# Patient Record
Sex: Male | Born: 1940 | Race: White | Hispanic: No | Marital: Married | Smoking: Never smoker
Health system: Southern US, Community
[De-identification: ages and names within clinical notes are randomized; demographics above are authoritative.]

## PROBLEM LIST (undated history)

## (undated) DIAGNOSIS — N419 Inflammatory disease of prostate, unspecified: Secondary | ICD-10-CM

## (undated) DIAGNOSIS — N323 Diverticulum of bladder: Secondary | ICD-10-CM

## (undated) DIAGNOSIS — C439 Malignant melanoma of skin, unspecified: Secondary | ICD-10-CM

## (undated) DIAGNOSIS — IMO0001 Reserved for inherently not codable concepts without codable children: Secondary | ICD-10-CM

## (undated) DIAGNOSIS — E119 Type 2 diabetes mellitus without complications: Secondary | ICD-10-CM

## (undated) DIAGNOSIS — C801 Malignant (primary) neoplasm, unspecified: Secondary | ICD-10-CM

## (undated) DIAGNOSIS — N486 Induration penis plastica: Secondary | ICD-10-CM

## (undated) DIAGNOSIS — G250 Essential tremor: Secondary | ICD-10-CM

## (undated) DIAGNOSIS — Z794 Long term (current) use of insulin: Secondary | ICD-10-CM

## (undated) DIAGNOSIS — E785 Hyperlipidemia, unspecified: Secondary | ICD-10-CM

## (undated) DIAGNOSIS — I2510 Atherosclerotic heart disease of native coronary artery without angina pectoris: Secondary | ICD-10-CM

## (undated) HISTORY — DX: Hyperlipidemia, unspecified: E78.5

## (undated) HISTORY — DX: Atherosclerotic heart disease of native coronary artery without angina pectoris: I25.10

## (undated) HISTORY — DX: Diverticulum of bladder: N32.3

## (undated) HISTORY — DX: Type 2 diabetes mellitus without complications: E11.9

## (undated) HISTORY — DX: Reserved for inherently not codable concepts without codable children: IMO0001

## (undated) HISTORY — DX: Inflammatory disease of prostate, unspecified: N41.9

## (undated) HISTORY — DX: Long term (current) use of insulin: Z79.4

## (undated) HISTORY — DX: Induration penis plastica: N48.6

## (undated) HISTORY — DX: Essential tremor: G25.0

---

## 1971-12-12 HISTORY — PX: CHOLECYSTECTOMY: SHX55

## 2000-04-30 ENCOUNTER — Encounter: Payer: Self-pay | Admitting: Cardiothoracic Surgery

## 2000-05-01 ENCOUNTER — Encounter: Payer: Self-pay | Admitting: Cardiothoracic Surgery

## 2000-05-01 ENCOUNTER — Inpatient Hospital Stay (HOSPITAL_COMMUNITY): Admission: RE | Admit: 2000-05-01 | Discharge: 2000-05-08 | Payer: Self-pay | Admitting: Cardiothoracic Surgery

## 2000-05-02 ENCOUNTER — Encounter: Payer: Self-pay | Admitting: Cardiothoracic Surgery

## 2000-05-03 ENCOUNTER — Encounter: Payer: Self-pay | Admitting: Cardiothoracic Surgery

## 2000-05-04 ENCOUNTER — Encounter: Payer: Self-pay | Admitting: Cardiothoracic Surgery

## 2000-05-07 ENCOUNTER — Encounter: Payer: Self-pay | Admitting: Cardiothoracic Surgery

## 2006-10-12 ENCOUNTER — Ambulatory Visit: Payer: Self-pay | Admitting: Internal Medicine

## 2006-11-12 ENCOUNTER — Ambulatory Visit: Payer: Self-pay | Admitting: Urology

## 2007-05-28 ENCOUNTER — Ambulatory Visit: Payer: Self-pay | Admitting: Urology

## 2009-12-11 LAB — HM DIABETES EYE EXAM

## 2009-12-31 ENCOUNTER — Ambulatory Visit: Payer: Self-pay | Admitting: Urology

## 2010-04-10 HISTORY — PX: CORONARY ARTERY BYPASS GRAFT: SHX141

## 2010-06-15 ENCOUNTER — Ambulatory Visit: Payer: Self-pay | Admitting: Urology

## 2010-06-28 ENCOUNTER — Ambulatory Visit: Payer: Self-pay | Admitting: Urology

## 2010-06-30 LAB — PATHOLOGY REPORT

## 2011-03-15 ENCOUNTER — Ambulatory Visit: Payer: Self-pay | Admitting: Urology

## 2011-03-21 ENCOUNTER — Ambulatory Visit: Payer: Self-pay | Admitting: Urology

## 2011-08-23 ENCOUNTER — Other Ambulatory Visit (INDEPENDENT_AMBULATORY_CARE_PROVIDER_SITE_OTHER): Payer: Medicare Other

## 2011-08-23 ENCOUNTER — Ambulatory Visit (INDEPENDENT_AMBULATORY_CARE_PROVIDER_SITE_OTHER): Payer: BLUE CROSS/BLUE SHIELD | Admitting: Endocrinology

## 2011-08-23 ENCOUNTER — Encounter: Payer: Self-pay | Admitting: Endocrinology

## 2011-08-23 VITALS — BP 112/60 | HR 57 | Temp 97.5°F | Ht 71.5 in | Wt 182.0 lb

## 2011-08-23 DIAGNOSIS — E119 Type 2 diabetes mellitus without complications: Secondary | ICD-10-CM

## 2011-08-23 DIAGNOSIS — N529 Male erectile dysfunction, unspecified: Secondary | ICD-10-CM

## 2011-08-23 DIAGNOSIS — Z79899 Other long term (current) drug therapy: Secondary | ICD-10-CM

## 2011-08-23 DIAGNOSIS — E7800 Pure hypercholesterolemia, unspecified: Secondary | ICD-10-CM

## 2011-08-23 DIAGNOSIS — E118 Type 2 diabetes mellitus with unspecified complications: Secondary | ICD-10-CM | POA: Insufficient documentation

## 2011-08-23 DIAGNOSIS — E785 Hyperlipidemia, unspecified: Secondary | ICD-10-CM | POA: Insufficient documentation

## 2011-08-23 LAB — HEPATIC FUNCTION PANEL
ALT: 24 U/L (ref 0–53)
Albumin: 4.2 g/dL (ref 3.5–5.2)
Bilirubin, Direct: 0.1 mg/dL (ref 0.0–0.3)
Total Protein: 6.7 g/dL (ref 6.0–8.3)

## 2011-08-23 LAB — LIPID PANEL
Cholesterol: 166 mg/dL (ref 0–200)
HDL: 45.8 mg/dL (ref >39.00)
LDL Cholesterol: 95 mg/dL (ref 0–99)
Triglycerides: 128 mg/dL (ref 0.0–149.0)
VLDL: 25.6 mg/dL (ref 0.0–40.0)

## 2011-08-23 LAB — BASIC METABOLIC PANEL
BUN: 24 mg/dL — ABNORMAL HIGH (ref 6–23)
Calcium: 9.1 mg/dL (ref 8.4–10.5)
Creatinine, Ser: 1 mg/dL (ref 0.4–1.5)
GFR: 80.14 mL/min (ref >60.00)

## 2011-08-23 LAB — MICROALBUMIN / CREATININE URINE RATIO
Creatinine,U: 103.1 mg/dL
Microalb Creat Ratio: 1.1 mg/g (ref 0.0–30.0)
Microalb, Ur: 1.1 mg/dL (ref 0.0–1.9)

## 2011-08-23 LAB — HEMOGLOBIN A1C: Hgb A1c MFr Bld: 6.9 % — ABNORMAL HIGH (ref 4.6–6.5)

## 2011-08-23 NOTE — Progress Notes (Signed)
Subjective:    Patient ID: [name] [name], male    DOB: 02-06-1941, 70 y.o.   MRN: [id number]  HPI pt states 28 years h/o dm, complicated by cad.  he has been on insulin since dx.  he takes a varying dosage of nph insulin.  He says cbg's are approx 90 units in am.  He says he last had a1c was 2 years ago.  He has a renewed interest in getting cbg better, in view of a recurrent bladder diverticulum.  pt says his diet and exercise are very good, but he has otherwise paid less attention to his diabetes recently.   He reports few mos of slight fullness sensation at the bladder area, but no assoc hematuria. He stopped lipitor, due to myalgias. Past Medical History  Diagnosis Date  . Insulin dependent diabetes mellitus   . Diabetes mellitus   . Arteriosclerotic coronary artery disease   . Hyperlipidemia   . Chronic lower back pain   . Peyronie's disease   . Prostatitis   . Benign essential tremor     Past Surgical History  Procedure Date  . Cholecystectomy 1973  . 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 Marital Status: Married    Spouse Name: N/A    Number of Children: N/A  . Years of Education: 16   Occupational History  . Merchant    Social History Main Topics  . Smoking status: Never Smoker   . Smokeless tobacco: Not on file  . Alcohol Use: Yes  . Drug Use: No  . Sexually Active: Not on file   Other Topics Concern  . Not on file   Social History Narrative   Regular exercise-yes    No current outpatient prescriptions on file prior to visit.    Allergies  Allergen Reactions  . Codeine     Family History  Problem Relation Age of Onset  . Arthritis Mother   . Heart disease Mother   . Heart attack Mother   . Diabetes Sister   . Heart disease Brother   . Diabetes Brother     BP 112/60  Pulse 57  Temp(Src) 97.5 F (36.4 C) (Oral)  Ht 5' 11.5" (1.816 m)  Wt 182 lb (82.555 kg)  BMI 25.03 kg/m2  SpO2  97%    Review of Systems denies weight loss, blurry vision, headache, chest pain, sob, n/v, cramps, excessive diaphoresis, memory loss, depression, hypoglycemia, rhinorrhea, and easy bruising.  He reports erectile dysfunction.     Objective:   Physical Exam VS: see vs page GEN: no distress HEAD: head: no deformity eyes: no periorbital swelling, no proptosis external nose and ears are normal mouth: no lesion seen NECK: supple, thyroid is not enlarged CHEST WALL: no deformity.  Healed median sternotomy scar. LUNGS: clear to auscultation CV: reg rate and rhythm, no murmur ABD: abdomen is soft, nontender.  no hepatosplenomegaly.  not distended.  no hernia MUSCULOSKELETAL: muscle bulk and strength are grossly normal.  no obvious joint swelling.  gait is normal and steady EXTEMITIES: no deformity.  no ulcer on the feet.  feet are of normal color and temp.  1+ leg edema on the left, and trace on the right.  There are bilat leg varicosities. PULSES: dorsalis pedis intact bilat.  no carotid bruit NEURO:  cn 2-12 grossly intact.   readily moves all 4's.  sensation is intact to touch on the  feet SKIN:  Normal texture and temperature.  No rash or suspicious lesion is visible.   NODES:  None palpable at the neck PSYCH: alert, oriented x3.  Does not appear anxious nor depressed.  Lab Results  Component Value Date   CHOL 166 08/23/2011   Lab Results  Component Value Date   HDL 45.80 08/23/2011   Lab Results  Component Value Date   LDLCALC 95 08/23/2011   Lab Results  Component Value Date   TRIG 128.0 08/23/2011   Lab Results  Component Value Date   CHOLHDL 4 08/23/2011    Lab Results  Component Value Date   TESTOSTERONE 313.66* 08/23/2011      Assessment & Plan:  Dm, overcontrolled Cad: due to this, risk to his health from hypoglycemia is severe Dyslipidemia, therapy is limited by perceived drug intolerance Hypogonadism, mild Erectile dysfunction, prob not due to  hypogonadism Fullness sensation at the bladder area, uncertain etiology

## 2011-08-23 NOTE — Patient Instructions (Addendum)
good diet and exercise habits significanly improve the control of your diabetes.  please let me know if you wish to be referred to a dietician.  high blood sugar is very risky to your health.  you should see an eye doctor every year. controlling your blood pressure and cholesterol drastically reduces the damage diabetes does to your body.  this also applies to quitting smoking.  please discuss these with your doctor.  you should take an aspirin every day, unless you have been advised by a doctor not to. check your blood sugar 2 times a day.  vary the time of day when you check, between before the 3 meals, and at bedtime.  also check if you have symptoms of your blood sugar being too high or too low.  please keep a record of the readings and bring it to your next appointment here.  please call us sooner if you are having low blood sugar episodes.   we will need to take this complex situation in stages.  blood tests are being requested for you today.  please call [phone] to hear your test results.  You will be prompted to enter the 9-digit "MRN" number that appears at the top left of this page, followed by #.  Then you will hear the message. pending the test results, please take your insulin 25 units each morning. Please see dr lamb soon, to follow-up on you health. Please make a follow-up appointment in 2-3 weeks (update: i left message on phone-tree:  Reduce insulin to 20 units qd.  Despite pretty good LDL, you should take statin rx.  Call if you want levitra rx).

## 2011-09-04 ENCOUNTER — Ambulatory Visit: Payer: Self-pay | Admitting: Urology

## 2011-09-06 ENCOUNTER — Encounter: Payer: Self-pay | Admitting: Endocrinology

## 2011-09-06 ENCOUNTER — Ambulatory Visit (INDEPENDENT_AMBULATORY_CARE_PROVIDER_SITE_OTHER): Payer: Medicare Other | Admitting: Endocrinology

## 2011-09-06 DIAGNOSIS — E109 Type 1 diabetes mellitus without complications: Secondary | ICD-10-CM

## 2011-09-06 MED ORDER — INSULIN NPH ISOPHANE & REGULAR (70-30) 100 UNIT/ML ~~LOC~~ SUSP: SUBCUTANEOUS | Status: DC

## 2011-09-06 NOTE — Patient Instructions (Addendum)
check your blood sugar 2 times a day.  vary the time of day when you check, between before the 3 meals, and at bedtime.  also check if you have symptoms of your blood sugar being too high or too low.  please keep a record of the readings and bring it to your next appointment here.  please call us sooner if you are having low blood sugar episodes.   Please make a follow-up appointment in 1 month. chenge nph insulin to 70/30 insulin: 20 units with breakfast, and 5 with the evening meal.  However, if you are going to be active, take just 15 units in the morning.

## 2011-09-06 NOTE — Progress Notes (Signed)
Subjective:    Patient ID: [name] [name], male    DOB: 1941/03/25, 70 y.o.   MRN: [id number]  HPI Pt often exceeds the prescribed insulin dosage (he often takes 25 units qam, and 10 qpm).  he brings a record of his cbg's which i have reviewed today.  It varies from 74-210.  It is in general higher as the day goes on.  He says he will take insulin as often as bid only.    Past Medical History  Diagnosis Date  . Insulin dependent diabetes mellitus   . Arteriosclerotic coronary artery disease   . Chronic lower back pain   . Peyronie's disease   . Prostatitis   . Benign essential tremor   . Hyperlipidemia   . Diabetes mellitus     Past Surgical History  Procedure Date  . Cholecystectomy 1973  . 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 Marital Status: Married    Spouse Name: N/A    Number of Children: N/A  . Years of Education: 16   Occupational History  . Merchant    Social History Main Topics  . Smoking status: Never Smoker   . Smokeless tobacco: Not on file  . Alcohol Use: Yes  . Drug Use: No  . Sexually Active: Not on file   Other Topics Concern  . Not on file   Social History Narrative   Regular exercise-yes    Current Outpatient Prescriptions on File Prior to Visit  Medication Sig Dispense Refill  . Acetylcarn-Alpha Lipoic Acid 400-200 MG CAPS Take 1 capsule by mouth daily.        . Bilberry, Vaccinium myrtillus, (BILBERRY PO) Take 1 capsule by mouth 2 (two) times daily.        . Biotin 1000 MCG tablet Take 1,000 mcg by mouth 2 (two) times daily.        . Carnosine L POWD by Does not apply route. 500mg   Once daily       . cholecalciferol (VITAMIN D) 1000 UNITS tablet Take 1,000 Units by mouth daily.        . Coenzyme Q10 (EQL COQ10) 300 MG CAPS Take 1 capsule by mouth daily.        . Cranberry (SM CRANBERRY) 300 MG tablet Take 300 mg by mouth 2 (two) times daily.        . Cyanocobalamin (B-12) 100 MCG TABS  Take 1 tablet by mouth daily.        . Digestive Enzymes (PAPAYA ENZYME PO) Take 1 capsule by mouth 2 (two) times daily.        . Evening Primrose Oil 1000 MG CAPS Take 1 capsule by mouth daily.        Marland Kitchen FENUGREEK PO Take 1 capsule by mouth daily. 610mg        . Ginger, Zingiber officinalis, (GINGER ROOT) 550 MG CAPS Take 1 capsule by mouth 2 (two) times daily.        . Glucosamine-Chondroit-Vit C-Mn (GLUCOSAMINE 1500 COMPLEX) CAPS Take 1 capsule by mouth daily.        . Glutamine 500 MG CAPS Take 1 capsule by mouth daily.        . Glutathione-L POWD 250mg   Once daily       . Hyaluronic Acid 400-80-40 MG CAPS Take 1 capsule by mouth 2 (two) times daily. 1000mg        . [place] Lanius  300 MG CAPS Take 1 capsule by mouth 2 (two) times daily.        . Magnesium 400 MG CAPS Take 1 capsule by mouth 2 (two) times daily.        . milk thistle 175 MG tablet Take 175 mg by mouth daily.        . NON FORMULARY Tumeric  500mg  1 by mouth daily       . Nutritional Supplements (MELATONIN PO) Take 1 capsule by mouth daily.        . primidone (MYSOLINE) 50 MG tablet 25 mg by mouth daily       . valsartan-hydrochlorothiazide (DIOVAN-HCT) 80-12.5 MG per tablet Take 1 tablet by mouth daily.          Allergies  Allergen Reactions  . Codeine     Family History  Problem Relation Age of Onset  . Arthritis Mother   . Heart disease Mother   . Heart attack Mother   . Diabetes Sister   . Heart disease Brother   . Diabetes Brother     BP 108/52  Pulse 60  Temp(Src) 97.8 F (36.6 C) (Oral)  Ht 5' 11.5" (1.816 m)  Wt 183 lb 6.4 oz (83.19 kg)  BMI 25.22 kg/m2  SpO2 96% Review of Systems denies hypoglycemia    Objective:   Physical Exam VITAL SIGNS:  See vs page GENERAL: no distress SKIN:  Insulin injection sites at the anterior abdomen are normal     Assessment & Plan:  Dm.  The pattern of his cbg's indicates he needs some adjustment in his therapy. therapy limited by pt's request for a simple  insulin regimen

## 2011-09-13 ENCOUNTER — Inpatient Hospital Stay: Payer: Self-pay | Admitting: Urology

## 2011-10-11 ENCOUNTER — Ambulatory Visit: Payer: Medicare Other | Admitting: Endocrinology

## 2012-06-26 ENCOUNTER — Ambulatory Visit: Payer: Self-pay | Admitting: Orthopedic Surgery

## 2013-10-23 ENCOUNTER — Ambulatory Visit: Payer: Self-pay | Admitting: Gastroenterology

## 2013-11-21 ENCOUNTER — Ambulatory Visit: Payer: Self-pay | Admitting: Urology

## 2013-11-24 ENCOUNTER — Ambulatory Visit: Payer: Self-pay | Admitting: Urology

## 2013-12-01 ENCOUNTER — Ambulatory Visit: Payer: Self-pay | Admitting: Gastroenterology

## 2013-12-18 ENCOUNTER — Ambulatory Visit: Payer: Self-pay | Admitting: Urology

## 2013-12-26 ENCOUNTER — Ambulatory Visit: Payer: Self-pay | Admitting: Urology

## 2014-04-29 ENCOUNTER — Ambulatory Visit: Payer: Self-pay | Admitting: Urology

## 2014-10-02 DIAGNOSIS — I779 Disorder of arteries and arterioles, unspecified: Secondary | ICD-10-CM | POA: Insufficient documentation

## 2014-10-02 DIAGNOSIS — I739 Peripheral vascular disease, unspecified: Secondary | ICD-10-CM

## 2015-03-23 ENCOUNTER — Encounter
Admit: 2015-03-23 | Disposition: A | Discharge status: Home / Self Care | Payer: Self-pay | Attending: Surgery | Admitting: Surgery

## 2015-04-20 ENCOUNTER — Encounter: Payer: Medicare Other | Attending: Surgery | Admitting: Surgery

## 2015-04-20 DIAGNOSIS — L97421 Non-pressure chronic ulcer of left heel and midfoot limited to breakdown of skin: Secondary | ICD-10-CM | POA: Diagnosis present

## 2015-04-20 DIAGNOSIS — E11621 Type 2 diabetes mellitus with foot ulcer: Secondary | ICD-10-CM | POA: Diagnosis not present

## 2015-04-20 NOTE — Progress Notes (Signed)
TZION, WEDEL ([id number]) Visit Report for 04/20/2015 Chief Complaint Document Details Patient Name: [name] [name], [name] [name] Date of Service: 04/20/2015 8:45 AM Medical Record Number: [id number] Patient Account Number: [id number] Date of Birth/Sex: 07-23-1941 (74 y.o. Male) Treating RN: Junious Dresser Primary Care Physician: Apolonio Schneiders Other Clinician: Referring Physician: Apolonio Schneiders Treating Physician/Extender: Frann Rider in Treatment: 4 Information Obtained from: Patient Chief Complaint Patient presents to the wound care center for a consult due non healing wound pleasant 74 year old gentleman who comes with a ulcerated area on the left lateral heel and he's had it for about a month. Electronic Signature(s) Signed: 04/20/2015 2:25:04 PM By: Christin Fudge MD, FACS Entered By: Christin Fudge on 04/20/2015 09:30:01 [name] ([id number]) -------------------------------------------------------------------------------- Debridement Details Patient Name: [name] Date of Service: 04/20/2015 8:45 AM Medical Record Number: [id number] Patient Account Number: [id number] Date of Birth/Sex: Oct 28, 1941 (74 y.o. Male) Treating RN: Junious Dresser Primary Care Physician: Apolonio Schneiders Other Clinician: Referring Physician: Apolonio Schneiders Treating Physician/Extender: Frann Rider in Treatment: 4 Debridement Performed for Wound #1 Left,Lateral Calcaneous Assessment: Performed By: Physician Pat Patrick., MD Debridement: Open Wound/Selective Debridement Selective Description: Pre-procedure Yes Verification/Time Out Taken: Start Time: 09:23 Pain Control: Lidocaine 4% Topical Solution Level: Non-Viable Tissue Total Area Debrided (L x 1.3 (cm) x 0.2 (cm) = 0.26 (cm) W): Tissue and other Non-Viable, Eschar, Fibrin/Slough, Skin material debrided: Instrument: Forceps, Scissors Bleeding: None End Time: 09:25 Procedural Pain: 0 Post Procedural Pain: 0 Response to  Treatment: Procedure was tolerated well Post Debridement Measurements of Total Wound Length: (cm) 1.3 Width: (cm) 0.2 Depth: (cm) 0.1 Volume: (cm) 0.02 Electronic Signature(s) Signed: 04/20/2015 2:25:04 PM By: Christin Fudge MD, FACS Signed: 04/20/2015 4:31:45 PM By: Junious Dresser RN Entered By: Christin Fudge on 04/20/2015 09:29:51 [name] ([id number]) -------------------------------------------------------------------------------- HPI Details Patient Name: [name] Date of Service: 04/20/2015 8:45 AM Medical Record Number: [id number] Patient Account Number: [id number] Date of Birth/Sex: 1941/10/11 (74 y.o. Male) Treating RN: Junious Dresser Primary Care Physician: Apolonio Schneiders Other Clinician: Referring Physician: Apolonio Schneiders Treating Physician/Extender: Frann Rider in Treatment: 4 History of Present Illness HPI Description: 74 year old gentleman who comes as a self-referral and has been known to be a diabetic on treatment for the last 30 years and takes insulin for this. Developed a callus on the left lateral heel and does not know exactly what type of footwear cause this. Did see his dermatologist but prescribed some medication for him and also applied some Bactroban cream. Able to soften this area and remove some of the dead skin but he now has a definite ulcer there and knowing he's been a diabetic he came as a self-referral for further treatment. He's been a nonsmoker all his life and hasn't had no problems with any arterial disease and has no workup done for this. 04/06/2015 -- he has been doing his dressing on alternate days but sometimes he runs that the dressing doesn't stay on for long. He also informs me that he is going to be out on vacation this weekend and will be back only in 2 weeks. Electronic Signature(s) Signed: 04/20/2015 2:25:04 PM By: Christin Fudge MD, FACS Entered By: Christin Fudge on 04/20/2015 09:30:07 [name]  ([id number]) -------------------------------------------------------------------------------- Physical Exam Details Patient Name: [name] Date of Service: 04/20/2015 8:45 AM Medical Record Number: [id number] Patient Account Number: [id number] Date of Birth/Sex: 11/11/1941 (74 y.o. Male) Treating RN: Junious Dresser Primary Care Physician: Apolonio Schneiders Other Clinician: Referring Physician: Apolonio Schneiders  Treating Physician/Extender: Christin Fudge Weeks in Treatment: 4 Constitutional . Pulse regular. Respirations normal and unlabored. Afebrile. . Eyes Nonicteric. Reactive to light. Ears, Nose, Mouth, and Throat Lips, teeth, and gums WNL.Marland Kitchen Moist mucosa without lesions . Neck supple and nontender. No palpable supraclavicular or cervical adenopathy. Normal sized without goiter. Respiratory WNL. No retractions.. Cardiovascular Pedal Pulses WNL. No clubbing, cyanosis or edema. Integumentary (Hair, Skin) the ulceration is a epithelializing nicely but he's got a little bit of maceration and some dead skin and callus around it which will be sharply debrided today.Marland Kitchen No crepitus or fluctuance. No peri-wound warmth or erythema. No masses.Marland Kitchen Psychiatric Judgement and insight Intact.. No evidence of depression, anxiety, or agitation.. Electronic Signature(s) Signed: 04/20/2015 2:25:04 PM By: Christin Fudge MD, FACS Entered By: Christin Fudge on 04/20/2015 09:31:37 [name] ([id number]) -------------------------------------------------------------------------------- Physician Orders Details Patient Name: [name] Date of Service: 04/20/2015 8:45 AM Medical Record Number: [id number] Patient Account Number: [id number] Date of Birth/Sex: 1941-07-31 (74 y.o. Male) Treating RN: Junious Dresser Primary Care Physician: Apolonio Schneiders Other Clinician: Referring Physician: Apolonio Schneiders Treating Physician/Extender: Frann Rider in Treatment: 4 Verbal / Phone Orders: Yes Clinician:  Junious Dresser Read Back and Verified: Yes Diagnosis Coding Wound Cleansing Wound #1 Left,Lateral Calcaneous o Clean wound with Normal Saline. Anesthetic Wound #1 Left,Lateral Calcaneous o Topical Lidocaine 4% cream applied to wound bed prior to debridement Primary Wound Dressing Wound #1 Left,Lateral Calcaneous o Aquacel Ag Secondary Dressing Wound #1 Left,Lateral Calcaneous o Gauze and Kerlix/Conform Dressing Change Frequency Wound #1 Left,Lateral Calcaneous o Change dressing every other day. Follow-up Appointments Wound #1 Left,Lateral Calcaneous o Return Appointment in 1 week. Electronic Signature(s) Signed: 04/20/2015 2:25:04 PM By: Christin Fudge MD, FACS Signed: 04/20/2015 4:31:45 PM By: Junious Dresser RN Entered By: Junious Dresser on 04/20/2015 09:23:11 [name] ([id number]) -------------------------------------------------------------------------------- Problem List Details Patient Name: [name] Date of Service: 04/20/2015 8:45 AM Medical Record Number: [id number] Patient Account Number: [id number] Date of Birth/Sex: 09-06-1941 (74 y.o. 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 Male) Treating RN: Junious Dresser Primary Care Physician: Apolonio Schneiders Other Clinician: Referring Physician: Apolonio Schneiders Treating Physician/Extender: Frann Rider in Treatment: 4 Subjective Chief Complaint Information obtained from Patient Patient presents to the wound care center for a consult due non healing wound pleasant 74 year old gentleman who comes with a ulcerated area on the left lateral heel and he's had it for about a month. History of Present Illness (HPI) 74 year old gentleman who comes as a self-referral and has been known to be a diabetic on treatment for the last 30 years and takes insulin for this. Developed a callus on the left lateral heel and does not know exactly what type of footwear cause this. Did see his dermatologist but prescribed some medication for him and also applied some Bactroban cream. Able to soften this area and remove some  of the dead skin but he now has a definite ulcer there and knowing he's been a diabetic he came as a self-referral for further treatment. He's been a nonsmoker all his life and hasn't had no problems with any arterial disease and has no workup done for this. 04/06/2015 -- he has been doing his dressing on alternate days but sometimes he runs that the dressing doesn't stay on for long. He also informs me that he is going to be out on vacation this weekend and will be back only in 2 weeks. Objective Constitutional Pulse regular. Respirations normal and unlabored. Afebrile. Vitals Time Taken: 9:10 AM, Height: 71 in, Weight: 180 lbs, BMI: 25.1, Temperature: 97.3 F, Pulse: 60 bpm, Respiratory Rate: 20 breaths/min, Blood Pressure: 109/57 mmHg. Eyes Nonicteric. Reactive to light. Ears, Nose, Mouth, and Throat Lips, teeth, and gums WNL.Marland Kitchen Moist mucosa without lesions . YERICK, EGGEBRECHT. ([id number]) Neck supple and nontender. No palpable  supraclavicular or cervical adenopathy. Normal sized without goiter. Respiratory WNL. No retractions.. Cardiovascular Pedal Pulses WNL. No clubbing, cyanosis or edema. Psychiatric Judgement and insight Intact.. No evidence of depression, anxiety, or agitation.. Integumentary (Hair, Skin) the ulceration is a epithelializing nicely but he's got a little bit of maceration and some dead skin and callus around it which will be sharply debrided today.Marland Kitchen No crepitus or fluctuance. No peri-wound warmth or erythema. No masses.. Wound #1 status is Open. Original cause of wound was Gradually Appeared. The wound is located on the Left,Lateral Calcaneous. The wound measures 1.3cm length x 0.2cm width x 0.1cm depth; 0.204cm^2 area and 0.02cm^3 volume. The wound is limited to skin breakdown. There is no tunneling or undermining noted. There is a small amount of serous drainage noted. The wound margin is flat and intact. There is small (1-33%) red, pink granulation within the wound bed. There is no necrotic tissue within the wound bed. The periwound skin appearance exhibited: Callus, Maceration. The periwound skin appearance did not exhibit: Crepitus, Excoriation, Fluctuance, Friable, Induration, Localized Edema, Rash, Scarring, Dry/Scaly, Moist, Atrophie Blanche, Cyanosis, Ecchymosis, Hemosiderin Staining, Mottled, Pallor, Rubor, Erythema. Periwound temperature was noted as No Abnormality. 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 ([id number]) I'm seeing him back after 2 weeks due to his vacation and he has done very well. The wound is progressing nicely and there is some healthy granulation tissue.  The callous and debris is minimal and this was sharply debrided today. I will continue with silver alginate dressing on alternate days and offloading as he has been doing. He will come back and see me next week. Procedures Wound #1 Wound #1 is a Diabetic Wound/Ulcer of the Lower Extremity located on the Left,Lateral Calcaneous . There was a Non-Viable Tissue Open Wound/Selective [phone]) debridement with total area of 0.26 sq cm performed by Tashay Bozich, Jackson Latino., MD. with the following instrument(s): Forceps and Scissors to remove Non- Viable tissue/material including Fibrin/Slough, Eschar, and Skin after achieving pain control using Lidocaine 4% Topical Solution. A time out was conducted prior to the start of the procedure. There was no bleeding. The procedure was tolerated well with a pain level of 0 throughout and a pain level of 0 following the procedure. Post Debridement Measurements: 1.3cm length x 0.2cm  width x 0.1cm depth; 0.02cm^3 volume. Plan Wound Cleansing: Wound #1 Left,Lateral Calcaneous: Clean wound with Normal Saline. Anesthetic: Wound #1 Left,Lateral Calcaneous: Topical Lidocaine 4% cream applied to wound bed prior to debridement Primary Wound Dressing: Wound #1 Left,Lateral Calcaneous: Aquacel Ag Secondary Dressing: Wound #1 Left,Lateral Calcaneous: Gauze and Kerlix/Conform Dressing Change Frequency: Wound #1 Left,Lateral Calcaneous: Change dressing every other day. Follow-up Appointments: Wound #1 Left,Lateral Calcaneous: Return Appointment in 1 week. Follow-Up Appointments: A Patient Clinical Summary of Care was provided to [facility]. ([id number]) I'm seeing him back after 2 weeks due to his vacation and he has done very well. The wound is progressing nicely and there is some healthy granulation tissue. The callous and debris is minimal and this was sharply debrided today. I will continue with silver alginate dressing on alternate days and offloading  as he has been doing. He will come back and see me next week. Electronic Signature(s) Signed: 04/20/2015 1:31:30 PM By: Junious Dresser RN Signed: 04/20/2015 2:25:04 PM By: Christin Fudge MD, FACS Entered By: Junious Dresser on 04/20/2015 13:31:30 [name] ([id number]) -------------------------------------------------------------------------------- SuperBill Details Patient Name: [name] Date of Service: 04/20/2015 Medical Record Number: [id number] Patient Account Number: [id number] Date of Birth/Sex: [birth year] (74 y.o. 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

## 2015-04-20 NOTE — Progress Notes (Signed)
[name] [name] ([id number]) Visit Report for 04/20/2015 Arrival Information Details Patient Name: [name] [name] Date of Service: 04/20/2015 8:45 AM Medical Record Number: [id number] Patient Account Number: [id number] Date of Birth/Sex: May 30, 1941 (74 y.o. Male) Treating RN: Junious Dresser Primary Care Physician: Apolonio Schneiders Other Clinician: Referring Physician: Apolonio Schneiders Treating Physician/Extender: Frann Rider in Treatment: 4 Visit Information History Since Last Visit Added or deleted any medications: No Patient Arrived: Ambulatory Any new allergies or adverse reactions: No Arrival Time: 09:12 Had a fall or experienced change in No Accompanied By: self activities of daily living that may affect Transfer Assistance: None risk of falls: Patient Identification Verified: Yes Signs or symptoms of abuse/neglect since last No Secondary Verification Process Yes visito Completed: Hospitalized since last visit: No Patient Has Alerts: Yes Has Dressing in Place as Prescribed: Yes Patient Alerts: Type II Diabetic Pain Present Now: No AIC 6.2 ABI L 1.20 R 1.12 Electronic Signature(s) Signed: 04/20/2015 4:31:45 PM By: Junious Dresser RN Entered By: Junious Dresser on 04/20/2015 09:12:56 [name] [name] ([id number]) -------------------------------------------------------------------------------- Encounter Discharge Information Details Patient Name: [name] [name] Date of Service: 04/20/2015 8:45 AM Medical Record Number: [id number] Patient Account Number: [id number] Date of Birth/Sex: August 29, 1941 (74 y.o. Male) Treating RN: Junious Dresser Primary Care Physician: Apolonio Schneiders Other Clinician: Referring Physician: Apolonio Schneiders Treating Physician/Extender: Frann Rider in Treatment: 4 Encounter Discharge Information Items Schedule Follow-up Appointment: No Medication Reconciliation completed No and provided to Patient/Care [name] [name]: Provided on Clinical Summary of  Care: 04/20/2015 Form Type Recipient Paper Patient RO Electronic Signature(s) Signed: 04/20/2015 9:33:43 AM By: Ruthine Dose Entered By: Ruthine Dose on 04/20/2015 09:33:43 [name] [name] ([id number]) -------------------------------------------------------------------------------- Lower Extremity Assessment Details Patient Name: [name] [name] Date of Service: 04/20/2015 8:45 AM Medical Record Number: [id number] Patient Account Number: [id number] Date of Birth/Sex: 13-Oct-1941 (74 y.o. Male) Treating RN: Junious Dresser Primary Care Physician: Apolonio Schneiders Other Clinician: Referring Physician: Apolonio Schneiders Treating Physician/Extender: Frann Rider in Treatment: 4 Vascular Assessment Claudication: Claudication Assessment [Left:None] Pulses: Posterior Tibial Palpable: [Left:Yes] Dorsalis Pedis Palpable: [Left:Yes] Extremity colors, hair growth, and conditions: Extremity Color: [Left:Normal] Hair Growth on Extremity: [Left:No] Temperature of Extremity: [Left:Warm] Capillary Refill: [Left:< 3 seconds] Dependent Rubor: [Left:No] Blanched when Elevated: [Left:No] Toe Nail Assessment Left: Right: Thick: Yes Discolored: Yes Deformed: Yes Improper Length and Hygiene: No Electronic Signature(s) Signed: 04/20/2015 4:31:45 PM By: Junious Dresser RN Entered By: Junious Dresser on 04/20/2015 09:16:35 [name] [name] ([id number]) -------------------------------------------------------------------------------- Multi Wound Chart Details Patient Name: [name] [name] Date of Service: 04/20/2015 8:45 AM Medical Record Number: [id number] Patient Account Number: [id number] Date of Birth/Sex: 03/07/1941 (74 y.o. Male) Treating RN: Junious Dresser Primary Care Physician: Apolonio Schneiders Other Clinician: Referring Physician: Apolonio Schneiders Treating Physician/Extender: Frann Rider in Treatment: 4 Vital Signs Height(in): 71 Pulse(bpm): 60 Weight(lbs): 180 Blood  Pressure 109/57 (mmHg): Body Mass Index(BMI): 25 Temperature(F): 97.3 Respiratory Rate 20 (breaths/min): Photos: [1:No Photos] [N/A:N/A] Wound Location: [1:Left Calcaneous - Lateral] [N/A:N/A] Wounding Event: [1:Gradually Appeared] [N/A:N/A] Primary Etiology: [1:Diabetic Wound/Ulcer of the Lower Extremity] [N/A:N/A] Comorbid History: [1:Type II Diabetes] [N/A:N/A] Date Acquired: [1:02/12/2015] [N/A:N/A] Weeks of Treatment: [1:4] [N/A:N/A] Wound Status: [1:Open] [N/A:N/A] Measurements L x W x D 1.3x0.2x0.1 [N/A:N/A] (cm) Area (cm) : [1:0.204] [N/A:N/A] Volume (cm) : [1:0.02] [N/A:N/A] % Reduction in Area: [1:76.80%] [N/A:N/A] % Reduction in Volume: 77.30% [N/A:N/A] Classification: [1:Grade 1] [N/A:N/A] Exudate Amount: [1:Small] [N/A:N/A] Exudate Type: [1:Serous] [N/A:N/A] Exudate Color: [1:amber] [N/A:N/A] Wound Margin: [1:Flat and Intact] [N/A:N/A] Granulation Amount: [1:Small (1-33%)] [N/A:N/A]  Granulation Quality: [1:Red, Pink] [N/A:N/A] Necrotic Amount: [1:None Present (0%)] [N/A:N/A] Exposed Structures: [1:Fascia: No Fat: No Tendon: No Muscle: No Joint: No Bone: No] [N/A:N/A] Limited to Skin Breakdown Epithelialization: Large (67-100%) N/A N/A Periwound Skin Texture: Callus: Yes N/A N/A Edema: No Excoriation: No Induration: No Crepitus: No Fluctuance: No Friable: No Rash: No Scarring: No Periwound Skin Maceration: Yes N/A N/A Moisture: Moist: No Dry/Scaly: No Periwound Skin Color: Atrophie Blanche: No N/A N/A Cyanosis: No Ecchymosis: No Erythema: No Hemosiderin Staining: No Mottled: No Pallor: No Rubor: No Temperature: No Abnormality N/A N/A Tenderness on No N/A N/A Palpation: Wound Preparation: Ulcer Cleansing: N/A N/A Rinsed/Irrigated with Saline Topical Anesthetic Applied: Other: lidocaine 4% Treatment Notes Electronic Signature(s) Signed: 04/20/2015 4:31:45 PM By: Junious Dresser RN Entered By: Junious Dresser on 04/20/2015 09:21:34 [name] [name] ([id number]) -------------------------------------------------------------------------------- [place] Details Patient Name: [name] [name] Date of Service: 04/20/2015 8:45 AM Medical Record Number: [id number] Patient Account Number: [id number] Date of Birth/Sex: [birth year] (74 y.o. Male) Treating RN: Junious Dresser Primary Care Physician: Apolonio Schneiders Other Clinician: Referring Physician: Apolonio Schneiders Treating Physician/Extender: Frann Rider in Treatment: 4 Active Inactive Orientation to the Wound Care Program Nursing Diagnoses: Knowledge deficit related to the wound healing center program Goals: Patient/caregiver will verbalize understanding of the [facility] Date Initiated: 03/23/2015 Goal Status: Active Interventions: Provide education on orientation to the wound center Notes: Wound/Skin Impairment Nursing Diagnoses: Knowledge deficit related to ulceration/compromised skin integrity Goals: Patient/caregiver will verbalize understanding of skin care regimen Date Initiated: 03/23/2015 Goal Status: Active Ulcer/skin breakdown will heal within 14 weeks Date Initiated: 03/23/2015 Goal Status: Active Interventions: Assess patient/caregiver ability to obtain necessary supplies Assess patient/caregiver ability to perform ulcer/skin care regimen upon admission and as needed Assess ulceration(s) every visit Provide education on ulcer and skin care Treatment Activities: Skin care regimen initiated : 04/20/2015 Topical wound management initiated : 04/20/2015 WELLES, WALTHALL ([id number]) Notes: Electronic Signature(s) Signed: 04/20/2015 4:31:45 PM By: Junious Dresser RN Entered By: Junious Dresser on 04/20/2015 09:21:12 [name] [name] ([id number]) -------------------------------------------------------------------------------- Pain Assessment Details Patient Name: [name] [name] Date of Service: 04/20/2015 8:45 AM Medical Record  Number: [id number] Patient Account Number: [id number] Date of Birth/Sex: [birth year] (74 y.o. Male) Treating RN: Junious Dresser Primary Care Physician: Apolonio Schneiders Other Clinician: Referring Physician: Apolonio Schneiders Treating Physician/Extender: Frann Rider in Treatment: 4 Active Problems Location of Pain Severity and Description of Pain Patient Has Paino No Site Locations Pain Management and Medication Current Pain Management: Electronic Signature(s) Signed: 04/20/2015 4:31:45 PM By: Junious Dresser RN Entered By: Junious Dresser on 04/20/2015 09:13:02 [name] [name] ([id number]) -------------------------------------------------------------------------------- Patient/Caregiver Education Details Patient Name: [name] [name] Date of Service: 04/20/2015 8:45 AM Medical Record Number: [id number] Patient Account Number: [id number] Date of Birth/Gender: 1941/01/25 (74 y.o. Male) Treating RN: Junious Dresser Primary Care Physician: Apolonio Schneiders Other Clinician: Referring Physician: Apolonio Schneiders Treating Physician/Extender: Frann Rider in Treatment: 4 Education Assessment Education Provided To: Patient Education Topics Provided Offloading: Methods: Explain/Verbal Responses: State content correctly Pressure: Methods: Explain/Verbal Responses: State content correctly Safety: Methods: Explain/Verbal Responses: State content correctly Wound Debridement: Methods: Explain/Verbal Responses: State content correctly Wound/Skin Impairment: Methods: Explain/Verbal Responses: State content correctly Electronic Signature(s) Signed: 04/20/2015 4:31:45 PM By: Junious Dresser RN Entered By: Junious Dresser on 04/20/2015 09:40:10 [name] [name] ([id number]) -------------------------------------------------------------------------------- Wound Assessment Details Patient Name: [name] [name] Date of Service: 04/20/2015 8:45 AM Medical Record Number: [id number] Patient Account Number:  [id number] Date of Birth/Sex: 01/30/1941 (73  y.o. Male) Treating RN: Junious Dresser Primary Care Physician: Apolonio Schneiders Other Clinician: Referring Physician: Apolonio Schneiders Treating Physician/Extender: Frann Rider in Treatment: 4 Wound Status Wound Number: 1 Primary Diabetic Wound/Ulcer of the Lower Etiology: Extremity Wound Location: Left Calcaneous - Lateral Wound Status: Open Wounding Event: Gradually Appeared Comorbid Type II Diabetes Date Acquired: 02/12/2015 History: Weeks Of Treatment: 4 Clustered Wound: No Photos Photo Uploaded By: Junious Dresser on 04/20/2015 13:40:57 Wound Measurements Length: (cm) 1.3 Width: (cm) 0.2 Depth: (cm) 0.1 Area: (cm) 0.204 Volume: (cm) 0.02 % Reduction in Area: 76.8% % Reduction in Volume: 77.3% Epithelialization: Large (67-100%) Tunneling: No Undermining: No Wound Description Classification: Grade 1 Wound Margin: Flat and Intact Exudate Amount: Small Exudate Type: Serous Exudate Color: amber WILBURT, MESSINA. ([id number]) Foul Odor After Cleansing: No Wound Bed Granulation Amount: Small (1-33%) Exposed Structure Granulation Quality: Red, Pink Fascia Exposed: No Necrotic Amount: None Present (0%) Fat Layer Exposed: No Tendon Exposed: No Muscle Exposed: No Joint Exposed: No Bone Exposed: No Limited to Skin Breakdown Periwound Skin Texture Texture Color No Abnormalities Noted: No No Abnormalities Noted: No Callus: Yes Atrophie Blanche: No Crepitus: No Cyanosis: No Excoriation: No Ecchymosis: No Fluctuance: No Erythema: No Friable: No Hemosiderin Staining: No Induration: No Mottled: No Localized Edema: No Pallor: No Rash: No Rubor: No Scarring: No Temperature / Pain Moisture Temperature: No Abnormality No Abnormalities Noted: No Dry / Scaly: No Maceration: Yes Moist: No Wound Preparation Ulcer Cleansing: Rinsed/Irrigated with Saline Topical Anesthetic Applied: Other: lidocaine 4%, Treatment  Notes Wound #1 (Left, Lateral Calcaneous) 1. Cleansed with: Clean wound with Normal Saline 2. Anesthetic Topical Lidocaine 4% cream to wound bed prior to debridement 4. Dressing Applied: Aquacel Ag 5. Secondary Dressing Applied Gauze and Kerlix/Conform Electronic Signature(s) Signed: 04/20/2015 4:31:45 PM By: Junious Dresser RN Faith Rogue, Kinsley W. ([id number]) Entered By: Junious Dresser on 04/20/2015 09:17:04 [name] [name] ([id number]) -------------------------------------------------------------------------------- Vitals Details Patient Name: [name] [name] Date of Service: 04/20/2015 8:45 AM Medical Record Number: [id number] Patient Account Number: [id number] Date of Birth/Sex: June 21, 1941 (74 y.o. Male) Treating RN: Junious Dresser Primary Care Physician: Apolonio Schneiders Other Clinician: Referring Physician: Apolonio Schneiders Treating Physician/Extender: Frann Rider in Treatment: 4 Vital Signs Time Taken: 09:10 Temperature (F): 97.3 Height (in): 71 Pulse (bpm): 60 Weight (lbs): 180 Respiratory Rate (breaths/min): 20 Body Mass Index (BMI): 25.1 Blood Pressure (mmHg): 109/57 Reference Range: 80 - 120 mg / dl Electronic Signature(s) Signed: 04/20/2015 4:31:45 PM By: Junious Dresser RN Entered By: Junious Dresser on 04/20/2015 09:13:25

## 2015-04-27 ENCOUNTER — Encounter: Payer: Medicare Other | Admitting: Surgery

## 2015-04-27 DIAGNOSIS — L97421 Non-pressure chronic ulcer of left heel and midfoot limited to breakdown of skin: Secondary | ICD-10-CM | POA: Diagnosis not present

## 2015-04-28 NOTE — Progress Notes (Signed)
EDMAN, LIPSEY ([id number]) Visit Report for 04/27/2015 Chief Complaint Document Details Patient Name: [name] [name], [name] [name] Date of Service: 04/27/2015 8:45 AM Medical Record Number: [id number] Patient Account Number: [id number] Date of Birth/Sex: Apr 13, 1941 (74 y.o. Male) Treating RN: Primary Care Physician: Apolonio Schneiders Other Clinician: Referring Physician: Apolonio Schneiders Treating Physician/Extender: Frann Rider in Treatment: 5 Information Obtained from: Patient Chief Complaint Patient presents to the wound care center for a consult due non healing wound pleasant 74 year old gentleman who comes with a ulcerated area on the left lateral heel and he's had it for about a month. Electronic Signature(s) Signed: 04/27/2015 12:54:03 PM By: Christin Fudge MD, FACS Entered By: Christin Fudge on 04/27/2015 09:35:39 [name] [name] ([id number]) -------------------------------------------------------------------------------- HPI Details Patient Name: [name] [name] Date of Service: 04/27/2015 8:45 AM Medical Record Number: [id number] Patient Account Number: [id number] Date of Birth/Sex: 04-17-1941 (74 y.o. Male) Treating RN: Primary Care Physician: Apolonio Schneiders Other Clinician: Referring Physician: Apolonio Schneiders Treating Physician/Extender: Frann Rider in Treatment: 5 History of Present Illness HPI Description: 74 year old gentleman who comes as a self-referral and has been known to be a diabetic on treatment for the last 30 years and takes insulin for this. Developed a callus on the left lateral heel and does not know exactly what type of footwear cause this. Did see his dermatologist but prescribed some medication for him and also applied some Bactroban cream. Able to soften this area and remove some of the dead skin but he now has a definite ulcer there and knowing he's been a diabetic he came as a self-referral for further treatment. He's been a nonsmoker all his life and hasn't had  no problems with any arterial disease and has no workup done for this. 04/06/2015 -- he has been doing his dressing on alternate days but sometimes he runs that the dressing doesn't stay on for long. He also informs me that he is going to be out on vacation this weekend and will be back only in 2 weeks. 04/27/2015 -- his blood sugars under good control and he says he's been offloading as much as possible and has no pain whatsoever. Electronic Signature(s) Signed: 04/27/2015 12:54:03 PM By: Christin Fudge MD, FACS Entered By: Christin Fudge on 04/27/2015 09:36:05 [name] [name] ([id number]) -------------------------------------------------------------------------------- Physical Exam Details Patient Name: [name] [name] Date of Service: 04/27/2015 8:45 AM Medical Record Number: [id number] Patient Account Number: [id number] Date of Birth/Sex: May 08, 1941 (74 y.o. Male) Treating RN: Primary Care Physician: Apolonio Schneiders Other Clinician: Referring Physician: Apolonio Schneiders Treating Physician/Extender: Frann Rider in Treatment: 5 Constitutional . Pulse regular. Respirations normal and unlabored. Afebrile. . Eyes Nonicteric. Reactive to light. Ears, Nose, Mouth, and Throat Lips, teeth, and gums WNL.Marland Kitchen Moist mucosa without lesions . Neck supple and nontender. No palpable supraclavicular or cervical adenopathy. Normal sized without goiter. Respiratory WNL. No retractions.. Cardiovascular Pedal Pulses WNL. No clubbing, cyanosis or edema. Integumentary (Hair, Skin) the wound is clean and fairly superficial but there is minimal maceration at the edges.. No crepitus or fluctuance. No peri-wound warmth or erythema. No masses.Marland Kitchen Psychiatric Judgement and insight Intact.. No evidence of depression, anxiety, or agitation.. Electronic Signature(s) Signed: 04/27/2015 12:54:03 PM By: Christin Fudge MD, FACS Entered By: Christin Fudge on 04/27/2015 09:36:41 [name] [name]  ([id number]) -------------------------------------------------------------------------------- Physician Orders Details Patient Name: [name] [name] Date of Service: 04/27/2015 8:45 AM Medical Record Number: [id number] Patient Account Number: [id number] Date of Birth/Sex: 03-09-1941 (74 y.o. Male) Treating RN: Junious Dresser Primary Care Physician: Arline Asp,  Mitzi Hansen Other Clinician: Referring Physician: Apolonio Schneiders Treating Physician/Extender: Frann Rider in Treatment: 5 Verbal / Phone Orders: Yes Clinician: Junious Dresser Read Back and Verified: Yes Diagnosis Coding Wound Cleansing Wound #1 Left,Lateral Calcaneous o Clean wound with Normal Saline. Anesthetic Wound #1 Left,Lateral Calcaneous o Topical Lidocaine 4% cream applied to wound bed prior to debridement Primary Wound Dressing Wound #1 Left,Lateral Calcaneous o Prisma Ag Secondary Dressing Wound #1 Left,Lateral Calcaneous o Boardered Foam Dressing Dressing Change Frequency Wound #1 Left,Lateral Calcaneous o Change dressing every day. Follow-up Appointments Wound #1 Left,Lateral Calcaneous o Return Appointment in 75 week. Electronic Signature(s) Signed: 04/27/2015 12:54:03 PM By: Christin Fudge MD, FACS Signed: 04/27/2015 4:40:32 PM By: Junious Dresser RN Entered By: Junious Dresser on 04/27/2015 09:19:06 [name] [name] ([id number]) -------------------------------------------------------------------------------- Problem List Details Patient Name: [name] [name] Date of Service: 04/27/2015 8:45 AM Medical Record Number: [id number] Patient Account Number: [id number] Date of Birth/Sex: 1941/09/14 (74 y.o. 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 Male) Treating RN: Primary Care Physician: Apolonio Schneiders Other Clinician: Referring Physician: Apolonio Schneiders Treating Physician/Extender: Frann Rider in Treatment: 5 Subjective Chief Complaint Information obtained from Patient Patient presents to the wound care center for a consult due non healing wound pleasant 74 year old gentleman who comes with a ulcerated area on the left lateral heel and he's had it for about a month. History of Present Illness (HPI) 74 year old gentleman who comes as a self-referral and has been known to be a diabetic on treatment for the last 30 years and takes insulin for this. Developed a callus on the left lateral heel and does not know exactly what type of footwear cause this. Did see his dermatologist but prescribed some medication for him and also applied some Bactroban cream. Able to soften this area and remove some of the dead skin but he now has a definite ulcer there and knowing he's been a diabetic he came as a self-referral for further treatment. He's been a nonsmoker all his life and hasn't had no problems with any arterial disease and has no workup done for this. 04/06/2015 -- he has been doing his dressing on alternate days but sometimes he runs that the dressing doesn't stay on for long. He also informs me that he is going to be out on vacation this weekend and will  be back only in 2 weeks. 04/27/2015 -- his blood sugars under good control and he says he's been offloading as much as possible and has no pain whatsoever. Objective Constitutional Pulse regular. Respirations normal and unlabored. Afebrile. Vitals Time Taken: 8:55 AM, Height: 71 in, Weight: 180 lbs, BMI: 25.1, Temperature: 98.2 F, Pulse: 58 bpm, Respiratory Rate: 18 breaths/min, Blood Pressure: 106/54 mmHg. Eyes Nonicteric. Reactive to light. [name] [name], [name] [name] ([id number]) Ears, Nose, Mouth, and Throat Lips, teeth, and gums WNL.Marland Kitchen Moist mucosa without lesions . Neck supple and nontender. No palpable  supraclavicular or cervical adenopathy. Normal sized without goiter. Respiratory WNL. No retractions.. Cardiovascular Pedal Pulses WNL. No clubbing, cyanosis or edema. Psychiatric Judgement and insight Intact.. No evidence of depression, anxiety, or agitation.. Integumentary (Hair, Skin) the wound is clean and fairly superficial but there is minimal maceration at the edges.. No crepitus or fluctuance. No peri-wound warmth or erythema. No masses.. Wound #1 status is Open. Original cause of wound was Gradually Appeared. The wound is located on the Left,Lateral Calcaneous. The wound measures 1.5cm length x 1cm width x 0.1cm depth; 1.178cm^2 area and 0.118cm^3 volume. The wound is limited to skin breakdown. There is no tunneling or undermining noted. There is a small amount of serous drainage noted. The wound margin is flat and intact. There is small (1-33%) red, pink granulation within the wound bed. There is a small (1-33%) amount of necrotic tissue within the wound bed including Adherent Slough. The periwound skin appearance exhibited: Callus, Moist. The periwound skin appearance did not exhibit: Crepitus, Excoriation, Fluctuance, Friable, Induration, Localized Edema, Rash, Scarring, Dry/Scaly, Maceration, Atrophie Blanche, Cyanosis, Ecchymosis, Hemosiderin Staining, Mottled, Pallor, Rubor,  Erythema. Periwound temperature was noted as No Abnormality. 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 ([id number]) His progress has been slow and I am going to change from silver alginate to Prisma AG. I believe the collagen will help him a bit and I have asked him to do daily dressings and make sure that the area has not moist and make sure he offloads as much as possible. He will come and see me next week. Plan Wound Cleansing: Wound #1 Left,Lateral Calcaneous: Clean wound with Normal Saline. Anesthetic: Wound #1 Left,Lateral Calcaneous: Topical Lidocaine 4% cream applied to wound bed prior to debridement Primary Wound Dressing: Wound #1 Left,Lateral Calcaneous: Prisma Ag Secondary Dressing: Wound #1 Left,Lateral Calcaneous: Boardered Foam Dressing Dressing Change Frequency: Wound #1 Left,Lateral Calcaneous: Change dressing every day. Follow-up Appointments: Wound #1 Left,Lateral Calcaneous: Return Appointment in 75 week. His progress has been slow and I am going to change from silver alginate to Prisma AG. I believe the collagen will help him a bit and I have asked him to do daily dressings and make sure that the area has not moist and make sure he offloads as much as possible. He will come and see me next week. Electronic Signature(s) Signed: 04/27/2015 10:29:45 AM By: Junious Dresser RN Signed: 04/27/2015 2:50:30 PM By: Christin Fudge MD, FACS Previous Signature: 04/27/2015 12:54:03 PM Version By: Christin Fudge MD, FACS Entered By: Junious Dresser on 04/27/2015 13:29:45 [name] [name] ([name]  ([id number]) -------------------------------------------------------------------------------- SuperBill Details Patient Name: [name] [name] Date of Service: 04/27/2015 Medical Record Number: [id number] Patient Account Number: [id number] Date of Birth/Sex: 05-Jan-1941 (74 y.o. 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

## 2015-04-29 NOTE — Progress Notes (Signed)
HARLEY, FITZWATER ([id number]) Visit Report for 04/27/2015 Arrival Information Details Patient Name: [name] [name], [name] [name] Date of Service: 04/27/2015 8:45 AM Medical Record Number: [id number] Patient Account Number: [id number] Date of Birth/Sex: 1941/09/24 (74 y.o. Male) Treating RN: Baruch Gouty, RN, BSN, Velva Harman Primary Care Physician: Apolonio Schneiders Other Clinician: Referring Physician: Apolonio Schneiders Treating Physician/Extender: Frann Rider in Treatment: 5 Visit Information History Since Last Visit Any new allergies or adverse reactions: No Patient Arrived: Ambulatory Had a fall or experienced change in No Arrival Time: 08:56 activities of daily living that may affect Accompanied By: self risk of falls: Transfer Assistance: None Signs or symptoms of abuse/neglect since last No Patient Identification Verified: Yes visito Secondary Verification Process Yes Hospitalized since last visit: No Completed: Has Dressing in Place as Prescribed: Yes Patient Has Alerts: Yes Pain Present Now: No Patient Alerts: Type II Diabetic AIC 6.2 ABI L 1.20 R 1.12 Electronic Signature(s) Signed: 04/28/2015 5:13:49 PM By: Regan Lemming BSN, RN Entered By: Regan Lemming on 04/27/2015 08:57:07 [name] [name] ([id number]) -------------------------------------------------------------------------------- Clinic Level of Care Assessment Details Patient Name: [name] [name] Date of Service: 04/27/2015 8:45 AM Medical Record Number: [id number] Patient Account Number: [id number] Date of Birth/Sex: 22-Apr-1941 (74 y.o. Male) Treating RN: Junious Dresser Primary Care Physician: Apolonio Schneiders Other Clinician: Referring Physician: Apolonio Schneiders Treating Physician/Extender: Frann Rider in Treatment: 5 Clinic Level of Care Assessment Items TOOL 4 Quantity Score []  - Use when only an EandM is performed on FOLLOW-UP visit 0 ASSESSMENTS - Nursing Assessment / Reassessment []  - Reassessment of Co-morbidities (includes  updates in patient status) 0 []  - Reassessment of Adherence to Treatment Plan 0 ASSESSMENTS - Wound and Skin Assessment / Reassessment X - Simple Wound Assessment / Reassessment - one wound 1 5 []  - Complex Wound Assessment / Reassessment - multiple wounds 0 []  - Dermatologic / Skin Assessment (not related to wound area) 0 ASSESSMENTS - Focused Assessment []  - Circumferential Edema Measurements - multi extremities 0 []  - Nutritional Assessment / Counseling / Intervention 0 X - Lower Extremity Assessment (monofilament, tuning fork, pulses) 1 5 []  - Peripheral Arterial Disease Assessment (using hand held doppler) 0 ASSESSMENTS - Ostomy and/or Continence Assessment and Care []  - Incontinence Assessment and Management 0 []  - Ostomy Care Assessment and Management (repouching, etc.) 0 PROCESS - Coordination of Care X - Simple Patient / Family Education for ongoing care 1 15 []  - Complex (extensive) Patient / Family Education for ongoing care 0 []  - Staff obtains Programmer, systems, Records, Test Results / Process Orders 0 []  - Staff telephones HHA, Nursing Homes / Clarify orders / etc 0 []  - Routine Transfer to another Facility (non-emergent condition) 0 [name] [name], CRALL. ([id number]) []  - Routine Hospital Admission (non-emergent condition) 0 []  - New Admissions / Biomedical engineer / Ordering NPWT, Apligraf, etc. 0 []  - Emergency Hospital Admission (emergent condition) 0 X - Simple Discharge Coordination 1 10 []  - Complex (extensive) Discharge Coordination 0 PROCESS - Special Needs []  - Pediatric / Minor Patient Management 0 []  - Isolation Patient Management 0 []  - Hearing / Language / Visual special needs 0 []  - Assessment of Community assistance (transportation, D/C planning, etc.) 0 []  - Additional assistance / Altered mentation 0 []  - Support Surface(s) Assessment (bed, cushion, seat, etc.) 0 INTERVENTIONS - Wound Cleansing / Measurement X - Simple Wound Cleansing - one wound 1 5 []  -  Complex Wound Cleansing - multiple wounds 0 X - Wound Imaging (photographs - any number of wounds) 1  5 []  - Wound Tracing (instead of photographs) 0 X - Simple Wound Measurement - one wound 1 5 []  - Complex Wound Measurement - multiple wounds 0 INTERVENTIONS - Wound Dressings X - Small Wound Dressing one or multiple wounds 1 10 []  - Medium Wound Dressing one or multiple wounds 0 []  - Large Wound Dressing one or multiple wounds 0 []  - Application of Medications - topical 0 []  - Application of Medications - injection 0 INTERVENTIONS - Miscellaneous []  - External ear exam 0 [name] [name], [name] [name]. ([id number]) []  - Specimen Collection (cultures, biopsies, blood, body fluids, etc.) 0 []  - Specimen(s) / Culture(s) sent or taken to Lab for analysis 0 []  - Patient Transfer (multiple staff / Harrel Lemon Lift / Similar devices) 0 []  - Simple Staple / Suture removal (25 or less) 0 []  - Complex Staple / Suture removal (26 or more) 0 []  - Hypo / Hyperglycemic Management (close monitor of Blood Glucose) 0 []  - Ankle / Brachial Index (ABI) - do not check if billed separately 0 X - Vital Signs 1 5 Has the patient been seen at the hospital within the last three years: Yes Total Score: 65 Level Of Care: New/Established - Level 2 Electronic Signature(s) Signed: 04/27/2015 4:40:32 PM By: Junious Dresser RN Entered By: Junious Dresser on 04/27/2015 10:07:28 [name] [name] ([id number]) -------------------------------------------------------------------------------- Encounter Discharge Information Details Patient Name: [name] [name] Date of Service: 04/27/2015 8:45 AM Medical Record Number: [id number] Patient Account Number: [id number] Date of Birth/Sex: 12-Jan-1941 (74 y.o. Male) Treating RN: Baruch Gouty, RN, BSN, [place] Primary Care Physician: Apolonio Schneiders Other Clinician: Referring Physician: Apolonio Schneiders Treating Physician/Extender: Frann Rider in Treatment: 5 Encounter Discharge Information Items Discharge Pain  Level: 0 Discharge Condition: Stable Ambulatory Status: Ambulatory Discharge Destination: Home Transportation: Private Auto Accompanied By: self Schedule Follow-up Appointment: No Medication Reconciliation completed No and provided to Patient/Care Jaylah Goodlow: Patient Clinical Summary of Care: Declined Electronic Signature(s) Signed: 04/28/2015 5:13:49 PM By: Regan Lemming BSN, RN Previous Signature: 04/27/2015 9:25:43 AM Version By: Ruthine Dose Entered By: Regan Lemming on 04/27/2015 09:32:05 [name] [name] ([id number]) -------------------------------------------------------------------------------- Lower Extremity Assessment Details Patient Name: [name] [name] Date of Service: 04/27/2015 8:45 AM Medical Record Number: [id number] Patient Account Number: [id number] Date of Birth/Sex: 25-Mar-1941 (74 y.o. Male) Treating RN: Baruch Gouty, RN, BSN, Velva Harman Primary Care Physician: Apolonio Schneiders Other Clinician: Referring Physician: Apolonio Schneiders Treating Physician/Extender: Frann Rider in Treatment: 5 Vascular Assessment Pulses: Posterior Tibial Dorsalis Pedis Palpable: [Left:Yes] Extremity colors, hair growth, and conditions: Extremity Color: [Left:Normal] Hair Growth on Extremity: [Left:Yes] Temperature of Extremity: [Left:Warm] Capillary Refill: [Left:< 3 seconds] Dependent Rubor: [Left:No] Blanched when Elevated: [Left:No] Lipodermatosclerosis: [Left:No] Toe Nail Assessment Left: Right: Thick: No Discolored: No Deformed: No Improper Length and Hygiene: No Electronic Signature(s) Signed: 04/28/2015 5:13:49 PM By: Regan Lemming BSN, RN Entered By: Regan Lemming on 04/27/2015 08:59:38 [name] [name] ([id number]) -------------------------------------------------------------------------------- Multi Wound Chart Details Patient Name: [name] [name] Date of Service: 04/27/2015 8:45 AM Medical Record Number: [id number] Patient Account Number: [id number] Date of Birth/Sex: [birth year] (74  y.o. Male) Treating RN: Junious Dresser Primary Care Physician: Apolonio Schneiders Other Clinician: Referring Physician: Apolonio Schneiders Treating Physician/Extender: Frann Rider in Treatment: 5 Vital Signs Height(in): 71 Pulse(bpm): 58 Weight(lbs): 180 Blood Pressure 106/54 (mmHg): Body Mass Index(BMI): 25 Temperature(F): 98.2 Respiratory Rate 18 (breaths/min): Photos: [1:No Photos] [N/A:N/A] Wound Location: [1:Left Calcaneous - Lateral] [N/A:N/A] Wounding Event: [1:Gradually Appeared] [N/A:N/A] Primary Etiology: [1:Diabetic Wound/Ulcer of the Lower Extremity] [N/A:N/A] Comorbid History: [1:Type II  Diabetes] [N/A:N/A] Date Acquired: [1:02/12/2015] [N/A:N/A] Weeks of Treatment: [1:5] [N/A:N/A] Wound Status: [1:Open] [N/A:N/A] Measurements L x W x D 1.5x1x0.1 [N/A:N/A] (cm) Area (cm) : [1:1.178] [N/A:N/A] Volume (cm) : [1:0.118] [N/A:N/A] % Reduction in Area: [1:-33.90%] [N/A:N/A] % Reduction in Volume: -34.10% [N/A:N/A] Classification: [1:Grade 1] [N/A:N/A] Exudate Amount: [1:Small] [N/A:N/A] Exudate Type: [1:Serous] [N/A:N/A] Exudate Color: [1:amber] [N/A:N/A] Wound Margin: [1:Flat and Intact] [N/A:N/A] Granulation Amount: [1:Small (1-33%)] [N/A:N/A] Granulation Quality: [1:Red, Pink] [N/A:N/A] Necrotic Amount: [1:Small (1-33%)] [N/A:N/A] Exposed Structures: [1:Fascia: No Fat: No Tendon: No Muscle: No Joint: No Bone: No] [N/A:N/A] Limited to Skin Breakdown Epithelialization: Large (67-100%) N/A N/A Periwound Skin Texture: Callus: Yes N/A N/A Edema: No Excoriation: No Induration: No Crepitus: No Fluctuance: No Friable: No Rash: No Scarring: No Periwound Skin Moist: Yes N/A N/A Moisture: Maceration: No Dry/Scaly: No Periwound Skin Color: Atrophie Blanche: No N/A N/A Cyanosis: No Ecchymosis: No Erythema: No Hemosiderin Staining: No Mottled: No Pallor: No Rubor: No Temperature: No Abnormality N/A N/A Tenderness on No N/A N/A Palpation: Wound  Preparation: Ulcer Cleansing: N/A N/A Rinsed/Irrigated with Saline Topical Anesthetic Applied: Other: lidocaine 4% Treatment Notes Electronic Signature(s) Signed: 04/27/2015 4:40:32 PM By: Junious Dresser RN Entered By: Junious Dresser on 04/27/2015 09:15:17 [name] [name] ([id number]) -------------------------------------------------------------------------------- [place] Details Patient Name: [name] [name] Date of Service: 04/27/2015 8:45 AM Medical Record Number: [id number] Patient Account Number: [id number] Date of Birth/Sex: 02-14-1941 (74 y.o. Male) Treating RN: Junious Dresser Primary Care Physician: Apolonio Schneiders Other Clinician: Referring Physician: Apolonio Schneiders Treating Physician/Extender: Frann Rider in Treatment: 5 Active Inactive Orientation to the Wound Care Program Nursing Diagnoses: Knowledge deficit related to the wound healing center program Goals: Patient/caregiver will verbalize understanding of the [place] Program Date Initiated: 03/23/2015 Goal Status: Active Interventions: Provide education on orientation to the wound center Notes: Wound/Skin Impairment Nursing Diagnoses: Knowledge deficit related to ulceration/compromised skin integrity Goals: Patient/caregiver will verbalize understanding of skin care regimen Date Initiated: 03/23/2015 Goal Status: Active Ulcer/skin breakdown will heal within 14 weeks Date Initiated: 03/23/2015 Goal Status: Active Interventions: Assess patient/caregiver ability to obtain necessary supplies Assess patient/caregiver ability to perform ulcer/skin care regimen upon admission and as needed Assess ulceration(s) every visit Provide education on ulcer and skin care Treatment Activities: Skin care regimen initiated : 04/27/2015 Topical wound management initiated : 04/27/2015 [name] [name], [name] [name] ([id number]) Notes: Electronic Signature(s) Signed: 04/27/2015 4:40:32 PM By: Junious Dresser  RN Entered By: Junious Dresser on 04/27/2015 09:15:07 [name] [name] ([id number]) -------------------------------------------------------------------------------- Pain Assessment Details Patient Name: [name] [name] Date of Service: 04/27/2015 8:45 AM Medical Record Number: [id number] Patient Account Number: [id number] Date of Birth/Sex: 1941-05-15 (74 y.o. Male) Treating RN: Baruch Gouty, RN, BSN, Velva Harman Primary Care Physician: Apolonio Schneiders Other Clinician: Referring Physician: Apolonio Schneiders Treating Physician/Extender: Frann Rider in Treatment: 5 Active Problems Location of Pain Severity and Description of Pain Patient Has Paino No Site Locations Pain Management and Medication Current Pain Management: Electronic Signature(s) Signed: 04/28/2015 5:13:49 PM By: Regan Lemming BSN, RN Entered By: Regan Lemming on 04/27/2015 08:57:41 [name] [name] ([id number]) -------------------------------------------------------------------------------- Patient/Caregiver Education Details Patient Name: [name] [name] Date of Service: 04/27/2015 8:45 AM Medical Record Number: [id number] Patient Account Number: [id number] Date of Birth/Gender: [birth year] (74 y.o. Male) Treating RN: Baruch Gouty, RN, BSN, [place] Primary Care Physician: Apolonio Schneiders Other Clinician: Referring Physician: Apolonio Schneiders Treating Physician/Extender: Frann Rider in Treatment: 5 Education Assessment Education Provided To: Patient Education Topics Provided Basic Hygiene: Methods: Explain/Verbal Responses: State content correctly Wound/Skin Impairment:  Methods: Explain/Verbal Responses: State content correctly Electronic Signature(s) Signed: 04/28/2015 5:13:49 PM By: Regan Lemming BSN, RN Entered By: Regan Lemming on 04/27/2015 09:32:27 [name] [name] ([id number]) -------------------------------------------------------------------------------- Wound Assessment Details Patient Name: [name] [name] Date of Service: 04/27/2015  8:45 AM Medical Record Number: [id number] Patient Account Number: [id number] Date of Birth/Sex: [birth year] (74 y.o. Male) Treating RN: Baruch Gouty, RN, BSN, [place] Primary Care Physician: Apolonio Schneiders Other Clinician: Referring Physician: Apolonio Schneiders Treating Physician/Extender: Frann Rider in Treatment: 5 Wound Status Wound Number: 1 Primary Diabetic Wound/Ulcer of the Lower Etiology: Extremity Wound Location: Left Calcaneous - Lateral Wound Status: Open Wounding Event: Gradually Appeared Comorbid Type II Diabetes Date Acquired: 02/12/2015 History: Weeks Of Treatment: 5 Clustered Wound: No Photos Photo Uploaded By: Regan Lemming on 04/27/2015 14:33:23 Wound Measurements Length: (cm) 1.5 Width: (cm) 1 Depth: (cm) 0.1 Area: (cm) 1.178 Volume: (cm) 0.118 % Reduction in Area: -33.9% % Reduction in Volume: -34.1% Epithelialization: Large (67-100%) Tunneling: No Undermining: No Wound Description Classification: Grade 1 Wound Margin: Flat and Intact Exudate Amount: Small Exudate Type: Serous Exudate Color: amber Foul Odor After Cleansing: No Wound Bed Granulation Amount: Small (1-33%) Exposed Structure Granulation Quality: Red, Pink Fascia Exposed: No Necrotic Amount: Small (1-33%) Fat Layer Exposed: No Necrotic Quality: Adherent Slough Tendon Exposed: No [name] [name], [place]. ([id number]) Muscle Exposed: No Joint Exposed: No Bone Exposed: No Limited to Skin Breakdown Periwound Skin Texture Texture Color No Abnormalities Noted: No No Abnormalities Noted: No Callus: Yes Atrophie Blanche: No Crepitus: No Cyanosis: No Excoriation: No Ecchymosis: No Fluctuance: No Erythema: No Friable: No Hemosiderin Staining: No Induration: No Mottled: No Localized Edema: No Pallor: No Rash: No Rubor: No Scarring: No Temperature / Pain Moisture Temperature: No Abnormality No Abnormalities Noted: No Dry / Scaly: No Maceration: No Moist: Yes Wound Preparation Ulcer  Cleansing: Rinsed/Irrigated with Saline Topical Anesthetic Applied: Other: lidocaine 4%, Treatment Notes Wound #1 (Left, Lateral Calcaneous) 1. Cleansed with: Clean wound with Normal Saline 4. Dressing Applied: Prisma Ag 5. Secondary Dressing Applied Bordered Foam Dressing Electronic Signature(s) Signed: 04/28/2015 5:13:49 PM By: Regan Lemming BSN, RN Entered By: Regan Lemming on 04/27/2015 09:01:07 [name] [name] ([id number]) -------------------------------------------------------------------------------- Vitals Details Patient Name: [name] [name] Date of Service: 04/27/2015 8:45 AM Medical Record Number: [id number] Patient Account Number: [id number] Date of Birth/Sex: 1940/12/27 (74 y.o. Male) Treating RN: Afful, RN, BSN, [place] Primary Care Physician: Apolonio Schneiders Other Clinician: Referring Physician: Apolonio Schneiders Treating Physician/Extender: Frann Rider in Treatment: 5 Vital Signs Time Taken: 08:55 Temperature (F): 98.2 Height (in): 71 Pulse (bpm): 58 Weight (lbs): 180 Respiratory Rate (breaths/min): 18 Body Mass Index (BMI): 25.1 Blood Pressure (mmHg): 106/54 Reference Range: 80 - 120 mg / dl Electronic Signature(s) Signed: 04/28/2015 5:13:49 PM By: Regan Lemming BSN, RN Entered By: Regan Lemming on 04/27/2015 08:59:11

## 2015-05-04 ENCOUNTER — Encounter: Payer: Medicare Other | Admitting: Surgery

## 2015-05-04 DIAGNOSIS — L97421 Non-pressure chronic ulcer of left heel and midfoot limited to breakdown of skin: Secondary | ICD-10-CM | POA: Diagnosis not present

## 2015-05-04 NOTE — Progress Notes (Signed)
LARENZ, FRASIER ([id number]) Visit Report for 05/04/2015 Arrival Information Details Patient Name: WAYDEN, SCHWERTNER Date of Service: 05/04/2015 8:15 AM Medical Record Number: [id number] Patient Account Number: [id number] Date of Birth/Sex: [birth year] (74 y.o. Male) Treating RN: Baruch Gouty, RN, BSN, Velva Harman Primary Care Physician: Apolonio Schneiders Other Clinician: Referring Physician: Apolonio Schneiders Treating Physician/Extender: Frann Rider in Treatment: 6 Visit Information History Since Last Visit Any new allergies or adverse reactions: No Patient Arrived: Ambulatory Had a fall or experienced change in No Arrival Time: 08:09 activities of daily living that may affect Accompanied By: self risk of falls: Transfer Assistance: None Signs or symptoms of abuse/neglect since last No Patient Identification Verified: Yes visito Secondary Verification Process Yes Hospitalized since last visit: No Completed: Has Dressing in Place as Prescribed: Yes Patient Has Alerts: Yes Pain Present Now: No Patient Alerts: Type II Diabetic AIC 6.2 ABI L 1.20 R 1.12 Electronic Signature(s) Signed: 05/04/2015 4:37:28 PM By: Regan Lemming BSN, RN Entered By: Regan Lemming on 05/04/2015 08:09:50 [name] ([id number]) -------------------------------------------------------------------------------- Clinic Level of Care Assessment Details Patient Name: [name] Date of Service: 05/04/2015 8:15 AM Medical Record Number: [id number] Patient Account Number: [id number] Date of Birth/Sex: November 27, 1941 (74 y.o. Male) Treating RN: Junious Dresser Primary Care Physician: Apolonio Schneiders Other Clinician: Referring Physician: Apolonio Schneiders Treating Physician/Extender: Frann Rider in Treatment: 6 Clinic Level of Care Assessment Items TOOL 4 Quantity Score []  - Use when only an EandM is performed on FOLLOW-UP visit 0 ASSESSMENTS - Nursing Assessment / Reassessment []  - Reassessment of Co-morbidities (includes  updates in patient status) 0 []  - Reassessment of Adherence to Treatment Plan 0 ASSESSMENTS - Wound and Skin Assessment / Reassessment X - Simple Wound Assessment / Reassessment - one wound 1 5 []  - Complex Wound Assessment / Reassessment - multiple wounds 0 []  - Dermatologic / Skin Assessment (not related to wound area) 0 ASSESSMENTS - Focused Assessment []  - Circumferential Edema Measurements - multi extremities 0 []  - Nutritional Assessment / Counseling / Intervention 0 X - Lower Extremity Assessment (monofilament, tuning fork, pulses) 1 5 []  - Peripheral Arterial Disease Assessment (using hand held doppler) 0 ASSESSMENTS - Ostomy and/or Continence Assessment and Care []  - Incontinence Assessment and Management 0 []  - Ostomy Care Assessment and Management (repouching, etc.) 0 PROCESS - Coordination of Care X - Simple Patient / Family Education for ongoing care 1 15 []  - Complex (extensive) Patient / Family Education for ongoing care 0 []  - Staff obtains Programmer, systems, Records, Test Results / Process Orders 0 []  - Staff telephones HHA, Nursing Homes / Clarify orders / etc 0 []  - Routine Transfer to another Facility (non-emergent condition) 0 TAYGEN, ACKLIN. ([id number]) []  - Routine Hospital Admission (non-emergent condition) 0 []  - New Admissions / Biomedical engineer / Ordering NPWT, Apligraf, etc. 0 []  - Emergency Hospital Admission (emergent condition) 0 X - Simple Discharge Coordination 1 10 []  - Complex (extensive) Discharge Coordination 0 PROCESS - Special Needs []  - Pediatric / Minor Patient Management 0 []  - Isolation Patient Management 0 []  - Hearing / Language / Visual special needs 0 []  - Assessment of Community assistance (transportation, D/C planning, etc.) 0 []  - Additional assistance / Altered mentation 0 []  - Support Surface(s) Assessment (bed, cushion, seat, etc.) 0 INTERVENTIONS - Wound Cleansing / Measurement X - Simple Wound Cleansing - one wound 1 5 []  -  Complex Wound Cleansing - multiple wounds 0 X - Wound Imaging (photographs - any number of wounds) 1  5 []  - Wound Tracing (instead of photographs) 0 X - Simple Wound Measurement - one wound 1 5 []  - Complex Wound Measurement - multiple wounds 0 INTERVENTIONS - Wound Dressings X - Small Wound Dressing one or multiple wounds 1 10 []  - Medium Wound Dressing one or multiple wounds 0 []  - Large Wound Dressing one or multiple wounds 0 []  - Application of Medications - topical 0 []  - Application of Medications - injection 0 INTERVENTIONS - Miscellaneous []  - External ear exam 0 KERMAN, PFOST. ([id number]) []  - Specimen Collection (cultures, biopsies, blood, body fluids, etc.) 0 []  - Specimen(s) / Culture(s) sent or taken to Lab for analysis 0 []  - Patient Transfer (multiple staff / Harrel Lemon Lift / Similar devices) 0 []  - Simple Staple / Suture removal (25 or less) 0 []  - Complex Staple / Suture removal (26 or more) 0 []  - Hypo / Hyperglycemic Management (close monitor of Blood Glucose) 0 []  - Ankle / Brachial Index (ABI) - do not check if billed separately 0 X - Vital Signs 1 5 Has the patient been seen at the hospital within the last three years: Yes Total Score: 65 Level Of Care: New/Established - Level 2 Electronic Signature(s) Signed: 05/04/2015 3:19:03 PM By: Junious Dresser RN Entered By: Junious Dresser on 05/04/2015 84:69:62 [name] ([id number]) -------------------------------------------------------------------------------- Encounter Discharge Information Details Patient Name: [name] Date of Service: 05/04/2015 8:15 AM Medical Record Number: [id number] Patient Account Number: [id number] Date of Birth/Sex: 11/30/1941 (74 y.o. Male) Treating RN: Primary Care Physician: Apolonio Schneiders Other Clinician: Referring Physician: Apolonio Schneiders Treating Physician/Extender: Frann Rider in Treatment: 6 Encounter Discharge Information Items Schedule Follow-up Appointment:  No Medication Reconciliation completed No and provided to Patient/Care Sharryn Belding: Provided on Clinical Summary of Care: 05/04/2015 Form Type Recipient Paper Patient RO Electronic Signature(s) Signed: 05/04/2015 8:29:07 AM By: Ruthine Dose Entered By: Ruthine Dose on 05/04/2015 08:29:07 [name] ([id number]) -------------------------------------------------------------------------------- Lower Extremity Assessment Details Patient Name: [name] Date of Service: 05/04/2015 8:15 AM Medical Record Number: [id number] Patient Account Number: [id number] Date of Birth/Sex: 12/03/1941 (73 y.o. Male) Treating RN: Baruch Gouty, RN, BSN, Velva Harman Primary Care Physician: Apolonio Schneiders Other Clinician: Referring Physician: Apolonio Schneiders Treating Physician/Extender: Frann Rider in Treatment: 6 Vascular Assessment Claudication: Claudication Assessment [Left:None] Pulses: Posterior Tibial Dorsalis Pedis Palpable: [Left:Yes] Extremity colors, hair growth, and conditions: Extremity Color: [Left:Normal] Hair Growth on Extremity: [Left:Yes] Temperature of Extremity: [Left:Warm] Capillary Refill: [Left:< 3 seconds] Dependent Rubor: [Left:No] Blanched when Elevated: [Left:No] Lipodermatosclerosis: [Left:No] Toe Nail Assessment Left: Right: Thick: No Discolored: No Deformed: No Improper Length and Hygiene: No Electronic Signature(s) Signed: 05/04/2015 4:37:28 PM By: Regan Lemming BSN, RN Entered By: Regan Lemming on 05/04/2015 08:16:14 [name] ([id number]) -------------------------------------------------------------------------------- Multi Wound Chart Details Patient Name: [name] Date of Service: 05/04/2015 8:15 AM Medical Record Number: [id number] Patient Account Number: [id number] Date of Birth/Sex: 1941-01-16 (74 y.o. Male) Treating RN: Junious Dresser Primary Care Physician: Apolonio Schneiders Other Clinician: Referring Physician: Apolonio Schneiders Treating  Physician/Extender: Frann Rider in Treatment: 6 Vital Signs Height(in): 71 Pulse(bpm): 64 Weight(lbs): 180 Blood Pressure 116/60 (mmHg): Body Mass Index(BMI): 25 Temperature(F): 97.8 Respiratory Rate 18 (breaths/min): Photos: [1:No Photos] [N/A:N/A] Wound Location: [1:Left Calcaneous - Lateral] [N/A:N/A] Wounding Event: [1:Gradually Appeared] [N/A:N/A] Primary Etiology: [1:Diabetic Wound/Ulcer of the Lower Extremity] [N/A:N/A] Comorbid History: [1:Type II Diabetes] [N/A:N/A] Date Acquired: [1:02/12/2015] [N/A:N/A] Weeks of Treatment: [1:6] [N/A:N/A] Wound Status: [1:Open] [N/A:N/A] Measurements L x W x D 0.5x0.7x0.1 [N/A:N/A] (  cm) Area (cm) : [1:0.275] [N/A:N/A] Volume (cm) : [1:0.027] [N/A:N/A] % Reduction in Area: [1:68.80%] [N/A:N/A] % Reduction in Volume: 69.30% [N/A:N/A] Classification: [1:Grade 1] [N/A:N/A] Exudate Amount: [1:Small] [N/A:N/A] Exudate Type: [1:Serous] [N/A:N/A] Exudate Color: [1:amber] [N/A:N/A] Wound Margin: [1:Flat and Intact] [N/A:N/A] Granulation Amount: [1:Large (67-100%)] [N/A:N/A] Granulation Quality: [1:Red, Pink] [N/A:N/A] Necrotic Amount: [1:Small (1-33%)] [N/A:N/A] Exposed Structures: [1:Fascia: No Fat: No Tendon: No Muscle: No Joint: No Bone: No] [N/A:N/A] Limited to Skin Breakdown Epithelialization: Large (67-100%) N/A N/A Periwound Skin Texture: Callus: Yes N/A N/A Edema: No Excoriation: No Induration: No Crepitus: No Fluctuance: No Friable: No Rash: No Scarring: No Periwound Skin Moist: Yes N/A N/A Moisture: Maceration: No Dry/Scaly: No Periwound Skin Color: Atrophie Blanche: No N/A N/A Cyanosis: No Ecchymosis: No Erythema: No Hemosiderin Staining: No Mottled: No Pallor: No Rubor: No Temperature: No Abnormality N/A N/A Tenderness on No N/A N/A Palpation: Wound Preparation: Ulcer Cleansing: N/A N/A Rinsed/Irrigated with Saline Topical Anesthetic Applied: Other: lidocaine 4% Treatment  Notes Electronic Signature(s) Signed: 05/04/2015 3:19:03 PM By: Junious Dresser RN Entered By: Junious Dresser on 05/04/2015 08:25:13 [name] ([id number]) -------------------------------------------------------------------------------- [place] Details Patient Name: [name] Date of Service: 05/04/2015 8:15 AM Medical Record Number: [id number] Patient Account Number: [id number] Date of Birth/Sex: [birth year] (74 y.o. Male) Treating RN: Junious Dresser Primary Care Physician: Apolonio Schneiders Other Clinician: Referring Physician: Apolonio Schneiders Treating Physician/Extender: Frann Rider in Treatment: 6 Active Inactive Orientation to the Wound Care Program Nursing Diagnoses: Knowledge deficit related to the wound healing center program Goals: Patient/caregiver will verbalize understanding of the [facility] Program Date Initiated: 03/23/2015 Goal Status: Active Interventions: Provide education on orientation to the wound center Notes: Wound/Skin Impairment Nursing Diagnoses: Knowledge deficit related to ulceration/compromised skin integrity Goals: Patient/caregiver will verbalize understanding of skin care regimen Date Initiated: 03/23/2015 Goal Status: Active Ulcer/skin breakdown will heal within 14 weeks Date Initiated: 03/23/2015 Goal Status: Active Interventions: Assess patient/caregiver ability to obtain necessary supplies Assess patient/caregiver ability to perform ulcer/skin care regimen upon admission and as needed Assess ulceration(s) every visit Provide education on ulcer and skin care Treatment Activities: Skin care regimen initiated : 05/04/2015 Topical wound management initiated : 05/04/2015 EMMITT, MATTHEWS ([id number]) Notes: Electronic Signature(s) Signed: 05/04/2015 3:19:03 PM By: Junious Dresser RN Entered By: Junious Dresser on 05/04/2015 08:25:05 [name]  ([id number]) -------------------------------------------------------------------------------- Pain Assessment Details Patient Name: [name] Date of Service: 05/04/2015 8:15 AM Medical Record Number: [id number] Patient Account Number: [id number] Date of Birth/Sex: Aug 03, 1941 (74 y.o. Male) Treating RN: Baruch Gouty, RN, BSN, Velva Harman Primary Care Physician: Apolonio Schneiders Other Clinician: Referring Physician: Apolonio Schneiders Treating Physician/Extender: Frann Rider in Treatment: 6 Active Problems Location of Pain Severity and Description of Pain Patient Has Paino No Site Locations Pain Management and Medication Current Pain Management: Electronic Signature(s) Signed: 05/04/2015 4:37:28 PM By: Regan Lemming BSN, RN Entered By: Regan Lemming on 05/04/2015 08:09:56 [name] ([id number]) -------------------------------------------------------------------------------- Patient/Caregiver Education Details Patient Name: [name] Date of Service: 05/04/2015 8:15 AM Medical Record Number: [id number] Patient Account Number: [id number] Date of Birth/Gender: Jul 10, 1941 (74 y.o. Male) Treating RN: Junious Dresser Primary Care Physician: Apolonio Schneiders Other Clinician: Referring Physician: Apolonio Schneiders Treating Physician/Extender: Frann Rider in Treatment: 6 Education Assessment Education Provided To: Patient Education Topics Provided Pressure: Methods: Explain/Verbal Responses: State content correctly Safety: Methods: Explain/Verbal Responses: State content correctly Wound/Skin Impairment: Methods: Explain/Verbal Responses: State content correctly Electronic Signature(s) Signed: 05/04/2015 3:19:03 PM By: Junious Dresser RN Entered By: Connye Burkitt  Kelsey on 05/04/2015 08:36:12 MATTHIEU, LOFTUS ([id number]) -------------------------------------------------------------------------------- Wound Assessment Details Patient Name: YASHUA, BRACCO Date of Service: 05/04/2015 8:15 AM Medical  Record Number: [id number] Patient Account Number: [id number] Date of Birth/Sex: November 26, 1941 (74 y.o. Male) Treating RN: Baruch Gouty, RN, BSN, Velva Harman Primary Care Physician: Apolonio Schneiders Other Clinician: Referring Physician: Apolonio Schneiders Treating Physician/Extender: Frann Rider in Treatment: 6 Wound Status Wound Number: 1 Primary Diabetic Wound/Ulcer of the Lower Etiology: Extremity Wound Location: Left Calcaneous - Lateral Wound Status: Open Wounding Event: Gradually Appeared Comorbid Type II Diabetes Date Acquired: 02/12/2015 History: Weeks Of Treatment: 6 Clustered Wound: No Photos Photo Uploaded By: Regan Lemming on 05/04/2015 14:50:56 Wound Measurements Length: (cm) 0.5 Width: (cm) 0.7 Depth: (cm) 0.1 Area: (cm) 0.275 Volume: (cm) 0.027 % Reduction in Area: 68.8% % Reduction in Volume: 69.3% Epithelialization: Large (67-100%) Tunneling: No Undermining: No Wound Description Classification: Grade 1 Wound Margin: Flat and Intact Exudate Amount: Small Exudate Type: Serous Exudate Color: amber Foul Odor After Cleansing: No Wound Bed Granulation Amount: Large (67-100%) Exposed Structure Granulation Quality: Red, Pink Fascia Exposed: No Necrotic Amount: Small (1-33%) Fat Layer Exposed: No Necrotic Quality: Adherent Slough Tendon Exposed: No KESHON, MARKOVITZ. ([id number]) Muscle Exposed: No Joint Exposed: No Bone Exposed: No Limited to Skin Breakdown Periwound Skin Texture Texture Color No Abnormalities Noted: No No Abnormalities Noted: No Callus: Yes Atrophie Blanche: No Crepitus: No Cyanosis: No Excoriation: No Ecchymosis: No Fluctuance: No Erythema: No Friable: No Hemosiderin Staining: No Induration: No Mottled: No Localized Edema: No Pallor: No Rash: No Rubor: No Scarring: No Temperature / Pain Moisture Temperature: No Abnormality No Abnormalities Noted: No Dry / Scaly: No Maceration: No Moist: Yes Wound Preparation Ulcer Cleansing:  Rinsed/Irrigated with Saline Topical Anesthetic Applied: Other: lidocaine 4%, Electronic Signature(s) Signed: 05/04/2015 4:37:28 PM By: Regan Lemming BSN, RN Entered By: Regan Lemming on 05/04/2015 08:15:44 [name] ([id number]) -------------------------------------------------------------------------------- Vitals Details Patient Name: [name] Date of Service: 05/04/2015 8:15 AM Medical Record Number: [id number] Patient Account Number: [id number] Date of Birth/Sex: [birth year] (74 y.o. Male) Treating RN: Afful, RN, BSN, [place] Primary Care Physician: Apolonio Schneiders Other Clinician: Referring Physician: Apolonio Schneiders Treating Physician/Extender: Frann Rider in Treatment: 6 Vital Signs Time Taken: 08:10 Temperature (F): 97.8 Height (in): 71 Pulse (bpm): 64 Weight (lbs): 180 Respiratory Rate (breaths/min): 18 Body Mass Index (BMI): 25.1 Blood Pressure (mmHg): 116/60 Reference Range: 80 - 120 mg / dl Electronic Signature(s) Signed: 05/04/2015 4:37:28 PM By: Regan Lemming BSN, RN Entered By: Regan Lemming on 05/04/2015 08:14:07

## 2015-05-04 NOTE — Progress Notes (Signed)
CHETT, TANIGUCHI ([id number]) Visit Report for 05/04/2015 Chief Complaint Document Details Patient Name: [name] [name], [name] [name] Date of Service: 05/04/2015 8:15 AM Medical Record Number: [id number] Patient Account Number: [id number] Date of Birth/Sex: [birth year] (74 y.o. Male) Treating RN: Primary Care Physician: Apolonio Schneiders Other Clinician: Referring Physician: Apolonio Schneiders Treating Physician/Extender: Frann Rider in Treatment: 6 Information Obtained from: Patient Chief Complaint Patient presents to the wound care center for a consult due non healing wound pleasant 74 year old gentleman who comes with a ulcerated area on the left lateral heel and he's had it for about a month. Electronic Signature(s) Signed: 05/04/2015 1:29:55 PM By: Christin Fudge MD, FACS Entered By: Christin Fudge on 05/04/2015 93:81:82 [name] [name] ([id number]) -------------------------------------------------------------------------------- HPI Details Patient Name: [name] [name] Date of Service: 05/04/2015 8:15 AM Medical Record Number: [id number] Patient Account Number: [id number] Date of Birth/Sex: 1941-01-22 (74 y.o. Male) Treating RN: Primary Care Physician: Apolonio Schneiders Other Clinician: Referring Physician: Apolonio Schneiders Treating Physician/Extender: Frann Rider in Treatment: 6 History of Present Illness HPI Description: 74 year old gentleman who comes as a self-referral and has been known to be a diabetic on treatment for the last 30 years and takes insulin for this. Developed a callus on the left lateral heel and does not know exactly what type of footwear cause this. Did see his dermatologist but prescribed some medication for him and also applied some Bactroban cream. Able to soften this area and remove some of the dead skin but he now has a definite ulcer there and knowing he's been a diabetic he came as a self-referral for further treatment. He's been a nonsmoker all his life and hasn't had  no problems with any arterial disease and has no workup done for this. 04/06/2015 -- he has been doing his dressing on alternate days but sometimes he runs that the dressing doesn't stay on for long. He also informs me that he is going to be out on vacation this weekend and will be back only in 2 weeks. 04/27/2015 -- his blood sugars under good control and he says he's been offloading as much as possible and has no pain whatsoever. 05/04/2015 -- he has been doing very well his blood sugars are well under control and his offloading and changing of dressing has been very compliant. Electronic Signature(s) Signed: 05/04/2015 1:29:55 PM By: Christin Fudge MD, FACS Entered By: Christin Fudge on 05/04/2015 08:27:09 [name] [name] ([id number]) -------------------------------------------------------------------------------- Physical Exam Details Patient Name: [name] [name] Date of Service: 05/04/2015 8:15 AM Medical Record Number: [id number] Patient Account Number: [id number] Date of Birth/Sex: 05-12-1941 (74 y.o. Male) Treating RN: Primary Care Physician: Apolonio Schneiders Other Clinician: Referring Physician: Apolonio Schneiders Treating Physician/Extender: Frann Rider in Treatment: 6 Constitutional . Pulse regular. Respirations normal and unlabored. Afebrile. . Eyes Nonicteric. Reactive to light. Ears, Nose, Mouth, and Throat Lips, teeth, and gums WNL.Marland Kitchen Moist mucosa without lesions . Neck supple and nontender. No palpable supraclavicular or cervical adenopathy. Normal sized without goiter. Respiratory WNL. No retractions.. Cardiovascular Pedal Pulses WNL. No clubbing, cyanosis or edema. Chest Breasts symmetical and no nipple discharge.. Breast tissue WNL, no masses, lumps, or tenderness.. Musculoskeletal Adexa without tenderness or enlargement.. Digits and nails w/o clubbing, cyanosis, infection, petechiae, ischemia, or inflammatory conditions.. Integumentary (Hair, Skin) wound looks  excellent with minimal open area and most of it is up to capitalize well.. No crepitus or fluctuance. No peri-wound warmth or erythema. No masses.Marland Kitchen Psychiatric Judgement and insight Intact.. No evidence of depression, anxiety, or agitation.Marland Kitchen  Electronic Signature(s) Signed: 05/04/2015 1:29:55 PM By: Christin Fudge MD, FACS Entered By: Christin Fudge on 05/04/2015 08:27:42 [name] [name] ([id number]) -------------------------------------------------------------------------------- Physician Orders Details Patient Name: [name] [name] Date of Service: 05/04/2015 8:15 AM Medical Record Number: [id number] Patient Account Number: [id number] Date of Birth/Sex: 1940/12/16 (74 y.o. Male) Treating RN: Junious Dresser Primary Care Physician: Apolonio Schneiders Other Clinician: Referring Physician: Apolonio Schneiders Treating Physician/Extender: Frann Rider in Treatment: 6 Verbal / Phone Orders: Yes Clinician: Junious Dresser Read Back and Verified: Yes Diagnosis Coding Wound Cleansing Wound #1 Left,Lateral Calcaneous o Clean wound with Normal Saline. Anesthetic Wound #1 Left,Lateral Calcaneous o Topical Lidocaine 4% cream applied to wound bed prior to debridement Primary Wound Dressing Wound #1 Left,Lateral Calcaneous o Prisma Ag Secondary Dressing Wound #1 Left,Lateral Calcaneous o Boardered Foam Dressing Dressing Change Frequency Wound #1 Left,Lateral Calcaneous o Change dressing every day. Follow-up Appointments Wound #1 Left,Lateral Calcaneous o Return Appointment in 2 weeks. Electronic Signature(s) Signed: 05/04/2015 1:29:55 PM By: Christin Fudge MD, FACS Signed: 05/04/2015 3:19:03 PM By: Junious Dresser RN Entered By: Junious Dresser on 05/04/2015 08:25:42 [name] [name] ([id number]) -------------------------------------------------------------------------------- Problem List Details Patient Name: [name] [name] Date of Service: 05/04/2015 8:15 AM Medical Record Number:  [id number] Patient Account Number: [id number] Date of Birth/Sex: [birth year] (74 y.o. 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 Male) Treating RN: Primary Care Physician: Apolonio Schneiders Other Clinician: Referring Physician: Apolonio Schneiders Treating Physician/Extender: Frann Rider in Treatment: 6 Subjective Chief Complaint Information obtained from Patient Patient presents to the wound care center for a consult due non healing wound pleasant 73 year old gentleman who comes with a ulcerated area on the left lateral heel and he's had it for about a month. History of Present Illness (HPI) 74 year old gentleman who comes as a self-referral and has been known to be a diabetic on treatment for the last 30 years and takes insulin for this. Developed a callus on the left lateral heel and does not know exactly what type of footwear cause this. Did see his dermatologist but prescribed some medication for him and also applied some Bactroban cream. Able to soften this area and remove some of the dead skin but he now has a definite ulcer there and knowing he's been a  diabetic he came as a self-referral for further treatment. He's been a nonsmoker all his life and hasn't had no problems with any arterial disease and has no workup done for this. 04/06/2015 -- he has been doing his dressing on alternate days but sometimes he runs that the dressing doesn't stay on for long. He also informs me that he is going to be out on vacation this weekend and will be back only in 2 weeks. 04/27/2015 -- his blood sugars under good control and he says he's been offloading as much as possible and has no pain whatsoever. 05/04/2015 -- he has been doing  very well his blood sugars are well under control and his offloading and changing of dressing has been very compliant. Objective Constitutional Pulse regular. Respirations normal and unlabored. Afebrile. Vitals Time Taken: 8:10 AM, Height: 71 in, Weight: 180 lbs, BMI: 25.1, Temperature: 97.8 F, Pulse: 64 bpm, Respiratory Rate: 18 breaths/min, Blood Pressure: 116/60 mmHg. KAILO, KOSIK ([id number]) Eyes Nonicteric. Reactive to light. Ears, Nose, Mouth, and Throat Lips, teeth, and gums WNL.Marland Kitchen Moist mucosa without lesions . Neck supple and nontender. No palpable supraclavicular or cervical adenopathy. Normal sized without goiter. Respiratory WNL. No retractions.. Cardiovascular Pedal Pulses WNL. No clubbing, cyanosis or edema. Chest Breasts symmetical and no nipple discharge.. Breast tissue WNL, no masses, lumps, or tenderness.. Musculoskeletal Adexa without tenderness or enlargement.. Digits and nails w/o clubbing, cyanosis, infection, petechiae, ischemia, or inflammatory conditions.Marland Kitchen Psychiatric Judgement and insight Intact.. No evidence of depression, anxiety, or agitation.. Integumentary (Hair, Skin) wound looks excellent with minimal open area and most of it is up to capitalize well.. No crepitus or fluctuance. No peri-wound warmth or erythema. No masses.. Wound #1 status is Open. Original cause of wound was  Gradually Appeared. The wound is located on the Left,Lateral Calcaneous. The wound measures 0.5cm length x 0.7cm width x 0.1cm depth; 0.275cm^2 area and 0.027cm^3 volume. The wound is limited to skin breakdown. There is no tunneling or undermining noted. There is a small amount of serous drainage noted. The wound margin is flat and intact. There is large (67-100%) red, pink granulation within the wound bed. There is a small (1-33%) amount of necrotic tissue within the wound bed including Adherent Slough. The periwound skin appearance exhibited: Callus, Moist. The periwound skin appearance did not exhibit: Crepitus, Excoriation, Fluctuance, Friable, Induration, Localized Edema, Rash, Scarring, Dry/Scaly, Maceration, Atrophie Blanche, Cyanosis, Ecchymosis, Hemosiderin Staining, Mottled, Pallor, Rubor, Erythema. Periwound temperature was noted as No Abnormality. 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 He is going to be away this having week on travel and will come back and see [place] in 2 weeks Plan Wound Cleansing: Wound #1 Left,Lateral Calcaneous: Clean wound with Normal Saline. Anesthetic: Wound #1 Left,Lateral Calcaneous: Topical Lidocaine 4% cream applied to wound bed prior to debridement Primary Wound Dressing: Wound #1 Left,Lateral Calcaneous: Prisma Ag Secondary Dressing: Wound #1 Left,Lateral Calcaneous: Boardered Foam Dressing Dressing Change Frequency: Wound #1 Left,Lateral Calcaneous: Change dressing every day. Follow-up Appointments: Wound #1 Left,Lateral  Calcaneous: Return Appointment in 2 weeks. Follow-Up Appointments: A Patient Clinical Summary of Care was provided to [facility], CALLEN VANCUREN. ([id number]) He has done very well over the last week with Prisma AG and offloading and we will continue the same. He is going to be away this having week on travel and will come back and see [place] in 2 weeks. Electronic Signature(s) Signed: 05/04/2015 3:03:49 PM By: Junious Dresser RN Signed: 05/04/2015 3:17:08 PM By: Christin Fudge MD, FACS Previous Signature: 05/04/2015 1:29:55 PM Version By: Christin Fudge MD, FACS Entered By: Junious Dresser on 05/04/2015 15:03:48 [name] [name] ([id number]) -------------------------------------------------------------------------------- SuperBill Details Patient Name: [name] [name] Date of Service: 05/04/2015 Medical Record Number: [id number] Patient Account  Number: [id number] Date of Birth/Sex: 1941-06-13 (74 y.o. 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

## 2015-05-18 ENCOUNTER — Encounter: Payer: Medicare Other | Attending: Surgery | Admitting: Surgery

## 2015-05-18 DIAGNOSIS — Z794 Long term (current) use of insulin: Secondary | ICD-10-CM | POA: Insufficient documentation

## 2015-05-18 DIAGNOSIS — L97421 Non-pressure chronic ulcer of left heel and midfoot limited to breakdown of skin: Secondary | ICD-10-CM | POA: Diagnosis not present

## 2015-05-18 DIAGNOSIS — E11621 Type 2 diabetes mellitus with foot ulcer: Secondary | ICD-10-CM | POA: Insufficient documentation

## 2015-05-18 NOTE — Progress Notes (Addendum)
DERREL, MOORE ([id number]) Visit Report for 05/18/2015 Chief Complaint Document Details Patient Name: [name] [name], [name] [name] Date of Service: 05/18/2015 8:00 AM Medical Record Number: [id number] Patient Account Number: [id number] Date of Birth/Sex: 1941/05/09 (74 y.o. Male) Treating RN: Primary Care Physician: Apolonio Schneiders Other Clinician: Referring Physician: Apolonio Schneiders Treating Physician/Extender: Frann Rider in Treatment: 8 Information Obtained from: Patient Chief Complaint Patient presents to the wound care center for a consult due non healing wound pleasant 74 year old gentleman who comes with a ulcerated area on the left lateral heel and he's had it for about a month. Electronic Signature(s) Signed: 05/18/2015 12:36:02 PM By: Christin Fudge MD, FACS Entered By: Christin Fudge on 05/18/2015 08:51:24 [name] [name] ([id number]) -------------------------------------------------------------------------------- Debridement Details Patient Name: [name] [name] Date of Service: 05/18/2015 8:00 AM Medical Record Number: [id number] Patient Account Number: [id number] Date of Birth/Sex: [birth year] (74 y.o. Male) Treating RN: Primary Care Physician: Apolonio Schneiders Other Clinician: Referring Physician: Apolonio Schneiders Treating Physician/Extender: Frann Rider in Treatment: 8 Debridement Performed for Wound #1 Left,Lateral Calcaneous Assessment: Performed By: Physician Pat Patrick., MD Debridement: Open Wound/Selective Debridement Selective Description: Pre-procedure Yes Verification/Time Out Taken: Start Time: 08:33 Pain Control: Lidocaine 4% Topical Solution Level: Non-Viable Tissue Total Area Debrided (L x 1.5 (cm) x 1.3 (cm) = 1.95 (cm) W): Tissue and other Non-Viable, Callus, Eschar, Fibrin/Slough, Skin material debrided: Instrument: Curette Bleeding: Minimum Hemostasis Achieved: Pressure End Time: 08:39 Procedural Pain: 2 Post Procedural Pain: 0 Response to  Treatment: Procedure was tolerated well Post Debridement Measurements of Total Wound Length: (cm) 1.5 Width: (cm) 1.3 Depth: (cm) 0.1 Volume: (cm) 0.153 Electronic Signature(s) Signed: 05/18/2015 12:36:02 PM By: Christin Fudge MD, FACS Entered By: Christin Fudge on 05/18/2015 08:51:17 [name] [name] ([id number]) -------------------------------------------------------------------------------- HPI Details Patient Name: [name] [name] Date of Service: 05/18/2015 8:00 AM Medical Record Number: [id number] Patient Account Number: [id number] Date of Birth/Sex: September 21, 1941 (74 y.o. Male) Treating RN: Primary Care Physician: Apolonio Schneiders Other Clinician: Referring Physician: Apolonio Schneiders Treating Physician/Extender: Frann Rider in Treatment: 8 History of Present Illness HPI Description: 74 year old gentleman who comes as a self-referral and has been known to be a diabetic on treatment for the last 30 years and takes insulin for this. Developed a callus on the left lateral heel and does not know exactly what type of footwear cause this. Did see his dermatologist but prescribed some medication for him and also applied some Bactroban cream. Able to soften this area and remove some of the dead skin but he now has a definite ulcer there and knowing he's been a diabetic he came as a self-referral for further treatment. He's been a nonsmoker all his life and hasn't had no problems with any arterial disease and has no workup done for this. 04/06/2015 -- he has been doing his dressing on alternate days but sometimes he runs that the dressing doesn't stay on for long. He also informs me that he is going to be out on vacation this weekend and will be back only in 2 weeks. 04/27/2015 -- his blood sugars under good control and he says he's been offloading as much as possible and has no pain whatsoever. 05/04/2015 -- he has been doing very well his blood sugars are well under control and his  offloading and changing of dressing has been very compliant. 05/18/2015 - Due to some family issues he's not been seen back for 2 weeks. he ran out of his Prisma dressing and went and bought a duodenum type  of dressing form the pharmacy and has used it for the last 48 hours. He started getting a minimal swelling in his leg and had noticed a change in his wound. Electronic Signature(s) Signed: 05/18/2015 12:36:02 PM By: Christin Fudge MD, FACS Entered By: Christin Fudge on 05/18/2015 08:52:38 [name] [name] ([id number]) -------------------------------------------------------------------------------- Physical Exam Details Patient Name: [name] [name] Date of Service: 05/18/2015 8:00 AM Medical Record Number: [id number] Patient Account Number: [id number] Date of Birth/Sex: 1941-01-16 (74 y.o. Male) Treating RN: Primary Care Physician: Apolonio Schneiders Other Clinician: Referring Physician: Apolonio Schneiders Treating Physician/Extender: Frann Rider in Treatment: 8 Constitutional . Pulse regular. Respirations normal and unlabored. Afebrile. . Eyes Nonicteric. Reactive to light. Ears, Nose, Mouth, and Throat Lips, teeth, and gums WNL.Marland Kitchen Moist mucosa without lesions . Neck supple and nontender. No palpable supraclavicular or cervical adenopathy. Normal sized without goiter. Respiratory WNL. No retractions.. Cardiovascular Pedal Pulses WNL. No clubbing, cyanosis or edema. Integumentary (Hair, Skin) The wound on the left heel which was almost closed 2 weeks ago now has opened up with a larger size, maceration and possibly pressure injury due to his footwear. He will need sharp debridement today.Marland Kitchen No crepitus or fluctuance. No peri-wound warmth or erythema. No masses.Marland Kitchen Psychiatric Judgement and insight Intact.. No evidence of depression, anxiety, or agitation.. Electronic Signature(s) Signed: 05/18/2015 12:36:02 PM By: Christin Fudge MD, FACS Entered By: Christin Fudge on 05/18/2015 08:54:21 [name] [name] ([id number]) -------------------------------------------------------------------------------- Physician Orders Details Patient Name: [name] [name] Date of Service: 05/18/2015 8:00 AM Medical Record Number: [id number] Patient Account Number: [id number] Date of Birth/Sex: 1940-12-23 (74 y.o. 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 Anesthetic Wound #1 Left,Lateral Calcaneous o Topical Lidocaine 4% cream applied to wound bed prior to debridement Primary Wound Dressing Wound #1 Left,Lateral Calcaneous o Prisma Ag Secondary Dressing Wound #1 Left,Lateral Calcaneous o Boardered Foam Dressing Dressing Change Frequency Wound #1 Left,Lateral Calcaneous o Change dressing every day. Follow-up Appointments Wound #1 Left,Lateral Calcaneous o Return Appointment in 1 week. Off-Loading Wound #1 Left,Lateral Calcaneous o Open toe surgical shoe with peg assist. BAILEE, THALL ([id number]) Electronic Signature(s) Signed: 05/18/2015 12:36:02 PM By: Christin Fudge MD, FACS Signed: 05/18/2015 4:43:53 PM By: Junious Dresser RN Entered By: Junious Dresser on 05/18/2015 08:41:48 [name] [name] ([id number]) -------------------------------------------------------------------------------- Problem List Details Patient Name: [name] [name] Date of Service: 05/18/2015 8:00 AM Medical Record Number: [id number] Patient Account Number: [id number] Date of Birth/Sex: 1941-11-06 (74 y.o. 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 Male) Treating RN: Primary Care Physician: Apolonio Schneiders Other Clinician: Referring Physician: Apolonio Schneiders Treating Physician/Extender: Frann Rider in Treatment: 8 Subjective Chief Complaint Information obtained from Patient Patient presents to the wound care center for a consult due non healing wound pleasant 74 year old gentleman who comes with a ulcerated area on the left lateral heel and he's had it for about a month. History of Present Illness (HPI) 74 year old gentleman who comes as a self-referral and has been known to be a diabetic on treatment for the last 30 years and takes insulin for this. Developed a callus on the left lateral heel and does not know exactly what type of footwear cause this. Did see his dermatologist but prescribed some medication for him and also applied some Bactroban cream. Able to soften this area and remove some of the dead skin but he now has a definite ulcer there and knowing he's been a diabetic he came as a self-referral for further treatment. He's been a nonsmoker all his life and  hasn't had no problems with any arterial disease and has no workup done for this. 04/06/2015 -- he has been doing his dressing on alternate days but sometimes he runs that the dressing doesn't stay on for long. He also informs me that he is going to be out on vacation this weekend and will be back only in 2 weeks. 04/27/2015 -- his blood sugars under good control and he says he's been offloading as much as possible and has no pain whatsoever. 05/04/2015 -- he has been doing very well his blood sugars are well under control and his offloading and changing of dressing has been very compliant. 05/18/2015 - Due to some family issues he's not been seen back for 2 weeks. he ran out of his Prisma dressing and went and bought a duodenum type of dressing form the pharmacy and has used it for the last 48 hours. He started getting a minimal swelling in his leg and had noticed a change in his wound. Objective Constitutional Pulse regular. Respirations normal and unlabored. Afebrile. [name] [name], [name] [name] ([id number]) Vitals Time Taken: 8:12 AM, Height: 71 in, Weight: 180 lbs, BMI: 25.1, Temperature: 97.8 F, Pulse: 59 bpm, Respiratory Rate: 16 breaths/min, Blood Pressure: 105/48 mmHg. Eyes Nonicteric. Reactive to light. Ears, Nose, Mouth, and Throat Lips, teeth, and gums WNL.Marland Kitchen Moist mucosa without lesions . Neck supple and nontender. No palpable supraclavicular or cervical adenopathy. Normal sized without goiter. Respiratory WNL. No retractions.. Cardiovascular Pedal Pulses WNL. No clubbing, cyanosis or edema. Psychiatric Judgement and insight Intact.. No evidence of depression, anxiety, or agitation.. Integumentary (Hair, Skin) The wound on the left heel which was almost closed 2 weeks ago now has opened up with a larger size, maceration and possibly pressure injury due to his footwear. He will need sharp debridement today.Marland Kitchen No crepitus or fluctuance. No peri-wound warmth or erythema. No  masses.. Wound #1 status is Open. Original cause of wound was Gradually Appeared. The wound is located on the Left,Lateral Calcaneous. The wound measures 1.5cm length x 1.3cm width x 0.1cm depth; 1.532cm^2 area and 0.153cm^3 volume. The wound is limited to skin breakdown. There is no tunneling or undermining noted. There is a small amount of serous drainage noted. The wound margin is flat and intact. There is large (67-100%) red, pink granulation within the wound bed. There is a small (1-33%) amount of necrotic tissue within the wound bed including Adherent Slough. The periwound skin appearance exhibited: Callus, Moist. The periwound skin  appearance did not exhibit: Crepitus, Excoriation, Fluctuance, Friable, Induration, Localized Edema, Rash, Scarring, Dry/Scaly, Maceration, Atrophie Blanche, Cyanosis, Ecchymosis, Hemosiderin Staining, Mottled, Pallor, Rubor, Erythema. Periwound temperature was noted as No Abnormality. The wound on the left heel which was almost closed 2 weeks ago now has opened up with a larger size, maceration and possibly pressure injury due to his footwear. He will need sharp debridement today. 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 I have spent a long time discussing with him the need for a proper offloading of his heel and have given him various methods to do this. We will also use a PEG assist boot. I will use Prisma over the wound and a foam  border and see him back next week. He says he is going to be compliant. Procedures Wound #1 Wound #1 is a Diabetic Wound/Ulcer of the Lower Extremity located on the Left,Lateral Calcaneous . There was a Non-Viable Tissue Open Wound/Selective [id number]) debridement with total area of 1.95 sq cm performed by Pat Patrick., MD. with the following instrument(s): Curette to remove Non-Viable tissue/material including Fibrin/Slough, Eschar, Skin, and Callus after achieving pain control using Lidocaine 4% Topical Solution. A time out was conducted prior to the start of the procedure. A Minimum amount of bleeding was controlled with Pressure. The procedure was tolerated well with a pain level of 2 throughout and a pain level of 0 following the procedure. Post Debridement Measurements: 1.5cm length x 1.3cm width x 0.1cm depth; 0.153cm^3 volume. Plan Wound Cleansing: Wound #1 Left,Lateral Calcaneous: Clean wound with Normal Saline. Anesthetic: Wound #1 Left,Lateral Calcaneous: [name] [name], [name] [name] ([id number]) Topical Lidocaine 4% cream applied to wound bed prior to debridement Primary Wound Dressing: Wound #1 Left,Lateral Calcaneous: Prisma Ag Secondary Dressing: Wound #1 Left,Lateral Calcaneous: Boardered Foam Dressing Dressing Change Frequency: Wound #1 Left,Lateral Calcaneous: Change dressing every day. Follow-up Appointments: Wound #1 Left,Lateral Calcaneous: Return Appointment in 1 week. Off-Loading: Wound #1 Left,Lateral Calcaneous: Open toe surgical shoe with peg assist. Follow-Up Appointments: A follow-up appointment should be scheduled. The patient has taken a step backward as far as her wound healing goes and now the size is larger and there is a lot of maceration possibly due to not using the right dressing material and excessive Walking. I have spent a long time discussing with him the need for a proper offloading of his heel and have given him various methods to do this.  We will also use a PEG assist boot. I will use Prisma over the wound and a foam border and see him back next week. He says he is going to be compliant. Electronic Signature(s) Signed: 05/25/2015 9:49:01 AM By: Junious Dresser RN Signed: 05/26/2015 4:45:54 PM By: Christin Fudge MD, FACS Previous Signature: 05/18/2015 12:36:02 PM Version By: Christin Fudge MD, FACS Entered By: Junious Dresser on 05/25/2015 09:49:01 [name] [name] ([id number]) -------------------------------------------------------------------------------- SuperBill Details Patient Name: [name] [name] Date of Service: 05/18/2015 Medical Record Number: [id number] Patient Account Number: [id number] Date of Birth/Sex:  1941-05-07 (74 y.o. 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 Facility Procedures CPT4: Description Modifier Quantity Code [id number] - DEBRIDE WOUND 1ST 20 SQ CM OR < 1 ICD-10 Description Diagnosis E11.621 Type 2 diabetes mellitus with foot ulcer 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 to breakdown of  skin Physician Procedures CPT4: Description Modifier Quantity Code [id number] 40370 - WC PHYS LEVEL 3 - EST PT 25 1 ICD-10 Description Diagnosis E11.621 Type 2 diabetes mellitus with foot 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

## 2015-05-18 NOTE — Progress Notes (Signed)
[name] [name] ([id number]) Visit Report for 05/18/2015 Arrival Information Details Patient Name: [name] [name] Date of Service: 05/18/2015 8:00 AM Medical Record Number: [id number] Patient Account Number: [id number] Date of Birth/Sex: 1941/08/18 (73 y.o. Male) Treating RN: Montey Hora Primary Care Physician: Apolonio Schneiders Other Clinician: Referring Physician: Apolonio Schneiders Treating Physician/Extender: Frann Rider in Treatment: 8 Visit Information History Since Last Visit Added or deleted any medications: No Patient Arrived: Ambulatory Any new allergies or adverse reactions: No Arrival Time: 08:03 Had a fall or experienced change in No Accompanied By: self activities of daily living that may affect Transfer Assistance: None risk of falls: Patient Identification Verified: Yes Signs or symptoms of abuse/neglect since last No Secondary Verification Process Yes visito Completed: Hospitalized since last visit: No Patient Has Alerts: Yes Pain Present Now: No Patient Alerts: Type II Diabetic AIC 6.2 ABI L 1.20 R 1.12 Electronic Signature(s) Signed: 05/18/2015 2:45:57 PM By: Montey Hora Entered By: Montey Hora on 05/18/2015 08:11:58 [name] [name] ([id number]) -------------------------------------------------------------------------------- Encounter Discharge Information Details Patient Name: [name] [name] Date of Service: 05/18/2015 8:00 AM Medical Record Number: [id number] Patient Account Number: [id number] Date of Birth/Sex: [birth year] (74 y.o. Male) Treating RN: Montey Hora Primary Care Physician: Apolonio Schneiders Other Clinician: Referring Physician: Apolonio Schneiders Treating Physician/Extender: Frann Rider in Treatment: 8 Encounter Discharge Information Items Discharge Pain Level: 0 Discharge Condition: Stable Ambulatory Status: Ambulatory Discharge Destination: Home Private Transportation: Auto Accompanied By: self Schedule Follow-up Appointment:  Yes Medication Reconciliation completed and No provided to Patient/Care [name] [name]: Clinical Summary of Care: Electronic Signature(s) Signed: 05/18/2015 2:45:57 PM By: Montey Hora Entered By: Montey Hora on 05/18/2015 09:06:06 [name] [name] ([id number]) -------------------------------------------------------------------------------- Lower Extremity Assessment Details Patient Name: [name] [name] Date of Service: 05/18/2015 8:00 AM Medical Record Number: [id number] Patient Account Number: [id number] Date of Birth/Sex: 10-24-1941 (74 y.o. Male) Treating RN: Montey Hora Primary Care Physician: Apolonio Schneiders Other Clinician: Referring Physician: Apolonio Schneiders Treating Physician/Extender: Frann Rider in Treatment: 8 Edema Assessment Assessed: [name] [name]: No] [Right: No] Edema: [Left: Ye] [Right: s] Vascular Assessment Pulses: Posterior Tibial Palpable: [Left:Yes] Dorsalis Pedis Palpable: [Left:Yes] Extremity colors, hair growth, and conditions: Extremity Color: [Left:Normal] Hair Growth on Extremity: [Left:Yes] Temperature of Extremity: [Left:Cool] Capillary Refill: [Left:< 3 seconds] Toe Nail Assessment Left: Right: Thick: No Discolored: No Deformed: No Improper Length and Hygiene: No Electronic Signature(s) Signed: 05/18/2015 2:45:57 PM By: Montey Hora Entered By: Montey Hora on 05/18/2015 08:13:33 [name] [name] ([id number]) -------------------------------------------------------------------------------- Multi Wound Chart Details Patient Name: [name] [name] Date of Service: 05/18/2015 8:00 AM Medical Record Number: [id number] Patient Account Number: [id number] Date of Birth/Sex: September 27, 1941 (74 y.o. Male) Treating RN: Junious Dresser Primary Care Physician: Apolonio Schneiders Other Clinician: Referring Physician: Apolonio Schneiders Treating Physician/Extender: Frann Rider in Treatment: 8 Vital Signs Height(in): 71 Pulse(bpm): 59 Weight(lbs): 180 Blood  Pressure 105/48 (mmHg): Body Mass Index(BMI): 25 Temperature(F): 97.8 Respiratory Rate 16 (breaths/min): Photos: [1:No Photos] [N/A:N/A] Wound Location: [1:Left Calcaneous - Lateral] [N/A:N/A] Wounding Event: [1:Gradually Appeared] [N/A:N/A] Primary Etiology: [1:Diabetic Wound/Ulcer of the Lower Extremity] [N/A:N/A] Comorbid History: [1:Type II Diabetes] [N/A:N/A] Date Acquired: [1:02/12/2015] [N/A:N/A] Weeks of Treatment: [1:8] [N/A:N/A] Wound Status: [1:Open] [N/A:N/A] Measurements L x W x D 1.5x1.3x0.1 [N/A:N/A] (cm) Area (cm) : [1:1.532] [N/A:N/A] Volume (cm) : [1:0.153] [N/A:N/A] % Reduction in Area: [1:-74.10%] [N/A:N/A] % Reduction in Volume: -73.90% [N/A:N/A] Classification: [1:Grade 1] [N/A:N/A] Exudate Amount: [1:Small] [N/A:N/A] Exudate Type: [1:Serous] [N/A:N/A] Exudate Color: [1:amber] [N/A:N/A] Wound Margin: [1:Flat and Intact] [N/A:N/A] Granulation Amount: [1:Large (  67-100%)] [N/A:N/A] Granulation Quality: [1:Red, Pink] [N/A:N/A] Necrotic Amount: [1:Small (1-33%)] [N/A:N/A] Exposed Structures: [1:Fascia: No Fat: No Tendon: No Muscle: No Joint: No Bone: No] [N/A:N/A] Limited to Skin Breakdown Epithelialization: Large (67-100%) N/A N/A Periwound Skin Texture: Callus: Yes N/A N/A Edema: No Excoriation: No Induration: No Crepitus: No Fluctuance: No Friable: No Rash: No Scarring: No Periwound Skin Moist: Yes N/A N/A Moisture: Maceration: No Dry/Scaly: No Periwound Skin Color: Atrophie Blanche: No N/A N/A Cyanosis: No Ecchymosis: No Erythema: No Hemosiderin Staining: No Mottled: No Pallor: No Rubor: No Temperature: No Abnormality N/A N/A Tenderness on No N/A N/A Palpation: Wound Preparation: Ulcer Cleansing: N/A N/A Rinsed/Irrigated with Saline Topical Anesthetic Applied: Other: lidocaine 4% Treatment Notes Electronic Signature(s) Signed: 05/18/2015 4:43:53 PM By: Junious Dresser RN Entered By: Junious Dresser on 05/18/2015 08:27:25 [name] [name] ([id number]) -------------------------------------------------------------------------------- [place] Details Patient Name: [name] [name] Date of Service: 05/18/2015 8:00 AM Medical Record Number: [id number] Patient Account Number: [id number] Date of Birth/Sex: [birth year] (74 y.o. Male) Treating RN: Junious Dresser Primary Care Physician: Apolonio Schneiders Other Clinician: Referring Physician: Apolonio Schneiders Treating Physician/Extender: Frann Rider in Treatment: 8 Active Inactive Orientation to the Wound Care Program Nursing Diagnoses: Knowledge deficit related to the wound healing center program Goals: Patient/caregiver will verbalize understanding of the [place] Program Date Initiated: 03/23/2015 Goal Status: Active Interventions: Provide education on orientation to the wound center Notes: Wound/Skin Impairment Nursing Diagnoses: Knowledge deficit related to ulceration/compromised skin integrity Goals: Patient/caregiver will verbalize understanding of skin care regimen Date Initiated: 03/23/2015 Goal Status: Active Ulcer/skin breakdown will heal within 14 weeks Date Initiated: 03/23/2015 Goal Status: Active Interventions: Assess patient/caregiver ability to obtain necessary supplies Assess patient/caregiver ability to perform ulcer/skin care regimen upon admission and as needed Assess ulceration(s) every visit Provide education on ulcer and skin care Treatment Activities: Skin care regimen initiated : 05/18/2015 Topical wound management initiated : 05/18/2015 TAYON, PAREKH ([id number]) Notes: Electronic Signature(s) Signed: 05/18/2015 4:43:53 PM By: Junious Dresser RN Entered By: Junious Dresser on 05/18/2015 08:27:14 [name] [name] ([id number]) -------------------------------------------------------------------------------- Patient/Caregiver Education Details Patient Name: [name] [name] Date of Service: 05/18/2015 8:00 AM Medical  Record Number: [id number] Patient Account Number: [id number] Date of Birth/Gender: 1941-03-01 (74 y.o. Male) Treating RN: Junious Dresser Primary Care Physician: Apolonio Schneiders Other Clinician: Referring Physician: Apolonio Schneiders Treating Physician/Extender: Frann Rider in Treatment: 8 Education Assessment Education Provided To: Patient Education Topics Provided Offloading: Methods: Explain/Verbal Responses: State content correctly Pressure: Methods: Explain/Verbal Responses: State content correctly Wound Debridement: Methods: Explain/Verbal Responses: State content correctly Wound/Skin Impairment: Methods: Explain/Verbal Responses: State content correctly Electronic Signature(s) Signed: 05/18/2015 4:43:53 PM By: Junious Dresser RN Entered By: Junious Dresser on 05/18/2015 08:44:18 [name] [name] ([id number]) -------------------------------------------------------------------------------- Wound Assessment Details Patient Name: [name] [name] Date of Service: 05/18/2015 8:00 AM Medical Record Number: [id number] Patient Account Number: [id number] Date of Birth/Sex: 01/30/1941 (74 y.o. Male) Treating RN: Montey Hora Primary Care Physician: Apolonio Schneiders Other Clinician: Referring Physician: Apolonio Schneiders Treating Physician/Extender: Frann Rider in Treatment: 8 Wound Status Wound Number: 1 Primary Diabetic Wound/Ulcer of the Lower Etiology: Extremity Wound Location: Left Calcaneous - Lateral Wound Status: Open Wounding Event: Gradually Appeared Comorbid Type II Diabetes Date Acquired: 02/12/2015 History: Weeks Of Treatment: 8 Clustered Wound: No Photos Photo Uploaded By: Montey Hora on 05/18/2015 12:44:43 Wound Measurements Length: (cm) 1.5 Width: (cm) 1.3 Depth: (cm) 0.1 Area: (cm) 1.532 Volume: (cm) 0.153 % Reduction in Area: -74.1% % Reduction in Volume: -73.9% Epithelialization: Large (  67-100%) Tunneling: No Undermining: No Wound  Description Classification: Grade 1 Wound Margin: Flat and Intact Exudate Amount: Small Exudate Type: Serous Exudate Color: amber Foul Odor After Cleansing: No Wound Bed Granulation Amount: Large (67-100%) Exposed Structure Granulation Quality: Red, Pink Fascia Exposed: No Necrotic Amount: Small (1-33%) Fat Layer Exposed: No Necrotic Quality: Adherent Slough Tendon Exposed: No ROSTON, GRUNEWALD. ([id number]) Muscle Exposed: No Joint Exposed: No Bone Exposed: No Limited to Skin Breakdown Periwound Skin Texture Texture Color No Abnormalities Noted: No No Abnormalities Noted: No Callus: Yes Atrophie Blanche: No Crepitus: No Cyanosis: No Excoriation: No Ecchymosis: No Fluctuance: No Erythema: No Friable: No Hemosiderin Staining: No Induration: No Mottled: No Localized Edema: No Pallor: No Rash: No Rubor: No Scarring: No Temperature / Pain Moisture Temperature: No Abnormality No Abnormalities Noted: No Dry / Scaly: No Maceration: No Moist: Yes Wound Preparation Ulcer Cleansing: Rinsed/Irrigated with Saline Topical Anesthetic Applied: Other: lidocaine 4%, Treatment Notes Wound #1 (Left, Lateral Calcaneous) 1. Cleansed with: Clean wound with Normal Saline 2. Anesthetic Topical Lidocaine 4% cream to wound bed prior to debridement 3. Peri-wound Care: Skin Prep 4. Dressing Applied: Prisma Ag 5. Secondary Dressing Applied Bordered Foam Dressing Notes darco shoe with peg assist Electronic Signature(s) Signed: 05/18/2015 2:45:57 PM By: Montey Hora Entered By: Montey Hora on 05/18/2015 08:16:58 [name] [name] ([id number]) [name] [name] ([id number]) -------------------------------------------------------------------------------- Vitals Details Patient Name: [name] [name] Date of Service: 05/18/2015 8:00 AM Medical Record Number: [id number] Patient Account Number: [id number] Date of Birth/Sex: 02-09-1941 (74 y.o. Male) Treating RN: Montey Hora Primary  Care Physician: Apolonio Schneiders Other Clinician: Referring Physician: Apolonio Schneiders Treating Physician/Extender: Frann Rider in Treatment: 8 Vital Signs Time Taken: 08:12 Temperature (F): 97.8 Height (in): 71 Pulse (bpm): 59 Weight (lbs): 180 Respiratory Rate (breaths/min): 16 Body Mass Index (BMI): 25.1 Blood Pressure (mmHg): 105/48 Reference Range: 80 - 120 mg / dl Electronic Signature(s) Signed: 05/18/2015 2:45:57 PM By: Montey Hora Entered By: Montey Hora on 05/18/2015 08:12:20

## 2015-05-20 NOTE — Progress Notes (Signed)
TYUS, KALLAM ([id number]) Visit Report for 03/23/2015 Allergy List Details Patient Name: GAHEL, SAFLEY Date of Service: 03/23/2015 8:45 AM Medical Record Number: [id number] Patient Account Number: [id number] Date of Birth/Sex: 11/13/1941 (73 y.o. Male) Treating RN: Cornell Barman Primary Care Physician: Apolonio Schneiders Other Clinician: Referring Physician: Apolonio Schneiders Treating Physician/Extender: Frann Rider in Treatment: 0 Allergies Active Allergies codeine Reaction: welps Severity: Severe Allergy Notes Electronic Signature(s) Signed: 03/23/2015 4:27:49 PM By: Gretta Cool, RN, BSN, Kim RN, BSN Entered By: Gretta Cool, RN, BSN, Kim on 03/23/2015 09:06:43 [name] ([id number]) -------------------------------------------------------------------------------- Arrival Information Details Patient Name: [name] Date of Service: 03/23/2015 8:45 AM Medical Record Number: [id number] Patient Account Number: [id number] Date of Birth/Sex: 1940-12-24 (74 y.o. Male) Treating RN: Cornell Barman Primary Care Physician: Apolonio Schneiders Other Clinician: Referring Physician: Apolonio Schneiders Treating Physician/Extender: Frann Rider in Treatment: 0 Visit Information Patient Arrived: Ambulatory Arrival Time: 09:04 Accompanied By: self Transfer Assistance: Manual Patient Identification Verified: Yes Secondary Verification Process Yes Completed: Patient Has Alerts: Yes Patient Alerts: Type II Diabetic AIC 6.2 03/23/15 ABI: (L) 1.20 (R) 1.12 Electronic Signature(s) Signed: 05/19/2015 3:14:50 PM By: Gretta Cool, RN, BSN, Kim RN, BSN Previous Signature: 03/23/2015 4:27:49 PM Version By: Gretta Cool RN, BSN, Kim RN, BSN Entered By: Gretta Cool, RN, BSN, Kim on 05/19/2015 15:14:50 [name] ([id number]) -------------------------------------------------------------------------------- Clinic Level of Care Assessment Details Patient Name: [name] Date of Service: 03/23/2015 8:45 AM Medical Record  Number: [id number] Patient Account Number: [id number] Date of Birth/Sex: 06-26-1941 (74 y.o. Male) Treating RN: Junious Dresser Primary Care Physician: Apolonio Schneiders Other Clinician: Referring Physician: Apolonio Schneiders Treating Physician/Extender: Frann Rider in Treatment: 0 Clinic Level of Care Assessment Items TOOL 1 Quantity Score []  - Use when EandM and Procedure is performed on INITIAL visit 0 ASSESSMENTS - Nursing Assessment / Reassessment []  - General Physical Exam (combine w/ comprehensive assessment (listed just 0 below) when performed on new pt. evals) X - Comprehensive Assessment (HX, ROS, Risk Assessments, Wounds Hx, etc.) 1 25 ASSESSMENTS - Wound and Skin Assessment / Reassessment []  - Dermatologic / Skin Assessment (not related to wound area) 0 ASSESSMENTS - Ostomy and/or Continence Assessment and Care []  - Incontinence Assessment and Management 0 []  - Ostomy Care Assessment and Management (repouching, etc.) 0 PROCESS - Coordination of Care X - Simple Patient / Family Education for ongoing care 1 15 []  - Complex (extensive) Patient / Family Education for ongoing care 0 X - Staff obtains Programmer, systems, Records, Test Results / Process Orders 1 10 []  - Staff telephones HHA, Nursing Homes / Clarify orders / etc 0 []  - Routine Transfer to another Facility (non-emergent condition) 0 []  - Routine Hospital Admission (non-emergent condition) 0 X - New Admissions / Biomedical engineer / Ordering NPWT, Apligraf, etc. 1 15 []  - Emergency Hospital Admission (emergent condition) 0 PROCESS - Special Needs []  - Pediatric / Minor Patient Management 0 []  - Isolation Patient Management 0 RHYKER, SILVERSMITH. ([id number]) []  - Hearing / Language / Visual special needs 0 []  - Assessment of Community assistance (transportation, D/C planning, etc.) 0 []  - Additional assistance / Altered mentation 0 []  - Support Surface(s) Assessment (bed, cushion, seat, etc.) 0 INTERVENTIONS - Miscellaneous []   - External ear exam 0 []  - Patient Transfer (multiple staff / Civil Service fast streamer / Similar devices) 0 []  - Simple Staple / Suture removal (25 or less) 0 []  - Complex Staple / Suture removal (26 or more) 0 []  - Hypo/Hyperglycemic Management (do not check if  billed separately) 0 X - Ankle / Brachial Index (ABI) - do not check if billed separately 1 15 Has the patient been seen at the hospital within the last three years: Yes Total Score: 80 Level Of Care: New/Established - Level 3 Electronic Signature(s) Signed: 03/23/2015 4:37:38 PM By: Junious Dresser RN Entered By: Junious Dresser on 03/23/2015 09:55:02 [name] ([id number]) -------------------------------------------------------------------------------- Encounter Discharge Information Details Patient Name: [name] Date of Service: 03/23/2015 8:45 AM Medical Record Number: [id number] Patient Account Number: [id number] Date of Birth/Sex: 10-12-1941 (74 y.o. Male) Treating RN: Junious Dresser Primary Care Physician: Apolonio Schneiders Other Clinician: Referring Physician: Apolonio Schneiders Treating Physician/Extender: Frann Rider in Treatment: 0 Encounter Discharge Information Items Discharge Pain Level: 0 Discharge Condition: Stable Ambulatory Status: Ambulatory Discharge Destination: Home Transportation: Private Auto Accompanied By: self Schedule Follow-up Appointment: Yes Medication Reconciliation completed and provided to Patient/Care Yes Keilen Kahl: Provided on Clinical Summary of Care: 03/23/2015 Form Type Recipient Paper Patient RO Electronic Signature(s) Signed: 03/23/2015 4:27:49 PM By: Gretta Cool, RN, BSN, Kim RN, BSN Previous Signature: 03/23/2015 9:03:54 AM Version By: Ruthine Dose Entered By: Gretta Cool RN, BSN, Kim on 03/23/2015 10:06:48 [name] ([id number]) -------------------------------------------------------------------------------- Lower Extremity Assessment Details Patient Name: [name] Date of Service:  03/23/2015 8:45 AM Medical Record Number: [id number] Patient Account Number: [id number] Date of Birth/Sex: January 12, 1941 (74 y.o. Male) Treating RN: Cornell Barman Primary Care Physician: Apolonio Schneiders Other Clinician: Referring Physician: Apolonio Schneiders Treating Physician/Extender: Frann Rider in Treatment: 0 Edema Assessment Assessed: Shirlyn Goltz: No] [Right: No] E[Left: dema] [Right: :] Calf Left: Right: Point of Measurement: 35 cm From Medial Instep 36.6 cm 36.5 cm Ankle Left: Right: Point of Measurement: 11 cm From Medial Instep 22.8 cm 21.3 cm Vascular Assessment Claudication: Claudication Assessment [Left:None] [Right:None] Pulses: Posterior Tibial Palpable: [Left:Yes] [Right:Yes] Doppler: [Left:Multiphasic] [Right:Multiphasic] Dorsalis Pedis Palpable: [Left:Yes] [Right:Yes] Doppler: [Left:Multiphasic] [Right:Multiphasic] Extremity colors, hair growth, and conditions: Extremity Color: [Left:Normal] [Right:Normal] Hair Growth on Extremity: [Left:Yes] [Right:Yes] Temperature of Extremity: [Left:Warm] [Right:Warm] Capillary Refill: [Left:< 3 seconds] [Right:< 3 seconds] Blood Pressure: Brachial: [Left:128] [Right:132] Dorsalis Pedis: 124 [Left:Dorsalis Pedis: 578] Ankle: Posterior Tibial: 158 [Left:Posterior Tibial: 148 1.20] [Right:1.12] Toe Nail Assessment Left: Right: Thick: No No Discolored: No No RYLEI, CODISPOTI ([id number]) Deformed: No No Improper Length and Hygiene: No No Electronic Signature(s) Signed: 03/23/2015 4:27:49 PM By: Gretta Cool, RN, BSN, Kim RN, BSN Entered By: Gretta Cool, RN, BSN, Kim on 03/23/2015 09:23:57 [name] ([id number]) -------------------------------------------------------------------------------- Multi Wound Chart Details Patient Name: [name] Date of Service: 03/23/2015 8:45 AM Medical Record Number: [id number] Patient Account Number: [id number] Date of Birth/Sex: 1941-11-20 (74 y.o. Male) Treating RN: Junious Dresser Primary Care  Physician: Apolonio Schneiders Other Clinician: Referring Physician: Apolonio Schneiders Treating Physician/Extender: Frann Rider in Treatment: 0 Vital Signs Height(in): 71 Pulse(bpm): 64 Weight(lbs): 180 Blood Pressure 124/58 (mmHg): Body Mass Index(BMI): 25 Temperature(F): 98.0 Respiratory Rate 16 (breaths/min): Photos: [N/A:N/A] Wound Location: Left Calcaneous - Lateral N/A N/A Wounding Event: Gradually Appeared N/A N/A Primary Etiology: Diabetic Wound/Ulcer of N/A N/A the Lower Extremity Comorbid History: Type II Diabetes N/A N/A Date Acquired: 02/12/2015 N/A N/A Weeks of Treatment: 0 N/A N/A Wound Status: Open N/A N/A Measurements L x W x D 1.4x0.8x0.1 N/A N/A (cm) Area (cm) : 0.88 N/A N/A Volume (cm) : 0.088 N/A N/A % Reduction in Area: 0.00% N/A N/A % Reduction in Volume: 0.00% N/A N/A Classification: Grade 1 N/A N/A Exudate Amount: Small N/A N/A Exudate Type: Serous N/A N/A Exudate Color:  amber N/A N/A Wound Margin: Flat and Intact N/A N/A Granulation Amount: Large (67-100%) N/A N/A Granulation Quality: Red, Pink N/A N/A Necrotic Amount: None Present (0%) N/A N/A BRENEN, BEIGEL. ([id number]) Exposed Structures: Fascia: No N/A N/A Fat: No Tendon: No Muscle: No Joint: No Bone: No Limited to Skin Breakdown Epithelialization: None N/A N/A Periwound Skin Texture: Callus: Yes N/A N/A Edema: No Excoriation: No Induration: No Crepitus: No Fluctuance: No Friable: No Rash: No Scarring: No Periwound Skin Dry/Scaly: Yes N/A N/A Moisture: Maceration: No Moist: No Periwound Skin Color: Atrophie Blanche: No N/A N/A Cyanosis: No Ecchymosis: No Erythema: No Hemosiderin Staining: No Mottled: No Pallor: No Rubor: No Temperature: No Abnormality N/A N/A Tenderness on No N/A N/A Palpation: Wound Preparation: Ulcer Cleansing: N/A N/A Rinsed/Irrigated with Saline Topical Anesthetic Applied: Other: lidocaine 4% Treatment Notes Electronic  Signature(s) Signed: 03/23/2015 4:37:38 PM By: Junious Dresser RN Entered By: Junious Dresser on 03/23/2015 09:38:38 [name] ([id number]) -------------------------------------------------------------------------------- [place] Details Patient Name: [name] Date of Service: 03/23/2015 8:45 AM Medical Record Number: [id number] Patient Account Number: [id number] Date of Birth/Sex: 1940/12/24 (74 y.o. Male) Treating RN: Junious Dresser Primary Care Physician: Apolonio Schneiders Other Clinician: Referring Physician: Apolonio Schneiders Treating Physician/Extender: Frann Rider in Treatment: 0 Active Inactive Orientation to the Wound Care Program Nursing Diagnoses: Knowledge deficit related to the wound healing center program Goals: Patient/caregiver will verbalize understanding of the [facility] Program Date Initiated: 03/23/2015 Goal Status: Active Interventions: Provide education on orientation to the wound center Notes: Wound/Skin Impairment Nursing Diagnoses: Knowledge deficit related to ulceration/compromised skin integrity Goals: Patient/caregiver will verbalize understanding of skin care regimen Date Initiated: 03/23/2015 Goal Status: Active Ulcer/skin breakdown will heal within 14 weeks Date Initiated: 03/23/2015 Goal Status: Active Interventions: Assess patient/caregiver ability to obtain necessary supplies Assess patient/caregiver ability to perform ulcer/skin care regimen upon admission and as needed Assess ulceration(s) every visit Provide education on ulcer and skin care Treatment Activities: Skin care regimen initiated : 03/23/2015 Topical wound management initiated : 03/23/2015 CONCEPCION, KIRKPATRICK ([id number]) Notes: Electronic Signature(s) Signed: 03/23/2015 4:37:38 PM By: Junious Dresser RN Entered By: Junious Dresser on 03/23/2015 09:38:21 [name]  ([id number]) -------------------------------------------------------------------------------- Pain Assessment Details Patient Name: [name] Date of Service: 03/23/2015 8:45 AM Medical Record Number: [id number] Patient Account Number: [id number] Date of Birth/Sex: 15-Jul-1941 (74 y.o. Male) Treating RN: Cornell Barman Primary Care Physician: Apolonio Schneiders Other Clinician: Referring Physician: Apolonio Schneiders Treating Physician/Extender: Frann Rider in Treatment: 0 Active Problems Location of Pain Severity and Description of Pain Patient Has Paino No Site Locations Pain Management and Medication Current Pain Management: Electronic Signature(s) Signed: 03/23/2015 4:27:49 PM By: Gretta Cool, RN, BSN, Kim RN, BSN Entered By: Gretta Cool, RN, BSN, Kim on 03/23/2015 09:05:59 [name] ([id number]) -------------------------------------------------------------------------------- Patient/Caregiver Education Details Patient Name: [name] Date of Service: 03/23/2015 8:45 AM Medical Record Number: [id number] Patient Account Number: [id number] Date of Birth/Gender: 1941-03-14 (74 y.o. Male) Treating RN: Cornell Barman Primary Care Physician: Apolonio Schneiders Other Clinician: Referring Physician: Apolonio Schneiders Treating Physician/Extender: Frann Rider in Treatment: 0 Education Assessment Education Provided To: Patient Education Topics Provided Welcome To [facility]: Handouts: Welcome To [facility] Methods: Demonstration, Explain/Verbal Responses: State content correctly Wound/Skin Impairment: Handouts: Caring for Your Ulcer, Skin Care Do's and Dont's Methods: Demonstration, Explain/Verbal Responses: State content correctly Electronic Signature(s) Signed: 03/23/2015 4:27:49 PM By: Gretta Cool, RN, BSN, Kim RN, BSN Entered By: Gretta Cool, RN, BSN, Kim on 03/23/2015  10:07:39 CORDARRELL, SANE  ([id number]) -------------------------------------------------------------------------------- Wound Assessment Details Patient Name: KERVENS, ROPER Date of Service: 03/23/2015 8:45 AM Medical Record Number: [id number] Patient Account Number: [id number] Date of Birth/Sex: 1941/02/01 (73 y.o. Male) Treating RN: Cornell Barman Primary Care Physician: Apolonio Schneiders Other Clinician: Referring Physician: Apolonio Schneiders Treating Physician/Extender: Frann Rider in Treatment: 0 Wound Status Wound Number: 1 Primary Diabetic Wound/Ulcer of the Lower Etiology: Extremity Wound Location: Left Calcaneous - Lateral Wound Status: Open Wounding Event: Gradually Appeared Comorbid Type II Diabetes Date Acquired: 02/12/2015 History: Weeks Of Treatment: 0 Clustered Wound: No Photos Photo Uploaded By: Montey Hora on 03/23/2015 09:29:34 Wound Measurements Length: (cm) 1.4 Width: (cm) 0.8 Depth: (cm) 0.1 Area: (cm) 0.88 Volume: (cm) 0.088 % Reduction in Area: 0% % Reduction in Volume: 0% Epithelialization: None Tunneling: No Undermining: No Wound Description Classification: Grade 1 Wound Margin: Flat and Intact Exudate Amount: Small Exudate Type: Serous Exudate Color: amber Foul Odor After Cleansing: No Wound Bed Granulation Amount: Large (67-100%) Exposed Structure Granulation Quality: Red, Pink Fascia Exposed: No Necrotic Amount: None Present (0%) Fat Layer Exposed: No Tendon Exposed: No JANSEN, SCIUTO. ([id number]) Muscle Exposed: No Joint Exposed: No Bone Exposed: No Limited to Skin Breakdown Periwound Skin Texture Texture Color No Abnormalities Noted: No No Abnormalities Noted: No Callus: Yes Atrophie Blanche: No Crepitus: No Cyanosis: No Excoriation: No Ecchymosis: No Fluctuance: No Erythema: No Friable: No Hemosiderin Staining: No Induration: No Mottled: No Localized Edema: No Pallor: No Rash: No Rubor: No Scarring: No Temperature / Pain Moisture  Temperature: No Abnormality No Abnormalities Noted: No Dry / Scaly: Yes Maceration: No Moist: No Wound Preparation Ulcer Cleansing: Rinsed/Irrigated with Saline Topical Anesthetic Applied: Other: lidocaine 4%, Electronic Signature(s) Signed: 03/23/2015 4:27:49 PM By: Gretta Cool, RN, BSN, Kim RN, BSN Entered By: Gretta Cool, RN, BSN, Kim on 03/23/2015 09:26:52 [name] ([id number]) -------------------------------------------------------------------------------- Vitals Details Patient Name: [name] Date of Service: 03/23/2015 8:45 AM Medical Record Number: [id number] Patient Account Number: [id number] Date of Birth/Sex: 10/22/1941 (74 y.o. Male) Treating RN: Cornell Barman Primary Care Physician: Apolonio Schneiders Other Clinician: Referring Physician: Apolonio Schneiders Treating Physician/Extender: Frann Rider in Treatment: 0 Vital Signs Time Taken: 09:08 Temperature (F): 98.0 Height (in): 71 Pulse (bpm): 64 Weight (lbs): 180 Respiratory Rate (breaths/min): 16 Body Mass Index (BMI): 25.1 Blood Pressure (mmHg): 124/58 Reference Range: 80 - 120 mg / dl Electronic Signature(s) Signed: 03/23/2015 4:27:49 PM By: Gretta Cool, RN, BSN, Kim RN, BSN Entered By: Gretta Cool, RN, BSN, Kim on 03/23/2015 09:08:55

## 2015-05-24 ENCOUNTER — Encounter: Payer: Medicare Other | Admitting: Surgery

## 2015-05-24 DIAGNOSIS — L97421 Non-pressure chronic ulcer of left heel and midfoot limited to breakdown of skin: Secondary | ICD-10-CM | POA: Diagnosis not present

## 2015-05-24 NOTE — Progress Notes (Signed)
[name], MCGRORY ([id number]) Visit Report for 05/24/2015 Arrival Information Details Patient Name: MARICO, BUCKLE Date of Service: 05/24/2015 1:00 PM Medical Record Number: [id number] Patient Account Number: [id number] Date of Birth/Sex: 1941/02/25 (73 y.o. Male) Treating RN: Junious Dresser Primary Care Physician: Apolonio Schneiders Other Clinician: Referring Physician: Apolonio Schneiders Treating Physician/Extender: Frann Rider in Treatment: 8 Visit Information History Since Last Visit Added or deleted any medications: No Patient Arrived: Ambulatory Any new allergies or adverse reactions: No Arrival Time: 13:06 Had a fall or experienced change in No Accompanied By: self activities of daily living that may affect Transfer Assistance: None risk of falls: Patient Identification Verified: Yes Signs or symptoms of abuse/neglect since last No Secondary Verification Process Yes visito Completed: Hospitalized since last visit: No Patient Has Alerts: Yes Has Dressing in Place as Prescribed: Yes Patient Alerts: Type II Diabetic Pain Present Now: No AIC 6.2 ABI L 1.20 R 1.12 Electronic Signature(s) Signed: 05/24/2015 5:11:45 PM By: Junious Dresser RN Entered By: Junious Dresser on 05/24/2015 13:08:20 [name] ([id number]) -------------------------------------------------------------------------------- Clinic Level of Care Assessment Details Patient Name: [name] Date of Service: 05/24/2015 1:00 PM Medical Record Number: [id number] Patient Account Number: [id number] Date of Birth/Sex: 06-11-1941 (74 y.o. Male) Treating RN: Montey Hora Primary Care Physician: Apolonio Schneiders Other Clinician: Referring Physician: Apolonio Schneiders Treating Physician/Extender: Frann Rider in Treatment: 8 Clinic Level of Care Assessment Items TOOL 4 Quantity Score []  - Use when only an EandM is performed on FOLLOW-UP visit 0 ASSESSMENTS - Nursing Assessment / Reassessment X - Reassessment of  Co-morbidities (includes updates in patient status) 1 10 X - Reassessment of Adherence to Treatment Plan 1 5 ASSESSMENTS - Wound and Skin Assessment / Reassessment X - Simple Wound Assessment / Reassessment - one wound 1 5 []  - Complex Wound Assessment / Reassessment - multiple wounds 0 []  - Dermatologic / Skin Assessment (not related to wound area) 0 ASSESSMENTS - Focused Assessment X - Circumferential Edema Measurements - multi extremities 1 5 []  - Nutritional Assessment / Counseling / Intervention 0 X - Lower Extremity Assessment (monofilament, tuning fork, pulses) 1 5 []  - Peripheral Arterial Disease Assessment (using hand held doppler) 0 ASSESSMENTS - Ostomy and/or Continence Assessment and Care []  - Incontinence Assessment and Management 0 []  - Ostomy Care Assessment and Management (repouching, etc.) 0 PROCESS - Coordination of Care X - Simple Patient / Family Education for ongoing care 1 15 []  - Complex (extensive) Patient / Family Education for ongoing care 0 []  - Staff obtains Programmer, systems, Records, Test Results / Process Orders 0 []  - Staff telephones HHA, Nursing Homes / Clarify orders / etc 0 []  - Routine Transfer to another Facility (non-emergent condition) 0 ARATH, KAIGLER. ([id number]) []  - Routine Hospital Admission (non-emergent condition) 0 []  - New Admissions / Biomedical engineer / Ordering NPWT, Apligraf, etc. 0 []  - Emergency Hospital Admission (emergent condition) 0 X - Simple Discharge Coordination 1 10 []  - Complex (extensive) Discharge Coordination 0 PROCESS - Special Needs []  - Pediatric / Minor Patient Management 0 []  - Isolation Patient Management 0 []  - Hearing / Language / Visual special needs 0 []  - Assessment of Community assistance (transportation, D/C planning, etc.) 0 []  - Additional assistance / Altered mentation 0 []  - Support Surface(s) Assessment (bed, cushion, seat, etc.) 0 INTERVENTIONS - Wound Cleansing / Measurement X - Simple Wound  Cleansing - one wound 1 5 []  - Complex Wound Cleansing - multiple wounds 0 X - Wound Imaging (photographs -  any number of wounds) 1 5 []  - Wound Tracing (instead of photographs) 0 X - Simple Wound Measurement - one wound 1 5 []  - Complex Wound Measurement - multiple wounds 0 INTERVENTIONS - Wound Dressings X - Small Wound Dressing one or multiple wounds 1 10 []  - Medium Wound Dressing one or multiple wounds 0 []  - Large Wound Dressing one or multiple wounds 0 []  - Application of Medications - topical 0 []  - Application of Medications - injection 0 INTERVENTIONS - Miscellaneous []  - External ear exam 0 TIM, CORRIHER. ([id number]) []  - Specimen Collection (cultures, biopsies, blood, body fluids, etc.) 0 []  - Specimen(s) / Culture(s) sent or taken to Lab for analysis 0 []  - Patient Transfer (multiple staff / Harrel Lemon Lift / Similar devices) 0 []  - Simple Staple / Suture removal (25 or less) 0 []  - Complex Staple / Suture removal (26 or more) 0 []  - Hypo / Hyperglycemic Management (close monitor of Blood Glucose) 0 []  - Ankle / Brachial Index (ABI) - do not check if billed separately 0 X - Vital Signs 1 5 Has the patient been seen at the hospital within the last three years: Yes Total Score: 85 Level Of Care: New/Established - Level 3 Electronic Signature(s) Signed: 05/24/2015 5:12:42 PM By: Montey Hora Entered By: Montey Hora on 05/24/2015 13:21:06 [name] ([id number]) -------------------------------------------------------------------------------- Encounter Discharge Information Details Patient Name: [name] Date of Service: 05/24/2015 1:00 PM Medical Record Number: [id number] Patient Account Number: [id number] Date of Birth/Sex: 15-Sep-1941 (74 y.o. Male) Treating RN: Primary Care Physician: Apolonio Schneiders Other Clinician: Referring Physician: Apolonio Schneiders Treating Physician/Extender: Frann Rider in Treatment: 8 Encounter Discharge Information  Items Discharge Condition: Stable Ambulatory Status: Ambulatory Discharge Destination: Home Transportation: Private Auto Accompanied By: self Schedule Follow-up Appointment: Yes Medication Reconciliation completed and provided to Patient/Care No Trevyn Lumpkin: Provided on Clinical Summary of Care: 05/24/2015 Form Type Recipient Paper Patient RO Electronic Signature(s) Signed: 05/24/2015 5:11:45 PM By: Junious Dresser RN Previous Signature: 05/24/2015 1:24:03 PM Version By: Ruthine Dose Entered By: Junious Dresser on 05/24/2015 16:58:32 [name] ([id number]) -------------------------------------------------------------------------------- Lower Extremity Assessment Details Patient Name: [name] Date of Service: 05/24/2015 1:00 PM Medical Record Number: [id number] Patient Account Number: [id number] Date of Birth/Sex: 07-Jul-1941 (74 y.o. Male) Treating RN: Junious Dresser Primary Care Physician: Apolonio Schneiders Other Clinician: Referring Physician: Apolonio Schneiders Treating Physician/Extender: Frann Rider in Treatment: 8 Vascular Assessment Claudication: Claudication Assessment [Left:None] Pulses: Posterior Tibial Palpable: [Left:Yes] Extremity colors, hair growth, and conditions: Extremity Color: [Left:Normal] Hair Growth on Extremity: [Left:Yes] Temperature of Extremity: [Left:Warm] Capillary Refill: [Left:< 3 seconds] Dependent Rubor: [Left:No] Blanched when Elevated: [Left:No] Toe Nail Assessment Left: Right: Thick: No Discolored: No Deformed: No Improper Length and Hygiene: No Electronic Signature(s) Signed: 05/24/2015 5:11:45 PM By: Junious Dresser RN Entered By: Junious Dresser on 05/24/2015 13:12:26 [name] ([id number]) -------------------------------------------------------------------------------- Multi Wound Chart Details Patient Name: [name] Date of Service: 05/24/2015 1:00 PM Medical Record Number: [id number] Patient Account Number:  [id number] Date of Birth/Sex: 07/07/1941 (74 y.o. Male) Treating RN: Montey Hora Primary Care Physician: Apolonio Schneiders Other Clinician: Referring Physician: Apolonio Schneiders Treating Physician/Extender: Frann Rider in Treatment: 8 Vital Signs Height(in): 71 Pulse(bpm): 57 Weight(lbs): 180 Blood Pressure 117/51 (mmHg): Body Mass Index(BMI): 25 Temperature(F): 97.8 Respiratory Rate 16 (breaths/min): Photos: [1:No Photos] [N/A:N/A] Wound Location: [1:Left Calcaneous - Lateral] [N/A:N/A] Wounding Event: [1:Gradually Appeared] [N/A:N/A] Primary Etiology: [1:Diabetic Wound/Ulcer of the Lower Extremity] [N/A:N/A] Comorbid History: [1:Type II Diabetes] [  N/A:N/A] Date Acquired: [1:02/12/2015] [N/A:N/A] Weeks of Treatment: [1:8] [N/A:N/A] Wound Status: [1:Open] [N/A:N/A] Measurements L x W x D 1x0.5x0.1 [N/A:N/A] (cm) Area (cm) : [1:0.393] [N/A:N/A] Volume (cm) : [1:0.039] [N/A:N/A] % Reduction in Area: [1:55.30%] [N/A:N/A] % Reduction in Volume: 55.70% [N/A:N/A] Classification: [1:Grade 1] [N/A:N/A] Exudate Amount: [1:Small] [N/A:N/A] Exudate Type: [1:Serous] [N/A:N/A] Exudate Color: [1:amber] [N/A:N/A] Wound Margin: [1:Flat and Intact] [N/A:N/A] Granulation Amount: [1:Small (1-33%)] [N/A:N/A] Granulation Quality: [1:Red, Pink] [N/A:N/A] Necrotic Amount: [1:None Present (0%)] [N/A:N/A] Exposed Structures: [1:Fascia: No Fat: No Tendon: No Muscle: No Joint: No Bone: No] [N/A:N/A] Limited to Skin Breakdown Epithelialization: Large (67-100%) N/A N/A Periwound Skin Texture: Callus: Yes N/A N/A Edema: No Excoriation: No Induration: No Crepitus: No Fluctuance: No Friable: No Rash: No Scarring: No Periwound Skin Moist: Yes N/A N/A Moisture: Maceration: No Dry/Scaly: No Periwound Skin Color: Atrophie Blanche: No N/A N/A Cyanosis: No Ecchymosis: No Erythema: No Hemosiderin Staining: No Mottled: No Pallor: No Rubor: No Temperature: No Abnormality N/A  N/A Tenderness on No N/A N/A Palpation: Wound Preparation: Ulcer Cleansing: N/A N/A Rinsed/Irrigated with Saline Topical Anesthetic Applied: Other: lidocaine 4% Treatment Notes Electronic Signature(s) Signed: 05/24/2015 5:12:42 PM By: Montey Hora Entered By: Montey Hora on 05/24/2015 13:16:57 [name] ([id number]) -------------------------------------------------------------------------------- [place] Details Patient Name: [name] Date of Service: 05/24/2015 1:00 PM Medical Record Number: [id number] Patient Account Number: [id number] Date of Birth/Sex: 08/15/1941 (74 y.o. Male) Treating RN: Montey Hora Primary Care Physician: Apolonio Schneiders Other Clinician: Referring Physician: Apolonio Schneiders Treating Physician/Extender: Frann Rider in Treatment: 8 Active Inactive Orientation to the Wound Care Program Nursing Diagnoses: Knowledge deficit related to the wound healing center program Goals: Patient/caregiver will verbalize understanding of the [facility] Program Date Initiated: 03/23/2015 Goal Status: Active Interventions: Provide education on orientation to the wound center Notes: Wound/Skin Impairment Nursing Diagnoses: Knowledge deficit related to ulceration/compromised skin integrity Goals: Patient/caregiver will verbalize understanding of skin care regimen Date Initiated: 03/23/2015 Goal Status: Active Ulcer/skin breakdown will heal within 14 weeks Date Initiated: 03/23/2015 Goal Status: Active Interventions: Assess patient/caregiver ability to obtain necessary supplies Assess patient/caregiver ability to perform ulcer/skin care regimen upon admission and as needed Assess ulceration(s) every visit Provide education on ulcer and skin care Treatment Activities: Skin care regimen initiated : 05/24/2015 Topical wound management initiated : 05/24/2015 KEIYON, PLACK ([id number]) Notes: Electronic  Signature(s) Signed: 05/24/2015 5:12:42 PM By: Montey Hora Entered By: Montey Hora on 05/24/2015 13:16:48 [name] ([id number]) -------------------------------------------------------------------------------- Pain Assessment Details Patient Name: [name] Date of Service: 05/24/2015 1:00 PM Medical Record Number: [id number] Patient Account Number: [id number] Date of Birth/Sex: Apr 10, 1941 (73 y.o. Male) Treating RN: Junious Dresser Primary Care Physician: Apolonio Schneiders Other Clinician: Referring Physician: Apolonio Schneiders Treating Physician/Extender: Frann Rider in Treatment: 8 Active Problems Location of Pain Severity and Description of Pain Patient Has Paino No Site Locations Pain Management and Medication Current Pain Management: Electronic Signature(s) Signed: 05/24/2015 5:11:45 PM By: Junious Dresser RN Entered By: Junious Dresser on 05/24/2015 13:08:43 [name] ([id number]) -------------------------------------------------------------------------------- Patient/Caregiver Education Details Patient Name: [name] Date of Service: 05/24/2015 1:00 PM Medical Record Number: [id number] Patient Account Number: [id number] Date of Birth/Gender: 05-04-1941 (74 y.o. Male) Treating RN: Montey Hora Primary Care Physician: Apolonio Schneiders Other Clinician: Referring Physician: Apolonio Schneiders Treating Physician/Extender: Frann Rider in Treatment: 8 Education Assessment Education Provided To: Patient Education Topics Provided Wound/Skin Impairment: Handouts: Other: wound care to continue while on vacation Methods: Demonstration, Explain/Verbal Responses: State content  correctly Electronic Signature(s) Signed: 05/24/2015 5:12:42 PM By: Montey Hora Entered By: Montey Hora on 05/24/2015 13:21:40 [name] ([id number]) -------------------------------------------------------------------------------- Wound Assessment Details Patient Name:  [name] Date of Service: 05/24/2015 1:00 PM Medical Record Number: [id number] Patient Account Number: [id number] Date of Birth/Sex: Dec 11, 1941 (74 y.o. Male) Treating RN: Junious Dresser Primary Care Physician: Apolonio Schneiders Other Clinician: Referring Physician: Apolonio Schneiders Treating Physician/Extender: Frann Rider in Treatment: 8 Wound Status Wound Number: 1 Primary Diabetic Wound/Ulcer of the Lower Etiology: Extremity Wound Location: Left Calcaneous - Lateral Wound Status: Open Wounding Event: Gradually Appeared Comorbid Type II Diabetes Date Acquired: 02/12/2015 History: Weeks Of Treatment: 8 Clustered Wound: No Photos Photo Uploaded By: Junious Dresser on 05/24/2015 16:40:10 Wound Measurements Length: (cm) 1 Width: (cm) 0.5 Depth: (cm) 0.1 Area: (cm) 0.393 Volume: (cm) 0.039 % Reduction in Area: 55.3% % Reduction in Volume: 55.7% Epithelialization: Large (67-100%) Tunneling: No Undermining: No Wound Description Classification: Grade 1 Wound Margin: Flat and Intact Exudate Amount: Small Exudate Type: Serous Exudate Color: amber AVYAN, LIVESAY. ([id number]) Foul Odor After Cleansing: No Wound Bed Granulation Amount: Small (1-33%) Exposed Structure Granulation Quality: Red, Pink Fascia Exposed: No Necrotic Amount: None Present (0%) Fat Layer Exposed: No Tendon Exposed: No Muscle Exposed: No Joint Exposed: No Bone Exposed: No Limited to Skin Breakdown Periwound Skin Texture Texture Color No Abnormalities Noted: No No Abnormalities Noted: No Callus: Yes Atrophie Blanche: No Crepitus: No Cyanosis: No Excoriation: No Ecchymosis: No Fluctuance: No Erythema: No Friable: No Hemosiderin Staining: No Induration: No Mottled: No Localized Edema: No Pallor: No Rash: No Rubor: No Scarring: No Temperature / Pain Moisture Temperature: No Abnormality No Abnormalities Noted: No Dry / Scaly: No Maceration: No Moist: Yes Wound Preparation Ulcer  Cleansing: Rinsed/Irrigated with Saline Topical Anesthetic Applied: Other: lidocaine 4%, Treatment Notes Wound #1 (Left, Lateral Calcaneous) 1. Cleansed with: Clean wound with Normal Saline 2. Anesthetic Topical Lidocaine 4% cream to wound bed prior to debridement 4. Dressing Applied: Prisma Ag 5. Secondary Dressing Applied Bordered Foam Dressing Notes darco shoe with peg assist DERRIK, MCEACHERN ([id number]) Electronic Signature(s) Signed: 05/24/2015 5:11:45 PM By: Junious Dresser RN Entered By: Junious Dresser on 05/24/2015 13:13:00 [name] ([id number]) -------------------------------------------------------------------------------- Vitals Details Patient Name: [name] Date of Service: 05/24/2015 1:00 PM Medical Record Number: [id number] Patient Account Number: [id number] Date of Birth/Sex: 03-04-1941 (74 y.o. Male) Treating RN: Junious Dresser Primary Care Physician: Apolonio Schneiders Other Clinician: Referring Physician: Apolonio Schneiders Treating Physician/Extender: Frann Rider in Treatment: 8 Vital Signs Time Taken: 13:05 Temperature (F): 97.8 Height (in): 71 Pulse (bpm): 57 Weight (lbs): 180 Respiratory Rate (breaths/min): 16 Body Mass Index (BMI): 25.1 Blood Pressure (mmHg): 117/51 Reference Range: 80 - 120 mg / dl Electronic Signature(s) Signed: 05/24/2015 5:11:45 PM By: Junious Dresser RN Entered By: Junious Dresser on 05/24/2015 13:09:08

## 2015-05-24 NOTE — Progress Notes (Signed)
DILYN, SMILES ([id number]) Visit Report for 05/24/2015 Chief Complaint Document Details Patient Name: [name], [name] Date of Service: 05/24/2015 1:00 PM Medical Record Number: [id number] Patient Account Number: [id number] Date of Birth/Sex: 1941-04-21 (74 y.o. Male) Treating RN: Primary Care Physician: Apolonio Schneiders Other Clinician: Referring Physician: Apolonio Schneiders Treating Physician/Extender: Frann Rider in Treatment: 8 Information Obtained from: Patient Chief Complaint Patient presents to the wound care center for a consult due non healing wound pleasant 74 year old gentleman who comes with a ulcerated area on the left lateral heel and he's had it for about a month. Electronic Signature(s) Signed: 05/24/2015 4:38:19 PM By: Christin Fudge MD, FACS Entered By: Christin Fudge on 05/24/2015 13:27:46 [name] [name] ([id number]) -------------------------------------------------------------------------------- HPI Details Patient Name: [name] [name] Date of Service: 05/24/2015 1:00 PM Medical Record Number: [id number] Patient Account Number: [id number] Date of Birth/Sex: March 03, 1941 (74 y.o. Male) Treating RN: Primary Care Physician: Apolonio Schneiders Other Clinician: Referring Physician: Apolonio Schneiders Treating Physician/Extender: Frann Rider in Treatment: 8 History of Present Illness HPI Description: 74 year old gentleman who comes as a self-referral and has been known to be a diabetic on treatment for the last 30 years and takes insulin for this. Developed a callus on the left lateral heel and does not know exactly what type of footwear cause this. Did see his dermatologist but prescribed some medication for him and also applied some Bactroban cream. Able to soften this area and remove some of the dead skin but he now has a definite ulcer there and knowing he's been a diabetic he came as a self-referral for further treatment. He's been a nonsmoker all his life and hasn't had  no problems with any arterial disease and has no workup done for this. 04/06/2015 -- he has been doing his dressing on alternate days but sometimes he runs that the dressing doesn't stay on for long. He also informs me that he is going to be out on vacation this weekend and will be back only in 2 weeks. 04/27/2015 -- his blood sugars under good control and he says he's been offloading as much as possible and has no pain whatsoever. 05/04/2015 -- he has been doing very well his blood sugars are well under control and his offloading and changing of dressing has been very compliant. 05/18/2015 - Due to some family issues he's not been seen back for 2 weeks. he ran out of his Prisma dressing and went and bought a duodenum type of dressing form the pharmacy and has used it for the last 48 hours. He started getting a minimal swelling in his leg and had noticed a change in his wound. Electronic Signature(s) Signed: 05/24/2015 4:38:19 PM By: Christin Fudge MD, FACS Entered By: Christin Fudge on 05/24/2015 13:27:56 [name] [name] ([id number]) -------------------------------------------------------------------------------- Physical Exam Details Patient Name: [name] [name] Date of Service: 05/24/2015 1:00 PM Medical Record Number: [id number] Patient Account Number: [id number] Date of Birth/Sex: 04-10-1941 (74 y.o. Male) Treating RN: Primary Care Physician: Apolonio Schneiders Other Clinician: Referring Physician: Apolonio Schneiders Treating Physician/Extender: Frann Rider in Treatment: 8 Constitutional . Pulse regular. Respirations normal and unlabored. Afebrile. . Eyes Nonicteric. Reactive to light. Ears, Nose, Mouth, and Throat Lips, teeth, and gums WNL.Marland Kitchen Moist mucosa without lesions . Neck supple and nontender. No palpable supraclavicular or cervical adenopathy. Normal sized without goiter. Respiratory WNL. No retractions.. Cardiovascular Pedal Pulses WNL. No clubbing, cyanosis or  edema. Musculoskeletal Adexa without tenderness or enlargement.. Digits and nails w/o clubbing, cyanosis, infection, petechiae, ischemia,  or inflammatory conditions.. Integumentary (Hair, Skin) the left lateral foot is looking very good and the wound has healed remarkably well with just 2 small open areas.. No crepitus or fluctuance. No peri-wound warmth or erythema. No masses.Marland Kitchen Psychiatric Judgement and insight Intact.. No evidence of depression, anxiety, or agitation.. Electronic Signature(s) Signed: 05/24/2015 4:38:19 PM By: Christin Fudge MD, FACS Entered By: Christin Fudge on 05/24/2015 13:32:32 [name] [name] ([id number]) -------------------------------------------------------------------------------- Physician Orders Details Patient Name: [name] [name] Date of Service: 05/24/2015 1:00 PM Medical Record Number: [id number] Patient Account Number: [id number] Date of Birth/Sex: 1941-08-24 (74 y.o. Male) Treating RN: Montey Hora Primary Care Physician: Apolonio Schneiders Other Clinician: Referring Physician: Apolonio Schneiders Treating Physician/Extender: Frann Rider in Treatment: 8 Verbal / Phone Orders: Yes Clinician: Montey Hora Read Back and Verified: Yes Diagnosis Coding Wound Cleansing Wound #1 Left,Lateral Calcaneous o Clean wound with Normal Saline. Anesthetic Wound #1 Left,Lateral Calcaneous o Topical Lidocaine 4% cream applied to wound bed prior to debridement Primary Wound Dressing Wound #1 Left,Lateral Calcaneous o Prisma Ag Secondary Dressing Wound #1 Left,Lateral Calcaneous o Boardered Foam Dressing Dressing Change Frequency Wound #1 Left,Lateral Calcaneous o Change dressing every day. Follow-up Appointments Wound #1 Left,Lateral Calcaneous o Return Appointment in 1 week. Off-Loading Wound #1 Left,Lateral Calcaneous o Open toe surgical shoe with peg assist. Electronic Signature(s) Signed: 05/24/2015 4:38:19 PM By: Christin Fudge MD,  FACS Signed: 05/24/2015 5:12:42 PM By: Montey Hora Entered By: Montey Hora on 05/24/2015 13:17:55 [name] [name] ([id number]) Faith Rogue, Mariana Kaufman ([id number]) -------------------------------------------------------------------------------- Problem List Details Patient Name: [name] [name] Date of Service: 05/24/2015 1:00 PM Medical Record Number: [id number] Patient Account Number: [id number] Date of Birth/Sex: [birth year] (74 y.o. 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 Male) Treating RN: Primary Care Physician: Apolonio Schneiders Other Clinician: Referring Physician: Apolonio Schneiders Treating Physician/Extender: Frann Rider in Treatment: 8 Subjective Chief Complaint Information obtained from Patient Patient presents to the wound care center for a consult due non healing wound pleasant 74 year old gentleman who comes with a ulcerated area on the left lateral heel and he's had it for about a month. History of Present Illness (HPI) 73 year old gentleman who comes as a self-referral and has been known to be a diabetic on treatment for the last 30 years  and takes insulin for this. Developed a callus on the left lateral heel and does not know exactly what type of footwear cause this. Did see his dermatologist but prescribed some medication for him and also applied some Bactroban cream. Able to soften this area and remove some of the dead skin but he now has a definite ulcer there and knowing he's been a diabetic he came as a self-referral for further treatment. He's been a nonsmoker all his life and hasn't had no problems with any arterial disease and has no workup done for this. 04/06/2015 -- he has been doing his dressing on  alternate days but sometimes he runs that the dressing doesn't stay on for long. He also informs me that he is going to be out on vacation this weekend and will be back only in 2 weeks. 04/27/2015 -- his blood sugars under good control and he says he's been offloading as much as possible and has no pain whatsoever. 05/04/2015 -- he has been doing very well his blood sugars are well under control and his offloading and changing of dressing has been very compliant. 05/18/2015 - Due to some family issues he's not been seen back for 2 weeks. he ran out of his Prisma dressing and went and bought a duodenum type of dressing form the pharmacy and has used it for the last 48 hours. He started getting a minimal swelling in his leg and had noticed a change in his wound. Objective Constitutional Pulse regular. Respirations normal and unlabored. Afebrile. [name], [name] ([id number]) Vitals Time Taken: 1:05 PM, Height: 71 in, Weight: 180 lbs, BMI: 25.1, Temperature: 97.8 F, Pulse: 57 bpm, Respiratory Rate: 16 breaths/min, Blood Pressure: 117/51 mmHg. Eyes Nonicteric. Reactive to light. Ears, Nose, Mouth, and Throat Lips, teeth, and gums WNL.Marland Kitchen Moist mucosa without lesions . Neck supple and nontender. No palpable supraclavicular or cervical adenopathy. Normal sized without goiter. Respiratory WNL. No  retractions.. Cardiovascular Pedal Pulses WNL. No clubbing, cyanosis or edema. Musculoskeletal Adexa without tenderness or enlargement.. Digits and nails w/o clubbing, cyanosis, infection, petechiae, ischemia, or inflammatory conditions.Marland Kitchen Psychiatric Judgement and insight Intact.. No evidence of depression, anxiety, or agitation.. Integumentary (Hair, Skin) the left lateral foot is looking very good and the wound has healed remarkably well with just 2 small open areas.. No crepitus or fluctuance. No peri-wound warmth or erythema. No masses.. Wound #1 status is Open. Original cause of wound was Gradually Appeared. The wound is located on the Left,Lateral Calcaneous. The wound measures 1cm length x 0.5cm width x 0.1cm depth; 0.393cm^2 area and 0.039cm^3 volume. The wound is limited to skin breakdown. There is no tunneling or undermining noted. There is a small amount of serous drainage noted. The wound margin is flat and intact. There is small (1-33%) red, pink granulation within the wound bed. There is no necrotic tissue within the wound bed. The periwound skin appearance exhibited: Callus, Moist. The periwound skin appearance did not exhibit: Crepitus, Excoriation, Fluctuance, Friable, Induration, Localized Edema, Rash, Scarring, Dry/Scaly, Maceration, Atrophie Blanche, Cyanosis, Ecchymosis, Hemosiderin Staining, Mottled, Pallor, Rubor, Erythema. Periwound temperature was noted as No Abnormality. 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 He is going to be away next Monday but will call as if there are any changes and come on Friday if need be. I have also asked him to start his paperwork with  his PCP regarding his [employer] orthotic shoes for diabetes. Plan Wound Cleansing: Wound #1 Left,Lateral Calcaneous: Clean wound with Normal Saline. Anesthetic: Wound #1 Left,Lateral Calcaneous: Topical Lidocaine 4% cream applied to wound bed prior to debridement Primary Wound Dressing: Wound #1 Left,Lateral Calcaneous: Prisma Ag Secondary Dressing: Wound #1 Left,Lateral Calcaneous: Boardered Foam Dressing Dressing Change Frequency: Wound #1 Left,Lateral Calcaneous: Change dressing every day. Follow-up Appointments: Wound #1 Left,Lateral Calcaneous: Return Appointment in 1 week. Off-Loading: Wound #1 Left,Lateral  Calcaneous: Open toe surgical shoe with peg assist. He has made an excellent recovery over the last week and has been compliant with using his Peg assist shoe and the dressing as prescribed. [name], [name] ([id number]) He is going to be away next Monday but will call as if there are any changes and come on Friday if need be. Electronic Signature(s) Signed: 05/24/2015 4:38:19 PM By: Christin Fudge MD, FACS Entered By: Christin Fudge on 05/24/2015 13:34:20 [name] [name] ([id number]) -------------------------------------------------------------------------------- SuperBill Details Patient Name: [name] [name] Date of Service: 05/24/2015 Medical Record Number: [id number] Patient Account Number: [id number] Date of Birth/Sex: [birth year] (74 y.o. 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

## 2015-06-08 ENCOUNTER — Encounter: Payer: Medicare Other | Admitting: Surgery

## 2015-06-08 DIAGNOSIS — L97421 Non-pressure chronic ulcer of left heel and midfoot limited to breakdown of skin: Secondary | ICD-10-CM | POA: Diagnosis not present

## 2015-06-09 NOTE — Progress Notes (Signed)
CORBITT, CLOKE ([id number]) Visit Report for 06/08/2015 Arrival Information Details Patient Name: [name] [name], [name] [name] Date of Service: 06/08/2015 8:15 AM Medical Record Number: [id number] Patient Account Number: [id number] Date of Birth/Sex: July 31, 1941 (74 y.o. Male) Treating RN: Montey Hora Primary Care Physician: Apolonio Schneiders Other Clinician: Referring Physician: Apolonio Schneiders Treating Physician/Extender: Frann Rider in Treatment: 11 Visit Information History Since Last Visit Added or deleted any medications: No Patient Arrived: Ambulatory Any new allergies or adverse reactions: No Arrival Time: 08:20 Had a fall or experienced change in No Accompanied By: self activities of daily living that may affect Transfer Assistance: None risk of falls: Patient Identification Verified: Yes Signs or symptoms of abuse/neglect since last No Secondary Verification Process Yes visito Completed: Hospitalized since last visit: No Patient Has Alerts: Yes Pain Present Now: No Patient Alerts: Type II Diabetic AIC 6.2 ABI L 1.20 R 1.12 Electronic Signature(s) Signed: 06/08/2015 4:37:27 PM By: Montey Hora Entered By: Montey Hora on 06/08/2015 08:21:52 [name] [name] ([id number]) -------------------------------------------------------------------------------- Encounter Discharge Information Details Patient Name: [name] [name] Date of Service: 06/08/2015 8:15 AM Medical Record Number: [id number] Patient Account Number: [id number] Date of Birth/Sex: 1940-12-18 (74 y.o. Male) Treating RN: Montey Hora Primary Care Physician: Apolonio Schneiders Other Clinician: Referring Physician: Apolonio Schneiders Treating Physician/Extender: Frann Rider in Treatment: 11 Encounter Discharge Information Items Discharge Pain Level: 0 Discharge Condition: Stable Ambulatory Status: Ambulatory Discharge Destination: Home Transportation: Private Auto Accompanied By: self Schedule Follow-up  Appointment: Yes Medication Reconciliation completed and provided to Patient/Care No [name] [name]: Provided on Clinical Summary of Care: 06/08/2015 Form Type Recipient Paper Patient RO Electronic Signature(s) Signed: 06/08/2015 9:12:17 AM By: Ruthine Dose Entered By: Ruthine Dose on 06/08/2015 09:12:17 [name] [name] ([id number]) -------------------------------------------------------------------------------- Lower Extremity Assessment Details Patient Name: [name] [name] Date of Service: 06/08/2015 8:15 AM Medical Record Number: [id number] Patient Account Number: [id number] Date of Birth/Sex: Nov 23, 1941 (74 y.o. Male) Treating RN: Montey Hora Primary Care Physician: Apolonio Schneiders Other Clinician: Referring Physician: Apolonio Schneiders Treating Physician/Extender: Frann Rider in Treatment: 11 Vascular Assessment Pulses: Posterior Tibial Palpable: [Left:Yes] Dorsalis Pedis Palpable: [Left:Yes] Extremity colors, hair growth, and conditions: Extremity Color: [Left:Hyperpigmented] Hair Growth on Extremity: [Left:Yes] Temperature of Extremity: [Left:Warm] Capillary Refill: [Left:< 3 seconds] Electronic Signature(s) Signed: 06/08/2015 4:37:27 PM By: Montey Hora Entered By: Montey Hora on 06/08/2015 08:24:38 [name] [name] ([id number]) -------------------------------------------------------------------------------- Multi Wound Chart Details Patient Name: [name] [name] Date of Service: 06/08/2015 8:15 AM Medical Record Number: [id number] Patient Account Number: [id number] Date of Birth/Sex: 05/04/1941 (74 y.o. Male) Treating RN: Montey Hora Primary Care Physician: Apolonio Schneiders Other Clinician: Referring Physician: Apolonio Schneiders Treating Physician/Extender: Frann Rider in Treatment: 11 Vital Signs Height(in): 71 Pulse(bpm): 57 Weight(lbs): 180 Blood Pressure 119/71 (mmHg): Body Mass Index(BMI): 25 Temperature(F): 97.6 Respiratory  Rate 16 (breaths/min): Photos: [1:No Photos] [N/A:N/A] Wound Location: [1:Left Calcaneous - Lateral] [N/A:N/A] Wounding Event: [1:Gradually Appeared] [N/A:N/A] Primary Etiology: [1:Diabetic Wound/Ulcer of the Lower Extremity] [N/A:N/A] Comorbid History: [1:Type II Diabetes] [N/A:N/A] Date Acquired: [1:02/12/2015] [N/A:N/A] Weeks of Treatment: [1:11] [N/A:N/A] Wound Status: [1:Open] [N/A:N/A] Measurements L x W x D 0.9x0.9x0.1 [N/A:N/A] (cm) Area (cm) : [1:0.636] [N/A:N/A] Volume (cm) : [1:0.064] [N/A:N/A] % Reduction in Area: [1:27.70%] [N/A:N/A] % Reduction in Volume: 27.30% [N/A:N/A] Classification: [1:Grade 1] [N/A:N/A] Exudate Amount: [1:Small] [N/A:N/A] Exudate Type: [1:Sanguinous] [N/A:N/A] Exudate Color: [1:red] [N/A:N/A] Wound Margin: [1:Flat and Intact] [N/A:N/A] Granulation Amount: [1:Large (67-100%)] [N/A:N/A] Granulation Quality: [1:Red, Pink] [N/A:N/A] Necrotic Amount: [1:None Present (0%)] [N/A:N/A] Exposed Structures: [1:Fascia: No Fat: No  Tendon: No Muscle: No Joint: No Bone: No] [N/A:N/A] Limited to Skin Breakdown Epithelialization: Large (67-100%) N/A N/A Periwound Skin Texture: Callus: Yes N/A N/A Edema: No Excoriation: No Induration: No Crepitus: No Fluctuance: No Friable: No Rash: No Scarring: No Periwound Skin Moist: Yes N/A N/A Moisture: Maceration: No Dry/Scaly: No Periwound Skin Color: Atrophie Blanche: No N/A N/A Cyanosis: No Ecchymosis: No Erythema: No Hemosiderin Staining: No Mottled: No Pallor: No Rubor: No Temperature: No Abnormality N/A N/A Tenderness on No N/A N/A Palpation: Wound Preparation: Ulcer Cleansing: N/A N/A Rinsed/Irrigated with Saline Topical Anesthetic Applied: Other: lidocaine 4% Treatment Notes Electronic Signature(s) Signed: 06/08/2015 4:37:27 PM By: Montey Hora Entered By: Montey Hora on 06/08/2015 08:33:50 [name] [name]  ([id number]) -------------------------------------------------------------------------------- [place] Details Patient Name: [name] [name] Date of Service: 06/08/2015 8:15 AM Medical Record Number: [id number] Patient Account Number: [id number] Date of Birth/Sex: [birth year] (74 y.o. Male) Treating RN: Montey Hora Primary Care Physician: Apolonio Schneiders Other Clinician: Referring Physician: Apolonio Schneiders Treating Physician/Extender: Frann Rider in Treatment: 11 Active Inactive Orientation to the Wound Care Program Nursing Diagnoses: Knowledge deficit related to the wound healing center program Goals: Patient/caregiver will verbalize understanding of the [facility] Date Initiated: 03/23/2015 Goal Status: Active Interventions: Provide education on orientation to the wound center Notes: Wound/Skin Impairment Nursing Diagnoses: Knowledge deficit related to ulceration/compromised skin integrity Goals: Patient/caregiver will verbalize understanding of skin care regimen Date Initiated: 03/23/2015 Goal Status: Active Ulcer/skin breakdown will heal within 14 weeks Date Initiated: 03/23/2015 Goal Status: Active Interventions: Assess patient/caregiver ability to obtain necessary supplies Assess patient/caregiver ability to perform ulcer/skin care regimen upon admission and as needed Assess ulceration(s) every visit Provide education on ulcer and skin care Treatment Activities: Skin care regimen initiated : 06/08/2015 Topical wound management initiated : 06/08/2015 DICKY, BOER ([id number]) Notes: Electronic Signature(s) Signed: 06/08/2015 4:37:27 PM By: Montey Hora Entered By: Montey Hora on 06/08/2015 08:33:39 [name] [name] ([id number]) -------------------------------------------------------------------------------- Patient/Caregiver Education Details Patient Name: [name] [name] Date of Service: 06/08/2015 8:15 AM Medical Record  Number: [id number] Patient Account Number: [id number] Date of Birth/Gender: 04-15-1941 (74 y.o. Male) Treating RN: Montey Hora Primary Care Physician: Apolonio Schneiders Other Clinician: Referring Physician: Apolonio Schneiders Treating Physician/Extender: Frann Rider in Treatment: 11 Education Assessment Education Provided To: Patient Education Topics Provided Wound/Skin Impairment: Handouts: Other: purpose of skin substitutes Methods: Demonstration, Explain/Verbal Responses: State content correctly Electronic Signature(s) Signed: 06/08/2015 4:37:27 PM By: Montey Hora Entered By: Montey Hora on 06/08/2015 08:52:16 [name] [name] ([id number]) -------------------------------------------------------------------------------- Wound Assessment Details Patient Name: [name] [name] Date of Service: 06/08/2015 8:15 AM Medical Record Number: [id number] Patient Account Number: [id number] Date of Birth/Sex: 24-Nov-1941 (74 y.o. Male) Treating RN: Montey Hora Primary Care Physician: Apolonio Schneiders Other Clinician: Referring Physician: Apolonio Schneiders Treating Physician/Extender: Frann Rider in Treatment: 11 Wound Status Wound Number: 1 Primary Diabetic Wound/Ulcer of the Lower Etiology: Extremity Wound Location: Left Calcaneous - Lateral Wound Status: Open Wounding Event: Gradually Appeared Comorbid Type II Diabetes Date Acquired: 02/12/2015 History: Weeks Of Treatment: 11 Clustered Wound: No Photos Photo Uploaded By: Montey Hora on 06/08/2015 11:33:29 Wound Measurements Length: (cm) 0.9 Width: (cm) 0.9 Depth: (cm) 0.1 Area: (cm) 0.636 Volume: (cm) 0.064 % Reduction in Area: 27.7% % Reduction in Volume: 27.3% Epithelialization: Large (67-100%) Tunneling: No Undermining: No Wound Description Classification: Grade 1 Wound Margin: Flat and Intact Exudate Amount: Small Exudate Type: Sanguinous Exudate Color: red Foul Odor After Cleansing: No Wound  Bed Granulation Amount: Large (67-100%)  Exposed Structure Granulation Quality: Red, Pink Fascia Exposed: No Necrotic Amount: None Present (0%) Fat Layer Exposed: No Tendon Exposed: No KHRYSTIAN, SCHAUF. ([id number]) Muscle Exposed: No Joint Exposed: No Bone Exposed: No Limited to Skin Breakdown Periwound Skin Texture Texture Color No Abnormalities Noted: No No Abnormalities Noted: No Callus: Yes Atrophie Blanche: No Crepitus: No Cyanosis: No Excoriation: No Ecchymosis: No Fluctuance: No Erythema: No Friable: No Hemosiderin Staining: No Induration: No Mottled: No Localized Edema: No Pallor: No Rash: No Rubor: No Scarring: No Temperature / Pain Moisture Temperature: No Abnormality No Abnormalities Noted: No Dry / Scaly: No Maceration: No Moist: Yes Wound Preparation Ulcer Cleansing: Rinsed/Irrigated with Saline Topical Anesthetic Applied: Other: lidocaine 4%, Treatment Notes Wound #1 (Left, Lateral Calcaneous) 1. Cleansed with: Clean wound with Normal Saline 2. Anesthetic Topical Lidocaine 4% cream to wound bed prior to debridement 3. Peri-wound Care: Skin Prep 4. Dressing Applied: Aquacel Ag 5. Secondary Dressing Applied Bordered Foam Dressing Notes darco shoe with peg assist Electronic Signature(s) Signed: 06/08/2015 4:37:27 PM By: Montey Hora Entered By: Montey Hora on 06/08/2015 08:33:33 [name] [name] ([id number]) Faith Rogue, Mariana Kaufman ([id number]) -------------------------------------------------------------------------------- Vitals Details Patient Name: [name] [name] Date of Service: 06/08/2015 8:15 AM Medical Record Number: [id number] Patient Account Number: [id number] Date of Birth/Sex: 11-02-1941 (74 y.o. Male) Treating RN: Montey Hora Primary Care Physician: Apolonio Schneiders Other Clinician: Referring Physician: Apolonio Schneiders Treating Physician/Extender: Frann Rider in Treatment: 11 Vital Signs Time Taken: 08:22 Temperature (F):  97.6 Height (in): 71 Pulse (bpm): 57 Weight (lbs): 180 Respiratory Rate (breaths/min): 16 Body Mass Index (BMI): 25.1 Blood Pressure (mmHg): 119/71 Reference Range: 80 - 120 mg / dl Electronic Signature(s) Signed: 06/08/2015 4:37:27 PM By: Montey Hora Entered By: Montey Hora on 06/08/2015 08:23:53

## 2015-06-09 NOTE — Progress Notes (Signed)
[name] [name], [name] [name] ([id number]) Visit Report for 06/08/2015 Chief Complaint Document Details Patient Name: [name] [name], [name] [name] Date of Service: 06/08/2015 8:15 AM Medical Record Number: [id number] Patient Account Number: [id number] Date of Birth/Sex: [birth year] (74 y.o. Male) Treating RN: Primary Care Physician: Apolonio Schneiders Other Clinician: Referring Physician: Apolonio Schneiders Treating Physician/Extender: Frann Rider in Treatment: 11 Information Obtained from: Patient Chief Complaint Patient presents to the wound care center for a consult due non healing wound pleasant 74 year old gentleman who comes with a ulcerated area on the left lateral heel and he's had it for about a month. Electronic Signature(s) Signed: 06/08/2015 12:30:49 PM By: Christin Fudge MD, FACS Entered By: Christin Fudge on 06/08/2015 09:14:47 [name] [name] ([id number]) -------------------------------------------------------------------------------- HPI Details Patient Name: [name] [name] Date of Service: 06/08/2015 8:15 AM Medical Record Number: [id number] Patient Account Number: [id number] Date of Birth/Sex: [birth year] (74 y.o. Male) Treating RN: Primary Care Physician: Apolonio Schneiders Other Clinician: Referring Physician: Apolonio Schneiders Treating Physician/Extender: Frann Rider in Treatment: 11 History of Present Illness HPI Description: 74 year old gentleman who comes as a self-referral and has been known to be a diabetic on treatment for the last 30 years and takes insulin for this. Developed a callus on the left lateral heel and does not know exactly what type of footwear cause this. Did see his dermatologist but prescribed some medication for him and also applied some Bactroban cream. Able to soften this area and remove some of the dead skin but he now has a definite ulcer there and knowing he's been a diabetic he came as a self-referral for further treatment. He's been a nonsmoker all his life and hasn't  had no problems with any arterial disease and has no workup done for this. 04/06/2015 -- he has been doing his dressing on alternate days but sometimes he runs that the dressing doesn't stay on for long. He also informs me that he is going to be out on vacation this weekend and will be back only in 2 weeks. 04/27/2015 -- his blood sugars under good control and he says he's been offloading as much as possible and has no pain whatsoever. 05/04/2015 -- he has been doing very well his blood sugars are well under control and his offloading and changing of dressing has been very compliant. 05/18/2015 - Due to some family issues he's not been seen back for 2 weeks. he ran out of his Prisma dressing and went and bought a duodenum type of dressing form the pharmacy and has used it for the last 48 hours. He started getting a minimal swelling in his leg and had noticed a change in his wound. 06/08/2015 -- he is back after 2 weeks because of some family commitments and says has been very compliant but is frustrated that his wound has not healed yet. 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 Electronic Signature(s) Signed: 06/08/2015 12:30:49 PM By: Christin Fudge MD, FACS Entered By: Christin Fudge on 06/08/2015 09:15:52 [name] [name] ([id number]) -------------------------------------------------------------------------------- [name] [name] TISS Details Patient Name: [name] [name] Date of Service: 06/08/2015 8:15 AM Medical Record Number: [id number] Patient Account Number: [id number] Date of Birth/Sex: 1941-02-02 (74 y.o. Male) Treating RN: Primary Care Physician: Apolonio Schneiders Other Clinician: Referring Physician: Apolonio Schneiders Treating Physician/Extender: Frann Rider in Treatment: 11 Procedure Performed  for: Wound #1  Left,Lateral Calcaneous Performed By: Physician Pat Patrick., MD Notes The wound is clean and has some hyper granulation tissue and use a silver nitrate stick to cauterize this. Electronic Signature(s) Signed: 06/08/2015 12:30:49 PM By: Christin Fudge MD, FACS Entered By: Christin Fudge on 06/08/2015 09:14:41 [name] [name] ([id number]) -------------------------------------------------------------------------------- Physical Exam Details Patient Name: [name] [name] Date of Service: 06/08/2015 8:15 AM Medical Record Number: [id number] Patient Account Number: [id number] Date of Birth/Sex: September 02, 1941 (74 y.o. Male) Treating RN: Primary Care Physician: Apolonio Schneiders Other Clinician: Referring Physician: Apolonio Schneiders Treating Physician/Extender: Frann Rider in Treatment: 11 Constitutional . Pulse regular. Respirations normal and unlabored. Afebrile. . Eyes Nonicteric. Reactive to light. Ears, Nose, Mouth, and Throat Lips, teeth, and gums WNL.Marland Kitchen Moist mucosa without lesions . Neck supple and nontender. No palpable supraclavicular or cervical adenopathy. Normal sized without goiter. Respiratory WNL. No retractions.. Cardiovascular Pedal Pulses WNL. No clubbing, cyanosis or edema. Musculoskeletal Adexa without tenderness or enlargement.. Digits and nails w/o clubbing, cyanosis, infection, petechiae, ischemia, or inflammatory conditions.. Integumentary (Hair, Skin) the wound is clean and has some hyper granulation tissue and other than that this could amount of healthy skin surrounding the ulceration.. No crepitus or fluctuance. No peri-wound warmth or erythema. No masses.Marland Kitchen Psychiatric Judgement and insight Intact.. No evidence of depression, anxiety, or agitation.. Electronic Signature(s) Signed: 06/08/2015 12:30:49 PM By: Christin Fudge MD, FACS Entered By: Christin Fudge on 06/08/2015 09:16:32 [name] [name]  ([id number]) -------------------------------------------------------------------------------- Physician Orders Details Patient Name: [name] [name] Date of Service: 06/08/2015 8:15 AM Medical Record Number: [id number] Patient Account Number: [id number] Date of Birth/Sex: 07-26-1941 (74 y.o. Male) Treating RN: Montey Hora Primary Care Physician: Apolonio Schneiders Other Clinician: Referring Physician: Apolonio Schneiders Treating Physician/Extender: Frann Rider in Treatment: 11 Verbal / Phone Orders: Yes Clinician: Montey Hora Read Back and Verified: Yes Diagnosis Coding Wound Cleansing Wound #1 Left,Lateral Calcaneous o Clean wound with Normal Saline. Anesthetic Wound #1 Left,Lateral Calcaneous o Topical Lidocaine 4% cream applied to wound bed prior to debridement Primary Wound Dressing Wound #1 Left,Lateral Calcaneous o Aquacel Ag Secondary Dressing Wound #1 Left,Lateral Calcaneous o Boardered Foam Dressing Dressing Change Frequency Wound #1 Left,Lateral Calcaneous o Change dressing every day. Follow-up Appointments Wound #1 Left,Lateral Calcaneous o Return Appointment in 1 week. Off-Loading Wound #1 Left,Lateral Calcaneous o Open toe surgical shoe with peg assist. Notes check authorization for epifix Electronic Signature(s) Signed: 06/08/2015 12:30:49 PM By: Christin Fudge MD, FACS Signed: 06/08/2015 4:37:27 PM By: Waynetta Pean ([id number]) Entered By: Montey Hora on 06/08/2015 08:54:53 [name] [name] ([id number]) -------------------------------------------------------------------------------- Problem List Details Patient Name: [name] [name] Date of Service: 06/08/2015 8:15 AM Medical Record Number: [id number] Patient Account Number: [id number] Date of Birth/Sex: May 25, 1941 (74 y.o. 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 Male) Treating RN: Primary Care Physician: Apolonio Schneiders Other Clinician: Referring Physician: Arline Asp  Mitzi Hansen Treating Physician/Extender: Frann Rider in Treatment: 11 Subjective Chief Complaint Information obtained from Patient Patient presents to the wound care center for a consult due non healing wound pleasant 74 year old gentleman who comes with a ulcerated area on the left lateral heel and he's had it for about a month. History of Present Illness (HPI) 74 year old gentleman who comes as a self-referral and has been known to be a diabetic on treatment for the last 30 years and takes insulin for this. Developed a callus on the left lateral heel and does not know exactly what type of footwear cause this. Did see his dermatologist but prescribed some medication for him and also applied some Bactroban cream. Able to soften this area and remove some of the dead skin but he now has a definite ulcer there and knowing he's been a diabetic he came as a self-referral for further treatment. He's been a nonsmoker all his life and hasn't had no problems with any arterial disease and has no workup done for this. 04/06/2015 -- he has been doing his dressing  on alternate days but sometimes he runs that the dressing doesn't stay on for long. He also informs me that he is going to be out on vacation this weekend and will be back only in 2 weeks. 04/27/2015 -- his blood sugars under good control and he says he's been offloading as much as possible and has no pain whatsoever. 05/04/2015 -- he has been doing very well his blood sugars are well under control and his offloading and changing of dressing has been very compliant. 05/18/2015 - Due to some family issues he's not been seen back for 2 weeks. he ran out of his Prisma dressing and went and bought a duodenum type of dressing form the pharmacy and has used it for the last 48 hours. He started getting a minimal swelling in his leg and had noticed a change in his wound. 06/08/2015 -- he is back after 2 weeks because of some family commitments and says has been very compliant but is frustrated that his wound has not healed yet. 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 [name] [name], [name] [name]. ([id number]) Objective Constitutional Pulse regular. Respirations normal and unlabored. Afebrile. Vitals Time Taken: 8:22 AM, Height: 71 in, Weight: 180 lbs, BMI: 25.1, Temperature: 97.6 F, Pulse: 57 bpm, Respiratory Rate: 16 breaths/min, Blood Pressure: 119/71 mmHg. Eyes Nonicteric. Reactive to light. Ears, Nose, Mouth, and Throat Lips, teeth, and gums WNL.Marland Kitchen Moist mucosa without lesions . Neck supple and nontender. No palpable supraclavicular or cervical adenopathy. Normal sized without goiter. Respiratory WNL. No retractions.. Cardiovascular Pedal Pulses WNL. No clubbing, cyanosis or edema. Musculoskeletal Adexa without tenderness or enlargement.. Digits and nails w/o clubbing, cyanosis, infection, petechiae, ischemia, or inflammatory  conditions.Marland Kitchen Psychiatric Judgement and insight Intact.. No evidence of depression, anxiety, or agitation.. Integumentary (Hair, Skin) the wound is clean and has some hyper granulation tissue and other than that this could amount of healthy skin surrounding the ulceration.. No crepitus or fluctuance. No peri-wound warmth or erythema. No masses.. Wound #1 status is Open. Original cause of wound was Gradually Appeared. The wound is located on the Left,Lateral Calcaneous. The wound measures 0.9cm length x 0.9cm width x 0.1cm depth; 0.636cm^2 area and 0.064cm^3 volume. The wound is limited to skin breakdown. There  is no tunneling or undermining noted. There is a small amount of sanguinous drainage noted. The wound margin is flat and intact. There is large (67-100%) red, pink granulation within the wound bed. There is no necrotic tissue within the wound bed. The periwound skin appearance exhibited: Callus, Moist. The periwound skin appearance did not exhibit: Crepitus, Excoriation, Fluctuance, Friable, Induration, Localized Edema, Rash, Scarring, Dry/Scaly, Maceration, Atrophie Blanche, Cyanosis, Ecchymosis, Hemosiderin Staining, Mottled, Pallor, Rubor, Erythema. Periwound temperature was noted as No Abnormality. 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 After trying various wound care modalities I had offered him a total contact cast but he is unable to tolerate it because of psychological issues. We will change over to silver alginate and at this stage I believe he will benefit from a skin substitute like Epifix and we will requisition this from his insurance company. He will also do all he can to offload this  area as much as possible and says he is currently compliant and see me back next week. Procedures Wound #1 Wound #1 is a Diabetic Wound/Ulcer of the Lower Extremity located on the Left,Lateral Calcaneous . An CHEM CAUT GRANULATION TISS procedure was performed by Kaleeyah Cuffie, Jackson Latino., MD. Notes: The wound is clean and has some hyper granulation tissue and use a silver nitrate stick to cauterize this. Plan Wound Cleansing: Wound #1 Left,Lateral Calcaneous: Clean wound with Normal Saline. Anesthetic: Wound #1 Left,Lateral Calcaneous: Topical Lidocaine 4% cream applied to wound bed prior to debridement Primary Wound Dressing: Wound #1 Left,Lateral Calcaneous: Aquacel Ag Secondary Dressing: [name] [name] ([id number]) Wound #1 Left,Lateral Calcaneous: Boardered Foam Dressing Dressing Change Frequency: Wound #1 Left,Lateral Calcaneous: Change dressing every day. Follow-up Appointments: Wound #1 Left,Lateral Calcaneous: Return Appointment in 1 week. Off-Loading: Wound #1 Left,Lateral Calcaneous: Open toe surgical shoe with peg assist. General Notes: check authorization for epifix It has been 10 weeks since we have been treating him for a superficial skin ulceration on the left plantar aspect of his heel. After trying various wound care modalities I had offered him a total contact cast but he is unable to tolerate it because of psychological issues. We will change over to silver alginate and at this stage I believe he will benefit from a skin substitute like Epifix and we will requisition this from his insurance company. He will also do all he can to offload this area as much as possible and says he is currently compliant and see me back next week Electronic Signature(s) Signed: 06/08/2015 12:30:49 PM By: Christin Fudge MD, FACS Entered By: Christin Fudge on 06/08/2015 09:19:08 [name] [name]  ([id number]) -------------------------------------------------------------------------------- SuperBill Details Patient Name: [name] [name] Date of Service: 06/08/2015 Medical Record Number: [id number] Patient Account Number: [id number] Date of Birth/Sex: [birth year] (74 y.o. 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 Facility Procedures CPT4: Description Modifier Quantity Code [id number] - CHEM CAUT GRANULATION TISS 1 ICD-10 Description Diagnosis E11.621 Type 2 diabetes mellitus with foot ulcer L97.421 Non-pressure  chronic ulcer of left heel and midfoot limited to breakdown of skin Physician Procedures CPT4: Description Modifier Quantity Code [id number] - WC PHYS CHEM CAUT GRAN TISSUE 1 ICD-10 Description Diagnosis E11.621 Type 2 diabetes mellitus with foot ulcer 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

## 2015-06-15 ENCOUNTER — Encounter: Payer: Medicare Other | Attending: Surgery | Admitting: Surgery

## 2015-06-15 DIAGNOSIS — L97421 Non-pressure chronic ulcer of left heel and midfoot limited to breakdown of skin: Secondary | ICD-10-CM | POA: Diagnosis not present

## 2015-06-15 DIAGNOSIS — F40240 Claustrophobia: Secondary | ICD-10-CM | POA: Insufficient documentation

## 2015-06-15 DIAGNOSIS — F419 Anxiety disorder, unspecified: Secondary | ICD-10-CM | POA: Insufficient documentation

## 2015-06-15 DIAGNOSIS — Z794 Long term (current) use of insulin: Secondary | ICD-10-CM | POA: Insufficient documentation

## 2015-06-15 DIAGNOSIS — E11621 Type 2 diabetes mellitus with foot ulcer: Secondary | ICD-10-CM | POA: Insufficient documentation

## 2015-06-16 NOTE — Progress Notes (Signed)
[name], [name] ([id number]) Visit Report for 06/15/2015 Arrival Information Details Patient Name: [name] [name], [name] [name] Date of Service: 06/15/2015 8:45 AM Medical Record Number: [id number] Patient Account Number: [id number] Date of Birth/Sex: 04-21-1941 (74 y.o. Male) Treating RN: Cornell Barman Primary Care Physician: Apolonio Schneiders Other Clinician: Referring Physician: Apolonio Schneiders Treating Physician/Extender: Frann Rider in Treatment: 12 Visit Information History Since Last Visit Added or deleted any medications: No Patient Arrived: [name] [name] Any new allergies or adverse reactions: No Arrival Time: 09:01 Had a fall or experienced change in No Accompanied By: self activities of daily living that may affect Transfer Assistance: None risk of falls: Patient Identification Verified: Yes Signs or symptoms of abuse/neglect since last No Secondary Verification Process Yes visito Completed: Hospitalized since last visit: No Patient Has Alerts: Yes Has Dressing in Place as Prescribed: Yes Patient Alerts: Type II Diabetic Pain Present Now: No AIC 6.2 ABI L 1.20 R 1.12 Electronic Signature(s) Signed: 06/15/2015 5:42:28 PM By: Gretta Cool, RN, BSN, Kim RN, BSN Entered By: Gretta Cool, RN, BSN, Kim on 06/15/2015 62:69:48 [name] ([id number]) -------------------------------------------------------------------------------- Clinic Level of Care Assessment Details Patient Name: [name] Date of Service: 06/15/2015 8:45 AM Medical Record Number: [id number] Patient Account Number: [id number] Date of Birth/Sex: February 09, 1941 (74 y.o. Male) Treating RN: Cornell Barman Primary Care Physician: Apolonio Schneiders Other Clinician: Referring Physician: Apolonio Schneiders Treating Physician/Extender: Frann Rider in Treatment: 12 Clinic Level of Care Assessment Items TOOL 4 Quantity Score []  - Use when only an EandM is performed on FOLLOW-UP visit 0 ASSESSMENTS - Nursing Assessment / Reassessment X - Reassessment  of Co-morbidities (includes updates in patient status) 1 10 []  - Reassessment of Adherence to Treatment Plan 0 ASSESSMENTS - Wound and Skin Assessment / Reassessment X - Simple Wound Assessment / Reassessment - one wound 1 5 []  - Complex Wound Assessment / Reassessment - multiple wounds 0 []  - Dermatologic / Skin Assessment (not related to wound area) 0 ASSESSMENTS - Focused Assessment []  - Circumferential Edema Measurements - multi extremities 0 []  - Nutritional Assessment / Counseling / Intervention 0 []  - Lower Extremity Assessment (monofilament, tuning fork, pulses) 0 []  - Peripheral Arterial Disease Assessment (using hand held doppler) 0 ASSESSMENTS - Ostomy and/or Continence Assessment and Care []  - Incontinence Assessment and Management 0 []  - Ostomy Care Assessment and Management (repouching, etc.) 0 PROCESS - Coordination of Care X - Simple Patient / Family Education for ongoing care 1 15 []  - Complex (extensive) Patient / Family Education for ongoing care 0 X - Staff obtains Programmer, systems, Records, Test Results / Process Orders 1 10 []  - Staff telephones HHA, Nursing Homes / Clarify orders / etc 0 []  - Routine Transfer to another Facility (non-emergent condition) 0 ELIGIO, [name]. ([id number]) []  - Routine Hospital Admission (non-emergent condition) 0 []  - New Admissions / Biomedical engineer / Ordering NPWT, Apligraf, etc. 0 []  - Emergency Hospital Admission (emergent condition) 0 X - Simple Discharge Coordination 1 10 []  - Complex (extensive) Discharge Coordination 0 PROCESS - Special Needs []  - Pediatric / Minor Patient Management 0 []  - Isolation Patient Management 0 []  - Hearing / Language / Visual special needs 0 []  - Assessment of Community assistance (transportation, D/C planning, etc.) 0 []  - Additional assistance / Altered mentation 0 []  - Support Surface(s) Assessment (bed, cushion, seat, etc.) 0 INTERVENTIONS - Wound Cleansing / Measurement X - Simple Wound  Cleansing - one wound 1 5 []  - Complex Wound Cleansing - multiple wounds 0 X -  Wound Imaging (photographs - any number of wounds) 1 5 []  - Wound Tracing (instead of photographs) 0 X - Simple Wound Measurement - one wound 1 5 []  - Complex Wound Measurement - multiple wounds 0 INTERVENTIONS - Wound Dressings X - Small Wound Dressing one or multiple wounds 1 10 []  - Medium Wound Dressing one or multiple wounds 0 []  - Large Wound Dressing one or multiple wounds 0 []  - Application of Medications - topical 0 []  - Application of Medications - injection 0 INTERVENTIONS - Miscellaneous []  - External ear exam 0 [name], [name]. ([id number]) []  - Specimen Collection (cultures, biopsies, blood, body fluids, etc.) 0 []  - Specimen(s) / Culture(s) sent or taken to Lab for analysis 0 []  - Patient Transfer (multiple staff / Harrel Lemon Lift / Similar devices) 0 []  - Simple Staple / Suture removal (25 or less) 0 []  - Complex Staple / Suture removal (26 or more) 0 []  - Hypo / Hyperglycemic Management (close monitor of Blood Glucose) 0 []  - Ankle / Brachial Index (ABI) - do not check if billed separately 0 X - Vital Signs 1 5 Has the patient been seen at the hospital within the last three years: Yes Total Score: 80 Level Of Care: New/Established - Level 3 Electronic Signature(s) Signed: 06/15/2015 5:42:28 PM By: Gretta Cool, RN, BSN, Kim RN, BSN Entered By: Gretta Cool, RN, BSN, Kim on 06/15/2015 09:14:22 [name] ([id number]) -------------------------------------------------------------------------------- Encounter Discharge Information Details Patient Name: [name] Date of Service: 06/15/2015 8:45 AM Medical Record Number: [id number] Patient Account Number: [id number] Date of Birth/Sex: 1941/02/24 (74 y.o. Male) Treating RN: Cornell Barman Primary Care Physician: Apolonio Schneiders Other Clinician: Referring Physician: Apolonio Schneiders Treating Physician/Extender: Frann Rider in Treatment: 12 Encounter  Discharge Information Items Discharge Pain Level: 0 Discharge Condition: Stable Ambulatory Status: Cane Discharge Destination: Home Private Transportation: Auto Accompanied By: self Schedule Follow-up Appointment: Yes Medication Reconciliation completed and Yes provided to Patient/Care Finas Delone: Clinical Summary of Care: Electronic Signature(s) Signed: 06/15/2015 5:42:28 PM By: Gretta Cool, RN, BSN, Kim RN, BSN Entered By: Gretta Cool, RN, BSN, Kim on 06/15/2015 09:22:55 [name] ([id number]) -------------------------------------------------------------------------------- Lower Extremity Assessment Details Patient Name: [name] Date of Service: 06/15/2015 8:45 AM Medical Record Number: [id number] Patient Account Number: [id number] Date of Birth/Sex: 1941/04/02 (74 y.o. Male) Treating RN: Cornell Barman Primary Care Physician: Apolonio Schneiders Other Clinician: Referring Physician: Apolonio Schneiders Treating Physician/Extender: Frann Rider in Treatment: 12 Vascular Assessment Pulses: Posterior Tibial Dorsalis Pedis Palpable: [Left:Yes] Extremity colors, hair growth, and conditions: Extremity Color: [Left:Normal] Hair Growth on Extremity: [Left:Yes] Temperature of Extremity: [Left:Warm] Capillary Refill: [Left:< 3 seconds] Toe Nail Assessment Left: Right: Thick: No Discolored: No Deformed: No Improper Length and Hygiene: No Electronic Signature(s) Signed: 06/15/2015 5:42:28 PM By: Gretta Cool, RN, BSN, Kim RN, BSN Entered By: Gretta Cool, RN, BSN, Kim on 06/15/2015 09:06:19 [name] ([id number]) -------------------------------------------------------------------------------- Multi Wound Chart Details Patient Name: [name] Date of Service: 06/15/2015 8:45 AM Medical Record Number: [id number] Patient Account Number: [id number] Date of Birth/Sex: [birth year] (74 y.o. Male) Treating RN: Cornell Barman Primary Care Physician: Apolonio Schneiders Other Clinician: Referring Physician: Apolonio Schneiders Treating Physician/Extender: Frann Rider in Treatment: 12 Vital Signs Height(in): 71 Pulse(bpm): 68 Weight(lbs): 180 Blood Pressure 98/58 (mmHg): Body Mass Index(BMI): 25 Temperature(F): 98.1 Respiratory Rate 16 (breaths/min): Photos: [1:No Photos] [N/A:N/A] Wound Location: [1:Left Calcaneous - Lateral] [N/A:N/A] Wounding Event: [1:Gradually Appeared] [N/A:N/A] Primary Etiology: [1:Diabetic Wound/Ulcer of the Lower Extremity] [N/A:N/A] Comorbid History: [1:Type II Diabetes] [N/A:N/A]  Date Acquired: [1:02/12/2015] [N/A:N/A] Weeks of Treatment: [1:12] [N/A:N/A] Wound Status: [1:Open] [N/A:N/A] Measurements L x W x D 0.8x1.5x0.1 [N/A:N/A] (cm) Area (cm) : [1:0.942] [N/A:N/A] Volume (cm) : [1:0.094] [N/A:N/A] % Reduction in Area: [1:-7.00%] [N/A:N/A] % Reduction in Volume: -6.80% [N/A:N/A] Classification: [1:Grade 1] [N/A:N/A] Exudate Amount: [1:Small] [N/A:N/A] Exudate Type: [1:Sanguinous] [N/A:N/A] Exudate Color: [1:red] [N/A:N/A] Wound Margin: [1:Flat and Intact] [N/A:N/A] Granulation Amount: [1:Large (67-100%)] [N/A:N/A] Granulation Quality: [1:Red, Pink, Friable] [N/A:N/A] Necrotic Amount: [1:None Present (0%)] [N/A:N/A] Exposed Structures: [1:Fascia: No Fat: No Tendon: No Muscle: No Joint: No Bone: No] [N/A:N/A] Limited to Skin Breakdown Epithelialization: Large (67-100%) N/A N/A Periwound Skin Texture: Callus: Yes N/A N/A Edema: No Excoriation: No Induration: No Crepitus: No Fluctuance: No Friable: No Rash: No Scarring: No Periwound Skin Moist: Yes N/A N/A Moisture: Maceration: No Dry/Scaly: No Periwound Skin Color: Atrophie Blanche: No N/A N/A Cyanosis: No Ecchymosis: No Erythema: No Hemosiderin Staining: No Mottled: No Pallor: No Rubor: No Temperature: No Abnormality N/A N/A Tenderness on No N/A N/A Palpation: Wound Preparation: Ulcer Cleansing: N/A N/A Rinsed/Irrigated with Saline Topical Anesthetic Applied:  None Treatment Notes Electronic Signature(s) Signed: 06/15/2015 5:42:28 PM By: Gretta Cool, RN, BSN, Kim RN, BSN Entered By: Gretta Cool, RN, BSN, Kim on 06/15/2015 09:13:07 [name] ([id number]) -------------------------------------------------------------------------------- [place] Details Patient Name: [name] Date of Service: 06/15/2015 8:45 AM Medical Record Number: [id number] Patient Account Number: [id number] Date of Birth/Sex: 1941/08/23 (74 y.o. Male) Treating RN: Cornell Barman Primary Care Physician: Apolonio Schneiders Other Clinician: Referring Physician: Apolonio Schneiders Treating Physician/Extender: Frann Rider in Treatment: 12 Active Inactive Orientation to the Wound Care Program Nursing Diagnoses: Knowledge deficit related to the wound healing center program Goals: Patient/caregiver will verbalize understanding of the [facility] Program Date Initiated: 03/23/2015 Goal Status: Active Interventions: Provide education on orientation to the wound center Notes: Wound/Skin Impairment Nursing Diagnoses: Knowledge deficit related to ulceration/compromised skin integrity Goals: Patient/caregiver will verbalize understanding of skin care regimen Date Initiated: 03/23/2015 Goal Status: Active Ulcer/skin breakdown will heal within 14 weeks Date Initiated: 03/23/2015 Goal Status: Active Interventions: Assess patient/caregiver ability to obtain necessary supplies Assess patient/caregiver ability to perform ulcer/skin care regimen upon admission and as needed Assess ulceration(s) every visit Provide education on ulcer and skin care Treatment Activities: Skin care regimen initiated : 06/15/2015 Topical wound management initiated : 06/15/2015 [name] ([id number]) Notes: Electronic Signature(s) Signed: 06/15/2015 5:42:28 PM By: Gretta Cool, RN, BSN, Kim RN, BSN Entered By: Gretta Cool, RN, BSN, Kim on 06/15/2015 09:13:00 [name]  ([id number]) -------------------------------------------------------------------------------- Pain Assessment Details Patient Name: [name] Date of Service: 06/15/2015 8:45 AM Medical Record Number: [id number] Patient Account Number: [id number] Date of Birth/Sex: 21-Jan-1941 (74 y.o. Male) Treating RN: Cornell Barman Primary Care Physician: Apolonio Schneiders Other Clinician: Referring Physician: Apolonio Schneiders Treating Physician/Extender: Frann Rider in Treatment: 12 Active Problems Location of Pain Severity and Description of Pain Patient Has Paino No Site Locations Pain Management and Medication Current Pain Management: Electronic Signature(s) Signed: 06/15/2015 5:42:28 PM By: Gretta Cool, RN, BSN, Kim RN, BSN Entered By: Gretta Cool, RN, BSN, Kim on 06/15/2015 09:02:27 [name] ([id number]) -------------------------------------------------------------------------------- Patient/Caregiver Education Details Patient Name: [name] Date of Service: 06/15/2015 8:45 AM Medical Record Number: [id number] Patient Account Number: [id number] Date of Birth/Gender: Jun 09, 1941 (74 y.o. Male) Treating RN: Cornell Barman Primary Care Physician: Apolonio Schneiders Other Clinician: Referring Physician: Apolonio Schneiders Treating Physician/Extender: Frann Rider in Treatment: 12 Education Assessment Education Provided To: Patient Education Topics Provided Wound/Skin Impairment:  Handouts: Caring for Your Ulcer, Skin Care Do's and Dont's Methods: Demonstration Responses: State content correctly Electronic Signature(s) Signed: 06/15/2015 5:42:28 PM By: Gretta Cool, RN, BSN, Kim RN, BSN Entered By: Gretta Cool, RN, BSN, Kim on 06/15/2015 09:23:07 [name] ([id number]) -------------------------------------------------------------------------------- Wound Assessment Details Patient Name: [name] Date of Service: 06/15/2015 8:45 AM Medical Record Number: [id number] Patient Account Number: [id number] Date  of Birth/Sex: [birth year] (74 y.o. Male) Treating RN: Cornell Barman Primary Care Physician: Apolonio Schneiders Other Clinician: Referring Physician: Apolonio Schneiders Treating Physician/Extender: Frann Rider in Treatment: 12 Wound Status Wound Number: 1 Primary Diabetic Wound/Ulcer of the Lower Etiology: Extremity Wound Location: Left Calcaneous - Lateral Wound Status: Open Wounding Event: Gradually Appeared Comorbid Type II Diabetes Date Acquired: 02/12/2015 History: Weeks Of Treatment: 12 Clustered Wound: No Photos Photo Uploaded By: Gretta Cool, RN, BSN, Kim on 06/15/2015 10:30:38 Wound Measurements Length: (cm) 0.8 Width: (cm) 1.5 Depth: (cm) 0.1 Area: (cm) 0.942 Volume: (cm) 0.094 % Reduction in Area: -7% % Reduction in Volume: -6.8% Epithelialization: Large (67-100%) Tunneling: No Undermining: No Wound Description Classification: Grade 1 Wound Margin: Flat and Intact Exudate Amount: Small Exudate Type: Sanguinous Exudate Color: red Foul Odor After Cleansing: No Wound Bed Granulation Amount: Large (67-100%) Exposed Structure Granulation Quality: Red, Pink, Friable Fascia Exposed: No Necrotic Amount: None Present (0%) Fat Layer Exposed: No Tendon Exposed: No KADIN, CANIPE. ([id number]) Muscle Exposed: No Joint Exposed: No Bone Exposed: No Limited to Skin Breakdown Periwound Skin Texture Texture Color No Abnormalities Noted: No No Abnormalities Noted: No Callus: Yes Atrophie Blanche: No Crepitus: No Cyanosis: No Excoriation: No Ecchymosis: No Fluctuance: No Erythema: No Friable: No Hemosiderin Staining: No Induration: No Mottled: No Localized Edema: No Pallor: No Rash: No Rubor: No Scarring: No Temperature / Pain Moisture Temperature: No Abnormality No Abnormalities Noted: No Dry / Scaly: No Maceration: No Moist: Yes Wound Preparation Ulcer Cleansing: Rinsed/Irrigated with Saline Topical Anesthetic Applied: None Treatment Notes Wound #1 (Left,  Lateral Calcaneous) 2. Anesthetic Topical Lidocaine 4% cream to wound bed prior to debridement 4. Dressing Applied: Aquacel Ag 5. Secondary Dressing Applied Bordered Foam Dressing Notes stretch net; darco shoe with peg assist Electronic Signature(s) Signed: 06/15/2015 5:42:28 PM By: Gretta Cool, RN, BSN, Kim RN, BSN Entered By: Gretta Cool, RN, BSN, Kim on 06/15/2015 09:12:47 [name] ([id number]) -------------------------------------------------------------------------------- Vitals Details Patient Name: [name] Date of Service: 06/15/2015 8:45 AM Medical Record Number: [id number] Patient Account Number: [id number] Date of Birth/Sex: [birth year] (74 y.o. Male) Treating RN: Cornell Barman Primary Care Physician: Apolonio Schneiders Other Clinician: Referring Physician: Apolonio Schneiders Treating Physician/Extender: Frann Rider in Treatment: 12 Vital Signs Time Taken: 09:02 Temperature (F): 98.1 Height (in): 71 Pulse (bpm): 68 Weight (lbs): 180 Respiratory Rate (breaths/min): 16 Body Mass Index (BMI): 25.1 Blood Pressure (mmHg): 98/58 Reference Range: 80 - 120 mg / dl Electronic Signature(s) Signed: 06/15/2015 5:42:28 PM By: Gretta Cool, RN, BSN, Kim RN, BSN Entered By: Gretta Cool, RN, BSN, Kim on 06/15/2015 09:05:29

## 2015-06-16 NOTE — Progress Notes (Signed)
NYLE, LIMB ([id number]) Visit Report for 06/15/2015 Chief Complaint Document Details Patient Name: [name] [name], [name] [name] Date of Service: 06/15/2015 8:45 AM Medical Record Number: [id number] Patient Account Number: [id number] Date of Birth/Sex: [birth year] (74 y.o. Male) Treating RN: Primary Care Physician: Apolonio Schneiders Other Clinician: Referring Physician: Apolonio Schneiders Treating Physician/Extender: Frann Rider in Treatment: 12 Information Obtained from: Patient Chief Complaint Patient presents to the wound care center for a consult due non healing wound pleasant 74 year old gentleman who comes with a ulcerated area on the left lateral heel and he's had it for about a month. Electronic Signature(s) Signed: 06/15/2015 12:24:54 PM By: Christin Fudge MD, FACS Entered By: Christin Fudge on 06/15/2015 09:19:10 [name] [name] ([id number]) -------------------------------------------------------------------------------- HPI Details Patient Name: [name] [name] Date of Service: 06/15/2015 8:45 AM Medical Record Number: [id number] Patient Account Number: [id number] Date of Birth/Sex: May 11, 1941 (74 y.o. Male) Treating RN: Primary Care Physician: Apolonio Schneiders Other Clinician: Referring Physician: Apolonio Schneiders Treating Physician/Extender: Frann Rider in Treatment: 12 History of Present Illness HPI Description: 74 year old gentleman who comes as a self-referral and has been known to be a diabetic on treatment for the last 30 years and takes insulin for this. Developed a callus on the left lateral heel and does not know exactly what type of footwear cause this. Did see his dermatologist but prescribed some medication for him and also applied some Bactroban cream. Able to soften this area and remove some of the dead skin but he now has a definite ulcer there and knowing he's been a diabetic he came as a self-referral for further treatment. He's been a nonsmoker all his life and hasn't had no  problems with any arterial disease and has no workup done for this. 04/06/2015 -- he has been doing his dressing on alternate days but sometimes he runs that the dressing doesn't stay on for long. He also informs me that he is going to be out on vacation this weekend and will be back only in 2 weeks. 04/27/2015 -- his blood sugars under good control and he says he's been offloading as much as possible and has no pain whatsoever. 05/04/2015 -- he has been doing very well his blood sugars are well under control and his offloading and changing of dressing has been very compliant. 05/18/2015 - Due to some family issues he's not been seen back for 2 weeks. he ran out of his Prisma dressing and went and bought a duodenum type of dressing form the pharmacy and has used it for the last 48 hours. He started getting a minimal swelling in his leg and had noticed a change in his wound. 06/08/2015 -- he is back after 2 weeks because of some family commitments and says has been very compliant but is frustrated that his wound has not healed yet. 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 Electronic Signature(s) Signed: 06/15/2015 12:24:54 PM By: Christin Fudge MD, FACS Entered By: Christin Fudge on 06/15/2015 09:19:14 [name] [name] ([id number]) -------------------------------------------------------------------------------- Physical Exam Details Patient Name: [name] [name] Date of Service: 06/15/2015 8:45 AM Medical Record Number: [id number] Patient Account Number: [id number] Date of Birth/Sex: 20-Jun-1941 (74 y.o. Male) Treating RN: Primary Care Physician: Apolonio Schneiders Other Clinician: Referring Physician: Apolonio Schneiders Treating Physician/Extender: Frann Rider in Treatment: 12 Constitutional . Pulse regular.  Respirations normal  and unlabored. Afebrile. . Eyes Nonicteric. Reactive to light. Ears, Nose, Mouth, and Throat Lips, teeth, and gums WNL.Marland Kitchen Moist mucosa without lesions . Neck supple and nontender. No palpable supraclavicular or cervical adenopathy. Normal sized without goiter. Respiratory WNL. No retractions.. Cardiovascular Pedal Pulses WNL. No clubbing, cyanosis or edema. Musculoskeletal Adexa without tenderness or enlargement.. Digits and nails w/o clubbing, cyanosis, infection, petechiae, ischemia, or inflammatory conditions.. Integumentary (Hair, Skin) the wound is clean and has good healthy granulation tissue and islands of epithelization and overall much improvement.. No crepitus or fluctuance. No peri-wound warmth or erythema. No masses.Marland Kitchen Psychiatric Judgement and insight Intact.. No evidence of depression, anxiety, or agitation.. Electronic Signature(s) Signed: 06/15/2015 12:24:54 PM By: Christin Fudge MD, FACS Entered By: Christin Fudge on 06/15/2015 09:19:55 [name] [name] ([id number]) -------------------------------------------------------------------------------- Physician Orders Details Patient Name: [name] [name] Date of Service: 06/15/2015 8:45 AM Medical Record Number: [id number] Patient Account Number: [id number] Date of Birth/Sex: [birth year] (74 y.o. Male) Treating RN: Cornell Barman Primary Care Physician: Apolonio Schneiders Other Clinician: Referring Physician: Apolonio Schneiders Treating Physician/Extender: Frann Rider in Treatment: 12 Verbal / Phone Orders: Yes Clinician: Cornell Barman Read Back and Verified: Yes Diagnosis Coding Wound Cleansing Wound #1 Left,Lateral Calcaneous o Clean wound with Normal Saline. Primary Wound Dressing Wound #1 Left,Lateral Calcaneous o Aquacel Ag Secondary Dressing Wound #1 Left,Lateral Calcaneous o Boardered Foam Dressing Dressing Change Frequency Wound #1 Left,Lateral Calcaneous o Change dressing every day. Follow-up  Appointments Wound #1 Left,Lateral Calcaneous o Return Appointment in 1 week. Off-Loading Wound #1 Left,Lateral Calcaneous o Open toe surgical shoe with peg assist. Electronic Signature(s) Signed: 06/15/2015 12:24:54 PM By: Christin Fudge MD, FACS Signed: 06/15/2015 5:42:28 PM By: Gretta Cool RN, BSN, Kim RN, BSN Entered By: Gretta Cool, RN, BSN, Kim on 06/15/2015 09:13:33 [name] [name] ([id number]) -------------------------------------------------------------------------------- Problem List Details Patient Name: [name] [name], [name] [name] Date of Service: 06/15/2015 8:45 AM Medical Record Number: [id number] Patient Account Number: [id number] Date of Birth/Sex: 12-Aug-1941 (74 y.o. 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 Male) Treating RN: Primary Care Physician: Apolonio Schneiders Other Clinician: Referring Physician: Apolonio Schneiders Treating Physician/Extender: Frann Rider in Treatment: 12 Subjective Chief Complaint Information obtained from Patient Patient presents to the wound care center for a consult due non healing wound pleasant 74 year old gentleman who comes  with a ulcerated area on the left lateral heel and he's had it for about a month. History of Present Illness (HPI) 74 year old gentleman who comes as a self-referral and has been known to be a diabetic on treatment for the last 30 years and takes insulin for this. Developed a callus on the left lateral heel and does not know exactly what type of footwear cause this. Did see his dermatologist but prescribed some medication for him and also applied some Bactroban cream. Able to soften this area and remove some of the dead skin but he now  has a definite ulcer there and knowing he's been a diabetic he came as a self-referral for further treatment. He's been a nonsmoker all his life and hasn't had no problems with any arterial disease and has no workup done for this. 04/06/2015 -- he has been doing his dressing on alternate days but sometimes he runs that the dressing doesn't stay on for long. He also informs me that he is going to be out on vacation this weekend and will be back only in 2 weeks. 04/27/2015 -- his blood sugars under good control and he says he's been offloading as much as possible and has no pain whatsoever. 05/04/2015 -- he has been doing very well his blood sugars are well under control and his offloading and changing of dressing has been very compliant. 05/18/2015 - Due to some family issues he's not been seen back for 2 weeks. he ran out of his Prisma dressing and went and bought a duodenum type of dressing form the pharmacy and has used it for the last 48 hours. He started getting a minimal swelling in his leg and had noticed a change in his wound. 06/08/2015 -- he is back after 2 weeks because of some family commitments and says has been very compliant but is frustrated that his wound has not healed yet. 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 [name] [name], [name] [name]. ([id number]) Objective Constitutional Pulse regular. Respirations normal and unlabored. Afebrile. Vitals Time Taken: 9:02 AM, Height: 71 in, Weight: 180 lbs, BMI: 25.1, Temperature: 98.1 F, Pulse: 68 bpm, Respiratory Rate: 16 breaths/min, Blood Pressure: 98/58 mmHg. Eyes Nonicteric. Reactive to light. Ears, Nose, Mouth, and Throat Lips, teeth, and gums WNL.Marland Kitchen Moist mucosa without lesions . Neck supple and nontender. No palpable supraclavicular or cervical adenopathy. Normal sized without goiter. Respiratory WNL. No retractions.. Cardiovascular Pedal Pulses WNL. No clubbing, cyanosis or edema. Musculoskeletal Adexa without tenderness or enlargement.. Digits and nails w/o clubbing, cyanosis, infection, petechiae, ischemia, or inflammatory conditions.Marland Kitchen Psychiatric Judgement and insight Intact.. No evidence of depression, anxiety, or agitation.. Integumentary (Hair, Skin) the wound is clean and has good healthy granulation tissue and islands of epithelization and overall much improvement.. No crepitus or fluctuance. No peri-wound warmth or erythema. No masses.. Wound #1 status is Open. Original cause of wound was Gradually Appeared. The wound is located on the Left,Lateral Calcaneous. The wound measures 0.8cm length x 1.5cm width x 0.1cm depth; 0.942cm^2 area and 0.094cm^3 volume. The wound is limited to skin breakdown. There is no tunneling or undermining noted. There is a small amount of sanguinous drainage noted. The wound margin is flat and intact. There is large (67-100%) red, pink, friable granulation within the wound bed. There is no necrotic tissue within the wound bed. The periwound skin appearance exhibited: Callus, Moist. The periwound skin appearance did not exhibit: Crepitus, Excoriation, Fluctuance, Friable, Induration, Localized Edema, Rash, Scarring, Dry/Scaly, Maceration, Atrophie Blanche, Cyanosis, Ecchymosis,  Hemosiderin Staining, Mottled, Pallor, Rubor, Erythema. Periwound temperature was noted as No Abnormality. 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 His Epifix is not  available yet and hopefully by next week we should have it in place and start applying the skin substitute. Plan Wound Cleansing: Wound #1 Left,Lateral Calcaneous: Clean wound with Normal Saline. Primary Wound Dressing: Wound #1 Left,Lateral Calcaneous: Aquacel Ag Secondary Dressing: Wound #1 Left,Lateral Calcaneous: Boardered Foam Dressing Dressing Change Frequency: Wound #1 Left,Lateral Calcaneous: Change dressing every day. Follow-up Appointments: Wound #1 Left,Lateral Calcaneous: Return Appointment in 1 week. Off-Loading: Wound #1 Left,Lateral Calcaneous: Open toe surgical shoe with peg assist. We will continue with silver alginate and a bordered foam dressing and continue with the offloading as discussed. His Epifix is not available yet and hopefully by next week we should have it in place and start applying the skin substitute. [name] [name], [name] [name] ([id number]) Electronic Signature(s) Signed: 06/15/2015 12:24:54 PM By: Christin Fudge MD, FACS Entered By: Christin Fudge on 06/15/2015 09:20:59 [name] [name] ([id number]) -------------------------------------------------------------------------------- SuperBill Details Patient Name: [name] [name] Date of Service: 06/15/2015 Medical Record Number: [id number] Patient Account Number: [id number] Date of Birth/Sex: 10-02-1941 (74 y.o. 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

## 2015-06-22 ENCOUNTER — Encounter: Payer: Medicare Other | Admitting: Surgery

## 2015-06-22 DIAGNOSIS — L97421 Non-pressure chronic ulcer of left heel and midfoot limited to breakdown of skin: Secondary | ICD-10-CM | POA: Diagnosis not present

## 2015-06-22 NOTE — Progress Notes (Signed)
[name] [name] ([id number]) Visit Report for 06/22/2015 Chief Complaint Document Details Patient Name: [name] [name], [name] [name] Date of Service: 06/22/2015 8:45 AM Medical Record Number: [id number] Patient Account Number: [id number] Date of Birth/Sex: [birth year] (74 y.o. Male) Treating [name] [name]: [name] [name], [name] [name], [name] [name], [name] [name] Primary Care Physician: [name] [name] Other Clinician: Referring Physician: Apolonio [name] Treating Physician/Extender: [name] [name] in Treatment: 13 Information Obtained from: Patient Chief Complaint Patient presents to the wound care center for a consult due non healing wound pleasant 74 year old gentleman who comes with a ulcerated area on the left lateral heel and he's had it for about a month. Electronic Signature(s) Signed: 06/22/2015 9:34:13 AM By: [name] Fudge [name] [name], [name] [name] Entered By: [name] [name] on 06/22/2015 09:34:13 [name] [name] ([id number]) -------------------------------------------------------------------------------- Cellular or Tissue Based Product Details Patient Name: [name] [name] Date of Service: 06/22/2015 8:45 AM Medical Record Number: [id number] Patient Account Number: [id number] Date of Birth/Sex: [birth year] (74 y.o. Male) Treating [name] [name]: [name] [name], [name] [name], [name] [name], [name] [name] Primary Care Physician: [name] [name] Other Clinician: Referring Physician: Apolonio [name] Treating Physician/Extender: [name] [name] in Treatment: 13 Cellular or Tissue Based Wound #1 Left,Lateral Calcaneous Product Type Applied to: Performed By: Physician [name] [name]., [name] [name] Cellular or Tissue Based Epifix Product Type: Time-Out Taken: Yes Location: genitalia / hands / feet / multiple digits Wound Size (sq cm): 0.21 Product Size (sq cm): 2.5 Waste Size (sq cm): 0 Amount of Product Applied (sq cm): 2.5 Lot #: [id number] Order #: [id number] Expiration Date: 03/11/2020 Fenestrated: No Reconstituted: No Secured: Yes Secured With: Steri-Strips Dressing Applied: Yes Primary Dressing:  mepitel Procedural Pain: 0 Post Procedural Pain: 0 Response to Treatment: Procedure was tolerated well Electronic Signature(s) Signed: 06/22/2015 9:34:02 AM By: [name] Fudge [name] [name], [name] [name] Entered By: [name] [name] on 06/22/2015 09:34:02 [name] [name] ([id number]) -------------------------------------------------------------------------------- HPI Details Patient Name: [name] [name] Date of Service: 06/22/2015 8:45 AM Medical Record Number: [id number] Patient Account Number: [id number] Date of Birth/Sex: [birth year] (74 y.o. Male) Treating [name] [name]: [name] [name], [name] [name], [name] [name], [place] Primary Care Physician: [name] [name] Other Clinician: Referring Physician: Apolonio [name] Treating Physician/Extender: [name] [name] in Treatment: 13 History of Present Illness HPI Description: 74 year old gentleman who comes as a self-referral and has been known to be a diabetic on treatment for the last 30 years and takes insulin for this. Developed a callus on the left lateral heel and does not know exactly what type of footwear cause this. Did see his dermatologist but prescribed some medication for him and also applied some Bactroban cream. Able to soften this area and remove some of the dead skin but he now has a definite ulcer there and knowing he's been a diabetic he came as a self-referral for further treatment. He's been a nonsmoker all his life and hasn't had no problems with any arterial disease and has no workup done for this. 04/06/2015 -- he has been doing his dressing on alternate days but sometimes he runs that the dressing doesn't stay on for long. He also informs me that he is going to be out on vacation this weekend and will be back only in 2 weeks. 04/27/2015 -- his blood sugars under good control and he says he's been offloading as much as possible and has no pain whatsoever. 05/04/2015 -- he has been doing very well his blood sugars are well under control and his offloading and changing of dressing has  been very compliant. 05/18/2015 - Due to some family issues he's not been seen back for 2 weeks. he ran out of  his Prisma dressing and went and bought a duodenum type of dressing form the pharmacy and has used it for the last 48 hours. He started getting a minimal swelling in his leg and had noticed a change in his wound. 06/08/2015 -- he is back after 2 weeks because of some family commitments and says has been very compliant but is frustrated that his wound has not healed yet. 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 Electronic Signature(s) Signed: 06/22/2015 9:34:23 AM By: [name] Fudge [name] [name], [name] [name] Entered By: [name] [name] on 06/22/2015 09:34:23 [name] [name] ([id number]) -------------------------------------------------------------------------------- Physical Exam Details Patient Name: [name] [name] Date of Service: 06/22/2015 8:45 AM Medical Record Number: [id number] Patient Account Number: [id number] Date of Birth/Sex: September 28, 1941 (74 y.o. Male) Treating [name] [name]: [name] [name], [name] [name], [name] [name], [place] Primary Care Physician: [name] [name] Other Clinician: Referring Physician: Apolonio [name] Treating Physician/Extender: [name] [name] in Treatment: 13 Constitutional . Pulse regular. Respirations normal and unlabored. Afebrile. . Eyes Nonicteric. Reactive to light. Ears, Nose, Mouth, and Throat Lips, teeth, and gums WNL.Marland Kitchen Moist mucosa without lesions . Neck supple and nontender. No palpable supraclavicular or cervical adenopathy. Normal sized without goiter. Respiratory WNL. No retractions.. Cardiovascular Pedal Pulses WNL. No clubbing, cyanosis or edema. Chest Breasts symmetical and no nipple discharge.. Breast tissue WNL, no masses, lumps, or tenderness.. Musculoskeletal Adexa without tenderness or enlargement.. Digits and nails  w/o clubbing, cyanosis, infection, petechiae, ischemia, or inflammatory conditions.. Integumentary (Hair, Skin) No suspicious lesions. No crepitus or fluctuance. No peri-wound warmth or erythema. No masses.Marland Kitchen Psychiatric Judgement and insight Intact.. No evidence of depression, anxiety, or agitation.. Notes The wound has minimal callus and debris which will be removed with the curette and then we will go ahead and apply the Epifix. The wound looks very clean and well epithelialized. Electronic Signature(s) Signed: 06/22/2015 9:35:55 AM By: [name] Fudge [name] [name], [name] [name] Entered By: [name] [name] on 06/22/2015 09:35:54 [name] [name] ([id number]) -------------------------------------------------------------------------------- Physician Orders Details Patient Name: [name] [name] Date of Service: 06/22/2015 8:45 AM Medical Record Number: [id number] Patient Account Number: [id number] Date of Birth/Sex: 1941/08/06 (74 y.o. Male) Treating [name] [name]: [name] [name], [name] [name], [name] [name], [name] [name] Primary Care Physician: [name] [name] Other Clinician: Referring Physician: Apolonio [name] Treating Physician/Extender: [name] [name] in Treatment: 59 Verbal / Phone Orders: Yes Clinician: Afful, [name] [name], [name] [name], [name] [name] Read Back and Verified: Yes Diagnosis Coding Primary Wound Dressing Wound #1 Left,Lateral Calcaneous o Other: - [name] [name] applied epifix 73mm Dressing Change Frequency Wound #1 Left,Lateral Calcaneous o Change dressing every week Follow-up Appointments Wound #1 Left,Lateral Calcaneous o Return Appointment in 1 week. Advanced Therapies Wound #1 Left,Lateral Calcaneous o EpiFix application in clinic; including contact layer, fixation with steri strips, dry gauze and cover dressing. Notes order 93mm epifix for next week Electronic Signature(s) Signed: 06/22/2015 9:28:31 AM By: Regan Lemming BSN, [name] [name] Signed: 06/22/2015 12:24:37 PM By: [name] Fudge [name] [name], [name] [name] Previous Signature: 06/22/2015 9:28:02 AM Version By: Regan Lemming BSN,  [name] [name] Entered By: Regan Lemming on 06/22/2015 09:28:31 [name] [name] ([id number]) -------------------------------------------------------------------------------- Problem List Details Patient Name: [name] [name] Date of Service: 06/22/2015 8:45 AM Medical Record Number: [id number] Patient Account Number: [id number] Date of Birth/Sex: 1941-03-31 (74 y.o. 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 Male) Treating [name] [name]: [name] [name], [name] [name], [name] [name], [place] Primary Care Physician: [name] [name] Other Clinician: Referring Physician: Apolonio [name] Treating Physician/Extender: [name] [name] in Treatment: 13 Subjective Chief Complaint Information obtained from Patient Patient presents to the wound care center for a consult due non healing wound pleasant 74 year old gentleman who comes with a ulcerated area on the left lateral heel and he's had it for about a month. History of Present Illness (HPI) 74 year old gentleman who comes as a self-referral and has been known to be a diabetic on treatment for the last 30 years and takes insulin for this. Developed a  callus on the left lateral heel and does not know exactly what type of footwear cause this. Did see his dermatologist but prescribed some medication for him and also applied some Bactroban cream. Able to soften this area and remove some of the dead skin but he now has a definite ulcer there and knowing he's been a diabetic he came as a self-referral for further treatment. He's been a nonsmoker all his life and hasn't had no problems with any arterial disease and has no workup done for this. 04/06/2015 -- he has been doing his dressing on alternate days but sometimes he runs that the dressing doesn't stay on for long. He also informs me that he is going to be out on vacation this weekend and will be back only in 2 weeks. 04/27/2015 -- his blood sugars under good control and he says he's been offloading as much as possible and has no pain whatsoever. 05/04/2015 -- he has been doing very well his blood sugars are well under control and his offloading and changing of dressing has been very compliant. 05/18/2015 - Due to some family issues he's not been seen back for 2 weeks. he ran out of his Prisma dressing and went and bought a duodenum type of dressing form the pharmacy and has used it for the last 48 hours. He started getting a minimal swelling in his leg and had noticed a change in his wound. 06/08/2015 -- he is back after 2 weeks because of some family commitments and says has been very compliant but is frustrated that his wound has not healed yet. 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 [name] [name], PILE. ([id number]) Objective Constitutional Pulse regular. Respirations normal and unlabored. Afebrile. Vitals Time Taken: 8:56 AM, Height: 71 in, Weight: 180 lbs, BMI: 25.1, Temperature: 98.5 F, Pulse: 56 bpm, Respiratory  Rate: 16 breaths/min, Blood Pressure: 110/57 mmHg. Eyes Nonicteric. Reactive to light. Ears, Nose, Mouth, and Throat Lips, teeth, and gums WNL.Marland Kitchen Moist mucosa without lesions . Neck supple and nontender. No palpable supraclavicular or cervical adenopathy. Normal sized without goiter. Respiratory WNL. No retractions.. Cardiovascular Pedal Pulses WNL. No clubbing, cyanosis or edema. Chest Breasts symmetical and  no nipple discharge.. Breast tissue WNL, no masses, lumps, or tenderness.. Musculoskeletal Adexa without tenderness or enlargement.. Digits and nails w/o clubbing, cyanosis, infection, petechiae, ischemia, or inflammatory conditions.Marland Kitchen Psychiatric Judgement and insight Intact.. No evidence of depression, anxiety, or agitation.. General Notes: The wound has minimal callus and debris which will be removed with the curette and then we will go ahead and apply the Epifix. The wound looks very clean and well epithelialized. Integumentary (Hair, Skin) No suspicious lesions. No crepitus or fluctuance. No peri-wound warmth or erythema. No masses.. Wound #1 status is Open. Original cause of wound was Gradually Appeared. The wound is located on the Left,Lateral Calcaneous. The wound measures 0.7cm length x 0.3cm width x 0.1cm depth; 0.165cm^2 area and 0.016cm^3 volume. The wound is limited to skin breakdown. There is a small amount of sanguinous drainage noted. The wound margin is flat and intact. There is large (67-100%) red, pink, friable granulation within the wound bed. There is no necrotic tissue within the wound bed. The periwound skin appearance exhibited: Callus, Moist. The periwound skin appearance did not exhibit: Crepitus, Excoriation, Fluctuance, Friable, Induration, Localized Edema, Rash, Scarring, Dry/Scaly, Maceration, Atrophie Blanche, Cyanosis, Ecchymosis, Hemosiderin Staining, Mottled, Pallor, Rubor, Erythema. Periwound temperature was noted as [name] [name], [place]. ([id number]) No  Abnormality. 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 Dressing changes have been discussed with him in great detail and he knows not to at this. He will see me back next week. Procedures Wound #1 Wound #1 is a Diabetic Wound/Ulcer of the Lower Extremity located on the Left,Lateral Calcaneous. A skin graft procedure using a bioengineered skin substitute/cellular or tissue based product was performed by [name] [name], [name] Latino., [name] [name]. Epifix was applied and secured with Steri-Strips. 2.5 sq cm of product was utilized and 0 sq cm was wasted. Post Application, mepitel was applied. A Time Out was conducted prior to the start of the procedure. The procedure was tolerated well with a pain level of 0 throughout and a pain level of 0 following the procedure. Plan Primary Wound Dressing: Wound #1 Left,Lateral Calcaneous: Other: - [name] [name] applied epifix 36mm Dressing Change Frequency: Wound #1 Left,Lateral Calcaneous: Change dressing every week Follow-up Appointments: Wound #1 Left,Lateral Calcaneous: Return Appointment in 1 week. Advanced Therapies: [name] [name], [name] [name] ([id number]) Wound #1 Left,Lateral Calcaneous: EpiFix application in clinic; including contact layer, fixation with steri strips, dry gauze and cover dressing. General Notes: order 75mm epifix for next week The usual precautions and the Epifix #1 was applied and this is going to be in situ for a week. Dressing changes have been discussed with him in great detail and he knows not to at this. He will see me back next week. Electronic Signature(s) Signed: 06/22/2015 9:37:04 AM By: [name] Fudge [name] [name], [name] [name] Entered By: [name] [name] on 06/22/2015 09:37:04 [name] [name]  ([id number]) -------------------------------------------------------------------------------- SuperBill Details Patient Name: [name] [name] Date of Service: 06/22/2015 Medical Record Number: [id number] Patient Account Number: [id number] Date of Birth/Sex: 03-13-1941 (74 y.o. 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  to breakdown of skin Facility Procedures CPT4: Description Modifier Quantity Code [id number] (Facility Use Only) [phone] o Epifix o Per 1 SQ CM 2.5 ICD-10 Description Diagnosis E11.621 Type 2 diabetes mellitus with foot ulcer 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 to  breakdown of skin CPT4: [id number] - SKIN SUB GRAFT FACE/NK/HF/G 1 ICD-10 Description Diagnosis E11.621 Type 2 diabetes mellitus with 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

## 2015-06-22 NOTE — Progress Notes (Addendum)
[name] [name], [name] [name] ([id number]) Visit Report for 06/22/2015 Arrival Information Details Patient Name: [name] [name], [name] [name] Date of Service: 06/22/2015 8:45 AM Medical Record Number: [id number] Patient Account Number: [id number] Date of Birth/Sex: 1941-09-16 (73 y.o. Male) Treating RN: Baruch Gouty, RN, BSN, Velva Harman Primary Care Physician: Apolonio Schneiders Other Clinician: Referring Physician: Apolonio Schneiders Treating Physician/Extender: Frann Rider in Treatment: 13 Visit Information History Since Last Visit Any new allergies or adverse reactions: No Patient Arrived: [name] Had a fall or experienced change in No Arrival Time: 08:53 activities of daily living that may affect Accompanied By: self risk of falls: Transfer Assistance: None Signs or symptoms of abuse/neglect since last No Patient Identification Verified: Yes visito Secondary Verification Process Completed: Yes Hospitalized since last visit: No Patient Has Alerts: Yes Has Dressing in Place as Prescribed: Yes Has Compression in Place as Prescribed: Yes Pain Present Now: No Electronic Signature(s) Signed: 06/22/2015 8:54:33 AM By: Regan Lemming BSN, RN Entered By: Regan Lemming on 06/22/2015 08:54:33 [name] [name] ([id number]) -------------------------------------------------------------------------------- Encounter Discharge Information Details Patient Name: [name] [name] Date of Service: 06/22/2015 8:45 AM Medical Record Number: [id number] Patient Account Number: [id number] Date of Birth/Sex: Apr 23, 1941 (74 y.o. Male) Treating RN: Baruch Gouty, RN, BSN, [place] Primary Care Physician: Apolonio Schneiders Other Clinician: Referring Physician: Apolonio Schneiders Treating Physician/Extender: Frann Rider in Treatment: 39 Encounter Discharge Information Items Discharge Pain Level: 0 Discharge Condition: Stable Ambulatory Status: Cane Discharge Destination: Home Private Transportation: Auto Accompanied By: self Schedule Follow-up Appointment:  No Medication Reconciliation completed and No provided to Patient/Care [name] [name]: Clinical Summary of Care: Electronic Signature(s) Signed: 06/22/2015 9:29:59 AM By: Regan Lemming BSN, RN Entered By: Regan Lemming on 06/22/2015 09:29:58 [name] [name] ([id number]) -------------------------------------------------------------------------------- Lower Extremity Assessment Details Patient Name: [name] [name] Date of Service: 06/22/2015 8:45 AM Medical Record Number: [id number] Patient Account Number: [id number] Date of Birth/Sex: September 24, 1941 (74 y.o. Male) Treating RN: Baruch Gouty, RN, BSN, [place] Primary Care Physician: Apolonio Schneiders Other Clinician: Referring Physician: Apolonio Schneiders Treating Physician/Extender: Frann Rider in Treatment: 13 Edema Assessment Assessed: [Left: No] [Right: No] Edema: [Left: N] [Right: o] Vascular Assessment Pulses: Posterior Tibial Dorsalis Pedis Palpable: [Left:Yes] Extremity colors, hair growth, and conditions: Extremity Color: [Left:Normal] Hair Growth on Extremity: [Left:Yes] Temperature of Extremity: [Left:Warm] Capillary Refill: [Left:< 3 seconds] Dependent Rubor: [Left:No] Blanched when Elevated: [Left:No] Lipodermatosclerosis: [Left:No] Toe Nail Assessment Left: Right: Thick: No Discolored: No Deformed: No Improper Length and Hygiene: No Electronic Signature(s) Signed: 06/22/2015 8:57:21 AM By: Regan Lemming BSN, RN Entered By: Regan Lemming on 06/22/2015 08:57:20 [name] [name] ([id number]) -------------------------------------------------------------------------------- Multi Wound Chart Details Patient Name: [name] [name] Date of Service: 06/22/2015 8:45 AM Medical Record Number: [id number] Patient Account Number: [id number] Date of Birth/Sex: 1941-09-12 (74 y.o. Male) Treating RN: Baruch Gouty, RN, BSN, [place] Primary Care Physician: Apolonio Schneiders Other Clinician: Referring Physician: Apolonio Schneiders Treating Physician/Extender: Frann Rider  in Treatment: 13 Vital Signs Height(in): 71 Pulse(bpm): 56 Weight(lbs): 180 Blood Pressure 110/57 (mmHg): Body Mass Index(BMI): 25 Temperature(F): 98.5 Respiratory Rate 16 (breaths/min): Photos: [1:No Photos] [N/A:N/A] Wound Location: [1:Left Calcaneous - Lateral] [N/A:N/A] Wounding Event: [1:Gradually Appeared] [N/A:N/A] Primary Etiology: [1:Diabetic Wound/Ulcer of the Lower Extremity] [N/A:N/A] Comorbid History: [1:Type II Diabetes] [N/A:N/A] Date Acquired: [1:02/12/2015] [N/A:N/A] Weeks of Treatment: [1:13] [N/A:N/A] Wound Status: [1:Open] [N/A:N/A] Measurements L x W x D 0.7x0.3x0.1 [N/A:N/A] (cm) Area (cm) : [1:0.165] [N/A:N/A] Volume (cm) : [1:0.016] [N/A:N/A] % Reduction in Area: [1:81.30%] [N/A:N/A] % Reduction in Volume: 81.80% [N/A:N/A] Classification: [1:Grade 1] [N/A:N/A] Exudate Amount: [1:Small] [N/A:N/A]  Exudate Type: [1:Sanguinous] [N/A:N/A] Exudate Color: [1:red] [N/A:N/A] Wound Margin: [1:Flat and Intact] [N/A:N/A] Granulation Amount: [1:Large (67-100%)] [N/A:N/A] Granulation Quality: [1:Red, Pink, Friable] [N/A:N/A] Necrotic Amount: [1:None Present (0%)] [N/A:N/A] Exposed Structures: [1:Fascia: No Fat: No Tendon: No Muscle: No Joint: No Bone: No] [N/A:N/A] Limited to Skin Breakdown Epithelialization: Large (67-100%) N/A N/A Periwound Skin Texture: Callus: Yes N/A N/A Edema: No Excoriation: No Induration: No Crepitus: No Fluctuance: No Friable: No Rash: No Scarring: No Periwound Skin Moist: Yes N/A N/A Moisture: Maceration: No Dry/Scaly: No Periwound Skin Color: Atrophie Blanche: No N/A N/A Cyanosis: No Ecchymosis: No Erythema: No Hemosiderin Staining: No Mottled: No Pallor: No Rubor: No Temperature: No Abnormality N/A N/A Tenderness on No N/A N/A Palpation: Wound Preparation: Ulcer Cleansing: N/A N/A Rinsed/Irrigated with Saline Topical Anesthetic Applied: None Treatment Notes Electronic Signature(s) Signed: 06/22/2015  8:59:51 AM By: Regan Lemming BSN, RN Entered By: Regan Lemming on 06/22/2015 08:59:51 [name] [name] ([id number]) -------------------------------------------------------------------------------- [place] Details Patient Name: [name] [name] Date of Service: 06/22/2015 8:45 AM Medical Record Number: [id number] Patient Account Number: [id number] Date of Birth/Sex: 1941-07-09 (74 y.o. Male) Treating RN: Baruch Gouty, RN, BSN, [place] Primary Care Physician: Apolonio Schneiders Other Clinician: Referring Physician: Apolonio Schneiders Treating Physician/Extender: Frann Rider in Treatment: 13 Active Inactive Orientation to the Wound Care Program Nursing Diagnoses: Knowledge deficit related to the wound healing center program Goals: Patient/caregiver will verbalize understanding of the [facility] Program Date Initiated: 03/23/2015 Goal Status: Active Interventions: Provide education on orientation to the wound center Notes: Wound/Skin Impairment Nursing Diagnoses: Knowledge deficit related to ulceration/compromised skin integrity Goals: Patient/caregiver will verbalize understanding of skin care regimen Date Initiated: 03/23/2015 Goal Status: Active Ulcer/skin breakdown will heal within 14 weeks Date Initiated: 03/23/2015 Goal Status: Active Interventions: Assess patient/caregiver ability to obtain necessary supplies Assess patient/caregiver ability to perform ulcer/skin care regimen upon admission and as needed Assess ulceration(s) every visit Provide education on ulcer and skin care Treatment Activities: Skin care regimen initiated : 06/22/2015 Topical wound management initiated : 06/22/2015 [name] [name], [name] [name] ([id number]) Notes: Electronic Signature(s) Signed: 06/22/2015 8:59:42 AM By: Regan Lemming BSN, RN Entered By: Regan Lemming on 06/22/2015 08:59:42 [name] [name] ([id number]) -------------------------------------------------------------------------------- Pain  Assessment Details Patient Name: [name] [name] Date of Service: 06/22/2015 8:45 AM Medical Record Number: [id number] Patient Account Number: [id number] Date of Birth/Sex: 07/06/1941 (74 y.o. Male) Treating RN: Baruch Gouty, RN, BSN, [place] Primary Care Physician: Apolonio Schneiders Other Clinician: Referring Physician: Apolonio Schneiders Treating Physician/Extender: Frann Rider in Treatment: 13 Active Problems Location of Pain Severity and Description of Pain Patient Has Paino No Site Locations Pain Management and Medication Current Pain Management: Electronic Signature(s) Signed: 06/22/2015 8:56:24 AM By: Regan Lemming BSN, RN Entered By: Regan Lemming on 06/22/2015 31:54:00 [name] [name] ([id number]) -------------------------------------------------------------------------------- Patient/Caregiver Education Details Patient Name: [name] [name] Date of Service: 06/22/2015 8:45 AM Medical Record Number: [id number] Patient Account Number: [id number] Date of Birth/Gender: 11-09-1941 (74 y.o. Male) Treating RN: Baruch Gouty, RN, BSN, [place] Primary Care Physician: Apolonio Schneiders Other Clinician: Referring Physician: Apolonio Schneiders Treating Physician/Extender: Frann Rider in Treatment: 13 Education Assessment Education Provided To: Patient Education Topics Provided Basic Hygiene: Methods: Explain/Verbal Responses: State content correctly Welcome To [facility]: Methods: Explain/Verbal Responses: State content correctly Wound/Skin Impairment: Handouts: Other: keep primary dressing on until next wcc visit Methods: Explain/Verbal Responses: State content correctly Electronic Signature(s) Signed: 06/22/2015 9:30:38 AM By: Regan Lemming BSN, RN Entered By: Regan Lemming on 06/22/2015 09:30:38 [name] [name] ([id number]) --------------------------------------------------------------------------------  Wound Assessment Details Patient Name: [name] [name], [name] [name] Date of Service: 06/22/2015 8:45  AM Medical Record Number: [id number] Patient Account Number: [id number] Date of Birth/Sex: 08-30-1941 (74 y.o. Male) Treating RN: Baruch Gouty, RN, BSN, [place] Primary Care Physician: Apolonio Schneiders Other Clinician: Referring Physician: Apolonio Schneiders Treating Physician/Extender: Frann Rider in Treatment: 13 Wound Status Wound Number: 1 Primary Diabetic Wound/Ulcer of the Lower Etiology: Extremity Wound Location: Left Calcaneous - Lateral Wound Status: Open Wounding Event: Gradually Appeared Comorbid Type II Diabetes Date Acquired: 02/12/2015 History: Weeks Of Treatment: 13 Clustered Wound: No Wound Measurements Length: (cm) 0.7 Width: (cm) 0.3 Depth: (cm) 0.1 Area: (cm) 0.165 Volume: (cm) 0.016 % Reduction in Area: 81.3% % Reduction in Volume: 81.8% Epithelialization: Large (67-100%) Wound Description Classification: Grade 1 Wound Margin: Flat and Intact Exudate Amount: Small Exudate Type: Sanguinous Exudate Color: red Foul Odor After Cleansing: No Wound Bed Granulation Amount: Large (67-100%) Exposed Structure Granulation Quality: Red, Pink, Friable Fascia Exposed: No Necrotic Amount: None Present (0%) Fat Layer Exposed: No Tendon Exposed: No Muscle Exposed: No Joint Exposed: No Bone Exposed: No Limited to Skin Breakdown Periwound Skin Texture Texture Color No Abnormalities Noted: No No Abnormalities Noted: No Callus: Yes Atrophie Blanche: No Crepitus: No Cyanosis: No Excoriation: No Ecchymosis: No Fluctuance: No Erythema: No [name] [name], MINIX. ([id number]) Friable: No Hemosiderin Staining: No Induration: No Mottled: No Localized Edema: No Pallor: No Rash: No Rubor: No Scarring: No Temperature / Pain Moisture Temperature: No Abnormality No Abnormalities Noted: No Dry / Scaly: No Maceration: No Moist: Yes Wound Preparation Ulcer Cleansing: Rinsed/Irrigated with Saline Topical Anesthetic Applied: None Treatment Notes Wound #1 (Left, Lateral  Calcaneous) 4. Dressing Applied: Other dressing (specify in notes) 5. Secondary Dressing Applied Bordered Foam Dressing Notes Epifix 10mm applied by MD. Carlene Coria shoe with peg assist Electronic Signature(s) Signed: 06/22/2015 8:59:12 AM By: Regan Lemming BSN, RN Entered By: Regan Lemming on 06/22/2015 08:59:12 [name] [name] ([id number]) -------------------------------------------------------------------------------- Vitals Details Patient Name: [name] [name] Date of Service: 06/22/2015 8:45 AM Medical Record Number: [id number] Patient Account Number: [id number] Date of Birth/Sex: 1941/07/02 (74 y.o. Male) Treating RN: [place], RN, BSN, [place] Primary Care Physician: Apolonio Schneiders Other Clinician: Referring Physician: Apolonio Schneiders Treating Physician/Extender: Frann Rider in Treatment: 13 Vital Signs Time Taken: 08:56 Temperature (F): 98.5 Height (in): 71 Pulse (bpm): 56 Weight (lbs): 180 Respiratory Rate (breaths/min): 16 Body Mass Index (BMI): 25.1 Blood Pressure (mmHg): 110/57 Reference Range: 80 - 120 mg / dl Electronic Signature(s) Signed: 06/22/2015 8:56:47 AM By: Regan Lemming BSN, RN Entered By: Regan Lemming on 06/22/2015 08:56:47

## 2015-06-29 ENCOUNTER — Encounter: Payer: Medicare Other | Admitting: Surgery

## 2015-06-29 DIAGNOSIS — L97421 Non-pressure chronic ulcer of left heel and midfoot limited to breakdown of skin: Secondary | ICD-10-CM | POA: Diagnosis not present

## 2015-06-29 NOTE — Progress Notes (Signed)
[name] [name], [name] [name] ([id number]) Visit Report for 06/29/2015 Chief Complaint Document Details Patient Name: [name] [name], [name] [name] Date of Service: 06/29/2015 8:45 AM Medical Record Number: [id number] Patient Account Number: [id number] Date of Birth/Sex: 1941-08-25 (74 y.o. Male) Treating RN: Baruch Gouty, RN, BSN, Velva Harman Primary Care Physician: Apolonio Schneiders Other Clinician: Referring Physician: Apolonio Schneiders Treating Physician/Extender: Frann Rider in Treatment: 14 Information Obtained from: Patient Chief Complaint Patient presents to the wound care center for a consult due non healing wound pleasant 74 year old gentleman who comes with a ulcerated area on the left lateral heel and he's had it for about a month. Electronic Signature(s) Signed: 06/29/2015 9:39:36 AM By: Christin Fudge MD, FACS Entered By: Christin Fudge on 06/29/2015 09:39:36 [name] [name] ([id number]) -------------------------------------------------------------------------------- Debridement Details Patient Name: [name] [name] Date of Service: 06/29/2015 8:45 AM Medical Record Number: [id number] Patient Account Number: [id number] Date of Birth/Sex: [birth year] (74 y.o. Male) Treating RN: Baruch Gouty, RN, BSN, Velva Harman Primary Care Physician: Apolonio Schneiders Other Clinician: Referring Physician: Apolonio Schneiders Treating Physician/Extender: Frann Rider in Treatment: 14 Debridement Performed for Wound #1 Left,Lateral Calcaneous Assessment: Performed By: Physician Pat Patrick., MD Debridement: Open Wound/Selective Debridement Selective Description: Pre-procedure Yes Verification/Time Out Taken: Start Time: 09:22 Pain Control: Other : lidocaine 4% Level: Non-Viable Tissue Total Area Debrided (L x 0.8 (cm) x 2 (cm) = 1.6 (cm) W): Tissue and other Non-Viable, Eschar, Exudate material debrided: Instrument: Forceps Bleeding: Minimum Hemostasis Achieved: Pressure End Time: 09:25 Procedural Pain: 0 Post Procedural Pain:  0 Response to Treatment: Procedure was tolerated well Post Debridement Measurements of Total Wound Length: (cm) 0.8 Width: (cm) 2 Depth: (cm) 0.1 Volume: (cm) 0.126 Electronic Signature(s) Signed: 06/29/2015 9:39:30 AM By: Christin Fudge MD, FACS Signed: 06/29/2015 3:00:11 PM By: Regan Lemming BSN, RN Entered By: Christin Fudge on 06/29/2015 09:39:29 [name] [name] ([id number]) -------------------------------------------------------------------------------- HPI Details Patient Name: [name] [name] Date of Service: 06/29/2015 8:45 AM Medical Record Number: [id number] Patient Account Number: [id number] Date of Birth/Sex: 08-Oct-1941 (74 y.o. Male) Treating RN: Baruch Gouty, RN, BSN, [place] Primary Care Physician: Apolonio Schneiders Other Clinician: Referring Physician: Apolonio Schneiders Treating Physician/Extender: Frann Rider in Treatment: 14 History of Present Illness HPI Description: 74 year old gentleman who comes as a self-referral and has been known to be a diabetic on treatment for the last 30 years and takes insulin for this. Developed a callus on the left lateral heel and does not know exactly what type of footwear cause this. Did see his dermatologist but prescribed some medication for him and also applied some Bactroban cream. Able to soften this area and remove some of the dead skin but he now has a definite ulcer there and knowing he's been a diabetic he came as a self-referral for further treatment. He's been a nonsmoker all his life and hasn't had no problems with any arterial disease and has no workup done for this. 04/06/2015 -- he has been doing his dressing on alternate days but sometimes he runs that the dressing doesn't stay on for long. He also informs me that he is going to be out on vacation this weekend and will be back only in 2 weeks. 04/27/2015 -- his blood sugars under good control and he says he's been offloading as much as possible and has no pain  whatsoever. 05/04/2015 -- he has been doing very well his blood sugars are well under control and his offloading and changing of dressing has been very compliant. 05/18/2015 - Due to some family issues he's not been  seen back for 2 weeks. he ran out of his Prisma dressing and went and bought a duodenum type of dressing form the pharmacy and has used it for the last 48 hours. He started getting a minimal swelling in his leg and had noticed a change in his wound. 06/08/2015 -- he is back after 2 weeks because of some family commitments and says has been very compliant but is frustrated that his wound has not healed yet. 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 06/29/2015 -- he is here for a wound check after his first application of Epifix Last week. he is doing fine and has no complaints. Electronic Signature(s) Signed: 06/29/2015 9:40:22 AM By: Christin Fudge MD, FACS Entered By: Christin Fudge on 06/29/2015 09:40:21 [name] [name] ([id number]) -------------------------------------------------------------------------------- Physical Exam Details Patient Name: [name] [name] Date of Service: 06/29/2015 8:45 AM Medical Record Number: [id number] Patient Account Number: [id number] Date of Birth/Sex: Jun 07, 1941 (74 y.o. Male) Treating RN: Baruch Gouty, RN, BSN, Velva Harman Primary Care Physician: Apolonio Schneiders Other Clinician: Referring Physician: Apolonio Schneiders Treating Physician/Extender: Frann Rider in Treatment: 14 Constitutional . Pulse regular. Respirations normal and unlabored. Afebrile. . Eyes Nonicteric. Reactive to light. Ears, Nose, Mouth, and Throat Lips, teeth, and gums WNL.Marland Kitchen Moist mucosa without lesions . Neck supple and nontender. No palpable supraclavicular or cervical adenopathy. Normal sized without  goiter. Respiratory WNL. No retractions.. Cardiovascular Pedal Pulses WNL. No clubbing, cyanosis or edema. Chest Breasts symmetical and no nipple discharge.. Breast tissue WNL, no masses, lumps, or tenderness.. Gastrointestinal (GI) Abdomen without masses or tenderness.. No liver or spleen enlargement or tenderness.. Genitourinary (GU) No hydrocele, spermatocele, tenderness of the cord, or testicular mass.Marland Kitchen Penis without lesions.Lowella Fairy without lesions. No cystocele, or rectocele. Pelvic support intact, no discharge. Marland Kitchen Urethra without masses, tenderness or scarring.Marland Kitchen Lymphatic No adneopathy. No adenopathy. No adenopathy. Musculoskeletal Adexa without tenderness or enlargement.. Digits and nails w/o clubbing, cyanosis, infection, petechiae, ischemia, or inflammatory conditions.. Integumentary (Hair, Skin) No suspicious lesions. No crepitus or fluctuance. No peri-wound warmth or erythema. No masses.Marland Kitchen Psychiatric Judgement and insight Intact.. No evidence of depression, anxiety, or agitation.. Notes The wound looks good there is good amount of epithelialization and some debris at the sides which will be sharply removed. Electronic Signature(s) [name] [name], [name] [name] ([id number]) Signed: 06/29/2015 9:40:59 AM By: Christin Fudge MD, FACS Entered By: Christin Fudge on 06/29/2015 09:40:59 [name] [name] ([id number]) -------------------------------------------------------------------------------- Physician Orders Details Patient Name: [name] [name] Date of Service: 06/29/2015 8:45 AM Medical Record Number: [id number] Patient Account Number: [id number] Date of Birth/Sex: 1941-02-20 (74 y.o. Male) Treating RN: Cornell Barman Primary Care Physician: Apolonio Schneiders Other Clinician: Referring Physician: Apolonio Schneiders Treating Physician/Extender: Frann Rider in Treatment: 14 Verbal / Phone Orders: Yes Clinician: Cornell Barman Read Back and Verified: Yes Diagnosis Coding Wound Cleansing Wound  #1 Left,Lateral Calcaneous o Clean wound with Normal Saline. Anesthetic Wound #1 Left,Lateral Calcaneous o Topical Lidocaine 4% cream applied to wound bed prior to debridement Primary Wound Dressing o Other: - mepitel one secured with streristrips Secondary Dressing Wound #1 Left,Lateral Calcaneous o Boardered Foam Dressing o Dry Gauze Dressing Change Frequency Wound #1 Left,Lateral Calcaneous o Change dressing every week Follow-up Appointments Wound #1 Left,Lateral Calcaneous o Return Appointment in 1 week. Off-Loading Wound #1 Left,Lateral Calcaneous o Open toe surgical shoe  with peg assist. Notes Epifix 64mm in office for next visit. Electronic Signature(s) Signed: 06/29/2015 12:11:50 PM By: Christin Fudge MD, FACS Signed: 06/29/2015 2:14:46 PM By: Gretta Cool RN, BSN, Kim RN, BSN Entered By: Gretta Cool, RN, BSN, Kim on 06/29/2015 06:26:94 [name] [name] ([id number]) [name] [name], [name] [name] ([id number]) -------------------------------------------------------------------------------- Problem List Details Patient Name: [name] [name] Date of Service: 06/29/2015 8:45 AM Medical Record Number: [id number] Patient Account Number: [id number] Date of Birth/Sex: 28-Feb-1941 (74 y.o. 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 Male) Treating RN: Baruch Gouty, RN, BSN, [place] Primary Care Physician: Apolonio Schneiders Other Clinician: Referring Physician: Apolonio Schneiders Treating Physician/Extender: Frann Rider in Treatment: 14 Subjective Chief Complaint Information obtained from Patient Patient presents to the wound care center for a consult due non healing wound pleasant 74 year old gentleman who comes with a ulcerated area on the left lateral heel and he's had it for about a month. History of Present Illness (HPI) 74 year old gentleman who comes as a self-referral and has been known to be a diabetic on treatment for the last 30 years and takes insulin for this. Developed a callus on the left lateral heel and does not know exactly what type of footwear cause this. Did see his dermatologist but prescribed some medication for him and also applied some Bactroban cream. Able to soften this area and remove some of the dead skin but he now has a definite ulcer there and knowing he's been a diabetic he came as a self-referral for further treatment. He's been a nonsmoker all his life and hasn't had no problems with any arterial disease and has no workup done for this. 04/06/2015 -- he has been doing his dressing on alternate days but sometimes he runs that the dressing doesn't stay on for long. He also informs me that he is going to be out on vacation this weekend and will be back only in 2 weeks. 04/27/2015 -- his blood sugars under good control and he says he's been offloading as much as possible and has no pain whatsoever. 05/04/2015 -- he has been doing very well his blood sugars are well under control and his offloading and changing of  dressing has been very compliant. 05/18/2015 - Due to some family issues he's not been seen back for 2 weeks. he ran out of his Prisma dressing and went and bought a duodenum type of dressing form the pharmacy and has used it for the last 48 hours. He started getting a minimal swelling in his leg and had noticed a change in his wound. 06/08/2015 -- he is back after 2 weeks because of some family commitments and says has been very compliant but is frustrated that his wound has not healed yet. On discussing the use of a total contact cast for his legs he says he has a severe problem psychologically with this and is unable to  tolerate anything and has anxiety and claustrophobia if his leg is in a cast. 06/29/2015 -- he is here for a wound check after his first application of Epifix Last week. he is doing fine and has no complaints. [name] [name], [name] [name] ([id number]) Objective Constitutional Pulse regular. Respirations normal and unlabored. Afebrile. Vitals Time Taken: 9:15 AM, Height: 71 in, Weight: 180 lbs, BMI: 25.1, Temperature: 97.5 F, Pulse: 49 bpm, Respiratory Rate: 16 breaths/min, Blood Pressure: 112/65 mmHg. Eyes Nonicteric. Reactive to light. Ears, Nose, Mouth, and Throat Lips, teeth, and gums WNL.Marland Kitchen Moist mucosa without lesions . Neck supple and nontender. No palpable supraclavicular or cervical adenopathy. Normal sized without goiter. Respiratory WNL. No retractions.. Cardiovascular Pedal Pulses WNL. No clubbing, cyanosis or edema. Chest Breasts symmetical and no nipple discharge.. Breast tissue WNL, no masses, lumps, or tenderness.. Gastrointestinal (GI) Abdomen without masses or tenderness.. No liver or spleen enlargement or tenderness.. Genitourinary (GU) No hydrocele, spermatocele, tenderness of the cord, or testicular mass.Marland Kitchen Penis without lesions.Lowella Fairy without lesions. No cystocele, or rectocele. Pelvic support intact, no discharge. Marland Kitchen Urethra without masses, tenderness  or scarring.Marland Kitchen Lymphatic No adneopathy. No adenopathy. No adenopathy. Musculoskeletal Adexa without tenderness or enlargement.. Digits and nails w/o clubbing, cyanosis, infection, petechiae, ischemia, or inflammatory conditions.Marland Kitchen Psychiatric Judgement and insight Intact.. No evidence of depression, anxiety, or agitation.. General Notes: The wound looks good there is good amount of epithelialization and some debris at the TAIJON, [name] [name]. ([id number]) sides which will be sharply removed. Integumentary (Hair, Skin) No suspicious lesions. No crepitus or fluctuance. No peri-wound warmth or erythema. No masses.. Wound #1 status is Open. Original cause of wound was Gradually Appeared. The wound is located on the Left,Lateral Calcaneous. The wound measures 0.8cm length x 2cm width x 0.1cm depth; 1.257cm^2 area and 0.126cm^3 volume. The wound is limited to skin breakdown. There is no tunneling or undermining noted. There is a small amount of sanguinous drainage noted. The wound margin is flat and intact. There is large (67-100%) red, pink, friable granulation within the wound bed. There is no necrotic tissue within the wound bed. The periwound skin appearance exhibited: Maceration, Moist. The periwound skin appearance did not exhibit: Callus, Crepitus, Excoriation, Fluctuance, Friable, Induration, Localized Edema, Rash, Scarring, Dry/Scaly, Atrophie Blanche, Cyanosis, Ecchymosis, Hemosiderin Staining, Mottled, Pallor, Rubor, Erythema. Periwound temperature was noted as No Abnormality. 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 Will continue with offloading and see him back next week for his  second application of a Epifix. Procedures Wound #1 Wound #1 is a Diabetic Wound/Ulcer of the Lower Extremity located on the Left,Lateral Calcaneous . There was a Non-Viable Tissue Open Wound/Selective [phone]) debridement with total area of 1.6 sq cm performed by Briana Farner, Jackson Latino., MD. with the following instrument(s): Forceps to remove Non-Viable tissue/material including Exudate and Eschar after achieving pain control using Other (lidocaine 4%). A time out was conducted prior to the start of the procedure. A Minimum amount of bleeding was controlled with Pressure. The procedure was tolerated well with a pain level of 0 throughout and a pain level of 0 following the procedure. Post Debridement Measurements: 0.8cm length x 2cm width x 0.1cm depth; 0.126cm^3 volume. [name] [name], [name] [name] ([id number]) Plan Wound Cleansing: Wound #  1 Left,Lateral Calcaneous: Clean wound with Normal Saline. Anesthetic: Wound #1 Left,Lateral Calcaneous: Topical Lidocaine 4% cream applied to wound bed prior to debridement Primary Wound Dressing: Other: - mepitel one secured with streristrips Secondary Dressing: Wound #1 Left,Lateral Calcaneous: Boardered Foam Dressing Dry Gauze Dressing Change Frequency: Wound #1 Left,Lateral Calcaneous: Change dressing every week Follow-up Appointments: Wound #1 Left,Lateral Calcaneous: Return Appointment in 1 week. Off-Loading: Wound #1 Left,Lateral Calcaneous: Open toe surgical shoe with peg assist. General Notes: Epifix 82mm in office for next visit. The wound looks very good with significant epithelization and minimal debris which after cleaning out will need a application of Mepitel and a dressing to keep this in place. Will continue with offloading and see him back next week for his second application of a Epifix. Electronic Signature(s) Signed: 06/29/2015 9:42:18 AM By: Christin Fudge MD, FACS Entered By: Christin Fudge on 06/29/2015 09:42:18 [name] [name]  ([id number]) -------------------------------------------------------------------------------- SuperBill Details Patient Name: [name] [name] Date of Service: 06/29/2015 Medical Record Number: [id number] Patient Account Number: [id number] Date of Birth/Sex: [birth year] (74 y.o. 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

## 2015-06-29 NOTE — Progress Notes (Addendum)
[name] [name] ([id number]) Visit Report for 06/29/2015 Arrival Information Details Patient Name: [name] [name], [name] [name] Date of Service: 06/29/2015 8:45 AM Medical Record Number: [id number] Patient Account Number: [id number] Date of Birth/Sex: [birth year] (74 y.o. Male) Treating RN: Baruch Gouty, RN, BSN, Velva Harman Primary Care Physician: Apolonio Schneiders Other Clinician: Referring Physician: Apolonio Schneiders Treating Physician/Extender: Frann Rider in Treatment: 14 Visit Information History Since Last Visit Any new allergies or adverse reactions: No Patient Arrived: Ambulatory Had a fall or experienced change in No Arrival Time: 09:12 activities of daily living that may affect Accompanied By: self risk of falls: Transfer Assistance: None Signs or symptoms of abuse/neglect since last No Patient Identification Verified: Yes visito Secondary Verification Process Yes Has Dressing in Place as Prescribed: Yes Completed: Pain Present Now: No Patient Has Alerts: Yes Electronic Signature(s) Signed: 06/29/2015 9:12:57 AM By: Regan Lemming BSN, RN Entered By: Regan Lemming on 06/29/2015 09:12:57 [name] [name] ([id number]) -------------------------------------------------------------------------------- Encounter Discharge Information Details Patient Name: [name] [name] Date of Service: 06/29/2015 8:45 AM Medical Record Number: [id number] Patient Account Number: [id number] Date of Birth/Sex: October 26, 1941 (74 y.o. Male) Treating RN: Baruch Gouty, RN, BSN, [place] Primary Care Physician: Apolonio Schneiders Other Clinician: Referring Physician: Apolonio Schneiders Treating Physician/Extender: Frann Rider in Treatment: 14 Encounter Discharge Information Items Discharge Pain Level: 0 Discharge Condition: Stable Ambulatory Status: Ambulatory Discharge Destination: Home Private Transportation: Auto Accompanied By: self Schedule Follow-up Appointment: No Medication Reconciliation completed and No provided to Patient/Care  [name] [name]: Clinical Summary of Care: Electronic Signature(s) Signed: 06/29/2015 3:00:11 PM By: Regan Lemming BSN, RN Entered By: Regan Lemming on 06/29/2015 09:33:49 [name] [name] ([id number]) -------------------------------------------------------------------------------- Lower Extremity Assessment Details Patient Name: [name] [name] Date of Service: 06/29/2015 8:45 AM Medical Record Number: [id number] Patient Account Number: [id number] Date of Birth/Sex: Mar 30, 1941 (74 y.o. Male) Treating RN: Baruch Gouty, RN, BSN, Velva Harman Primary Care Physician: Apolonio Schneiders Other Clinician: Referring Physician: Apolonio Schneiders Treating Physician/Extender: Frann Rider in Treatment: 14 Vascular Assessment Pulses: Posterior Tibial Dorsalis Pedis Palpable: [Left:Yes] Extremity colors, hair growth, and conditions: Extremity Color: [Left:Normal] Hair Growth on Extremity: [Left:Yes] Temperature of Extremity: [Left:Warm] Capillary Refill: [Left:< 3 seconds] Toe Nail Assessment Left: Right: Thick: No Discolored: No Deformed: No Improper Length and Hygiene: No Electronic Signature(s) Signed: 06/29/2015 9:19:08 AM By: Regan Lemming BSN, RN Entered By: Regan Lemming on 06/29/2015 09:19:07 [name] [name] ([id number]) -------------------------------------------------------------------------------- Multi Wound Chart Details Patient Name: [name] [name] Date of Service: 06/29/2015 8:45 AM Medical Record Number: [id number] Patient Account Number: [id number] Date of Birth/Sex: 08-19-1941 (74 y.o. Male) Treating RN: Cornell Barman Primary Care Physician: Apolonio Schneiders Other Clinician: Referring Physician: Apolonio Schneiders Treating Physician/Extender: Frann Rider in Treatment: 14 Vital Signs Height(in): 71 Pulse(bpm): 49 Weight(lbs): 180 Blood Pressure 112/65 (mmHg): Body Mass Index(BMI): 25 Temperature(F): 97.5 Respiratory Rate 16 (breaths/min): Photos: [1:No Photos] [N/A:N/A] Wound Location: [1:Left  Calcaneous - Lateral] [N/A:N/A] Wounding Event: [1:Gradually Appeared] [N/A:N/A] Primary Etiology: [1:Diabetic Wound/Ulcer of the Lower Extremity] [N/A:N/A] Comorbid History: [1:Type II Diabetes] [N/A:N/A] Date Acquired: [1:02/12/2015] [N/A:N/A] Weeks of Treatment: [1:14] [N/A:N/A] Wound Status: [1:Open] [N/A:N/A] Measurements L x W x D 0.8x2x0.1 [N/A:N/A] (cm) Area (cm) : [1:1.257] [N/A:N/A] Volume (cm) : [1:0.126] [N/A:N/A] % Reduction in Area: [1:-42.80%] [N/A:N/A] % Reduction in Volume: -43.20% [N/A:N/A] Classification: [1:Grade 1] [N/A:N/A] Exudate Amount: [1:Small] [N/A:N/A] Exudate Type: [1:Sanguinous] [N/A:N/A] Exudate Color: [1:red] [N/A:N/A] Wound Margin: [1:Flat and Intact] [N/A:N/A] Granulation Amount: [1:Large (67-100%)] [N/A:N/A] Granulation Quality: [1:Red, Pink, Friable] [N/A:N/A] Necrotic Amount: [1:None Present (0%)] [N/A:N/A] Exposed Structures: [1:Fascia: No  Fat: No Tendon: No Muscle: No Joint: No Bone: No] [N/A:N/A] Limited to Skin Breakdown Epithelialization: Large (67-100%) N/A N/A Periwound Skin Texture: Edema: No N/A N/A Excoriation: No Induration: No Callus: No Crepitus: No Fluctuance: No Friable: No Rash: No Scarring: No Periwound Skin Maceration: Yes N/A N/A Moisture: Moist: Yes Dry/Scaly: No Periwound Skin Color: Atrophie Blanche: No N/A N/A Cyanosis: No Ecchymosis: No Erythema: No Hemosiderin Staining: No Mottled: No Pallor: No Rubor: No Temperature: No Abnormality N/A N/A Tenderness on No N/A N/A Palpation: Wound Preparation: Ulcer Cleansing: N/A N/A Rinsed/Irrigated with Saline Topical Anesthetic Applied: None Treatment Notes Electronic Signature(s) Signed: 06/29/2015 2:14:46 PM By: Gretta Cool, RN, BSN, Kim RN, BSN Entered By: Gretta Cool, RN, BSN, Kim on 06/29/2015 09:23:20 [name] [name] ([id number]) -------------------------------------------------------------------------------- [place] Details Patient Name:  [name] [name] Date of Service: 06/29/2015 8:45 AM Medical Record Number: [id number] Patient Account Number: [id number] Date of Birth/Sex: June 06, 1941 (74 y.o. Male) Treating RN: Cornell Barman Primary Care Physician: Apolonio Schneiders Other Clinician: Referring Physician: Apolonio Schneiders Treating Physician/Extender: Frann Rider in Treatment: 14 Active Inactive Orientation to the Wound Care Program Nursing Diagnoses: Knowledge deficit related to the wound healing center program Goals: Patient/caregiver will verbalize understanding of the [facility] Date Initiated: 03/23/2015 Goal Status: Active Interventions: Provide education on orientation to the wound center Notes: Wound/Skin Impairment Nursing Diagnoses: Knowledge deficit related to ulceration/compromised skin integrity Goals: Patient/caregiver will verbalize understanding of skin care regimen Date Initiated: 03/23/2015 Goal Status: Active Ulcer/skin breakdown will heal within 14 weeks Date Initiated: 03/23/2015 Goal Status: Active Interventions: Assess patient/caregiver ability to obtain necessary supplies Assess patient/caregiver ability to perform ulcer/skin care regimen upon admission and as needed Assess ulceration(s) every visit Provide education on ulcer and skin care Treatment Activities: Skin care regimen initiated : 06/29/2015 Topical wound management initiated : 06/29/2015 [name] [name] ([id number]) Notes: Electronic Signature(s) Signed: 06/29/2015 2:14:46 PM By: Gretta Cool, RN, BSN, Kim RN, BSN Entered By: Gretta Cool, RN, BSN, Kim on 06/29/2015 09:23:13 [name] [name] ([id number]) -------------------------------------------------------------------------------- Pain Assessment Details Patient Name: [name] [name] Date of Service: 06/29/2015 8:45 AM Medical Record Number: [id number] Patient Account Number: [id number] Date of Birth/Sex: 1941/05/07 (74 y.o. Male) Treating RN: Baruch Gouty, RN, BSN, [place] Primary Care  Physician: Apolonio Schneiders Other Clinician: Referring Physician: Apolonio Schneiders Treating Physician/Extender: Frann Rider in Treatment: 14 Active Problems Location of Pain Severity and Description of Pain Patient Has Paino No Site Locations Pain Management and Medication Current Pain Management: Electronic Signature(s) Signed: 06/29/2015 9:15:36 AM By: Regan Lemming BSN, RN Entered By: Regan Lemming on 06/29/2015 09:15:36 [name] [name] ([id number]) -------------------------------------------------------------------------------- Patient/Caregiver Education Details Patient Name: [name] [name] Date of Service: 06/29/2015 8:45 AM Medical Record Number: [id number] Patient Account Number: [id number] Date of Birth/Gender: 05-13-1941 (74 y.o. Male) Treating RN: Baruch Gouty, RN, BSN, [place] Primary Care Physician: Apolonio Schneiders Other Clinician: Referring Physician: Apolonio Schneiders Treating Physician/Extender: Frann Rider in Treatment: 14 Education Assessment Education Provided To: Patient Education Topics Provided Welcome To [facility]: Methods: Explain/Verbal Wound/Skin Impairment: Methods: Explain/Verbal Responses: State content correctly Electronic Signature(s) Signed: 06/29/2015 3:00:11 PM By: Regan Lemming BSN, RN Entered By: Regan Lemming on 06/29/2015 09:34:03 [name] [name] ([id number]) -------------------------------------------------------------------------------- Wound Assessment Details Patient Name: [name] [name] Date of Service: 06/29/2015 8:45 AM Medical Record Number: [id number] Patient Account Number: [id number] Date of Birth/Sex: March 09, 1941 (74 y.o. Male) Treating RN: Baruch Gouty, RN, BSN, [place] Primary Care Physician: Apolonio Schneiders Other Clinician: Referring Physician: Apolonio Schneiders Treating Physician/Extender: Christin Fudge  Weeks in Treatment: 14 Wound Status Wound Number: 1 Primary Diabetic Wound/Ulcer of the Lower Etiology: Extremity Wound Location: Left  Calcaneous - Lateral Wound Status: Open Wounding Event: Gradually Appeared Comorbid Type II Diabetes Date Acquired: 02/12/2015 History: Weeks Of Treatment: 14 Clustered Wound: No Photos Photo Uploaded By: Regan Lemming on 06/29/2015 12:20:48 Wound Measurements Length: (cm) 0.8 Width: (cm) 2 Depth: (cm) 0.1 Area: (cm) 1.257 Volume: (cm) 0.126 % Reduction in Area: -42.8% % Reduction in Volume: -43.2% Epithelialization: Large (67-100%) Tunneling: No Undermining: No Wound Description Classification: Grade 1 Wound Margin: Flat and Intact Exudate Amount: Small Exudate Type: Sanguinous Exudate Color: red Foul Odor After Cleansing: No Wound Bed Granulation Amount: Large (67-100%) Exposed Structure Granulation Quality: Red, Pink, Friable Fascia Exposed: No Necrotic Amount: None Present (0%) Fat Layer Exposed: No Tendon Exposed: No AMADOR, BRADDY. ([id number]) Muscle Exposed: No Joint Exposed: No Bone Exposed: No Limited to Skin Breakdown Periwound Skin Texture Texture Color No Abnormalities Noted: No No Abnormalities Noted: No Callus: No Atrophie Blanche: No Crepitus: No Cyanosis: No Excoriation: No Ecchymosis: No Fluctuance: No Erythema: No Friable: No Hemosiderin Staining: No Induration: No Mottled: No Localized Edema: No Pallor: No Rash: No Rubor: No Scarring: No Temperature / Pain Moisture Temperature: No Abnormality No Abnormalities Noted: No Dry / Scaly: No Maceration: Yes Moist: Yes Wound Preparation Ulcer Cleansing: Rinsed/Irrigated with Saline Topical Anesthetic Applied: None Treatment Notes Wound #1 (Left, Lateral Calcaneous) 1. Cleansed with: Clean wound with Normal Saline 3. Peri-wound Care: Skin Prep 4. Dressing Applied: Mepitel 5. Secondary Dressing Applied Bordered Foam Dressing Dry Gauze Notes darco shoe with peg assist Electronic Signature(s) Signed: 06/29/2015 9:19:48 AM By: Regan Lemming BSN, RN Entered By: Regan Lemming on  06/29/2015 09:19:48 [name] [name] ([id number]) -------------------------------------------------------------------------------- Vitals Details Patient Name: [name] [name] Date of Service: 06/29/2015 8:45 AM Medical Record Number: [id number] Patient Account Number: [id number] Date of Birth/Sex: Jul 21, 1941 (74 y.o. Male) Treating RN: Baruch Gouty, RN, BSN, [place] Primary Care Physician: Apolonio Schneiders Other Clinician: Referring Physician: Apolonio Schneiders Treating Physician/Extender: Frann Rider in Treatment: 14 Vital Signs Time Taken: 09:15 Temperature (F): 97.5 Height (in): 71 Pulse (bpm): 49 Weight (lbs): 180 Respiratory Rate (breaths/min): 16 Body Mass Index (BMI): 25.1 Blood Pressure (mmHg): 112/65 Reference Range: 80 - 120 mg / dl Electronic Signature(s) Signed: 06/29/2015 9:15:59 AM By: Regan Lemming BSN, RN Entered By: Regan Lemming on 06/29/2015 09:15:59

## 2015-06-30 ENCOUNTER — Encounter: Payer: Self-pay | Admitting: Family Medicine

## 2015-06-30 ENCOUNTER — Encounter (INDEPENDENT_AMBULATORY_CARE_PROVIDER_SITE_OTHER): Payer: Self-pay

## 2015-06-30 ENCOUNTER — Ambulatory Visit (INDEPENDENT_AMBULATORY_CARE_PROVIDER_SITE_OTHER): Payer: Medicare Other | Admitting: Family Medicine

## 2015-06-30 VITALS — BP 110/60 | HR 60 | Temp 98.1°F | Ht 71.0 in | Wt 188.0 lb

## 2015-06-30 DIAGNOSIS — G250 Essential tremor: Secondary | ICD-10-CM | POA: Insufficient documentation

## 2015-06-30 DIAGNOSIS — E118 Type 2 diabetes mellitus with unspecified complications: Secondary | ICD-10-CM

## 2015-06-30 DIAGNOSIS — Z0000 Encounter for general adult medical examination without abnormal findings: Secondary | ICD-10-CM | POA: Insufficient documentation

## 2015-06-30 DIAGNOSIS — Z79899 Other long term (current) drug therapy: Secondary | ICD-10-CM

## 2015-06-30 DIAGNOSIS — I2510 Atherosclerotic heart disease of native coronary artery without angina pectoris: Secondary | ICD-10-CM | POA: Diagnosis not present

## 2015-06-30 DIAGNOSIS — E785 Hyperlipidemia, unspecified: Secondary | ICD-10-CM | POA: Diagnosis not present

## 2015-06-30 DIAGNOSIS — I10 Essential (primary) hypertension: Secondary | ICD-10-CM

## 2015-06-30 LAB — CBC
HEMATOCRIT: 44.5 % (ref 39.0–52.0)
HEMOGLOBIN: 14.9 g/dL (ref 13.0–17.0)
MCHC: 33.5 g/dL (ref 30.0–36.0)
MCV: 91.2 fl (ref 78.0–100.0)
PLATELETS: 201 10*3/uL (ref 150.0–400.0)
RBC: 4.88 Mil/uL (ref 4.22–5.81)
RDW: 13.7 % (ref 11.5–15.5)
WBC: 6.3 10*3/uL (ref 4.0–10.5)

## 2015-06-30 LAB — COMPLETE METABOLIC PANEL WITH GFR
ALBUMIN: 3.8 g/dL (ref 3.5–5.2)
ALK PHOS: 40 U/L (ref 39–117)
ALT: 16 U/L (ref 0–53)
AST: 21 U/L (ref 0–37)
BUN: 21 mg/dL (ref 6–23)
CALCIUM: 8.9 mg/dL (ref 8.4–10.5)
CO2: 28 mEq/L (ref 19–32)
CREATININE: 0.95 mg/dL (ref 0.50–1.35)
Chloride: 106 mEq/L (ref 96–112)
GFR, EST NON AFRICAN AMERICAN: 79 mL/min
GFR, Est African American: >89 mL/min
Glucose, Bld: 65 mg/dL — ABNORMAL LOW (ref 70–99)
POTASSIUM: 5.1 meq/L (ref 3.5–5.3)
Sodium: 143 mEq/L (ref 135–145)
Total Bilirubin: 0.5 mg/dL (ref 0.2–1.2)
Total Protein: 7 g/dL (ref 6.0–8.3)

## 2015-06-30 LAB — MICROALBUMIN / CREATININE URINE RATIO
Creatinine,U: 76.6 mg/dL
MICROALB UR: 0.7 mg/dL (ref 0.0–1.9)
Microalb Creat Ratio: 0.9 mg/g (ref 0.0–30.0)

## 2015-06-30 LAB — LIPID PANEL
CHOLESTEROL: 110 mg/dL (ref 0–200)
HDL: 49.6 mg/dL (ref >39.00)
LDL CALC: 46 mg/dL (ref 0–99)
NonHDL: 60.4
Total CHOL/HDL Ratio: 2
Triglycerides: 71 mg/dL (ref 0.0–149.0)
VLDL: 14.2 mg/dL (ref 0.0–40.0)

## 2015-06-30 LAB — HEMOGLOBIN A1C: Hgb A1c MFr Bld: 5.8 % (ref 4.6–6.5)

## 2015-06-30 MED ORDER — LOSARTAN POTASSIUM 25 MG PO TABS: 12.5000 mg | ORAL_TABLET | Freq: Every day | ORAL | Status: DC

## 2015-06-30 NOTE — Progress Notes (Signed)
Subjective:    Patient ID: [name] [name], male    DOB: 05-15-1941, 74 y.o.   MRN: [id number]  HPI 74 year old male with a past medical history of insulin-dependent diabetes mellitus, hypertension, hyperlipidemia, CAD status post CABG, and essential tremor presents to clinic today to establish care.  Issues/concerns are below:  DM-2  CBG's - Fasting this am 68; All fastings <100.   Medications - Novolog 70/30; ~ 25 units BID.   Compliance - Yes.   Medication side effects  Hypoglycemia: no   Preventative Healthcare up to date?  In need of - Eye exam; Foot exam.   HTN  Stable and well controlled.   Medications - Was previously on Diovan.  Currently he has stopped taking this as he blood pressures have been so well controlled.   Compliance -  See above.   HLD  Has been well controlled.  He is compliant with Lipitor and doing well.  No reported mediation side effects.   CAD  Doing well. Stable. No recent chest pain.   Compliant with ASA, Statin.    Patient follows closely with Dr. Nehemiah Massed (Cardiology).   Essential tremor  Stable well controlled on primidone.  PMH, Surgical Hx, Family Hx, Social History reviewed and updated as below.  Past Medical History  Diagnosis Date  . Insulin dependent diabetes mellitus   . Arteriosclerotic coronary artery disease   . Chronic lower back pain   . Peyronie's disease   . Prostatitis   . Benign essential tremor   . Hyperlipidemia    Past Surgical History  Procedure Laterality Date  . Cholecystectomy  1973  . Coronary artery bypass graft  04/2010     x6, Done at [facility]   Family History  Problem Relation Age of Onset  . Arthritis Mother   . Heart disease Mother   . Heart attack Mother   . Diabetes Sister   . Heart disease Brother   . Diabetes Brother    History   Social History  . Marital Status: Married    Spouse Name: N/A  . Number of Children: N/A  . Years of Education: 16   Occupational History    . Merchant    Social History Main Topics  . Smoking status: Never Smoker   . Smokeless tobacco: Not on file  . Alcohol Use: Yes  . Drug Use: No  . Sexual Activity: Not on file   Other Topics Concern  . Not on file   Social History Narrative   Regular exercise-yes    Review of Systems  Constitutional: Negative for fever, activity change, appetite change and unexpected weight change.  HENT: Negative.   Respiratory: Negative for chest tightness and shortness of breath.   Cardiovascular: Negative for chest pain.  Gastrointestinal: Negative.   Endocrine: Negative for cold intolerance, heat intolerance, polydipsia and polyuria.  Genitourinary:       Currently being treated for UTI (urology).   Musculoskeletal: Negative.   Skin: Positive for wound.  Neurological: Positive for tremors.  Hematological: Negative.   Psychiatric/Behavioral: Negative.       Objective:   Physical Exam Filed Vitals:   06/30/15 0847  BP: 110/60  Pulse: 60  Temp: 98.1 F (36.7 C)   Vital signs reviewed. Exam: Constitutional: well appearing, conversant, NAD.  Eyes: No scleral icterus. No injection of conjunctiva. EOMI.  ENT: NCAT. Oropharynx clear. No exudate. MMM. Ears - partially obscured by cerumen bilaterally.  Neck:  Tracheal midline; No lymphadenopathy. No thyromegaly.  Lungs: No increased work of breathing. CTAB. No rales, rhonchi or wheezing.  CV: RRR, no murmur/rub/gallops. No LE edema.  Abdomen/GI: Soft, non-tender; nondistended. No masses or organomegly. No rebound or guarding. Skin: No rash; Patient does have a wound on his left heel. I did not look at this today as patient stated that he wanted to keep it dressed.  No apparent purulent discharge.  Psych: Normal mood and affect. AO x 3. Neuro: No focal deficits.   Diabetic Foot Check -  Appearance - Normal except for wound noted above.  Skin - no unusual pallor or redness Monofilament testing -  Right - Great toe, medial,  central, lateral ball and posterior foot intact Left - Great toe, medial, central, lateral ball and posterior foot intact     Assessment & Plan:  See Problem List

## 2015-06-30 NOTE — Assessment & Plan Note (Signed)
Stable and well-controlled on primidone. Advised patient to continue primidone.

## 2015-06-30 NOTE — Assessment & Plan Note (Signed)
Previously has been well-controlled. Obtain lipid panel today. Patient is to continue Lipitor 10 mg daily. Will titrate accordingly based on lipid panel results.

## 2015-06-30 NOTE — Assessment & Plan Note (Signed)
Patient is up-to-date on colonoscopy. Patient is also up-to-date on tetanus immunization as well as Zostavax. Patient in need of labs today; CBC, CMP, lipid panel, A1c, urine microalbumin obtained today. Foot exam performed today.  Encouraged patient to get his diabetic retinal exam later this year. Patient in agreement.

## 2015-06-30 NOTE — Assessment & Plan Note (Addendum)
Well-controlled and stable. Patient is currently off of his Diovan/HCT as his blood pressures have been low. His blood pressure is very well controlled currently. After discussion about therapeutic options especially in the setting of coronary artery disease, we elected to start him back on low-dose ARB (losartan 12.5 mg daily).

## 2015-06-30 NOTE — Assessment & Plan Note (Addendum)
Stable and well controlled. Patient compliant with aspirin and statin.

## 2015-06-30 NOTE — Assessment & Plan Note (Signed)
Well-controlled. Patient reports his last A1c in March was 6.2. Patient to continue NovoLog 70/30 25-30 units twice a day. Obtaining A1c today.

## 2015-06-30 NOTE — Patient Instructions (Signed)
It was nice to see you.   Continue your medications as prescribed.  I am starting you on a low dose of Losartan for mortality benefit.   Follow up with me in 6 months.  Be sure to get your eye exam later this year.   Take care  Dr. Lacinda Axon

## 2015-07-01 ENCOUNTER — Encounter: Payer: Self-pay | Admitting: Family Medicine

## 2015-07-05 ENCOUNTER — Telehealth: Payer: Self-pay

## 2015-07-05 NOTE — Telephone Encounter (Signed)
Letter was sent to patient

## 2015-07-06 ENCOUNTER — Encounter: Payer: Medicare Other | Admitting: Surgery

## 2015-07-06 DIAGNOSIS — L97421 Non-pressure chronic ulcer of left heel and midfoot limited to breakdown of skin: Secondary | ICD-10-CM | POA: Diagnosis not present

## 2015-07-06 NOTE — Telephone Encounter (Signed)
Opened in error

## 2015-07-06 NOTE — Progress Notes (Signed)
ELIUD, POLO ([id number]) Visit Report for 07/06/2015 Chief Complaint Document Details Patient Name: [name] [name], [name] [name] Date of Service: 07/06/2015 8:45 AM Medical Record Number: [id number] Patient Account Number: [id number] Date of Birth/Sex: 1941/11/30 (74 y.o. Male) Treating RN: Baruch Gouty, RN, BSN, Velva Harman Primary Care Physician: Thersa Salt Other Clinician: Referring Physician: Apolonio Schneiders Treating Physician/Extender: Frann Rider in Treatment: 15 Information Obtained from: Patient Chief Complaint Patient presents to the wound care center for a consult due non healing wound pleasant 74 year old gentleman who comes with a ulcerated area on the left lateral heel and he's had it for about a month. Electronic Signature(s) Signed: 07/06/2015 9:14:10 AM By: Christin Fudge MD, FACS Entered By: Christin Fudge on 07/06/2015 09:14:10 [name] [name] ([id number]) -------------------------------------------------------------------------------- Cellular or Tissue Based Product Details Patient Name: [name] [name] Date of Service: 07/06/2015 8:45 AM Medical Record Number: [id number] Patient Account Number: [id number] Date of Birth/Sex: 1941/10/10 (74 y.o. Male) Treating RN: Baruch Gouty, RN, BSN, Velva Harman Primary Care Physician: Thersa Salt Other Clinician: Referring Physician: Apolonio Schneiders Treating Physician/Extender: Frann Rider in Treatment: 15 Cellular or Tissue Based Wound #1 Left,Lateral Calcaneous Product Type Applied to: Performed By: Physician Pat Patrick., MD Cellular or Tissue Based Epifix Product Type: Time-Out Taken: Yes Location: genitalia / hands / feet / multiple digits Wound Size (sq cm): 1.12 Product Size (sq cm): 2 Waste Size (sq cm): 0 Amount of Product Applied (sq cm): 2 Lot #: [id number] Expiration Date: 12/25/2019 Fenestrated: No Reconstituted: No Secured: Yes Secured With: Steri-Strips Dressing Applied: Yes Primary Dressing: mepitel one Procedural  Pain: 0 Post Procedural Pain: 0 Response to Treatment: Procedure was tolerated well Electronic Signature(s) Signed: 07/06/2015 9:14:05 AM By: Christin Fudge MD, FACS Previous Signature: 07/06/2015 9:10:31 AM Version By: Regan Lemming BSN, RN Entered By: Christin Fudge on 07/06/2015 09:14:05 [name] [name] ([id number]) -------------------------------------------------------------------------------- HPI Details Patient Name: [name] [name] Date of Service: 07/06/2015 8:45 AM Medical Record Number: [id number] Patient Account Number: [id number] Date of Birth/Sex: 1941/02/02 (74 y.o. Male) Treating RN: Baruch Gouty, RN, BSN, [place] Primary Care Physician: Thersa Salt Other Clinician: Referring Physician: Apolonio Schneiders Treating Physician/Extender: Frann Rider in Treatment: 15 History of Present Illness HPI Description: 74 year old gentleman who comes as a self-referral and has been known to be a diabetic on treatment for the last 30 years and takes insulin for this. Developed a callus on the left lateral heel and does not know exactly what type of footwear cause this. Did see his dermatologist but prescribed some medication for him and also applied some Bactroban cream. Able to soften this area and remove some of the dead skin but he now has a definite ulcer there and knowing he's been a diabetic he came as a self-referral for further treatment. He's been a nonsmoker all his life and hasn't had no problems with any arterial disease and has no workup done for this. 04/06/2015 -- he has been doing his dressing on alternate days but sometimes he runs that the dressing doesn't stay on for long. He also informs me that he is going to be out on vacation this weekend and will be back only in 2 weeks. 04/27/2015 -- his blood sugars under good control and he says he's been offloading as much as possible and has no pain whatsoever. 05/04/2015 -- he has been doing very well his blood sugars are well under  control and his offloading and changing of dressing has been very compliant. 05/18/2015 - Due to some family issues he's not been  seen back for 2 weeks. he ran out of his Prisma dressing and went and bought a duodenum type of dressing form the pharmacy and has used it for the last 48 hours. He started getting a minimal swelling in his leg and had noticed a change in his wound. 06/08/2015 -- he is back after 2 weeks because of some family commitments and says has been very compliant but is frustrated that his wound has not healed yet. 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 06/29/2015 -- he is here for a wound check after his first application of Epifix Last week. he is doing fine and has no complaints. 07/06/2015 -- he is doing great and is here for a second application of a Epifix. Electronic Signature(s) Signed: 07/06/2015 9:17:14 AM By: Christin Fudge MD, FACS Entered By: Christin Fudge on 07/06/2015 09:17:14 [name] [name] ([id number]) -------------------------------------------------------------------------------- Physical Exam Details Patient Name: [name] [name] Date of Service: 07/06/2015 8:45 AM Medical Record Number: [id number] Patient Account Number: [id number] Date of Birth/Sex: 03/07/1941 (74 y.o. Male) Treating RN: Baruch Gouty, RN, BSN, Velva Harman Primary Care Physician: Thersa Salt Other Clinician: Referring Physician: Apolonio Schneiders Treating Physician/Extender: Frann Rider in Treatment: 15 Constitutional . Pulse regular. Respirations normal and unlabored. Afebrile. . Eyes Nonicteric. Reactive to light. Ears, Nose, Mouth, and Throat Lips, teeth, and gums WNL.Marland Kitchen Moist mucosa without lesions . Neck supple and nontender. No palpable supraclavicular or cervical adenopathy. Normal sized without  goiter. Respiratory WNL. No retractions.. Cardiovascular Pedal Pulses WNL. No clubbing, cyanosis or edema. Musculoskeletal Adexa without tenderness or enlargement.. Digits and nails w/o clubbing, cyanosis, infection, petechiae, ischemia, or inflammatory conditions.. Integumentary (Hair, Skin) No suspicious lesions. No crepitus or fluctuance. No peri-wound warmth or erythema. No masses.Marland Kitchen Psychiatric Judgement and insight Intact.. No evidence of depression, anxiety, or agitation.. Notes the wound looks very clean and there is a significant amount of epithelization. He will be ready for his second application of epifix Electronic Signature(s) Signed: 07/06/2015 9:18:06 AM By: Christin Fudge MD, FACS Entered By: Christin Fudge on 07/06/2015 09:18:05 [name] [name] ([id number]) -------------------------------------------------------------------------------- Physician Orders Details Patient Name: [name] [name] Date of Service: 07/06/2015 8:45 AM Medical Record Number: [id number] Patient Account Number: [id number] Date of Birth/Sex: 1941-06-24 (74 y.o. Male) Treating RN: Baruch Gouty, RN, BSN, Velva Harman Primary Care Physician: Thersa Salt Other Clinician: Referring Physician: Apolonio Schneiders Treating Physician/Extender: Frann Rider in Treatment: 15 Verbal / Phone Orders: Yes Clinician: Afful, RN, BSN, Rita Read Back and Verified: Yes Diagnosis Coding Dressing Change Frequency Wound #1 Left,Lateral Calcaneous o Change dressing every week Follow-up Appointments Wound #1 Left,Lateral Calcaneous o Return Appointment in 1 week. Off-Loading Wound #1 Left,Lateral Calcaneous o Open toe surgical shoe with peg assist. Advanced Therapies o EpiFix application in clinic; including contact layer, fixation with steri strips, dry gauze and cover dressing. - 19mm epifix applied by MD Electronic Signature(s) Signed: 07/06/2015 9:07:53 AM By: Regan Lemming BSN, RN Signed: 07/06/2015 12:36:54 PM By:  Christin Fudge MD, FACS Entered By: Regan Lemming on 07/06/2015 09:07:53 [name] [name] ([id number]) -------------------------------------------------------------------------------- Problem List Details Patient Name: [name] [name] Date of Service: 07/06/2015 8:45 AM Medical Record Number: [id number] Patient Account Number: [id number] Date of Birth/Sex: [birth year] (74 y.o. 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 Male) Treating RN: Baruch Gouty, RN, BSN, Velva Harman Primary Care Physician: Thersa Salt Other Clinician: Referring Physician: Apolonio Schneiders Treating Physician/Extender: Frann Rider in Treatment: 15 Subjective Chief Complaint Information obtained from Patient Patient presents to the wound care center for a consult due non healing wound pleasant 74 year old gentleman who comes with a ulcerated area on the left lateral heel and he's had it for about a month. History of Present Illness (HPI) 74 year old gentleman who comes as a self-referral and has been known to be a diabetic on treatment for the last 30 years and takes insulin for  this. Developed a callus on the left lateral heel and does not know exactly what type of footwear cause this. Did see his dermatologist but prescribed some medication for him and also applied some Bactroban cream. Able to soften this area and remove some of the dead skin but he now has a definite ulcer there and knowing he's been a diabetic he came as a self-referral for further treatment. He's been a nonsmoker all his life and hasn't had no problems with any arterial disease and has no workup done for this. 04/06/2015 -- he has been doing his dressing on alternate days but sometimes he runs that the dressing doesn't stay on for long. He also informs me that he is going to be out on vacation this weekend and will be back only in 2 weeks. 04/27/2015 -- his blood sugars under good control and he says he's been offloading as much as possible and has no pain whatsoever. 05/04/2015 -- he has been doing very well his blood sugars are well under control and his offloading and changing of dressing has been very compliant. 05/18/2015 - Due to some family issues he's not been seen back for 2 weeks. he ran out of his Prisma dressing and went and bought a duodenum type of dressing form the pharmacy and has used it for the last 48 hours. He started getting a minimal swelling in his leg and had noticed a change in his wound. 06/08/2015 -- he is back after 2 weeks because of some family commitments and says has been very compliant but is frustrated that his wound has not healed yet. 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 06/29/2015 -- he is here for a wound check after his first application of Epifix Last week. he is doing fine and has no complaints. 07/06/2015 -- he is doing great and is here for a second application of a Epifix. DEKENDRICK, UZELAC ([id number]) Objective Constitutional Pulse regular. Respirations normal and unlabored. Afebrile. Vitals Time Taken: 8:47 AM, Height: 71 in, Weight: 180 lbs, BMI: 25.1, Temperature: 98.1 F, Pulse: 55 bpm, Respiratory Rate: 16 breaths/min, Blood Pressure: 113/63 mmHg. Eyes  Nonicteric. Reactive to light. Ears, Nose, Mouth, and Throat Lips, teeth, and gums WNL.Marland Kitchen Moist mucosa without lesions . Neck supple and nontender. No palpable supraclavicular or cervical adenopathy. Normal sized without goiter. Respiratory WNL. No retractions.. Cardiovascular Pedal Pulses WNL. No clubbing, cyanosis or edema. Musculoskeletal Adexa without tenderness or enlargement.. Digits and nails w/o clubbing, cyanosis, infection, petechiae, ischemia, or inflammatory conditions.Marland Kitchen Psychiatric Judgement and insight Intact.. No evidence of depression, anxiety, or agitation.. General Notes: the wound looks very clean and there is a significant amount of epithelization. He will be ready for his second application of epifix Integumentary (Hair, Skin) No suspicious lesions. No crepitus or fluctuance. No peri-wound warmth or erythema. No masses.. Wound #1 status is Open. Original cause of wound was Gradually Appeared. The wound is located on the Left,Lateral Calcaneous. The wound measures 0.8cm length x 1.4cm width x 0.1cm depth; 0.88cm^2 area and 0.088cm^3 volume. The wound is limited to skin breakdown. There is no tunneling or undermining noted. There is a small amount of sanguinous drainage noted. The wound margin is flat and intact. There is large (67-100%) red, pink, friable granulation within the wound bed. There is no necrotic tissue within the wound bed. The periwound skin appearance exhibited: Maceration, Moist. The periwound skin appearance CORVIN, SORBO. ([id number]) did not exhibit: Callus, Crepitus, Excoriation, Fluctuance, Friable, Induration, Localized Edema, Rash, Scarring, Dry/Scaly, Atrophie  Blanche, Cyanosis, Ecchymosis, Hemosiderin Staining, Mottled, Pallor, Rubor, Erythema. Periwound temperature was noted as No Abnormality. 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 Procedures Wound #1 Wound #1 is a Diabetic Wound/Ulcer of the Lower Extremity located on the Left,Lateral Calcaneous. A skin graft procedure using a bioengineered skin substitute/cellular or tissue based product was performed by Marti Acebo, Jackson Latino., MD. Epifix was applied and secured with Steri-Strips. 2 sq cm of product was utilized and 0 sq cm was wasted. Post Application, mepitel one was applied. A Time Out was conducted prior to the start of the procedure. The procedure was tolerated well with a pain level of 0 throughout and a pain level of 0 following the procedure. Plan Dressing Change Frequency: Wound #1 Left,Lateral Calcaneous: Change dressing every week Follow-up Appointments: Wound #1 Left,Lateral Calcaneous: Return Appointment in 1 week. Off-Loading: Wound #1 Left,Lateral Calcaneous: Open toe surgical shoe with peg assist. HAROLD, MONCUS ([id number]) Advanced Therapies: EpiFix application in clinic; including contact layer, fixation with steri strips, dry gauze and cover dressing. - 92mm epifix applied by MD The second application of a Epifix was applied in the usual fashion and a bolster was placed over his wound and he will see [place] back next week for a wound check. Electronic Signature(s) Signed: 07/06/2015 9:19:05 AM By: Christin Fudge MD, FACS Entered By: Christin Fudge on 07/06/2015 09:19:04 [name] [name]  ([id number]) -------------------------------------------------------------------------------- SuperBill Details Patient Name: [name] [name] Date of Service: 07/06/2015 Medical Record Number: [id number] Patient Account Number: [id number] Date of Birth/Sex: 08-Jul-1941 (74 y.o. Male) Treating RN: Baruch Gouty, RN, BSN, Velva Harman Primary Care Physician: Thersa Salt Other Clinician: Referring Physician: Apolonio Schneiders Treating Physician/Extender: Frann Rider in Treatment: 15 Diagnosis Coding ICD-10 Codes Code Description E11.621 Type 2 diabetes mellitus with foot ulcer L97.421 Non-pressure chronic ulcer of left heel  and midfoot limited to breakdown of skin Facility Procedures CPT4: Description Modifier Quantity Code [id number] (Facility Use Only) Y7062 o Epifix o Per 1 SQ CM 2 CPT4: [id number] - SKIN SUB GRAFT FACE/NK/HF/G 1 ICD-10 Description Diagnosis E11.621 Type 2 diabetes mellitus with 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

## 2015-07-06 NOTE — Progress Notes (Signed)
LUISALBERTO, BEEGLE ([id number]) Visit Report for 07/06/2015 Arrival Information Details Patient Name: [name] [name], [name] [name] Date of Service: 07/06/2015 8:45 AM Medical Record Number: [id number] Patient Account Number: [id number] Date of Birth/Sex: 16-Mar-1941 (74 y.o. Male) Treating RN: Baruch Gouty, RN, BSN, Velva Harman Primary Care Physician: Thersa Salt Other Clinician: Referring Physician: Apolonio Schneiders Treating Physician/Extender: Frann Rider in Treatment: 15 Visit Information History Since Last Visit Any new allergies or adverse reactions: No Patient Arrived: [name] [name] Had a fall or experienced change in No Arrival Time: 08:47 activities of daily living that may affect Accompanied By: self risk of falls: Transfer Assistance: None Signs or symptoms of abuse/neglect since last No Patient Identification Verified: Yes visito Patient Has Alerts: Yes Hospitalized since last visit: No Has Dressing in Place as Prescribed: Yes Pain Present Now: No Electronic Signature(s) Signed: 07/06/2015 8:47:42 AM By: Regan Lemming BSN, RN Entered By: Regan Lemming on 07/06/2015 08:47:42 [name] [name] ([id number]) -------------------------------------------------------------------------------- Encounter Discharge Information Details Patient Name: [name] [name] Date of Service: 07/06/2015 8:45 AM Medical Record Number: [id number] Patient Account Number: [id number] Date of Birth/Sex: Aug 06, 1941 (74 y.o. Male) Treating RN: Baruch Gouty, RN, BSN, Velva Harman Primary Care Physician: Thersa Salt Other Clinician: Referring Physician: Apolonio Schneiders Treating Physician/Extender: Frann Rider in Treatment: 15 Encounter Discharge Information Items Discharge Pain Level: 0 Discharge Condition: Stable Ambulatory Status: Cane Discharge Destination: Home Private Transportation: Auto Accompanied By: self Schedule Follow-up Appointment: No Medication Reconciliation completed and No provided to Patient/Care Charmine Bockrath: Clinical Summary  of Care: Electronic Signature(s) Signed: 07/06/2015 9:11:44 AM By: Regan Lemming BSN, RN Entered By: Regan Lemming on 07/06/2015 09:11:44 [name] [name] ([id number]) -------------------------------------------------------------------------------- Lower Extremity Assessment Details Patient Name: [name] [name] Date of Service: 07/06/2015 8:45 AM Medical Record Number: [id number] Patient Account Number: [id number] Date of Birth/Sex: 1941-04-24 (74 y.o. Male) Treating RN: Baruch Gouty, RN, BSN, Velva Harman Primary Care Physician: Thersa Salt Other Clinician: Referring Physician: Apolonio Schneiders Treating Physician/Extender: Frann Rider in Treatment: 15 Vascular Assessment Pulses: Posterior Tibial Dorsalis Pedis Palpable: [Left:Yes] Extremity colors, hair growth, and conditions: Extremity Color: [Left:Normal] Hair Growth on Extremity: [Left:Yes] Temperature of Extremity: [Left:Warm] Capillary Refill: [Left:< 3 seconds] Dependent Rubor: [Left:No] Blanched when Elevated: [Left:No] Lipodermatosclerosis: [Left:No] Toe Nail Assessment Left: Right: Thick: No Discolored: No Deformed: No Improper Length and Hygiene: No Electronic Signature(s) Signed: 07/06/2015 8:50:49 AM By: Regan Lemming BSN, RN Entered By: Regan Lemming on 07/06/2015 08:50:48 [name] [name] ([id number]) -------------------------------------------------------------------------------- Multi Wound Chart Details Patient Name: [name] [name] Date of Service: 07/06/2015 8:45 AM Medical Record Number: [id number] Patient Account Number: [id number] Date of Birth/Sex: [birth year] (74 y.o. Male) Treating RN: Baruch Gouty, RN, BSN, [place] Primary Care Physician: Thersa Salt Other Clinician: Referring Physician: Apolonio Schneiders Treating Physician/Extender: Frann Rider in Treatment: 15 Vital Signs Height(in): 71 Pulse(bpm): 55 Weight(lbs): 180 Blood Pressure 113/63 (mmHg): Body Mass Index(BMI): 25 Temperature(F): 98.1 Respiratory  Rate 16 (breaths/min): Photos: [1:No Photos] [N/A:N/A] Wound Location: [1:Left Calcaneous - Lateral] [N/A:N/A] Wounding Event: [1:Gradually Appeared] [N/A:N/A] Primary Etiology: [1:Diabetic Wound/Ulcer of the Lower Extremity] [N/A:N/A] Comorbid History: [1:Type II Diabetes] [N/A:N/A] Date Acquired: [1:02/12/2015] [N/A:N/A] Weeks of Treatment: [1:15] [N/A:N/A] Wound Status: [1:Open] [N/A:N/A] Measurements L x W x D 0.8x1.4x0.1 [N/A:N/A] (cm) Area (cm) : [1:0.88] [N/A:N/A] Volume (cm) : [1:0.088] [N/A:N/A] % Reduction in Area: [1:0.00%] [N/A:N/A] % Reduction in Volume: 0.00% [N/A:N/A] Classification: [1:Grade 1] [N/A:N/A] Exudate Amount: [1:Small] [N/A:N/A] Exudate Type: [1:Sanguinous] [N/A:N/A] Exudate Color: [1:red] [N/A:N/A] Wound Margin: [1:Flat and Intact] [N/A:N/A] Granulation Amount: [1:Large (67-100%)] [N/A:N/A] Granulation Quality: [1:Red, Pink, Friable] [  N/A:N/A] Necrotic Amount: [1:None Present (0%)] [N/A:N/A] Exposed Structures: [1:Fascia: No Fat: No Tendon: No Muscle: No Joint: No Bone: No] [N/A:N/A] Limited to Skin Breakdown Epithelialization: Large (67-100%) N/A N/A Periwound Skin Texture: Edema: No N/A N/A Excoriation: No Induration: No Callus: No Crepitus: No Fluctuance: No Friable: No Rash: No Scarring: No Periwound Skin Maceration: Yes N/A N/A Moisture: Moist: Yes Dry/Scaly: No Periwound Skin Color: Atrophie Blanche: No N/A N/A Cyanosis: No Ecchymosis: No Erythema: No Hemosiderin Staining: No Mottled: No Pallor: No Rubor: No Temperature: No Abnormality N/A N/A Tenderness on No N/A N/A Palpation: Wound Preparation: Ulcer Cleansing: N/A N/A Rinsed/Irrigated with Saline Topical Anesthetic Applied: None Treatment Notes Electronic Signature(s) Signed: 07/06/2015 9:07:03 AM By: Regan Lemming BSN, RN Entered By: Regan Lemming on 07/06/2015 09:07:03 [name] [name]  ([id number]) -------------------------------------------------------------------------------- [place] Details Patient Name: [name] [name] Date of Service: 07/06/2015 8:45 AM Medical Record Number: [id number] Patient Account Number: [id number] Date of Birth/Sex: May 12, 1941 (74 y.o. Male) Treating RN: Baruch Gouty, RN, BSN, [place] Primary Care Physician: Thersa Salt Other Clinician: Referring Physician: Apolonio Schneiders Treating Physician/Extender: Frann Rider in Treatment: 15 Active Inactive Orientation to the Wound Care Program Nursing Diagnoses: Knowledge deficit related to the wound healing center program Goals: Patient/caregiver will verbalize understanding of the [place] Program Date Initiated: 03/23/2015 Goal Status: Active Interventions: Provide education on orientation to the wound center Notes: Wound/Skin Impairment Nursing Diagnoses: Knowledge deficit related to ulceration/compromised skin integrity Goals: Patient/caregiver will verbalize understanding of skin care regimen Date Initiated: 03/23/2015 Goal Status: Active Ulcer/skin breakdown will heal within 14 weeks Date Initiated: 03/23/2015 Goal Status: Active Interventions: Assess patient/caregiver ability to obtain necessary supplies Assess patient/caregiver ability to perform ulcer/skin care regimen upon admission and as needed Assess ulceration(s) every visit Provide education on ulcer and skin care Treatment Activities: Skin care regimen initiated : 07/06/2015 Topical wound management initiated : 07/06/2015 GEDALYA, JIM ([id number]) Notes: Electronic Signature(s) Signed: 07/06/2015 9:06:56 AM By: Regan Lemming BSN, RN Entered By: Regan Lemming on 07/06/2015 09:06:55 [name] [name] ([id number]) -------------------------------------------------------------------------------- Pain Assessment Details Patient Name: [name] [name] Date of Service: 07/06/2015 8:45 AM Medical Record  Number: [id number] Patient Account Number: [id number] Date of Birth/Sex: 13-Jul-1941 (74 y.o. Male) Treating RN: Baruch Gouty, RN, BSN, [place] Primary Care Physician: Thersa Salt Other Clinician: Referring Physician: Apolonio Schneiders Treating Physician/Extender: Frann Rider in Treatment: 15 Active Problems Location of Pain Severity and Description of Pain Patient Has Paino No Site Locations Pain Management and Medication Current Pain Management: Electronic Signature(s) Signed: 07/06/2015 8:47:57 AM By: Regan Lemming BSN, RN Entered By: Regan Lemming on 07/06/2015 08:47:57 [name] [name] ([id number]) -------------------------------------------------------------------------------- Patient/Caregiver Education Details Patient Name: [name] [name] Date of Service: 07/06/2015 8:45 AM Medical Record Number: [id number] Patient Account Number: [id number] Date of Birth/Gender: 05/30/1941 (74 y.o. Male) Treating RN: Baruch Gouty, RN, BSN, [place] Primary Care Physician: Thersa Salt Other Clinician: Referring Physician: Apolonio Schneiders Treating Physician/Extender: Frann Rider in Treatment: 15 Education Assessment Education Provided To: Patient Education Topics Provided Welcome To [facility]: Methods: Explain/Verbal Responses: State content correctly Wound/Skin Impairment: Methods: Explain/Verbal Responses: State content correctly Electronic Signature(s) Signed: 07/06/2015 9:11:59 AM By: Regan Lemming BSN, RN Entered By: Regan Lemming on 07/06/2015 09:11:59 [name] [name] ([id number]) -------------------------------------------------------------------------------- Wound Assessment Details Patient Name: [name] [name] Date of Service: 07/06/2015 8:45 AM Medical Record Number: [id number] Patient Account Number: [id number] Date of Birth/Sex: 06/12/1941 (74 y.o. Male) Treating RN: Baruch Gouty, RN, BSN, [place] Primary Care Physician: Thersa Salt Other  Clinician: Referring Physician: Apolonio Schneiders Treating  Physician/Extender: Frann Rider in Treatment: 15 Wound Status Wound Number: 1 Primary Diabetic Wound/Ulcer of the Lower Etiology: Extremity Wound Location: Left Calcaneous - Lateral Wound Status: Open Wounding Event: Gradually Appeared Comorbid Type II Diabetes Date Acquired: 02/12/2015 History: Weeks Of Treatment: 15 Clustered Wound: No Wound Measurements Length: (cm) 0.8 Width: (cm) 1.4 Depth: (cm) 0.1 Area: (cm) 0.88 Volume: (cm) 0.088 % Reduction in Area: 0% % Reduction in Volume: 0% Epithelialization: Large (67-100%) Tunneling: No Undermining: No Wound Description Classification: Grade 1 Wound Margin: Flat and Intact Exudate Amount: Small Exudate Type: Sanguinous Exudate Color: red Foul Odor After Cleansing: No Wound Bed Granulation Amount: Large (67-100%) Exposed Structure Granulation Quality: Red, Pink, Friable Fascia Exposed: No Necrotic Amount: None Present (0%) Fat Layer Exposed: No Tendon Exposed: No Muscle Exposed: No Joint Exposed: No Bone Exposed: No Limited to Skin Breakdown Periwound Skin Texture Texture Color No Abnormalities Noted: No No Abnormalities Noted: No Callus: No Atrophie Blanche: No Crepitus: No Cyanosis: No Excoriation: No Ecchymosis: No Fluctuance: No Erythema: No HEZIKIAH, RETZLOFF. ([id number]) Friable: No Hemosiderin Staining: No Induration: No Mottled: No Localized Edema: No Pallor: No Rash: No Rubor: No Scarring: No Temperature / Pain Moisture Temperature: No Abnormality No Abnormalities Noted: No Dry / Scaly: No Maceration: Yes Moist: Yes Wound Preparation Ulcer Cleansing: Rinsed/Irrigated with Saline Topical Anesthetic Applied: None Treatment Notes Wound #1 (Left, Lateral Calcaneous) 1. Cleansed with: Clean wound with Normal Saline 3. Peri-wound Care: Skin Prep 4. Dressing Applied: Other dressing (specify in notes) 5. Secondary Dressing Applied Gauze and Kerlix/Conform 6. Footwear/Offloading  device applied Other footwear/offloading device applied (specify in notes) 7. Secured with Tape Notes 15mm epifix applied by MD. Carlene Coria shoe with peg assist Electronic Signature(s) Signed: 07/06/2015 8:56:18 AM By: Regan Lemming BSN, RN Entered By: Regan Lemming on 07/06/2015 08:56:18 [name] [name] ([id number]) -------------------------------------------------------------------------------- Vitals Details Patient Name: [name] [name] Date of Service: 07/06/2015 8:45 AM Medical Record Number: [id number] Patient Account Number: [id number] Date of Birth/Sex: 02-Sep-1941 (74 y.o. Male) Treating RN: Afful, RN, BSN, [place] Primary Care Physician: Thersa Salt Other Clinician: Referring Physician: Apolonio Schneiders Treating Physician/Extender: Frann Rider in Treatment: 15 Vital Signs Time Taken: 08:47 Temperature (F): 98.1 Height (in): 71 Pulse (bpm): 55 Weight (lbs): 180 Respiratory Rate (breaths/min): 16 Body Mass Index (BMI): 25.1 Blood Pressure (mmHg): 113/63 Reference Range: 80 - 120 mg / dl Electronic Signature(s) Signed: 07/06/2015 8:50:24 AM By: Regan Lemming BSN, RN Entered By: Regan Lemming on 07/06/2015 08:50:24

## 2015-07-13 ENCOUNTER — Encounter: Payer: Medicare Other | Attending: Surgery | Admitting: Surgery

## 2015-07-13 DIAGNOSIS — L97421 Non-pressure chronic ulcer of left heel and midfoot limited to breakdown of skin: Secondary | ICD-10-CM | POA: Diagnosis present

## 2015-07-13 DIAGNOSIS — E11621 Type 2 diabetes mellitus with foot ulcer: Secondary | ICD-10-CM | POA: Diagnosis not present

## 2015-07-13 DIAGNOSIS — Z794 Long term (current) use of insulin: Secondary | ICD-10-CM | POA: Diagnosis not present

## 2015-07-13 NOTE — Progress Notes (Signed)
[name], [name] ([id number]) Visit Report for 07/13/2015 Chief Complaint Document Details Patient Name: [name] [name], [name] [name] Date of Service: 07/13/2015 8:45 AM Medical Record Number: [id number] Patient Account Number: [id number] Date of Birth/Sex: 22-Jan-1941 (74 y.o. Male) Treating RN: Primary Care Physician: Thersa Salt Other Clinician: Referring Physician: Thersa Salt Treating Physician/Extender: Frann Rider in Treatment: 16 Information Obtained from: Patient Chief Complaint Patient presents to the wound care center for a consult due non healing wound pleasant 74 year old gentleman who comes with a ulcerated area on the left lateral heel and he's had it for about a month. Electronic Signature(s) Signed: 07/13/2015 9:15:46 AM By: Christin Fudge MD, FACS Entered By: Christin Fudge on 07/13/2015 09:15:46 [name] [name] ([id number]) -------------------------------------------------------------------------------- HPI Details Patient Name: [name] [name] Date of Service: 07/13/2015 8:45 AM Medical Record Number: [id number] Patient Account Number: [id number] Date of Birth/Sex: 06/14/1941 (74 y.o. Male) Treating RN: Primary Care Physician: Thersa Salt Other Clinician: Referring Physician: Thersa Salt Treating Physician/Extender: Frann Rider in Treatment: 26 History of Present Illness HPI Description: 74 year old gentleman who comes as a self-referral and has been known to be a diabetic on treatment for the last 30 years and takes insulin for this. Developed a callus on the left lateral heel and does not know exactly what type of footwear cause this. Did see his dermatologist but prescribed some medication for him and also applied some Bactroban cream. Able to soften this area and remove some of the dead skin but he now has a definite ulcer there and knowing he's been a diabetic he came as a self-referral for further treatment. He's been a nonsmoker all his life and hasn't had no  problems with any arterial disease and has no workup done for this. 04/06/2015 -- he has been doing his dressing on alternate days but sometimes he runs that the dressing doesn't stay on for long. He also informs me that he is going to be out on vacation this weekend and will be back only in 2 weeks. 04/27/2015 -- his blood sugars under good control and he says he's been offloading as much as possible and has no pain whatsoever. 05/04/2015 -- he has been doing very well his blood sugars are well under control and his offloading and changing of dressing has been very compliant. 05/18/2015 - Due to some family issues he's not been seen back for 2 weeks. he ran out of his Prisma dressing and went and bought a duodenum type of dressing form the pharmacy and has used it for the last 48 hours. He started getting a minimal swelling in his leg and had noticed a change in his wound. 06/08/2015 -- he is back after 2 weeks because of some family commitments and says has been very compliant but is frustrated that his wound has not healed yet. 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 06/29/2015 -- he is here for a wound check after his first application of Epifix Last week. he is doing fine and has no complaints. 07/06/2015 -- he is doing great and is here for a second application of a Epifix. 07/13/2015 -- he has been doing well and is here for a wound check Electronic Signature(s) Signed: 07/13/2015 9:16:07 AM By: Christin Fudge MD, FACS Entered By: Christin Fudge on 07/13/2015 09:16:07 [name] [name] ([id number]) --------------------------------------------------------------------------------  Physical Exam Details Patient Name: [name], [name] Date of Service: 07/13/2015 8:45 AM Medical Record Number: [id number] Patient Account Number:  [id number] Date of Birth/Sex: [birth year] (74 y.o. Male) Treating RN: Primary Care Physician: Thersa Salt Other Clinician: Referring Physician: Thersa Salt Treating Physician/Extender: Frann Rider in Treatment: 16 Constitutional . Pulse regular. Respirations normal and unlabored. Afebrile. . Eyes Nonicteric. Reactive to light. Ears, Nose, Mouth, and Throat Lips, teeth, and gums WNL.Marland Kitchen Moist mucosa without lesions . Neck supple and nontender. No palpable supraclavicular or cervical adenopathy. Normal sized without goiter. Respiratory WNL. No retractions.. Cardiovascular Pedal Pulses WNL. No clubbing, cyanosis or edema. Chest Breasts symmetical and no nipple discharge.. Breast tissue WNL, no masses, lumps, or tenderness.. Lymphatic No adneopathy. No adenopathy. No adenopathy. Musculoskeletal Adexa without tenderness or enlargement.. Digits and nails w/o clubbing, cyanosis, infection, petechiae, ischemia, or inflammatory conditions.. Integumentary (Hair, Skin) No suspicious lesions. No crepitus or fluctuance. No peri-wound warmth or erythema. No masses.Marland Kitchen Psychiatric Judgement and insight Intact.. No evidence of depression, anxiety, or agitation.. Notes there is healthy granulation tissue and good epithelization and maybe some skin growing in from the edges. Overall the progress is excellent. Electronic Signature(s) Signed: 07/13/2015 9:16:38 AM By: Christin Fudge MD, FACS Entered By: Christin Fudge on 07/13/2015 09:16:38 [name] [name] ([id number]) -------------------------------------------------------------------------------- Physician Orders Details Patient Name: [name] [name] Date of Service: 07/13/2015 8:45 AM Medical Record Number: [id number] Patient Account Number: [id number] Date of Birth/Sex: 1941/05/20 (74 y.o. Male) Treating RN: Baruch Gouty, RN, BSN, Velva Harman Primary Care Physician: Thersa Salt Other Clinician: Referring Physician: Thersa Salt Treating Physician/Extender:  Frann Rider in Treatment: 57 Verbal / Phone Orders: Yes Clinician: Afful, RN, BSN, Rita Read Back and Verified: Yes Diagnosis Coding Wound Cleansing Wound #1 Left,Lateral Calcaneous o Clean wound with Normal Saline. Skin Barriers/Peri-Wound Care Wound #1 Left,Lateral Calcaneous o Skin Prep Primary Wound Dressing Wound #1 Left,Lateral Calcaneous o Mepitel One Secondary Dressing Wound #1 Left,Lateral Calcaneous o Gauze and Kerlix/Conform Dressing Change Frequency Wound #1 Left,Lateral Calcaneous o Change dressing every week Follow-up Appointments Wound #1 Left,Lateral Calcaneous o Return Appointment in 1 week. Off-Loading Wound #1 Left,Lateral Calcaneous o Open toe surgical shoe with peg assist. Electronic Signature(s) Signed: 07/13/2015 9:13:55 AM By: Regan Lemming BSN, RN Signed: 07/13/2015 12:09:58 PM By: Christin Fudge MD, FACS Entered By: Regan Lemming on 07/13/2015 09:13:55 [name] [name] ([name] ([id number]) -------------------------------------------------------------------------------- Problem List Details Patient Name: [name] [name] Date of Service: 07/13/2015 8:45 AM Medical Record Number: [id number] Patient Account Number: [id number] Date of Birth/Sex: 10/05/1941 (74 y.o. 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 Male) Treating RN: Primary Care Physician: Thersa Salt Other Clinician: Referring Physician: Thersa Salt Treating Physician/Extender: Frann Rider in Treatment: 16 Subjective Chief Complaint Information obtained from Patient Patient presents to the wound care center for a consult due non healing wound pleasant 74 year old gentleman who comes with  a ulcerated area on the left lateral heel and he's had it for about a month. History of Present Illness (HPI) 74 year old gentleman who comes as a self-referral and has been known to be a diabetic on treatment for the last 30 years and takes insulin for this. Developed a callus on the left lateral heel and does not know exactly what type of footwear cause this. Did see his dermatologist but prescribed some medication for him and also applied some Bactroban cream. Able to soften this area and remove some of the dead skin but he now has a definite ulcer there and knowing he's been a diabetic he came as a self-referral for further treatment. He's been a nonsmoker all his life and hasn't had no problems with any arterial disease and has no workup done for this. 04/06/2015 -- he has been doing his dressing on alternate days but sometimes he runs that the dressing doesn't stay on for long. He also informs me that he is going to be out on vacation this weekend and will be back only in 2 weeks. 04/27/2015 -- his blood sugars under good control and he says he's been offloading as much as possible and has no pain whatsoever. 05/04/2015 -- he has been doing very well his blood sugars are well under control and his offloading and changing of dressing has been very  compliant. 05/18/2015 - Due to some family issues he's not been seen back for 2 weeks. he ran out of his Prisma dressing and went and bought a duodenum type of dressing form the pharmacy and has used it for the last 48 hours. He started getting a minimal swelling in his leg and had noticed a change in his wound. 06/08/2015 -- he is back after 2 weeks because of some family commitments and says has been very compliant but is frustrated that his wound has not healed yet. 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 06/29/2015 -- he is here for a wound check after his first application of Epifix Last week. he is doing fine and has no complaints. 07/06/2015 -- he is doing great and is here for a second application of a Epifix. 07/13/2015 -- he has been doing well and is here for a wound check CYLER, KAPPES. ([id number]) Objective Constitutional Pulse regular. Respirations normal and unlabored. Afebrile. Vitals Time Taken: 8:57 AM, Height: 71 in, Weight: 180 lbs, BMI: 25.1, Temperature: 98.2 F, Pulse: 54 bpm, Respiratory Rate: 16 breaths/min, Blood Pressure: 128/59 mmHg. Eyes Nonicteric. Reactive to light. Ears, Nose, Mouth, and Throat Lips, teeth, and gums WNL.Marland Kitchen Moist mucosa without lesions . Neck supple and nontender. No palpable supraclavicular or cervical adenopathy. Normal sized without goiter. Respiratory WNL. No retractions.. Cardiovascular Pedal Pulses WNL. No clubbing, cyanosis or edema. Chest Breasts symmetical and no nipple discharge.. Breast tissue WNL, no masses, lumps, or tenderness.. Lymphatic No adneopathy. No adenopathy. No adenopathy. Musculoskeletal Adexa without tenderness or enlargement.. Digits and nails w/o clubbing, cyanosis, infection, petechiae, ischemia, or inflammatory  conditions.Marland Kitchen Psychiatric Judgement and insight Intact.. No evidence of depression, anxiety, or agitation.. General Notes: there is healthy granulation tissue and good epithelization and maybe some skin growing in from the edges. Overall the progress is excellent. Integumentary (Hair, Skin) No suspicious lesions. No crepitus or fluctuance. No peri-wound warmth or erythema. No  masses.Marland Kitchen RAYDON, CHAPPUIS ([id number]) Wound #1 status is Open. Original cause of wound was Gradually Appeared. The wound is located on the Left,Lateral Calcaneous. The wound measures 0.5cm length x 0.5cm width x 0.1cm depth; 0.196cm^2 area and 0.02cm^3 volume. The wound is limited to skin breakdown. There is no tunneling or undermining noted. There is a small amount of sanguinous drainage noted. The wound margin is flat and intact. There is large (67-100%) red, pink, friable granulation within the wound bed. There is no necrotic tissue within the wound bed. The periwound skin appearance exhibited: Maceration, Moist. The periwound skin appearance did not exhibit: Callus, Crepitus, Excoriation, Fluctuance, Friable, Induration, Localized Edema, Rash, Scarring, Dry/Scaly, Atrophie Blanche, Cyanosis, Ecchymosis, Hemosiderin Staining, Mottled, Pallor, Rubor, Erythema. Periwound temperature was noted as No Abnormality. 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 He will be back next week for the next application of Epifix. Plan Wound Cleansing: Wound #1 Left,Lateral Calcaneous: Clean wound with Normal Saline. Skin Barriers/Peri-Wound Care: Wound #1 Left,Lateral Calcaneous: Skin Prep Primary Wound Dressing: Wound #1 Left,Lateral Calcaneous: Mepitel One Secondary  Dressing: Wound #1 Left,Lateral Calcaneous: Gauze and Kerlix/Conform Dressing Change Frequency: Wound #1 Left,Lateral Calcaneous: Change dressing every week Follow-up Appointments: Wound #1 Left,Lateral Calcaneous: Return Appointment in 1 week. OGDEN, HANDLIN ([id number]) Off-Loading: Wound #1 Left,Lateral Calcaneous: Open toe surgical shoe with peg assist. After applying a layer of Mepitel we will bolster this down and protect it from pressure and offloading has been emphasized. He will be back next week for the next application of Epifix. Electronic Signature(s) Signed: 07/13/2015 9:17:47 AM By: Christin Fudge MD, FACS Entered By: Christin Fudge on 07/13/2015 09:17:47 [name] [name] ([id number]) -------------------------------------------------------------------------------- SuperBill Details Patient Name: [name] [name] Date of Service: 07/13/2015 Medical Record Number: [id number] Patient Account Number: [id number] Date of Birth/Sex: 1941-05-09 (74 y.o. 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

## 2015-07-13 NOTE — Progress Notes (Signed)
TRAJAN, GROVE ([id number]) Visit Report for 07/13/2015 Arrival Information Details Patient Name: [name] [name], [name] [name] Date of Service: 07/13/2015 8:45 AM Medical Record Number: [id number] Patient Account Number: [id number] Date of Birth/Sex: Sep 11, 1941 (74 y.o. Male) Treating RN: Baruch Gouty, RN, BSN, Velva Harman Primary Care Physician: Thersa Salt Other Clinician: Referring Physician: Thersa Salt Treating Physician/Extender: Frann Rider in Treatment: 11 Visit Information History Since Last Visit Any new allergies or adverse reactions: No Patient Arrived: [name] Had a fall or experienced change in No Arrival Time: 08:55 activities of daily living that may affect Accompanied By: self risk of falls: Transfer Assistance: None Signs or symptoms of abuse/neglect since last No Patient Has Alerts: Yes visito Hospitalized since last visit: No Has Dressing in Place as Prescribed: Yes Has Footwear/Offloading in Place as Yes Prescribed: Left: Wedge Shoe Pain Present Now: No Electronic Signature(s) Signed: 07/13/2015 8:57:30 AM By: Regan Lemming BSN, RN Entered By: Regan Lemming on 07/13/2015 08:57:30 [name] [name] ([id number]) -------------------------------------------------------------------------------- Clinic Level of Care Assessment Details Patient Name: [name] [name] Date of Service: 07/13/2015 8:45 AM Medical Record Number: [id number] Patient Account Number: [id number] Date of Birth/Sex: 08/22/1941 (74 y.o. Male) Treating RN: Baruch Gouty, RN, BSN, [place] Primary Care Physician: Thersa Salt Other Clinician: Referring Physician: Thersa Salt Treating Physician/Extender: Frann Rider in Treatment: 16 Clinic Level of Care Assessment Items TOOL 4 Quantity Score []  - Use when only an EandM is performed on FOLLOW-UP visit 0 ASSESSMENTS - Nursing Assessment / Reassessment X - Reassessment of Co-morbidities (includes updates in patient status) 1 10 X - Reassessment of Adherence to Treatment Plan 1  5 ASSESSMENTS - Wound and Skin Assessment / Reassessment X - Simple Wound Assessment / Reassessment - one wound 1 5 []  - Complex Wound Assessment / Reassessment - multiple wounds 0 []  - Dermatologic / Skin Assessment (not related to wound area) 0 ASSESSMENTS - Focused Assessment []  - Circumferential Edema Measurements - multi extremities 0 []  - Nutritional Assessment / Counseling / Intervention 0 X - Lower Extremity Assessment (monofilament, tuning fork, pulses) 1 5 []  - Peripheral Arterial Disease Assessment (using hand held doppler) 0 ASSESSMENTS - Ostomy and/or Continence Assessment and Care []  - Incontinence Assessment and Management 0 []  - Ostomy Care Assessment and Management (repouching, etc.) 0 PROCESS - Coordination of Care X - Simple Patient / Family Education for ongoing care 1 15 []  - Complex (extensive) Patient / Family Education for ongoing care 0 []  - Staff obtains Programmer, systems, Records, Test Results / Process Orders 0 []  - Staff telephones HHA, Nursing Homes / Clarify orders / etc 0 []  - Routine Transfer to another Facility (non-emergent condition) 0 ADGER, CANTERA. ([id number]) []  - Routine Hospital Admission (non-emergent condition) 0 []  - New Admissions / Biomedical engineer / Ordering NPWT, Apligraf, etc. 0 []  - Emergency Hospital Admission (emergent condition) 0 X - Simple Discharge Coordination 1 10 []  - Complex (extensive) Discharge Coordination 0 PROCESS - Special Needs []  - Pediatric / Minor Patient Management 0 []  - Isolation Patient Management 0 []  - Hearing / Language / Visual special needs 0 []  - Assessment of Community assistance (transportation, D/C planning, etc.) 0 []  - Additional assistance / Altered mentation 0 []  - Support Surface(s) Assessment (bed, cushion, seat, etc.) 0 INTERVENTIONS - Wound Cleansing / Measurement X - Simple Wound Cleansing - one wound 1 5 []  - Complex Wound Cleansing - multiple wounds 0 []  - Wound Imaging (photographs - any  number of wounds) 0 []  - Wound Tracing (instead of photographs)  0 X - Simple Wound Measurement - one wound 1 5 []  - Complex Wound Measurement - multiple wounds 0 INTERVENTIONS - Wound Dressings X - Small Wound Dressing one or multiple wounds 1 10 []  - Medium Wound Dressing one or multiple wounds 0 []  - Large Wound Dressing one or multiple wounds 0 []  - Application of Medications - topical 0 []  - Application of Medications - injection 0 INTERVENTIONS - Miscellaneous []  - External ear exam 0 [name] [name], [name] [name]. ([id number]) []  - Specimen Collection (cultures, biopsies, blood, body fluids, etc.) 0 []  - Specimen(s) / Culture(s) sent or taken to Lab for analysis 0 []  - Patient Transfer (multiple staff / Harrel Lemon Lift / Similar devices) 0 []  - Simple Staple / Suture removal (25 or less) 0 []  - Complex Staple / Suture removal (26 or more) 0 []  - Hypo / Hyperglycemic Management (close monitor of Blood Glucose) 0 []  - Ankle / Brachial Index (ABI) - do not check if billed separately 0 X - Vital Signs 1 5 Has the patient been seen at the hospital within the last three years: Yes Total Score: 75 Level Of Care: New/Established - Level 2 Electronic Signature(s) Signed: 07/13/2015 9:15:00 AM By: Regan Lemming BSN, RN Entered By: Regan Lemming on 07/13/2015 09:15:00 [name] [name] ([id number]) -------------------------------------------------------------------------------- Encounter Discharge Information Details Patient Name: [name] [name] Date of Service: 07/13/2015 8:45 AM Medical Record Number: [id number] Patient Account Number: [id number] Date of Birth/Sex: 1941-08-29 (74 y.o. Male) Treating RN: Baruch Gouty, RN, BSN, Velva Harman Primary Care Physician: Thersa Salt Other Clinician: Referring Physician: Thersa Salt Treating Physician/Extender: Frann Rider in Treatment: 16 Encounter Discharge Information Items Discharge Pain Level: 0 Discharge Condition: Stable Ambulatory Status: Ambulatory Discharge  Destination: Home Private Transportation: Auto Accompanied By: self Schedule Follow-up Appointment: No Medication Reconciliation completed and No provided to Patient/Care Maniyah Moller: Clinical Summary of Care: Electronic Signature(s) Signed: 07/13/2015 9:25:36 AM By: Regan Lemming BSN, RN Entered By: Regan Lemming on 07/13/2015 09:25:35 [name] [name] ([id number]) -------------------------------------------------------------------------------- Lower Extremity Assessment Details Patient Name: [name] [name] Date of Service: 07/13/2015 8:45 AM Medical Record Number: [id number] Patient Account Number: [id number] Date of Birth/Sex: 06-21-1941 (74 y.o. Male) Treating RN: Baruch Gouty, RN, BSN, [place] Harman Primary Care Physician: Thersa Salt Other Clinician: Referring Physician: Thersa Salt Treating Physician/Extender: Frann Rider in Treatment: 16 Vascular Assessment Pulses: Posterior Tibial Dorsalis Pedis Palpable: [Left:Yes] Extremity colors, hair growth, and conditions: Extremity Color: [Left:Normal] Hair Growth on Extremity: [Left:Yes] Temperature of Extremity: [Left:Warm] Capillary Refill: [Left:< 3 seconds] Electronic Signature(s) Signed: 07/13/2015 8:58:23 AM By: Regan Lemming BSN, RN Entered By: Regan Lemming on 07/13/2015 08:58:23 [name] [name] ([id number]) -------------------------------------------------------------------------------- Multi Wound Chart Details Patient Name: [name] [name] Date of Service: 07/13/2015 8:45 AM Medical Record Number: [id number] Patient Account Number: [id number] Date of Birth/Sex: Aug 11, 1941 (74 y.o. Male) Treating RN: Baruch Gouty, RN, BSN, [place] Primary Care Physician: Thersa Salt Other Clinician: Referring Physician: Thersa Salt Treating Physician/Extender: Frann Rider in Treatment: 16 Vital Signs Height(in): 71 Pulse(bpm): 54 Weight(lbs): 180 Blood Pressure 128/59 (mmHg): Body Mass Index(BMI): 25 Temperature(F): 98.2 Respiratory  Rate 16 (breaths/min): Photos: [1:No Photos] [N/A:N/A] Wound Location: [1:Left Calcaneous - Lateral] [N/A:N/A] Wounding Event: [1:Gradually Appeared] [N/A:N/A] Primary Etiology: [1:Diabetic Wound/Ulcer of the Lower Extremity] [N/A:N/A] Comorbid History: [1:Type II Diabetes] [N/A:N/A] Date Acquired: [1:02/12/2015] [N/A:N/A] Weeks of Treatment: [1:16] [N/A:N/A] Wound Status: [1:Open] [N/A:N/A] Measurements L x W x D 0.5x0.5x0.1 [N/A:N/A] (cm) Area (cm) : [1:0.196] [N/A:N/A] Volume (cm) : [1:0.02] [N/A:N/A] % Reduction in Area: [1:77.70%] [N/A:N/A] %  Reduction in Volume: 77.30% [N/A:N/A] Classification: [1:Grade 1] [N/A:N/A] Exudate Amount: [1:Small] [N/A:N/A] Exudate Type: [1:Sanguinous] [N/A:N/A] Exudate Color: [1:red] [N/A:N/A] Wound Margin: [1:Flat and Intact] [N/A:N/A] Granulation Amount: [1:Large (67-100%)] [N/A:N/A] Granulation Quality: [1:Red, Pink, Friable] [N/A:N/A] Necrotic Amount: [1:None Present (0%)] [N/A:N/A] Exposed Structures: [1:Fascia: No Fat: No Tendon: No Muscle: No Joint: No Bone: No] [N/A:N/A] Limited to Skin Breakdown Epithelialization: Large (67-100%) N/A N/A Periwound Skin Texture: Edema: No N/A N/A Excoriation: No Induration: No Callus: No Crepitus: No Fluctuance: No Friable: No Rash: No Scarring: No Periwound Skin Maceration: Yes N/A N/A Moisture: Moist: Yes Dry/Scaly: No Periwound Skin Color: Atrophie Blanche: No N/A N/A Cyanosis: No Ecchymosis: No Erythema: No Hemosiderin Staining: No Mottled: No Pallor: No Rubor: No Temperature: No Abnormality N/A N/A Tenderness on No N/A N/A Palpation: Wound Preparation: Ulcer Cleansing: N/A N/A Rinsed/Irrigated with Saline Topical Anesthetic Applied: None Treatment Notes Electronic Signature(s) Signed: 07/13/2015 9:01:12 AM By: Regan Lemming BSN, RN Entered By: Regan Lemming on 07/13/2015 09:01:12 [name] [name]  ([id number]) -------------------------------------------------------------------------------- [place] Details Patient Name: [name] [name] Date of Service: 07/13/2015 8:45 AM Medical Record Number: [id number] Patient Account Number: [id number] Date of Birth/Sex: 05/30/1941 (74 y.o. Male) Treating RN: Baruch Gouty, RN, BSN, [place] Primary Care Physician: Thersa Salt Other Clinician: Referring Physician: Thersa Salt Treating Physician/Extender: Frann Rider in Treatment: 16 Active Inactive Orientation to the Wound Care Program Nursing Diagnoses: Knowledge deficit related to the wound healing center program Goals: Patient/caregiver will verbalize understanding of the [facility] Program Date Initiated: 03/23/2015 Goal Status: Active Interventions: Provide education on orientation to the wound center Notes: Wound/Skin Impairment Nursing Diagnoses: Knowledge deficit related to ulceration/compromised skin integrity Goals: Patient/caregiver will verbalize understanding of skin care regimen Date Initiated: 03/23/2015 Goal Status: Active Ulcer/skin breakdown will heal within 14 weeks Date Initiated: 03/23/2015 Goal Status: Active Interventions: Assess patient/caregiver ability to obtain necessary supplies Assess patient/caregiver ability to perform ulcer/skin care regimen upon admission and as needed Assess ulceration(s) every visit Provide education on ulcer and skin care Treatment Activities: Skin care regimen initiated : 07/13/2015 Topical wound management initiated : 07/13/2015 ABDELRAHMAN, NAIR ([id number]) Notes: Electronic Signature(s) Signed: 07/13/2015 9:01:05 AM By: Regan Lemming BSN, RN Entered By: Regan Lemming on 07/13/2015 09:01:04 [name] [name] ([id number]) -------------------------------------------------------------------------------- Pain Assessment Details Patient Name: [name] [name] Date of Service: 07/13/2015 8:45 AM Medical Record Number:  [id number] Patient Account Number: [id number] Date of Birth/Sex: [birth year] (74 y.o. Male) Treating RN: Baruch Gouty, RN, BSN, [place] Primary Care Physician: Thersa Salt Other Clinician: Referring Physician: Thersa Salt Treating Physician/Extender: Frann Rider in Treatment: 16 Active Problems Location of Pain Severity and Description of Pain Patient Has Paino No Site Locations Pain Management and Medication Current Pain Management: Electronic Signature(s) Signed: 07/13/2015 8:57:36 AM By: Regan Lemming BSN, RN Entered By: Regan Lemming on 07/13/2015 08:57:36 [name] [name] ([id number]) -------------------------------------------------------------------------------- Patient/Caregiver Education Details Patient Name: [name] [name] Date of Service: 07/13/2015 8:45 AM Medical Record Number: [id number] Patient Account Number: [id number] Date of Birth/Gender: 1941/05/16 (74 y.o. Male) Treating RN: Baruch Gouty, RN, BSN, [place] Primary Care Physician: Thersa Salt Other Clinician: Referring Physician: Thersa Salt Treating Physician/Extender: Frann Rider in Treatment: 16 Education Assessment Education Provided To: Patient Education Topics Provided Welcome To The [facility]: Methods: Explain/Verbal Responses: State content correctly Wound/Skin Impairment: Methods: Explain/Verbal Responses: State content correctly Electronic Signature(s) Signed: 07/13/2015 9:25:54 AM By: Regan Lemming BSN, RN Entered By: Regan Lemming on 07/13/2015 09:25:54 [name] [name] ([id number]) -------------------------------------------------------------------------------- Wound Assessment Details Patient  Name: [name] [name], [name] [name] Date of Service: 07/13/2015 8:45 AM Medical Record Number: [id number] Patient Account Number: [id number] Date of Birth/Sex: Dec 13, 1940 (74 y.o. Male) Treating RN: Baruch Gouty, RN, BSN, Velva Harman Primary Care Physician: Thersa Salt Other Clinician: Referring Physician: Thersa Salt Treating  Physician/Extender: Frann Rider in Treatment: 16 Wound Status Wound Number: 1 Primary Diabetic Wound/Ulcer of the Lower Etiology: Extremity Wound Location: Left Calcaneous - Lateral Wound Status: Open Wounding Event: Gradually Appeared Comorbid Type II Diabetes Date Acquired: 02/12/2015 History: Weeks Of Treatment: 16 Clustered Wound: No Wound Measurements Length: (cm) 0.5 Width: (cm) 0.5 Depth: (cm) 0.1 Area: (cm) 0.196 Volume: (cm) 0.02 % Reduction in Area: 77.7% % Reduction in Volume: 77.3% Epithelialization: Large (67-100%) Tunneling: No Undermining: No Wound Description Classification: Grade 1 Wound Margin: Flat and Intact Exudate Amount: Small Exudate Type: Sanguinous Exudate Color: red Foul Odor After Cleansing: No Wound Bed Granulation Amount: Large (67-100%) Exposed Structure Granulation Quality: Red, Pink, Friable Fascia Exposed: No Necrotic Amount: None Present (0%) Fat Layer Exposed: No Tendon Exposed: No Muscle Exposed: No Joint Exposed: No Bone Exposed: No Limited to Skin Breakdown Periwound Skin Texture Texture Color No Abnormalities Noted: No No Abnormalities Noted: No Callus: No Atrophie Blanche: No Crepitus: No Cyanosis: No Excoriation: No Ecchymosis: No Fluctuance: No Erythema: No ASHDEN, SONNENBERG. ([id number]) Friable: No Hemosiderin Staining: No Induration: No Mottled: No Localized Edema: No Pallor: No Rash: No Rubor: No Scarring: No Temperature / Pain Moisture Temperature: No Abnormality No Abnormalities Noted: No Dry / Scaly: No Maceration: Yes Moist: Yes Wound Preparation Ulcer Cleansing: Rinsed/Irrigated with Saline Topical Anesthetic Applied: None Treatment Notes Wound #1 (Left, Lateral Calcaneous) 1. Cleansed with: Clean wound with Normal Saline 3. Peri-wound Care: Skin Prep 4. Dressing Applied: Mepitel 5. Secondary Dressing Applied Gauze and Kerlix/Conform 7. Secured with Tape Other (specify in  notes) Notes coband used to secure. darco shoe with peg assist Electronic Signature(s) Signed: 07/13/2015 9:00:41 AM By: Regan Lemming BSN, RN Entered By: Regan Lemming on 07/13/2015 09:00:41 [name] [name] ([id number]) -------------------------------------------------------------------------------- Vitals Details Patient Name: [name] [name] Date of Service: 07/13/2015 8:45 AM Medical Record Number: [id number] Patient Account Number: [id number] Date of Birth/Sex: 1941/02/21 (74 y.o. Male) Treating RN: Afful, RN, BSN, [place] Primary Care Physician: Thersa Salt Other Clinician: Referring Physician: Thersa Salt Treating Physician/Extender: Frann Rider in Treatment: 16 Vital Signs Time Taken: 08:57 Temperature (F): 98.2 Height (in): 71 Pulse (bpm): 54 Weight (lbs): 180 Respiratory Rate (breaths/min): 16 Body Mass Index (BMI): 25.1 Blood Pressure (mmHg): 128/59 Reference Range: 80 - 120 mg / dl Electronic Signature(s) Signed: 07/13/2015 8:58:03 AM By: Regan Lemming BSN, RN Entered By: Regan Lemming on 07/13/2015 08:58:03

## 2015-07-20 ENCOUNTER — Telehealth: Payer: Self-pay

## 2015-07-20 ENCOUNTER — Telehealth: Payer: Self-pay | Admitting: *Deleted

## 2015-07-20 ENCOUNTER — Other Ambulatory Visit: Payer: Self-pay | Admitting: Family Medicine

## 2015-07-20 ENCOUNTER — Encounter: Payer: Medicare Other | Admitting: Surgery

## 2015-07-20 DIAGNOSIS — L97421 Non-pressure chronic ulcer of left heel and midfoot limited to breakdown of skin: Secondary | ICD-10-CM | POA: Diagnosis not present

## 2015-07-20 DIAGNOSIS — E118 Type 2 diabetes mellitus with unspecified complications: Secondary | ICD-10-CM

## 2015-07-20 NOTE — Telephone Encounter (Signed)
Pt was told that his prescription was ready for him and he was informed that he could come by the office to get it. He stated he would comeby.

## 2015-07-20 NOTE — Telephone Encounter (Signed)
Pt called states he has a wound on his left heel that is being treated by the [facility].  Pt was advised to have a Rx written by PCP for Diabetic shoes.  Please advise

## 2015-07-20 NOTE — Telephone Encounter (Signed)
Rx printed. Patient can pick up or we can fax to medical supply company of his choosing.

## 2015-07-22 NOTE — Progress Notes (Signed)
[name], [name] ([id number]) Visit Report for 07/20/2015 Arrival Information Details Patient Name: [name] [name], [name] [name] Date of Service: 07/20/2015 9:00 AM Medical Record Number: [id number] Patient Account Number: [id number] Date of Birth/Sex: 10-09-1941 (73 y.o. Male) Treating RN: Cornell Barman Primary Care Physician: Thersa Salt Other Clinician: Referring Physician: Thersa Salt Treating Physician/Extender: Frann Rider in Treatment: 45 Visit Information History Since Last Visit Added or deleted any medications: No Patient Arrived: Ambulatory Any new allergies or adverse reactions: No Arrival Time: 09:12 Had a fall or experienced change in No Accompanied By: self activities of daily living that may affect Transfer Assistance: Manual risk of falls: Patient Identification Verified: Yes Signs or symptoms of abuse/neglect since last No Secondary Verification Process Yes visito Completed: Has Dressing in Place as Prescribed: Yes Patient Has Alerts: Yes Pain Present Now: No Electronic Signature(s) Signed: 07/21/2015 2:59:35 PM By: Gretta Cool, RN, BSN, Kim RN, BSN Entered By: Gretta Cool, RN, BSN, Kim on 07/20/2015 09:13:11 [name] [name] ([id number]) -------------------------------------------------------------------------------- Clinic Level of Care Assessment Details Patient Name: [name] [name] Date of Service: 07/20/2015 9:00 AM Medical Record Number: [id number] Patient Account Number: [id number] Date of Birth/Sex: 09-21-1941 (74 y.o. Male) Treating RN: Cornell Barman Primary Care Physician: Thersa Salt Other Clinician: Referring Physician: Thersa Salt Treating Physician/Extender: Frann Rider in Treatment: 17 Clinic Level of Care Assessment Items TOOL 4 Quantity Score []  - Use when only an EandM is performed on FOLLOW-UP visit 0 ASSESSMENTS - Nursing Assessment / Reassessment []  - Reassessment of Co-morbidities (includes updates in patient status) 0 X - Reassessment of Adherence to  Treatment Plan 1 5 ASSESSMENTS - Wound and Skin Assessment / Reassessment X - Simple Wound Assessment / Reassessment - one wound 1 5 []  - Complex Wound Assessment / Reassessment - multiple wounds 0 []  - Dermatologic / Skin Assessment (not related to wound area) 0 ASSESSMENTS - Focused Assessment []  - Circumferential Edema Measurements - multi extremities 0 []  - Nutritional Assessment / Counseling / Intervention 0 []  - Lower Extremity Assessment (monofilament, tuning fork, pulses) 0 []  - Peripheral Arterial Disease Assessment (using hand held doppler) 0 ASSESSMENTS - Ostomy and/or Continence Assessment and Care []  - Incontinence Assessment and Management 0 []  - Ostomy Care Assessment and Management (repouching, etc.) 0 PROCESS - Coordination of Care X - Simple Patient / Family Education for ongoing care 1 15 []  - Complex (extensive) Patient / Family Education for ongoing care 0 []  - Staff obtains Programmer, systems, Records, Test Results / Process Orders 0 []  - Staff telephones HHA, Nursing Homes / Clarify orders / etc 0 []  - Routine Transfer to another Facility (non-emergent condition) 0 ZIGMOND, TRELA. ([id number]) []  - Routine Hospital Admission (non-emergent condition) 0 []  - New Admissions / Biomedical engineer / Ordering NPWT, Apligraf, etc. 0 []  - Emergency Hospital Admission (emergent condition) 0 X - Simple Discharge Coordination 1 10 []  - Complex (extensive) Discharge Coordination 0 PROCESS - Special Needs []  - Pediatric / Minor Patient Management 0 []  - Isolation Patient Management 0 []  - Hearing / Language / Visual special needs 0 []  - Assessment of Community assistance (transportation, D/C planning, etc.) 0 []  - Additional assistance / Altered mentation 0 []  - Support Surface(s) Assessment (bed, cushion, seat, etc.) 0 INTERVENTIONS - Wound Cleansing / Measurement X - Simple Wound Cleansing - one wound 1 5 []  - Complex Wound Cleansing - multiple wounds 0 X - Wound Imaging  (photographs - any number of wounds) 1 5 []  - Wound Tracing (instead of photographs) 0  X - Simple Wound Measurement - one wound 1 5 []  - Complex Wound Measurement - multiple wounds 0 INTERVENTIONS - Wound Dressings X - Small Wound Dressing one or multiple wounds 1 10 []  - Medium Wound Dressing one or multiple wounds 0 []  - Large Wound Dressing one or multiple wounds 0 []  - Application of Medications - topical 0 []  - Application of Medications - injection 0 INTERVENTIONS - Miscellaneous []  - External ear exam 0 TAYSON, SCHNELLE. ([id number]) []  - Specimen Collection (cultures, biopsies, blood, body fluids, etc.) 0 []  - Specimen(s) / Culture(s) sent or taken to Lab for analysis 0 []  - Patient Transfer (multiple staff / Harrel Lemon Lift / Similar devices) 0 []  - Simple Staple / Suture removal (25 or less) 0 []  - Complex Staple / Suture removal (26 or more) 0 []  - Hypo / Hyperglycemic Management (close monitor of Blood Glucose) 0 []  - Ankle / Brachial Index (ABI) - do not check if billed separately 0 X - Vital Signs 1 5 Has the patient been seen at the hospital within the last three years: Yes Total Score: 65 Level Of Care: New/Established - Level 2 Electronic Signature(s) Signed: 07/21/2015 2:59:35 PM By: Gretta Cool, RN, BSN, Kim RN, BSN Entered By: Gretta Cool, RN, BSN, Kim on 07/20/2015 09:43:09 [name] [name] ([id number]) -------------------------------------------------------------------------------- Encounter Discharge Information Details Patient Name: [name] [name] Date of Service: 07/20/2015 9:00 AM Medical Record Number: [id number] Patient Account Number: [id number] Date of Birth/Sex: Apr 26, 1941 (74 y.o. Male) Treating RN: Cornell Barman Primary Care Physician: Thersa Salt Other Clinician: Referring Physician: Thersa Salt Treating Physician/Extender: Frann Rider in Treatment: 17 Encounter Discharge Information Items Discharge Pain Level: 0 Discharge Condition: Stable Ambulatory Status:  Cane Discharge Destination: Home Private Transportation: Auto Accompanied By: self Schedule Follow-up Appointment: Yes Medication Reconciliation completed and Yes provided to Patient/Care Treston Coker: Patient Clinical Summary of Care: Declined Electronic Signature(s) Signed: 07/20/2015 11:11:24 AM By: Ruthine Dose Entered By: Ruthine Dose on 07/20/2015 11:11:24 [name] [name] ([id number]) -------------------------------------------------------------------------------- Lower Extremity Assessment Details Patient Name: [name] [name] Date of Service: 07/20/2015 9:00 AM Medical Record Number: [id number] Patient Account Number: [id number] Date of Birth/Sex: [birth year] (74 y.o. Male) Treating RN: Cornell Barman Primary Care Physician: Thersa Salt Other Clinician: Referring Physician: Thersa Salt Treating Physician/Extender: Frann Rider in Treatment: 17 Vascular Assessment Pulses: Posterior Tibial Dorsalis Pedis Palpable: [Left:Yes] Extremity colors, hair growth, and conditions: Extremity Color: [Left:Normal] Hair Growth on Extremity: [Left:Yes] Temperature of Extremity: [Left:Warm] Capillary Refill: [Left:< 3 seconds] Toe Nail Assessment Left: Right: Thick: No Discolored: No Deformed: No Improper Length and Hygiene: No Electronic Signature(s) Signed: 07/21/2015 2:59:35 PM By: Gretta Cool, RN, BSN, Kim RN, BSN Entered By: Gretta Cool, RN, BSN, Kim on 07/20/2015 09:18:20 [name] [name] ([id number]) -------------------------------------------------------------------------------- Multi Wound Chart Details Patient Name: [name] [name] Date of Service: 07/20/2015 9:00 AM Medical Record Number: [id number] Patient Account Number: [id number] Date of Birth/Sex: October 28, 1941 (74 y.o. Male) Treating RN: Cornell Barman Primary Care Physician: Thersa Salt Other Clinician: Referring Physician: Thersa Salt Treating Physician/Extender: Frann Rider in Treatment: 17 Vital Signs Height(in):  71 Pulse(bpm): 56 Weight(lbs): 180 Blood Pressure 125/63 (mmHg): Body Mass Index(BMI): 25 Temperature(F): 97.8 Respiratory Rate 16 (breaths/min): Photos: [1:No Photos] [N/A:N/A] Wound Location: [1:Left Calcaneous - Lateral] [N/A:N/A] Wounding Event: [1:Gradually Appeared] [N/A:N/A] Primary Etiology: [1:Diabetic Wound/Ulcer of the Lower Extremity] [N/A:N/A] Comorbid History: [1:Type II Diabetes] [N/A:N/A] Date Acquired: [1:02/12/2015] [N/A:N/A] Weeks of Treatment: [1:17] [N/A:N/A] Wound Status: [1:Open] [N/A:N/A] Measurements L x W x D 0.2x0.2x0.1 [N/A:N/A] (cm)  Area (cm) : [1:0.031] [N/A:N/A] Volume (cm) : [1:0.003] [N/A:N/A] % Reduction in Area: [1:96.50%] [N/A:N/A] % Reduction in Volume: 96.60% [N/A:N/A] Classification: [1:Grade 1] [N/A:N/A] Exudate Amount: [1:Small] [N/A:N/A] Exudate Type: [1:Sanguinous] [N/A:N/A] Exudate Color: [1:red] [N/A:N/A] Wound Margin: [1:Flat and Intact] [N/A:N/A] Granulation Amount: [1:Large (67-100%)] [N/A:N/A] Granulation Quality: [1:Red, Pink, Friable] [N/A:N/A] Necrotic Amount: [1:None Present (0%)] [N/A:N/A] Exposed Structures: [1:Fascia: No Fat: No Tendon: No Muscle: No Joint: No Bone: No] [N/A:N/A] Limited to Skin Breakdown Epithelialization: Large (67-100%) N/A N/A Periwound Skin Texture: Edema: No N/A N/A Excoriation: No Induration: No Callus: No Crepitus: No Fluctuance: No Friable: No Rash: No Scarring: No Periwound Skin Moist: Yes N/A N/A Moisture: Maceration: No Dry/Scaly: No Periwound Skin Color: Atrophie Blanche: No N/A N/A Cyanosis: No Ecchymosis: No Erythema: No Hemosiderin Staining: No Mottled: No Pallor: No Rubor: No Temperature: No Abnormality N/A N/A Tenderness on No N/A N/A Palpation: Wound Preparation: Ulcer Cleansing: N/A N/A Rinsed/Irrigated with Saline Topical Anesthetic Applied: None Treatment Notes Electronic Signature(s) Signed: 07/21/2015 2:59:35 PM By: Gretta Cool, RN, BSN, Kim RN,  BSN Entered By: Gretta Cool, RN, BSN, Kim on 07/20/2015 33:29:51 [name] [name] ([id number]) -------------------------------------------------------------------------------- [place] Details Patient Name: [name] [name] Date of Service: 07/20/2015 9:00 AM Medical Record Number: [id number] Patient Account Number: [id number] Date of Birth/Sex: 05-01-1941 (74 y.o. Male) Treating RN: Cornell Barman Primary Care Physician: Thersa Salt Other Clinician: Referring Physician: Thersa Salt Treating Physician/Extender: Frann Rider in Treatment: 17 Active Inactive Orientation to the Wound Care Program Nursing Diagnoses: Knowledge deficit related to the wound healing center program Goals: Patient/caregiver will verbalize understanding of the [facility] Date Initiated: 03/23/2015 Goal Status: Active Interventions: Provide education on orientation to the wound center Notes: Wound/Skin Impairment Nursing Diagnoses: Knowledge deficit related to ulceration/compromised skin integrity Goals: Patient/caregiver will verbalize understanding of skin care regimen Date Initiated: 03/23/2015 Goal Status: Active Ulcer/skin breakdown will heal within 14 weeks Date Initiated: 03/23/2015 Goal Status: Active Interventions: Assess patient/caregiver ability to obtain necessary supplies Assess patient/caregiver ability to perform ulcer/skin care regimen upon admission and as needed Assess ulceration(s) every visit Provide education on ulcer and skin care Treatment Activities: Skin care regimen initiated : 07/20/2015 Topical wound management initiated : 07/20/2015 [name] [name] ([id number]) Notes: Electronic Signature(s) Signed: 07/21/2015 2:59:35 PM By: Gretta Cool, RN, BSN, Kim RN, BSN Entered By: Gretta Cool, RN, BSN, Kim on 07/20/2015 09:23:06 [name] [name] ([id number]) -------------------------------------------------------------------------------- Pain Assessment Details Patient  Name: [name] [name] Date of Service: 07/20/2015 9:00 AM Medical Record Number: [id number] Patient Account Number: [id number] Date of Birth/Sex: 18-Feb-1941 (74 y.o. Male) Treating RN: Cornell Barman Primary Care Physician: Thersa Salt Other Clinician: Referring Physician: Thersa Salt Treating Physician/Extender: Frann Rider in Treatment: 17 Active Problems Location of Pain Severity and Description of Pain Patient Has Paino No Site Locations Pain Management and Medication Current Pain Management: Electronic Signature(s) Signed: 07/21/2015 2:59:35 PM By: Gretta Cool, RN, BSN, Kim RN, BSN Entered By: Gretta Cool, RN, BSN, Kim on 07/20/2015 09:13:16 [name] [name] ([id number]) -------------------------------------------------------------------------------- Patient/Caregiver Education Details Patient Name: [name] [name] Date of Service: 07/20/2015 9:00 AM Medical Record Number: [id number] Patient Account Number: [id number] Date of Birth/Gender: June 10, 1941 (74 y.o. Male) Treating RN: Cornell Barman Primary Care Physician: Thersa Salt Other Clinician: Referring Physician: Thersa Salt Treating Physician/Extender: Frann Rider in Treatment: 36 Education Assessment Education Provided To: Patient Education Topics Provided Wound/Skin Impairment: Handouts: Other: continue wound care as prescribed Electronic Signature(s) Signed: 07/21/2015 2:59:35 PM By: Gretta Cool, RN, BSN, Kim RN, BSN Entered By:  Gretta Cool, RN, BSN, Kim on 07/20/2015 09:44:31 [name] [name] ([id number]) -------------------------------------------------------------------------------- Wound Assessment Details Patient Name: MALAKI, KOURY Date of Service: 07/20/2015 9:00 AM Medical Record Number: [id number] Patient Account Number: [id number] Date of Birth/Sex: 1941-01-02 (73 y.o. Male) Treating RN: Cornell Barman Primary Care Physician: Thersa Salt Other Clinician: Referring Physician: Thersa Salt Treating Physician/Extender: Frann Rider in Treatment: 17 Wound Status Wound Number: 1 Primary Diabetic Wound/Ulcer of the Lower Etiology: Extremity Wound Location: Left Calcaneous - Lateral Wound Status: Open Wounding Event: Gradually Appeared Comorbid Type II Diabetes Date Acquired: 02/12/2015 History: Weeks Of Treatment: 17 Clustered Wound: No Photos Photo Uploaded By: Gretta Cool, RN, BSN, Kim on 07/20/2015 10:54:53 Wound Measurements Length: (cm) 0.2 Width: (cm) 0.2 Depth: (cm) 0.1 Area: (cm) 0.031 Volume: (cm) 0.003 % Reduction in Area: 96.5% % Reduction in Volume: 96.6% Epithelialization: Large (67-100%) Wound Description Classification: Grade 1 Wound Margin: Flat and Intact Exudate Amount: Small Exudate Type: Sanguinous Exudate Color: red Foul Odor After Cleansing: No Wound Bed Granulation Amount: Large (67-100%) Exposed Structure Granulation Quality: Red, Pink, Friable Fascia Exposed: No Necrotic Amount: None Present (0%) Fat Layer Exposed: No Tendon Exposed: No DENVIL, CANNING. ([id number]) Muscle Exposed: No Joint Exposed: No Bone Exposed: No Limited to Skin Breakdown Periwound Skin Texture Texture Color No Abnormalities Noted: No No Abnormalities Noted: No Callus: No Atrophie Blanche: No Crepitus: No Cyanosis: No Excoriation: No Ecchymosis: No Fluctuance: No Erythema: No Friable: No Hemosiderin Staining: No Induration: No Mottled: No Localized Edema: No Pallor: No Rash: No Rubor: No Scarring: No Temperature / Pain Moisture Temperature: No Abnormality No Abnormalities Noted: No Dry / Scaly: No Maceration: No Moist: Yes Wound Preparation Ulcer Cleansing: Rinsed/Irrigated with Saline Topical Anesthetic Applied: None Treatment Notes Wound #1 (Left, Lateral Calcaneous) 1. Cleansed with: Clean wound with Normal Saline 4. Dressing Applied: Mepitel 5. Secondary Dressing Applied Dry Gauze Notes coband used to secure. darco shoe with peg assist Electronic  Signature(s) Signed: 07/21/2015 2:59:35 PM By: Gretta Cool, RN, BSN, Kim RN, BSN Entered By: Gretta Cool, RN, BSN, Kim on 07/20/2015 42:68:34 [name] [name] ([id number]) -------------------------------------------------------------------------------- Vitals Details Patient Name: [name] [name] Date of Service: 07/20/2015 9:00 AM Medical Record Number: [id number] Patient Account Number: [id number] Date of Birth/Sex: Apr 20, 1941 (74 y.o. Male) Treating RN: Cornell Barman Primary Care Physician: Thersa Salt Other Clinician: Referring Physician: Thersa Salt Treating Physician/Extender: Frann Rider in Treatment: 17 Vital Signs Time Taken: 09:13 Temperature (F): 97.8 Height (in): 71 Pulse (bpm): 56 Weight (lbs): 180 Respiratory Rate (breaths/min): 16 Body Mass Index (BMI): 25.1 Blood Pressure (mmHg): 125/63 Reference Range: 80 - 120 mg / dl Electronic Signature(s) Signed: 07/21/2015 2:59:35 PM By: Gretta Cool, RN, BSN, Kim RN, BSN Entered By: Gretta Cool, RN, BSN, Kim on 07/20/2015 09:13:39

## 2015-07-22 NOTE — Progress Notes (Signed)
[name] [name] ([id number]) Visit Report for 07/20/2015 Chief Complaint Document Details Patient Name: [name] [name], [name] [name] Date of Service: 07/20/2015 9:00 AM Medical Record Number: [id number] Patient Account Number: [id number] Date of Birth/Sex: 03/07/1941 (74 y.o. Male) Treating RN: Primary Care Physician: Thersa Salt Other Clinician: Referring Physician: Thersa Salt Treating Physician/Extender: Frann Rider in Treatment: 17 Information Obtained from: Patient Chief Complaint Patient presents to the wound care center for a consult due non healing wound pleasant 74 year old gentleman who comes with a ulcerated area on the left lateral heel and he's had it for about a month. Electronic Signature(s) Signed: 07/20/2015 9:37:51 AM By: Christin Fudge MD, FACS Entered By: Christin Fudge on 07/20/2015 09:37:51 [name] [name] ([id number]) -------------------------------------------------------------------------------- HPI Details Patient Name: [name] [name] Date of Service: 07/20/2015 9:00 AM Medical Record Number: [id number] Patient Account Number: [id number] Date of Birth/Sex: [birth year] (74 y.o. Male) Treating RN: Primary Care Physician: Thersa Salt Other Clinician: Referring Physician: Thersa Salt Treating Physician/Extender: Frann Rider in Treatment: 38 History of Present Illness HPI Description: 74 year old gentleman who comes as a self-referral and has been known to be a diabetic on treatment for the last 30 years and takes insulin for this. Developed a callus on the left lateral heel and does not know exactly what type of footwear cause this. Did see his dermatologist but prescribed some medication for him and also applied some Bactroban cream. Able to soften this area and remove some of the dead skin but he now has a definite ulcer there and knowing he's been a diabetic he came as a self-referral for further treatment. He's been a nonsmoker all his life and hasn't had no  problems with any arterial disease and has no workup done for this. 04/06/2015 -- he has been doing his dressing on alternate days but sometimes he runs that the dressing doesn't stay on for long. He also informs me that he is going to be out on vacation this weekend and will be back only in 2 weeks. 04/27/2015 -- his blood sugars under good control and he says he's been offloading as much as possible and has no pain whatsoever. 05/04/2015 -- he has been doing very well his blood sugars are well under control and his offloading and changing of dressing has been very compliant. 05/18/2015 - Due to some family issues he's not been seen back for 2 weeks. he ran out of his Prisma dressing and went and bought a duodenum type of dressing form the pharmacy and has used it for the last 48 hours. He started getting a minimal swelling in his leg and had noticed a change in his wound. 06/08/2015 -- he is back after 2 weeks because of some family commitments and says has been very compliant but is frustrated that his wound has not healed yet. 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 06/29/2015 -- he is here for a wound check after his first application of Epifix Last week. he is doing fine and has no complaints. 07/06/2015 -- he is doing great and is here for a second application of a Epifix. 07/13/2015 -- he has been doing well and is here for a wound check Electronic Signature(s) Signed: 07/20/2015 9:37:58 AM By: Christin Fudge MD, FACS Entered By: Christin Fudge on 07/20/2015 09:37:58 [name] [name] ([id number]) --------------------------------------------------------------------------------  Physical Exam Details Patient Name: [name] [name], [name] [name] Date of Service: 07/20/2015 9:00 AM Medical Record Number: [id number] Patient Account Number:  [id number] Date of Birth/Sex: Sep 10, 1941 (74 y.o. Male) Treating RN: Primary Care Physician: Thersa Salt Other Clinician: Referring Physician: Thersa Salt Treating Physician/Extender: Frann Rider in Treatment: 17 Constitutional . Pulse regular. Respirations normal and unlabored. Afebrile. . Eyes Nonicteric. Reactive to light. Ears, Nose, Mouth, and Throat Lips, teeth, and gums WNL.Marland Kitchen Moist mucosa without lesions . Neck supple and nontender. No palpable supraclavicular or cervical adenopathy. Normal sized without goiter. Respiratory WNL. No retractions.. Cardiovascular Pedal Pulses WNL. No clubbing, cyanosis or edema. Lymphatic No adneopathy. No adenopathy. No adenopathy. Musculoskeletal Adexa without tenderness or enlargement.. Digits and nails w/o clubbing, cyanosis, infection, petechiae, ischemia, or inflammatory conditions.. Integumentary (Hair, Skin) No suspicious lesions. No crepitus or fluctuance. No peri-wound warmth or erythema. No masses.Marland Kitchen Psychiatric Judgement and insight Intact.. No evidence of depression, anxiety, or agitation.. Notes The left lateral heel looks excellent with healthy epithelization and skin. He will not need any more skin substitute like Epifix. Electronic Signature(s) Signed: 07/20/2015 9:39:12 AM By: Christin Fudge MD, FACS Entered By: Christin Fudge on 07/20/2015 09:39:12 [name] [name] ([id number]) -------------------------------------------------------------------------------- Physician Orders Details Patient Name: [name] [name] Date of Service: 07/20/2015 9:00 AM Medical Record Number: [id number] Patient Account Number: [id number] Date of Birth/Sex: February 05, 1941 (74 y.o. Male) Treating RN: Cornell Barman Primary Care Physician: Thersa Salt Other Clinician: Referring Physician: Thersa Salt Treating Physician/Extender: Frann Rider in Treatment: 84 Verbal / Phone Orders: Yes Clinician: Cornell Barman Read Back and Verified:  Yes Diagnosis Coding Wound Cleansing Wound #1 Left,Lateral Calcaneous o Clean wound with Normal Saline. Primary Wound Dressing Wound #1 Left,Lateral Calcaneous o Mepitel One o Dry Gauze - bolster secure with Coban Dressing Change Frequency Wound #1 Left,Lateral Calcaneous o Change dressing every week Follow-up Appointments Wound #1 Left,Lateral Calcaneous o Return Appointment in 1 week. Off-Loading Wound #1 Left,Lateral Calcaneous o Open toe surgical shoe with peg assist. Electronic Signature(s) Signed: 07/20/2015 12:16:19 PM By: Christin Fudge MD, FACS Signed: 07/21/2015 2:59:35 PM By: Gretta Cool, RN, BSN, Kim RN, BSN Entered By: Gretta Cool, RN, BSN, Kim on 07/20/2015 09:25:41 [name] [name] ([id number]) -------------------------------------------------------------------------------- Problem List Details Patient Name: [name] [name] Date of Service: 07/20/2015 9:00 AM Medical Record Number: [id number] Patient Account Number: [id number] Date of Birth/Sex: 1941-08-07 (74 y.o. 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 Male) Treating RN: Primary Care  Physician: Thersa Salt Other Clinician: Referring Physician: Thersa Salt Treating Physician/Extender: Frann Rider in Treatment: 17 Subjective Chief Complaint Information obtained from Patient Patient presents to the wound care center for a consult due non healing wound pleasant 74 year old gentleman who comes with a ulcerated area on the left lateral heel and he's had it for about a month. History of Present Illness (HPI) 74 year old gentleman who comes as a self-referral and has been known  to be a diabetic on treatment for the last 30 years and takes insulin for this. Developed a callus on the left lateral heel and does not know exactly what type of footwear cause this. Did see his dermatologist but prescribed some medication for him and also applied some Bactroban cream. Able to soften this area and remove some of the dead skin but he now has a definite ulcer there and knowing he's been a diabetic he came as a self-referral for further treatment. He's been a nonsmoker all his life and hasn't had no problems with any arterial disease and has no workup done for this. 04/06/2015 -- he has been doing his dressing on alternate days but sometimes he runs that the dressing doesn't stay on for long. He also informs me that he is going to be out on vacation this weekend and will be back only in 2 weeks. 04/27/2015 -- his blood sugars under good control and he says he's been offloading as much as possible and has no pain whatsoever. 05/04/2015 -- he has been doing very well his blood sugars are well under control and his offloading and changing of dressing has been very compliant. 05/18/2015 - Due to some family issues he's not been seen back for 2 weeks. he ran out of his Prisma dressing and went and bought a duodenum type of dressing form the pharmacy and has used it for the last 48 hours. He started getting a minimal swelling in his leg and had noticed a change in his  wound. 06/08/2015 -- he is back after 2 weeks because of some family commitments and says has been very compliant but is frustrated that his wound has not healed yet. 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 06/29/2015 -- he is here for a wound check after his first application of Epifix Last week. he is doing fine and has no complaints. 07/06/2015 -- he is doing great and is here for a second application of a Epifix. 07/13/2015 -- he has been doing well and is here for a wound check [name] [name], [name] [name]. ([id number]) Objective Constitutional Pulse regular. Respirations normal and unlabored. Afebrile. Vitals Time Taken: 9:13 AM, Height: 71 in, Weight: 180 lbs, BMI: 25.1, Temperature: 97.8 F, Pulse: 56 bpm, Respiratory Rate: 16 breaths/min, Blood Pressure: 125/63 mmHg. Eyes Nonicteric. Reactive to light. Ears, Nose, Mouth, and Throat Lips, teeth, and gums WNL.Marland Kitchen Moist mucosa without lesions . Neck supple and nontender. No palpable supraclavicular or cervical adenopathy. Normal sized without goiter. Respiratory WNL. No retractions.. Cardiovascular Pedal Pulses WNL. No clubbing, cyanosis or edema. Lymphatic No adneopathy. No adenopathy. No adenopathy. Musculoskeletal Adexa without tenderness or enlargement.. Digits and nails w/o clubbing, cyanosis, infection, petechiae, ischemia, or inflammatory conditions.Marland Kitchen Psychiatric Judgement and insight Intact.. No evidence of depression, anxiety, or agitation.. General Notes: The left lateral heel looks excellent with healthy epithelization and skin. He will not need any more skin substitute like Epifix. Integumentary (Hair, Skin) No suspicious lesions. No crepitus or fluctuance. No peri-wound warmth or erythema. No masses.. Wound #1 status is Open. Original cause of wound was  Gradually Appeared. The wound is located on the Left,Lateral Calcaneous. The wound measures 0.2cm length x 0.2cm width x 0.1cm depth; 0.031cm^2 area and 0.003cm^3 volume. The wound is limited to skin breakdown. There is a small  amount of sanguinous [name] [name], [name] [name]. ([id number]) drainage noted. The wound margin is flat and intact. There is large (67-100%) red, pink, friable granulation within the wound bed. There is no necrotic tissue within the wound bed. The periwound skin appearance exhibited: Moist. The periwound skin appearance did not exhibit: Callus, Crepitus, Excoriation, Fluctuance, Friable, Induration, Localized Edema, Rash, Scarring, Dry/Scaly, Maceration, Atrophie Blanche, Cyanosis, Ecchymosis, Hemosiderin Staining, Mottled, Pallor, Rubor, Erythema. Periwound temperature was noted as No Abnormality. 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 He will offload as much as possible and we have spent some time discussing the need for proper diabetic shoes once this is completely healed. He will get his primary care doctor to get him a prescription for the orthotic shoes at [facility]. He will come back and see as next week. Plan Wound Cleansing: Wound #1 Left,Lateral Calcaneous: Clean wound with Normal Saline. Primary Wound Dressing: Wound #1 Left,Lateral Calcaneous: Mepitel One Dry Gauze - bolster secure with Coban Dressing Change Frequency: Wound #1 Left,Lateral Calcaneous: Change dressing every week Follow-up Appointments: Wound #1 Left,Lateral Calcaneous: Return Appointment in 1 week. Off-Loading: Wound #1 Left,Lateral Calcaneous: [name] [name], [name] [name]. ([id number]) Open toe surgical shoe with peg  assist. I have recommended a piece of Mepitel over this and a bolster of gauze to hold it in place and a Coban bandage over this to protect the area. He will offload as much as possible and we have spent some time discussing the need for proper diabetic shoes once this is completely healed. He will get his primary care doctor to get him a prescription for the orthotic shoes at [facility]. He will come back and see as next week. Electronic Signature(s) Signed: 07/20/2015 9:40:28 AM By: Christin Fudge MD, FACS Entered By: Christin Fudge on 07/20/2015 09:40:28 [name] [name] ([id number]) -------------------------------------------------------------------------------- SuperBill Details Patient Name: [name] [name] Date of Service: 07/20/2015 Medical Record Number: [id number] Patient Account Number: [id number] Date of Birth/Sex: [birth year] (74 y.o. 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 Facility Procedures CPT4 Code: [id number] Description: 64680 - WOUND CARE VISIT-LEV 2 EST PT Modifier: Quantity: 1 Physician Procedures CPT4: Description Modifier Quantity Code 3212248 25003 - WC PHYS LEVEL 3 - EST PT 1 ICD-10 Description Diagnosis E11.621 Type 2 diabetes mellitus 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

## 2015-07-27 ENCOUNTER — Encounter: Payer: Medicare Other | Admitting: Surgery

## 2015-07-27 DIAGNOSIS — L97421 Non-pressure chronic ulcer of left heel and midfoot limited to breakdown of skin: Secondary | ICD-10-CM | POA: Diagnosis not present

## 2015-07-28 NOTE — Progress Notes (Signed)
CORIAN, HANDLEY ([id number]) Visit Report for 07/27/2015 Arrival Information Details Patient Name: [name] [name], [name] [name] Date of Service: 07/27/2015 8:15 AM Medical Record Number: [id number] Patient Account Number: [id number] Date of Birth/Sex: May 09, 1941 (74 y.o. Male) Treating RN: Montey Hora Primary Care Physician: Thersa Salt Other Clinician: Referring Physician: Thersa Salt Treating Physician/Extender: Frann Rider in Treatment: 18 Visit Information History Since Last Visit Added or deleted any medications: No Patient Arrived: Ambulatory Any new allergies or adverse reactions: No Arrival Time: 08:28 Had a fall or experienced change in No Accompanied By: self activities of daily living that may affect Transfer Assistance: None risk of falls: Patient Identification Verified: Yes Signs or symptoms of abuse/neglect since last No Secondary Verification Process Yes visito Completed: Hospitalized since last visit: No Patient Has Alerts: Yes Pain Present Now: No Electronic Signature(s) Signed: 07/27/2015 4:16:34 PM By: Montey Hora Entered By: Montey Hora on 07/27/2015 08:28:47 [name] [name] ([id number]) -------------------------------------------------------------------------------- Encounter Discharge Information Details Patient Name: [name] [name] Date of Service: 07/27/2015 8:15 AM Medical Record Number: [id number] Patient Account Number: [id number] Date of Birth/Sex: 04-02-1941 (74 y.o. Male) Treating RN: Montey Hora Primary Care Physician: Thersa Salt Other Clinician: Referring Physician: Thersa Salt Treating Physician/Extender: Frann Rider in Treatment: 57 Encounter Discharge Information Items Discharge Pain Level: 0 Discharge Condition: Stable Ambulatory Status: Ambulatory Discharge Destination: Home Private Transportation: Auto Accompanied By: self Schedule Follow-up Appointment: Yes Medication Reconciliation completed and No provided to  Patient/Care [name]: Clinical Summary of Care: Electronic Signature(s) Signed: 07/27/2015 10:06:25 AM By: Montey Hora Entered By: Montey Hora on 07/27/2015 10:06:25 [name] [name] ([id number]) -------------------------------------------------------------------------------- Lower Extremity Assessment Details Patient Name: [name] [name] Date of Service: 07/27/2015 8:15 AM Medical Record Number: [id number] Patient Account Number: [id number] Date of Birth/Sex: Feb 01, 1941 (74 y.o. Male) Treating RN: Montey Hora Primary Care Physician: Thersa Salt Other Clinician: Referring Physician: Thersa Salt Treating Physician/Extender: Frann Rider in Treatment: 18 Vascular Assessment Claudication: Claudication Assessment [Left:None] Pulses: Posterior Tibial Dorsalis Pedis Palpable: [Left:Yes] Extremity colors, hair growth, and conditions: Extremity Color: [Left:Normal] Hair Growth on Extremity: [Left:Yes] Temperature of Extremity: [Left:Warm] Capillary Refill: [Left:< 3 seconds] Toe Nail Assessment Left: Right: Thick: No Discolored: No Deformed: No Improper Length and Hygiene: No Electronic Signature(s) Signed: 07/27/2015 4:16:34 PM By: Montey Hora Entered By: Montey Hora on 07/27/2015 08:29:15 [name] [name] ([id number]) -------------------------------------------------------------------------------- Multi Wound Chart Details Patient Name: [name] [name] Date of Service: 07/27/2015 8:15 AM Medical Record Number: [id number] Patient Account Number: [id number] Date of Birth/Sex: 03-Oct-1941 (74 y.o. Male) Treating RN: Montey Hora Primary Care Physician: Thersa Salt Other Clinician: Referring Physician: Thersa Salt Treating Physician/Extender: Frann Rider in Treatment: 18 Vital Signs Height(in): 71 Pulse(bpm): 49 Weight(lbs): 180 Blood Pressure 126/61 (mmHg): Body Mass Index(BMI): 25 Temperature(F): 98.1 Respiratory  Rate 16 (breaths/min): Wound Assessments Treatment Notes Electronic Signature(s) Signed: 07/27/2015 4:16:34 PM By: Montey Hora Entered By: Montey Hora on 07/27/2015 08:39:27 [name] [name] ([id number]) -------------------------------------------------------------------------------- [place] Details Patient Name: [name] [name] Date of Service: 07/27/2015 8:15 AM Medical Record Number: [id number] Patient Account Number: [id number] Date of Birth/Sex: March 27, 1941 (73 y.o. Male) Treating RN: Montey Hora Primary Care Physician: Thersa Salt Other Clinician: Referring Physician: Thersa Salt Treating Physician/Extender: Frann Rider in Treatment: 9 Active Inactive Orientation to the Wound Care Program Nursing Diagnoses: Knowledge deficit related to the wound healing center program Goals: Patient/caregiver will verbalize understanding of the [facility] Date Initiated: 03/23/2015 Goal Status: Active Interventions: Provide education on orientation to the wound  center Notes: Wound/Skin Impairment Nursing Diagnoses: Knowledge deficit related to ulceration/compromised skin integrity Goals: Patient/caregiver will verbalize understanding of skin care regimen Date Initiated: 03/23/2015 Goal Status: Active Ulcer/skin breakdown will heal within 14 weeks Date Initiated: 03/23/2015 Goal Status: Active Interventions: Assess patient/caregiver ability to obtain necessary supplies Assess patient/caregiver ability to perform ulcer/skin care regimen upon admission and as needed Assess ulceration(s) every visit Provide education on ulcer and skin care Treatment Activities: Skin care regimen initiated : 07/27/2015 Topical wound management initiated : 07/27/2015 [name] [name], [name] [name] ([id number]) Notes: Electronic Signature(s) Signed: 07/27/2015 4:16:34 PM By: Montey Hora Entered By: Montey Hora on 07/27/2015 41:66:06 [name] [name]  ([id number]) -------------------------------------------------------------------------------- Patient/Caregiver Education Details Patient Name: [name] [name] Date of Service: 07/27/2015 8:15 AM Medical Record Number: [id number] Patient Account Number: [id number] Date of Birth/Gender: 03/20/1941 (74 y.o. Male) Treating RN: Montey Hora Primary Care Physician: Thersa Salt Other Clinician: Referring Physician: Thersa Salt Treating Physician/Extender: Frann Rider in Treatment: 18 Education Assessment Education Provided To: Patient Education Topics Provided Wound/Skin Impairment: Handouts: Other: may change wrap and gauze if needed but leave steri strips and mepitel in place Methods: Explain/Verbal Responses: State content correctly Electronic Signature(s) Signed: 07/27/2015 10:07:12 AM By: Montey Hora Entered By: Montey Hora on 07/27/2015 10:07:11 [name] [name] ([id number]) -------------------------------------------------------------------------------- Wound Assessment Details Patient Name: [name] [name] Date of Service: 07/27/2015 8:15 AM Medical Record Number: [id number] Patient Account Number: [id number] Date of Birth/Sex: 09-Jul-1941 (74 y.o. Male) Treating RN: Montey Hora Primary Care Physician: Thersa Salt Other Clinician: Referring Physician: Thersa Salt Treating Physician/Extender: Frann Rider in Treatment: 18 Wound Status Wound Number: 1 Primary Diabetic Wound/Ulcer of the Lower Etiology: Extremity Wound Location: Left, Lateral Calcaneous Wound Status: Open Wounding Event: Gradually Appeared Date Acquired: 02/12/2015 Weeks Of Treatment: 18 Clustered Wound: No Photos Photo Uploaded By: Montey Hora on 07/27/2015 15:55:39 Wound Measurements Length: (cm) 1.2 Width: (cm) 0.5 Depth: (cm) 0.1 Area: (cm) 0.471 Volume: (cm) 0.047 % Reduction in Area: 46.5% % Reduction in Volume: 46.6% Wound Description Classification: Grade  1 Periwound Skin Texture Texture Color No Abnormalities Noted: No No Abnormalities Noted: No Moisture No Abnormalities Noted: No Treatment Notes Wound #1 (Left, Lateral Calcaneous) 1. Cleansed with: [name] [name] ([id number]) Clean wound with Normal Saline 3. Peri-wound Care: Skin Prep 4. Dressing Applied: Mepitel 5. Secondary Dressing Applied Dry Gauze Notes coband used to secure. darco shoe with peg assist. Epifix 14 mm applied by Dr Con Memos today in clinic Electronic Signature(s) Signed: 07/27/2015 4:16:34 PM By: Montey Hora Entered By: Montey Hora on 07/27/2015 08:55:28 [name] [name] ([id number]) -------------------------------------------------------------------------------- Vitals Details Patient Name: [name] [name] Date of Service: 07/27/2015 8:15 AM Medical Record Number: [id number] Patient Account Number: [id number] Date of Birth/Sex: November 27, 1941 (73 y.o. Male) Treating RN: Montey Hora Primary Care Physician: Thersa Salt Other Clinician: Referring Physician: Thersa Salt Treating Physician/Extender: Frann Rider in Treatment: 18 Vital Signs Time Taken: 08:30 Temperature (F): 98.1 Height (in): 71 Pulse (bpm): 49 Weight (lbs): 180 Respiratory Rate (breaths/min): 16 Body Mass Index (BMI): 25.1 Blood Pressure (mmHg): 126/61 Reference Range: 80 - 120 mg / dl Electronic Signature(s) Signed: 07/27/2015 4:16:34 PM By: Montey Hora Entered By: Montey Hora on 07/27/2015 08:29:50

## 2015-07-28 NOTE — Progress Notes (Signed)
[name], [name] ([id number]) Visit Report for 07/27/2015 Chief Complaint Document Details Patient Name: [name] [name], [name] [name] Date of Service: 07/27/2015 8:15 AM Medical Record Number: [id number] Patient Account Number: [id number] Date of Birth/Sex: [birth year] (73 y.o. Male) Treating RN: [name] [name] Primary Care Physician: [name] [name] Other Clinician: Referring Physician: Thersa [name] Treating Physician/Extender: [name] [name] in Treatment: 33 Information Obtained from: Patient Chief Complaint Patient presents to the wound care center for a consult due non healing wound pleasant 74 year old gentleman who comes with a ulcerated area on the left lateral heel and he's had it for about a month. Electronic Signature(s) Signed: 07/27/2015 9:08:20 AM By: [name] Fudge MD, FACS Entered By: [name] [name] on 07/27/2015 09:08:20 [name] [name] ([id number]) -------------------------------------------------------------------------------- Cellular or Tissue Based Product Details Patient Name: [name] [name] Date of Service: 07/27/2015 8:15 AM Medical Record Number: [id number] Patient Account Number: [id number] Date of Birth/Sex: 08-24-1941 (74 y.o. Male) Treating RN: [name] [name] Primary Care Physician: [name] [name] Other Clinician: Referring Physician: Thersa [name] Treating Physician/Extender: [name] [name] in Treatment: 18 Cellular or Tissue Based Wound #1 Left,Lateral Calcaneous Product Type Applied to: Performed By: Physician [name] [name]., MD Cellular or Tissue Based Epifix Product Type: Time-Out Taken: Yes Location: trunk / arms / legs Wound Size (sq cm): 0.6 Product Size (sq cm): 14 Waste Size (sq cm): 0 Amount of Product Applied (sq cm): 14 Lot #: [id number] Order #: [id number] Expiration Date: 03/11/2020 Fenestrated: No Reconstituted: Yes Solution Type: saline Solution Amount: 106ml Lot #: [id number] Solution Expiration 03/11/2017 Date: Secured: Yes Secured With:  Steri-Strips Dressing Applied: Yes Primary Dressing: mepitel one Procedural Pain: 0 Post Procedural Pain: 0 Response to Treatment: Procedure was tolerated well Electronic Signature(s) Signed: 07/27/2015 9:08:06 AM By: [name] Fudge MD, FACS Entered By: [name] [name] on 07/27/2015 09:08:06 [name] [name] ([id number]) -------------------------------------------------------------------------------- HPI Details Patient Name: [name] [name] Date of Service: 07/27/2015 8:15 AM Medical Record Number: [id number] Patient Account Number: [id number] Date of Birth/Sex: 1941-09-14 (74 y.o. Male) Treating RN: [name] [name] Primary Care Physician: [name] [name] Other Clinician: Referring Physician: Thersa [name] Treating Physician/Extender: [name] [name] in Treatment: 18 History of Present Illness HPI Description: 74 year old gentleman who comes as a self-referral and has been known to be a diabetic on treatment for the last 30 years and takes insulin for this. Developed a callus on the left lateral heel and does not know exactly what type of footwear cause this. Did see his dermatologist but prescribed some medication for him and also applied some Bactroban cream. Able to soften this area and remove some of the dead skin but he now has a definite ulcer there and knowing he's been a diabetic he came as a self-referral for further treatment. He's been a nonsmoker all his life and hasn't had no problems with any arterial disease and has no workup done for this. 04/06/2015 -- he has been doing his dressing on alternate days but sometimes he runs that the dressing doesn't stay on for long. He also informs me that he is going to be out on vacation this weekend and will be back only in 2 weeks. 04/27/2015 -- his blood sugars under good control and he says he's been offloading as much as possible and has no pain whatsoever. 05/04/2015 -- he has been doing very well his blood sugars are well under control  and his offloading and changing of dressing has been very compliant. 05/18/2015 - Due to some family issues he's not been seen back for 2  weeks. he ran out of his Prisma dressing and went and bought a duodenum type of dressing form the pharmacy and has used it for the last 48 hours. He started getting a minimal swelling in his leg and had noticed a change in his wound. 06/08/2015 -- he is back after 2 weeks because of some family commitments and says has been very compliant but is frustrated that his wound has not healed yet. 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 06/29/2015 -- he is here for a wound check after his first application of Epifix Last week. he is doing fine and has no complaints. 07/06/2015 -- he is doing great and is here for a second application of a Epifix. 07/13/2015 -- he has been doing well and is here for a wound check. 07/27/2015 -- he is here for his third application of a Epifix. Electronic Signature(s) Signed: 07/27/2015 9:08:53 AM By: [name] Fudge MD, FACS Entered By: [name] [name] on 07/27/2015 09:08:53 [name] [name] ([name] ([id number]) -------------------------------------------------------------------------------- Physical Exam Details Patient Name: [name] [name] Date of Service: 07/27/2015 8:15 AM Medical Record Number: [id number] Patient Account Number: [id number] Date of Birth/Sex: 1941/08/24 (74 y.o. Male) Treating RN: [name] [name] Primary Care Physician: [name] [name] Other Clinician: Referring Physician: Thersa [name] Treating Physician/Extender: [name] [name] in Treatment: 18 Constitutional . Pulse regular. Respirations normal and unlabored. Afebrile. . Eyes Nonicteric. Reactive to light. Ears, Nose, Mouth, and Throat Lips, teeth, and gums WNL.Marland Kitchen Moist  mucosa without lesions . Neck supple and nontender. No palpable supraclavicular or cervical adenopathy. Normal sized without goiter. Respiratory WNL. No retractions.. Cardiovascular Pedal Pulses WNL. No clubbing, cyanosis or edema. Chest Breasts symmetical and no nipple discharge.. Breast tissue WNL, no masses, lumps, or tenderness.. Lymphatic No adneopathy. No adenopathy. No adenopathy. Musculoskeletal Adexa without tenderness or enlargement.. Digits and nails w/o clubbing, cyanosis, infection, petechiae, ischemia, or inflammatory conditions.. Integumentary (Hair, Skin) No suspicious lesions. No crepitus or fluctuance. No peri-wound warmth or erythema. No masses.Marland Kitchen Psychiatric Judgement and insight Intact.. No evidence of depression, anxiety, or agitation.. Notes The wound is healthy and has good amount of epithelization but is ready for its Application of skin substitute. Electronic Signature(s) Signed: 07/27/2015 9:09:31 AM By: [name] Fudge MD, FACS Entered By: [name] [name] on 07/27/2015 09:09:31 [name] [name] ([id number]) -------------------------------------------------------------------------------- Physician Orders Details Patient Name: [name] [name] Date of Service: 07/27/2015 8:15 AM Medical Record Number: [id number] Patient Account Number: [id number] Date of Birth/Sex: [birth year] (74 y.o. Male) Treating RN: Montey Hora Primary Care Physician: [name] [name] Other Clinician: Referring Physician: Thersa [name] Treating Physician/Extender: [name] [name] in Treatment: 56 Verbal / Phone Orders: Yes Clinician: Montey Hora Read Back and Verified: Yes Diagnosis Coding Wound Cleansing Wound #1 Left,Lateral Calcaneous o Clean wound with Normal Saline. Skin Barriers/Peri-Wound Care Wound #1 Left,Lateral Calcaneous o Skin Prep Primary Wound Dressing Wound #1 Left,Lateral Calcaneous o Dry Gauze - bolster secure with Coban o Mepitel One Dressing Change  Frequency Wound #1 Left,Lateral Calcaneous o Change dressing every week Follow-up Appointments Wound #1 Left,Lateral Calcaneous o Return Appointment in 1 week. Off-Loading Wound #1 Left,Lateral Calcaneous o Open toe surgical shoe with peg assist. Advanced Therapies Wound #1 Left,Lateral Calcaneous o EpiFix application in clinic; including contact layer, fixation with steri strips, dry gauze and cover dressing. Electronic  Signature(s) Signed: 07/27/2015 12:24:58 PM By: [name] Fudge MD, FACS Signed: 07/27/2015 4:16:34 PM By: Waynetta Pean ([id number]) Entered By: Montey Hora on 07/27/2015 09:03:36 [name] [name] ([id number]) -------------------------------------------------------------------------------- Problem List Details Patient Name: [name] [name] Date of Service: 07/27/2015 8:15 AM Medical Record Number: [id number] Patient Account Number: [id number] Date of Birth/Sex: 28-Jan-1941 (74 y.o. 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 Male) Treating RN: [name] [name] Primary Care Physician: [name] [name] Other  Clinician: Referring Physician: Thersa [name] Treating Physician/Extender: [name] [name] in Treatment: 18 Subjective Chief Complaint Information obtained from Patient Patient presents to the wound care center for a consult due non healing wound pleasant 74 year old gentleman who comes with a ulcerated area on the left lateral heel and he's had it for about a month. History of Present Illness (HPI) 74 year old gentleman who comes as a self-referral and has been known to be a diabetic on treatment for the last 30 years and takes insulin for this. Developed a callus on the left lateral heel and does not know exactly what type of footwear cause this. Did see his dermatologist but prescribed some medication for him and also applied some Bactroban cream. Able to soften this area and remove some of the dead skin but he now has a definite ulcer there and knowing he's been a diabetic he came as a self-referral for further treatment. He's been a nonsmoker all his life and hasn't had no problems with any arterial disease and has no workup done for this. 04/06/2015 -- he has been doing his dressing on alternate days but sometimes he runs that the dressing doesn't stay on for long. He also informs me that he is going to be out on vacation this weekend and will be back only in 2 weeks. 04/27/2015 -- his blood sugars under good control and he says he's been offloading as much as possible and has no pain whatsoever. 05/04/2015 -- he has been doing very well his blood sugars are well under control and his offloading and changing of dressing has been very compliant. 05/18/2015 - Due to some family issues he's not been seen back for 2 weeks. he ran out of his Prisma dressing and went and bought a duodenum type of dressing form the pharmacy and has used it for the last 48 hours. He started getting a minimal swelling in his leg and had noticed a change in his wound. 06/08/2015 -- he is back after 2 weeks  because of some family commitments and says has been very compliant but is frustrated that his wound has not healed yet. 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 06/29/2015 -- he is here for a wound  check after his first application of Epifix Last week. he is doing fine and has no complaints. 07/06/2015 -- he is doing great and is here for a second application of a Epifix. 07/13/2015 -- he has been doing well and is here for a wound check. [name] [name], [name] [name] ([id number]) 07/27/2015 -- he is here for his third application of a Epifix. Objective Constitutional Pulse regular. Respirations normal and unlabored. Afebrile. Vitals Time Taken: 8:30 AM, Height: 71 in, Weight: 180 lbs, BMI: 25.1, Temperature: 98.1 F, Pulse: 49 bpm, Respiratory Rate: 16 breaths/min, Blood Pressure: 126/61 mmHg. Eyes Nonicteric. Reactive to light. Ears, Nose, Mouth, and Throat Lips, teeth, and gums WNL.Marland Kitchen Moist mucosa without lesions . Neck supple and nontender. No palpable supraclavicular or cervical adenopathy. Normal sized without goiter. Respiratory WNL. No retractions.. Cardiovascular Pedal Pulses WNL. No clubbing, cyanosis or edema. Chest Breasts symmetical and no nipple discharge.. Breast tissue WNL, no masses, lumps, or tenderness.. Lymphatic No adneopathy. No adenopathy. No adenopathy. Musculoskeletal Adexa without tenderness or enlargement.. Digits and nails w/o clubbing, cyanosis, infection, petechiae, ischemia, or inflammatory conditions.Marland Kitchen Psychiatric Judgement and insight Intact.. No evidence of depression, anxiety, or agitation.. General Notes: The wound is healthy and has good amount of epithelization but is ready for its Application of skin substitute. Integumentary (Hair, Skin) No suspicious lesions. No crepitus or fluctuance.  No peri-wound warmth or erythema. No masses.Marland Kitchen [name] [name], [name] [name] ([id number]) Wound #1 status is Open. Original cause of wound was Gradually Appeared. The wound is located on the Left,Lateral Calcaneous. The wound measures 1.2cm length x 0.5cm width x 0.1cm depth; 0.471cm^2 area and 0.047cm^3 volume. 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 He will offload in the usual fashion and come back and see me next week for wound check. Procedures Wound #1 Wound #1 is a Diabetic Wound/Ulcer of the Lower Extremity located on the Left,Lateral Calcaneous. A skin graft procedure using a bioengineered skin substitute/cellular or tissue based product was performed by Lejla Moeser, Jackson Latino., MD. Epifix was applied and secured with Steri-Strips. 14 sq cm of product was utilized and 0 sq cm was wasted. Post Application, mepitel one was applied. A Time Out was conducted prior to the start of the procedure. The procedure was tolerated well with a pain level of 0 throughout and a pain level of 0 following the procedure. Plan Wound Cleansing: Wound #1 Left,Lateral Calcaneous: Clean wound with Normal Saline. Skin Barriers/Peri-Wound Care: Wound #1 Left,Lateral Calcaneous: Skin Prep [name] [name], [name] [name] ([id number]) Primary Wound Dressing: Wound #1 Left,Lateral Calcaneous: Dry Gauze - bolster secure with Coban Mepitel One Dressing Change Frequency: Wound #1 Left,Lateral Calcaneous: Change dressing every week Follow-up Appointments: Wound #1 Left,Lateral Calcaneous: Return Appointment in 1 week. Off-Loading: Wound #1 Left,Lateral Calcaneous: Open toe surgical shoe with peg assist. Advanced Therapies: Wound #1 Left,Lateral Calcaneous: EpiFix application in clinic;  including contact layer, fixation with steri strips, dry gauze and cover dressing. The third application of a prefix was put in the usual fashion and fixed with a bolster of gauze and a Coban wrap. He will offload in the usual fashion and come back and see me next week for wound check. Electronic Signature(s) Signed: 07/27/2015 9:10:23 AM By: [name] Fudge MD, FACS Entered By: [name] [name] on 07/27/2015 09:10:23  [name] [name], [name] [name] ([id number]) -------------------------------------------------------------------------------- SuperBill Details Patient Name: [name] [name], [name] [name] Date of Service: 07/27/2015 Medical Record Number: [id number] Patient Account Number: [id number] Date of Birth/Sex: 04-17-1941 (73 y.o. 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 to  breakdown of skin CPT4: [id number] - SKIN SUB GRAFT TRNK/ARM/LEG 1 ICD-10 Description Diagnosis E11.621 Type 2 diabetes mellitus 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

## 2015-08-03 ENCOUNTER — Encounter: Payer: Medicare Other | Admitting: Surgery

## 2015-08-03 DIAGNOSIS — L97421 Non-pressure chronic ulcer of left heel and midfoot limited to breakdown of skin: Secondary | ICD-10-CM | POA: Diagnosis not present

## 2015-08-04 NOTE — Progress Notes (Signed)
[name] [name] ([id number]) Visit Report for 08/03/2015 Arrival Information Details Patient Name: [name] [name] Date of Service: 08/03/2015 8:45 AM Medical Record Number: [id number] Patient Account Number: [id number] Date of Birth/Sex: [birth year] (74 y.o. Male) Treating RN: Montey Hora Primary Care Physician: Thersa Salt Other Clinician: Referring Physician: Thersa Salt Treating Physician/Extender: Frann Rider in Treatment: 48 Visit Information History Since Last Visit Added or deleted any medications: No Patient Arrived: Ambulatory Any new allergies or adverse reactions: No Arrival Time: 09:00 Had a fall or experienced change in No Accompanied By: self activities of daily living that may affect Transfer Assistance: None risk of falls: Patient Identification Verified: Yes Signs or symptoms of abuse/neglect since last No Secondary Verification Process Yes visito Completed: Hospitalized since last visit: No Patient Has Alerts: Yes Pain Present Now: No Electronic Signature(s) Signed: 08/03/2015 5:20:03 PM By: Montey Hora Entered By: Montey Hora on 08/03/2015 09:06:27 [name] [name] ([id number]) -------------------------------------------------------------------------------- Clinic Level of Care Assessment Details Patient Name: [name] [name] Date of Service: 08/03/2015 8:45 AM Medical Record Number: [id number] Patient Account Number: [id number] Date of Birth/Sex: [birth year] (74 y.o. Male) Treating RN: Montey Hora Primary Care Physician: Thersa Salt Other Clinician: Referring Physician: Thersa Salt Treating Physician/Extender: Frann Rider in Treatment: 19 Clinic Level of Care Assessment Items TOOL 4 Quantity Score []  - Use when only an EandM is performed on FOLLOW-UP visit 0 ASSESSMENTS - Nursing Assessment / Reassessment X - Reassessment of Co-morbidities (includes updates in patient status) 1 10 X - Reassessment of Adherence to Treatment Plan 1  5 ASSESSMENTS - Wound and Skin Assessment / Reassessment X - Simple Wound Assessment / Reassessment - one wound 1 5 []  - Complex Wound Assessment / Reassessment - multiple wounds 0 []  - Dermatologic / Skin Assessment (not related to wound area) 0 ASSESSMENTS - Focused Assessment []  - Circumferential Edema Measurements - multi extremities 0 []  - Nutritional Assessment / Counseling / Intervention 0 X - Lower Extremity Assessment (monofilament, tuning fork, pulses) 1 5 []  - Peripheral Arterial Disease Assessment (using hand held doppler) 0 ASSESSMENTS - Ostomy and/or Continence Assessment and Care []  - Incontinence Assessment and Management 0 []  - Ostomy Care Assessment and Management (repouching, etc.) 0 PROCESS - Coordination of Care X - Simple Patient / Family Education for ongoing care 1 15 []  - Complex (extensive) Patient / Family Education for ongoing care 0 []  - Staff obtains Programmer, systems, Records, Test Results / Process Orders 0 []  - Staff telephones HHA, Nursing Homes / Clarify orders / etc 0 []  - Routine Transfer to another Facility (non-emergent condition) 0 [name] [name]. ([id number]) []  - Routine Hospital Admission (non-emergent condition) 0 []  - New Admissions / Biomedical engineer / Ordering NPWT, Apligraf, etc. 0 []  - Emergency Hospital Admission (emergent condition) 0 X - Simple Discharge Coordination 1 10 []  - Complex (extensive) Discharge Coordination 0 PROCESS - Special Needs []  - Pediatric / Minor Patient Management 0 []  - Isolation Patient Management 0 []  - Hearing / Language / Visual special needs 0 []  - Assessment of Community assistance (transportation, D/C planning, etc.) 0 []  - Additional assistance / Altered mentation 0 []  - Support Surface(s) Assessment (bed, cushion, seat, etc.) 0 INTERVENTIONS - Wound Cleansing / Measurement X - Simple Wound Cleansing - one wound 1 5 []  - Complex Wound Cleansing - multiple wounds 0 X - Wound Imaging (photographs - any  number of wounds) 1 5 []  - Wound Tracing (instead of photographs) 0 X - Simple Wound Measurement -  one wound 1 5 []  - Complex Wound Measurement - multiple wounds 0 INTERVENTIONS - Wound Dressings X - Small Wound Dressing one or multiple wounds 1 10 []  - Medium Wound Dressing one or multiple wounds 0 []  - Large Wound Dressing one or multiple wounds 0 []  - Application of Medications - topical 0 []  - Application of Medications - injection 0 INTERVENTIONS - Miscellaneous []  - External ear exam 0 [name] [name]. ([id number]) []  - Specimen Collection (cultures, biopsies, blood, body fluids, etc.) 0 []  - Specimen(s) / Culture(s) sent or taken to Lab for analysis 0 []  - Patient Transfer (multiple staff / Harrel Lemon Lift / Similar devices) 0 []  - Simple Staple / Suture removal (25 or less) 0 []  - Complex Staple / Suture removal (26 or more) 0 []  - Hypo / Hyperglycemic Management (close monitor of Blood Glucose) 0 []  - Ankle / Brachial Index (ABI) - do not check if billed separately 0 X - Vital Signs 1 5 Has the patient been seen at the hospital within the last three years: Yes Total Score: 80 Level Of Care: New/Established - Level 3 Electronic Signature(s) Signed: 08/03/2015 5:20:03 PM By: Montey Hora Entered By: Montey Hora on 08/03/2015 09:21:59 [name] [name] ([id number]) -------------------------------------------------------------------------------- Encounter Discharge Information Details Patient Name: [name] [name] Date of Service: 08/03/2015 8:45 AM Medical Record Number: [id number] Patient Account Number: [id number] Date of Birth/Sex: 21-Jun-1941 (74 y.o. Male) Treating RN: Montey Hora Primary Care Physician: Thersa Salt Other Clinician: Referring Physician: Thersa Salt Treating Physician/Extender: Frann Rider in Treatment: 69 Encounter Discharge Information Items Discharge Pain Level: 0 Discharge Condition: Stable Ambulatory Status: Ambulatory Discharge  Destination: Home Private Transportation: Auto Accompanied By: self Schedule Follow-up Appointment: Yes Medication Reconciliation completed and No provided to Patient/Care [name]: Clinical Summary of Care: Electronic Signature(s) Signed: 08/03/2015 5:20:03 PM By: Montey Hora Entered By: Montey Hora on 08/03/2015 09:44:04 [name] [name] ([id number]) -------------------------------------------------------------------------------- Lower Extremity Assessment Details Patient Name: [name] [name] Date of Service: 08/03/2015 8:45 AM Medical Record Number: [id number] Patient Account Number: [id number] Date of Birth/Sex: 02-29-1941 (74 y.o. Male) Treating RN: Montey Hora Primary Care Physician: Thersa Salt Other Clinician: Referring Physician: Thersa Salt Treating Physician/Extender: Frann Rider in Treatment: 19 Vascular Assessment Claudication: Claudication Assessment [Left:None] Pulses: Posterior Tibial Dorsalis Pedis Palpable: [Left:Yes] Extremity colors, hair growth, and conditions: Extremity Color: [Left:Normal] Hair Growth on Extremity: [Left:Yes] Temperature of Extremity: [Left:Warm] Capillary Refill: [Left:< 3 seconds] Electronic Signature(s) Signed: 08/03/2015 5:20:03 PM By: Montey Hora Entered By: Montey Hora on 08/03/2015 09:09:17 [name] [name] ([id number]) -------------------------------------------------------------------------------- Multi Wound Chart Details Patient Name: [name] [name] Date of Service: 08/03/2015 8:45 AM Medical Record Number: [id number] Patient Account Number: [id number] Date of Birth/Sex: 1941-05-21 (74 y.o. Male) Treating RN: Montey Hora Primary Care Physician: Thersa Salt Other Clinician: Referring Physician: Thersa Salt Treating Physician/Extender: Frann Rider in Treatment: 19 Vital Signs Height(in): 71 Pulse(bpm): 52 Weight(lbs): 180 Blood Pressure 125/56 (mmHg): Body Mass Index(BMI):  25 Temperature(F): 98.3 Respiratory Rate 16 (breaths/min): Wound Assessments Treatment Notes Electronic Signature(s) Signed: 08/03/2015 5:20:03 PM By: Montey Hora Entered By: Montey Hora on 08/03/2015 09:11:59 [name] [name] ([id number]) -------------------------------------------------------------------------------- [place] Details Patient Name: [name] [name] Date of Service: 08/03/2015 8:45 AM Medical Record Number: [id number] Patient Account Number: [id number] Date of Birth/Sex: 1941-02-13 (74 y.o. Male) Treating RN: Montey Hora Primary Care Physician: Thersa Salt Other Clinician: Referring Physician: Thersa Salt Treating Physician/Extender: Frann Rider in Treatment: 35 Active Inactive Orientation to the Wound Care  Program Nursing Diagnoses: Knowledge deficit related to the wound healing center program Goals: Patient/caregiver will verbalize understanding of the [place] Program Date Initiated: 03/23/2015 Goal Status: Active Interventions: Provide education on orientation to the wound center Notes: Wound/Skin Impairment Nursing Diagnoses: Knowledge deficit related to ulceration/compromised skin integrity Goals: Patient/caregiver will verbalize understanding of skin care regimen Date Initiated: 03/23/2015 Goal Status: Active Ulcer/skin breakdown will heal within 14 weeks Date Initiated: 03/23/2015 Goal Status: Active Interventions: Assess patient/caregiver ability to obtain necessary supplies Assess patient/caregiver ability to perform ulcer/skin care regimen upon admission and as needed Assess ulceration(s) every visit Provide education on ulcer and skin care Treatment Activities: Skin care regimen initiated : 08/03/2015 Topical wound management initiated : 08/03/2015 EBAN, WEICK ([id number]) Notes: Electronic Signature(s) Signed: 08/03/2015 5:20:03 PM By: Montey Hora Entered By: Montey Hora on 08/03/2015  09:11:19 [name] [name] ([id number]) -------------------------------------------------------------------------------- Patient/Caregiver Education Details Patient Name: [name] [name] Date of Service: 08/03/2015 8:45 AM Medical Record Number: [id number] Patient Account Number: [id number] Date of Birth/Gender: 15-Feb-1941 (74 y.o. Male) Treating RN: Montey Hora Primary Care Physician: Thersa Salt Other Clinician: Referring Physician: Thersa Salt Treating Physician/Extender: Frann Rider in Treatment: 36 Education Assessment Education Provided To: Patient Education Topics Provided Offloading: Handouts: Other: continue offloading Methods: Demonstration, Explain/Verbal Responses: State content correctly Electronic Signature(s) Signed: 08/03/2015 5:20:03 PM By: Montey Hora Entered By: Montey Hora on 08/03/2015 09:44:47 [name] [name] ([id number]) -------------------------------------------------------------------------------- Vitals Details Patient Name: [name] [name] Date of Service: 08/03/2015 8:45 AM Medical Record Number: [id number] Patient Account Number: [id number] Date of Birth/Sex: 1941-09-17 (74 y.o. Male) Treating RN: Montey Hora Primary Care Physician: Thersa Salt Other Clinician: Referring Physician: Thersa Salt Treating Physician/Extender: Frann Rider in Treatment: 19 Vital Signs Time Taken: 09:05 Temperature (F): 98.3 Height (in): 71 Pulse (bpm): 52 Weight (lbs): 180 Respiratory Rate (breaths/min): 16 Body Mass Index (BMI): 25.1 Blood Pressure (mmHg): 125/56 Reference Range: 80 - 120 mg / dl Electronic Signature(s) Signed: 08/03/2015 5:20:03 PM By: Montey Hora Entered By: Montey Hora on 08/03/2015 09:07:13

## 2015-08-04 NOTE — Progress Notes (Signed)
[name], [name] ([id number]) Visit Report for 08/03/2015 Chief Complaint Document Details Patient Name: [name], [name] Date of Service: 08/03/2015 8:45 AM Medical Record Number: [id number] Patient Account Number: [id number] Date of Birth/Sex: 11-Dec-1941 (74 y.o. Male) Treating RN: Montey Hora Primary Care Physician: Thersa Salt Other Clinician: Referring Physician: Thersa Salt Treating Physician/Extender: Frann Rider in Treatment: 49 Information Obtained from: Patient Chief Complaint Patient presents to the wound care center for a consult due non healing wound pleasant 74 year old gentleman who comes with a ulcerated area on the left lateral heel and he's had it for about a month. Electronic Signature(s) Signed: 08/03/2015 9:39:58 AM By: Christin Fudge MD, FACS Entered By: Christin Fudge on 08/03/2015 09:39:58 [name] [name] ([id number]) -------------------------------------------------------------------------------- HPI Details Patient Name: [name] [name] Date of Service: 08/03/2015 8:45 AM Medical Record Number: [id number] Patient Account Number: [id number] Date of Birth/Sex: [birth year] (74 y.o. Male) Treating RN: Montey Hora Primary Care Physician: Thersa Salt Other Clinician: Referring Physician: Thersa Salt Treating Physician/Extender: Frann Rider in Treatment: 19 History of Present Illness HPI Description: 74 year old gentleman who comes as a self-referral and has been known to be a diabetic on treatment for the last 30 years and takes insulin for this. Developed a callus on the left lateral heel and does not know exactly what type of footwear cause this. Did see his dermatologist but prescribed some medication for him and also applied some Bactroban cream. Able to soften this area and remove some of the dead skin but he now has a definite ulcer there and knowing he's been a diabetic he came as a self-referral for further treatment. He's been a nonsmoker  all his life and hasn't had no problems with any arterial disease and has no workup done for this. 04/06/2015 -- he has been doing his dressing on alternate days but sometimes he runs that the dressing doesn't stay on for long. He also informs me that he is going to be out on vacation this weekend and will be back only in 2 weeks. 04/27/2015 -- his blood sugars under good control and he says he's been offloading as much as possible and has no pain whatsoever. 05/04/2015 -- he has been doing very well his blood sugars are well under control and his offloading and changing of dressing has been very compliant. 05/18/2015 - Due to some family issues he's not been seen back for 2 weeks. he ran out of his Prisma dressing and went and bought a duodenum type of dressing form the pharmacy and has used it for the last 48 hours. He started getting a minimal swelling in his leg and had noticed a change in his wound. 06/08/2015 -- he is back after 2 weeks because of some family commitments and says has been very compliant but is frustrated that his wound has not healed yet. 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 06/29/2015 -- he is here for a wound check after his first application of Epifix Last week. he is doing fine and has no complaints. 07/06/2015 -- he is doing great and is here for a second application of a Epifix. 07/13/2015 -- he has been doing well and is here for a wound check. 07/27/2015 -- he is here for his third application of a Epifix. 08/03/2015 -- has some dry skin  and was concerned whether it's a callus but other than that has been doing fine. Electronic Signature(s) Signed: 08/03/2015 9:40:39 AM By: Christin Fudge MD, FACS [name] [name] ([id number]) Entered By: Christin Fudge on 08/03/2015 09:40:39 [name] [name]  ([id number]) -------------------------------------------------------------------------------- Physical Exam Details Patient Name: [name] [name] Date of Service: 08/03/2015 8:45 AM Medical Record Number: [id number] Patient Account Number: [id number] Date of Birth/Sex: 1941/01/29 (74 y.o. Male) Treating RN: Montey Hora Primary Care Physician: Thersa Salt Other Clinician: Referring Physician: Thersa Salt Treating Physician/Extender: Frann Rider in Treatment: 19 Constitutional . Pulse regular. Respirations normal and unlabored. Afebrile. . Eyes Nonicteric. Reactive to light. Ears, Nose, Mouth, and Throat Lips, teeth, and gums WNL.Marland Kitchen Moist mucosa without lesions . Neck supple and nontender. No palpable supraclavicular or cervical adenopathy. Normal sized without goiter. Respiratory WNL. No retractions.. Cardiovascular Pedal Pulses WNL. No clubbing, cyanosis or edema. Chest Breasts symmetical and no nipple discharge.. Breast tissue WNL, no masses, lumps, or tenderness.. Lymphatic No adneopathy. No adenopathy. No adenopathy. Musculoskeletal Adexa without tenderness or enlargement.. Digits and nails w/o clubbing, cyanosis, infection, petechiae, ischemia, or inflammatory conditions.. Integumentary (Hair, Skin) No suspicious lesions. No crepitus or fluctuance. No peri-wound warmth or erythema. No masses.Marland Kitchen Psychiatric Judgement and insight Intact.. No evidence of depression, anxiety, or agitation.. Notes The left heel has the Epifix in place and the dressing is fairly dry. He has some dry skin on the rest of his foot and was concerned about this. Edema much better this week. Electronic Signature(s) Signed: 08/03/2015 9:41:39 AM By: Christin Fudge MD, FACS Entered By: Christin Fudge on 08/03/2015 09:41:38 [name] [name] ([id number]) -------------------------------------------------------------------------------- Physician Orders Details Patient Name: [name] [name] Date of  Service: 08/03/2015 8:45 AM Medical Record Number: [id number] Patient Account Number: [id number] Date of Birth/Sex: October 27, 1941 (74 y.o. Male) Treating RN: Montey Hora Primary Care Physician: Thersa Salt Other Clinician: Referring Physician: Thersa Salt Treating Physician/Extender: Frann Rider in Treatment: 28 Verbal / Phone Orders: Yes Clinician: Montey Hora Read Back and Verified: Yes Diagnosis Coding Wound Cleansing Wound #1 Left,Lateral Calcaneous o Clean wound with Normal Saline. Skin Barriers/Peri-Wound Care Wound #1 Left,Lateral Calcaneous o Skin Prep Primary Wound Dressing Wound #1 Left,Lateral Calcaneous o Dry Gauze - bolster secure with Coban Dressing Change Frequency Wound #1 Left,Lateral Calcaneous o Change dressing every week Follow-up Appointments Wound #1 Left,Lateral Calcaneous o Return Appointment in 1 week. Off-Loading Wound #1 Left,Lateral Calcaneous o Open toe surgical shoe with peg assist. Notes order 23mm Epifix for next week Electronic Signature(s) Signed: 08/03/2015 4:00:25 PM By: Christin Fudge MD, FACS Signed: 08/03/2015 5:20:03 PM By: Montey Hora Entered By: Montey Hora on 08/03/2015 09:20:50 [name] [name] ([id number]) -------------------------------------------------------------------------------- Problem List Details Patient Name: [name] [name] Date of Service: 08/03/2015 8:45 AM Medical Record Number: [id number] Patient Account Number: [id number] Date of Birth/Sex: Jul 13, 1941 (74 y.o. 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 Male) Treating RN: Montey Hora Primary Care Physician: Thersa Salt Other Clinician: Referring Physician: Thersa Salt Treating Physician/Extender: Frann Rider in Treatment: 21 Subjective Chief Complaint Information obtained from Patient Patient presents to the wound care center for a consult due non healing wound pleasant 75 year old gentleman who comes with a ulcerated area on the left lateral heel and he's had it for about a month. History of Present Illness (HPI) 74 year old gentleman who comes as a self-referral and has been known to be a diabetic on treatment for the last 30 years and takes insulin for this. Developed a callus on the left lateral heel and does not know exactly what type of footwear cause this. Did see his dermatologist but prescribed some medication for him and also applied some Bactroban cream. Able to soften this area and remove some of the dead skin but he now has a definite ulcer there and knowing he's been a diabetic he came as a self-referral for further treatment. He's been a nonsmoker all his life and hasn't had no problems with any arterial disease and has no workup done for this. 04/06/2015 -- he has been doing his dressing on alternate days but sometimes he runs that the dressing doesn't stay on for long. He also informs me that he is going to be out on vacation this weekend and will be back only in 2 weeks. 04/27/2015 -- his blood sugars under good control and he says he's  been offloading as much as possible and has no pain whatsoever. 05/04/2015 -- he has been doing very well his blood sugars are well under control and his offloading and changing of dressing has been very compliant. 05/18/2015 - Due to some family issues he's not been seen back for 2 weeks. he ran out of his Prisma dressing and went and bought a duodenum type of dressing form the pharmacy and has used it for the last 48 hours. He started getting a minimal swelling in his leg and had noticed a change in his wound. 06/08/2015 -- he is back after 2 weeks because of some family commitments and says has been very compliant but is frustrated that his wound has not healed yet. 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 06/29/2015 -- he is here for a wound check after his first application of Epifix Last week. he is doing fine and has no complaints. 07/06/2015 -- he is doing great and is here for a second application of a Epifix. 07/13/2015 -- he has been doing well and is here for a wound check. EVANGELOS, PAULINO ([id number]) 07/27/2015 -- he is here for his third application of a Epifix. 08/03/2015 -- has some dry skin and was concerned whether it's a callus but other than that has been doing fine. Objective Constitutional Pulse regular. Respirations normal and unlabored. Afebrile. Vitals Time Taken: 9:05 AM, Height: 71 in, Weight: 180 lbs, BMI: 25.1, Temperature: 98.3 F, Pulse: 52 bpm, Respiratory Rate: 16 breaths/min, Blood Pressure: 125/56 mmHg. Eyes Nonicteric. Reactive to light. Ears, Nose, Mouth, and Throat Lips, teeth, and gums WNL.Marland Kitchen Moist mucosa without lesions . Neck supple and nontender. No palpable supraclavicular or cervical adenopathy. Normal sized without goiter. Respiratory WNL. No  retractions.. Cardiovascular Pedal Pulses WNL. No clubbing, cyanosis or edema. Chest Breasts symmetical and no nipple  discharge.. Breast tissue WNL, no masses, lumps, or tenderness.. Lymphatic No adneopathy. No adenopathy. No adenopathy. Musculoskeletal Adexa without tenderness or enlargement.. Digits and nails w/o clubbing, cyanosis, infection, petechiae, ischemia, or inflammatory conditions.Marland Kitchen Psychiatric Judgement and insight Intact.. No evidence of depression, anxiety, or agitation.. General Notes: The left heel has the Epifix in place and the dressing is fairly dry. He has some dry skin on the rest of his foot and was concerned about this. Edema much better this week. LI, FRAGOSO ([id number]) Integumentary (Hair, Skin) No suspicious lesions. No crepitus or fluctuance. No peri-wound warmth or erythema. No masses.. 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 We will see him back next week for his fourth application of a Epifix. Plan Wound Cleansing: Wound #1 Left,Lateral Calcaneous: Clean wound with Normal Saline. Skin Barriers/Peri-Wound Care: Wound #1 Left,Lateral Calcaneous: Skin Prep Primary Wound Dressing: Wound #1 Left,Lateral Calcaneous: Dry Gauze - bolster secure with Coban Dressing Change Frequency: Wound #1 Left,Lateral Calcaneous: Change dressing every week Follow-up Appointments: Wound #1 Left,Lateral Calcaneous: Return Appointment in 1 week. Off-Loading: Wound #1 Left,Lateral Calcaneous: Open toe surgical shoe with peg assist. General Notes: order 57mm Epifix for next week MATEEN, FRANSSEN. ([id number]) The Epifix is in place and we will continue with a bolster dressing and a light wrap to control his edema. We will see  him back next week for his fourth application of a Epifix. Electronic Signature(s) Signed: 08/03/2015 9:43:01 AM By: Christin Fudge MD, FACS Entered By: Christin Fudge on 08/03/2015 09:43:00 [name] [name] ([id number]) -------------------------------------------------------------------------------- SuperBill Details Patient Name: [name] [name] Date of Service: 08/03/2015 Medical Record Number: [id number] Patient Account Number: [id number] Date of Birth/Sex: 07-01-1941 (74 y.o. 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

## 2015-08-12 ENCOUNTER — Encounter: Payer: Medicare Other | Attending: Surgery | Admitting: Surgery

## 2015-08-12 DIAGNOSIS — L97421 Non-pressure chronic ulcer of left heel and midfoot limited to breakdown of skin: Secondary | ICD-10-CM | POA: Insufficient documentation

## 2015-08-12 DIAGNOSIS — Z794 Long term (current) use of insulin: Secondary | ICD-10-CM | POA: Diagnosis not present

## 2015-08-12 DIAGNOSIS — E11621 Type 2 diabetes mellitus with foot ulcer: Secondary | ICD-10-CM | POA: Diagnosis not present

## 2015-08-12 NOTE — Progress Notes (Signed)
[name] [name], [name] [name] ([id number]) Visit Report for 08/12/2015 Arrival Information Details Patient Name: [name] [name], [name] [name] Date of Service: 08/12/2015 9:30 AM Medical Record Number: [id number] Patient Account Number: [id number] Date of Birth/Sex: 05-19-1941 (73 y.o. Male) Treating RN: [name] [name] Primary Care Physician: [name] [name] Other Clinician: Referring Physician: Thersa [name] Treating Physician/Extender: [name] [name] in Treatment: 20 Visit Information History Since Last Visit Added or deleted any medications: No Patient Arrived: [name] [name] Any new allergies or adverse reactions: No Arrival Time: 09:35 Had a fall or experienced change in No Accompanied [name]: self activities of daily living that may affect Transfer Assistance: Other risk of falls: Patient Identification Verified: Yes Signs or symptoms of abuse/neglect since last No Secondary Verification Process Completed: Yes visito Patient Has Alerts: Yes Hospitalized since last visit: No Has Dressing in Place as Prescribed: Yes Pain Present Now: No Electronic Signature(s) Signed: 08/12/2015 1:30:14 PM [name]: [name] Cool, RN, BSN, Kim RN, BSN Entered [name]: [name] [name] on 08/12/2015 09:37:29 [name] [name] ([id number]) -------------------------------------------------------------------------------- Encounter Discharge Information Details Patient Name: [name] [name] Date of Service: 08/12/2015 9:30 AM Medical Record Number: [id number] Patient Account Number: [id number] Date of Birth/Sex: 03-05-1941 (74 y.o. Male) Treating RN: [name] [name] Primary Care Physician: [name] [name] Other Clinician: Referring Physician: Thersa [name] Treating Physician/Extender: [name] [name] in Treatment: 20 Encounter Discharge Information Items Discharge Pain Level: 0 Discharge Condition: Stable Ambulatory Status: Cane Discharge Destination: Home Private Transportation: Auto Accompanied [name]: self Schedule Follow-up Appointment: Yes Medication  Reconciliation completed and Yes provided to Patient/Care [name] [name]: Patient Clinical Summary of Care: Declined Electronic Signature(s) Signed: 08/12/2015 11:06:13 AM [name]: [name] [name]: [name] [name] on 08/12/2015 11:06:13 [name] [name] ([id number]) -------------------------------------------------------------------------------- Lower Extremity Assessment Details Patient Name: [name] [name] Date of Service: 08/12/2015 9:30 AM Medical Record Number: [id number] Patient Account Number: [id number] Date of Birth/Sex: 11-15-1941 (74 y.o. Male) Treating RN: [name] [name] Primary Care Physician: [name] [name] Other Clinician: Referring Physician: Thersa [name] Treating Physician/Extender: [name] [name] in Treatment: 20 Vascular Assessment Pulses: Posterior Tibial Dorsalis Pedis Palpable: [Left:Yes] Extremity colors, hair growth, and conditions: Extremity Color: [Left:Normal] Hair Growth on Extremity: [Left:Yes] Temperature of Extremity: [Left:Warm] Capillary Refill: [Left:< 3 seconds] Toe Nail Assessment Left: Right: Thick: No Discolored: No Deformed: No Improper Length and Hygiene: No Electronic Signature(s) Signed: 08/12/2015 1:30:14 PM [name]: [name] Cool, RN, BSN, Kim RN, BSN Entered [name]: [name] [name] on 08/12/2015 09:40:10 [name] [name] ([id number]) -------------------------------------------------------------------------------- Multi Wound Chart Details Patient Name: [name] [name] Date of Service: 08/12/2015 9:30 AM Medical Record Number: [id number] Patient Account Number: [id number] Date of Birth/Sex: 05/13/1941 (74 y.o. Male) Treating RN: [name] [name] Primary Care Physician: [name] [name] Other Clinician: Referring Physician: Thersa [name] Treating Physician/Extender: [name] [name] in Treatment: 20 Vital Signs Height(in): 71 Pulse(bpm): 52 Weight(lbs): 180 Blood Pressure 133/66 (mmHg): Body Mass Index(BMI): 25 Temperature(F): 97.8 Respiratory  Rate 18 (breaths/min): Photos: [1:No Photos] [N/A:N/A] Wound Location: [1:Left Calcaneous - Lateral] [N/A:N/A] Wounding Event: [1:Gradually Appeared] [N/A:N/A] Primary Etiology: [1:Diabetic Wound/Ulcer of the Lower Extremity] [N/A:N/A] Comorbid History: [1:Type II Diabetes] [N/A:N/A] Date Acquired: [1:02/12/2015] [N/A:N/A] Weeks of Treatment: [1:20] [N/A:N/A] Wound Status: [1:Open] [N/A:N/A] Measurements L x W x D 1x0.5x0.1 [N/A:N/A] (cm) Area (cm) : [1:0.393] [N/A:N/A] Volume (cm) : [1:0.039] [N/A:N/A] % Reduction in Area: [1:55.30%] [N/A:N/A] % Reduction in Volume: 55.70% [N/A:N/A] Classification: [1:Grade 1] [N/A:N/A] Exudate Amount: [1:Small] [N/A:N/A] Exudate Type: [1:Serous] [N/A:N/A] Exudate Color: [1:amber] [N/A:N/A] Wound Margin: [1:Fibrotic scar, thickened scar] [N/A:N/A] Granulation Amount: [1:Medium (34-66%)] [N/A:N/A] Necrotic Amount: [  1:None Present (0%)] [N/A:N/A] Exposed Structures: [1:Fascia: No Fat: No Tendon: No Muscle: No Joint: No Bone: No] [N/A:N/A] Limited to Skin Breakdown Epithelialization: Medium (34-66%) N/A N/A Periwound Skin Texture: Edema: No N/A N/A Excoriation: No Induration: No Callus: No Crepitus: No Fluctuance: No Friable: No Rash: No Scarring: No Periwound Skin Moist: Yes N/A N/A Moisture: Maceration: No Dry/Scaly: No Periwound Skin Color: Atrophie Blanche: No N/A N/A Cyanosis: No Ecchymosis: No Erythema: No Hemosiderin Staining: No Mottled: No Pallor: No Rubor: No Tenderness on No N/A N/A Palpation: Wound Preparation: Ulcer Cleansing: N/A N/A Rinsed/Irrigated with Saline Topical Anesthetic Applied: None Treatment Notes Electronic Signature(s) Signed: 08/12/2015 1:30:14 PM [name]: [name] Cool, RN, BSN, Kim RN, BSN Entered [name]: [name] [name] on 08/12/2015 09:43:17 [name] [name] ([id number]) -------------------------------------------------------------------------------- [place] Details Patient Name:  [name] [name] Date of Service: 08/12/2015 9:30 AM Medical Record Number: [id number] Patient Account Number: [id number] Date of Birth/Sex: 06-06-1941 (74 y.o. Male) Treating RN: [name] [name] Primary Care Physician: [name] [name] Other Clinician: Referring Physician: Thersa [name] Treating Physician/Extender: [name] [name] in Treatment: 20 Active Inactive Orientation to the Wound Care Program Nursing Diagnoses: Knowledge deficit related to the wound healing center program Goals: Patient/caregiver will verbalize understanding of the [facility] Date Initiated: 03/23/2015 Goal Status: Active Interventions: Provide education on orientation to the wound center Notes: Wound/Skin Impairment Nursing Diagnoses: Knowledge deficit related to ulceration/compromised skin integrity Goals: Patient/caregiver will verbalize understanding of skin care regimen Date Initiated: 03/23/2015 Goal Status: Active Ulcer/skin breakdown will heal within 14 weeks Date Initiated: 03/23/2015 Goal Status: Active Interventions: Assess patient/caregiver ability to obtain necessary supplies Assess patient/caregiver ability to perform ulcer/skin care regimen upon admission and as needed Assess ulceration(s) every visit Provide education on ulcer and skin care Treatment Activities: Skin care regimen initiated : 08/12/2015 Topical wound management initiated : 08/12/2015 [name] [name] ([id number]) Notes: Electronic Signature(s) Signed: 08/12/2015 1:30:14 PM [name]: [name] Cool, RN, BSN, Kim RN, BSN Entered [name]: [name] [name] on 08/12/2015 09:43:11 [name] [name] ([id number]) -------------------------------------------------------------------------------- Patient/Caregiver Education Details Patient Name: [name] [name] Date of Service: 08/12/2015 9:30 AM Medical Record Number: [id number] Patient Account Number: [id number] Date of Birth/Gender: [birth year] (74 y.o. Male) Treating RN: [name] [name] Primary Care  Physician: [name] [name] Other Clinician: Referring Physician: Thersa [name] Treating Physician/Extender: [name] [name] in Treatment: 20 Education Assessment Education Provided To: Patient Education Topics Provided Wound/Skin Impairment: Handouts: Caring for Your Ulcer, Other: continue wound care as prescribed Electronic Signature(s) Signed: 08/12/2015 1:30:14 PM [name]: [name] Cool, RN, BSN, Kim RN, BSN Entered [name]: [name] [name] on 08/12/2015 10:03:41 [name] [name] ([id number]) -------------------------------------------------------------------------------- Wound Assessment Details Patient Name: [name] [name] Date of Service: 08/12/2015 9:30 AM Medical Record Number: [id number] Patient Account Number: [id number] Date of Birth/Sex: 01-28-1941 (74 y.o. Male) Treating RN: [name] [name] Primary Care Physician: [name] [name] Other Clinician: Referring Physician: Thersa [name] Treating Physician/Extender: [name] [name] in Treatment: 20 Wound Status Wound Number: 1 Primary Diabetic Wound/Ulcer of the Lower Etiology: Extremity Wound Location: Left Calcaneous - Lateral Wound Status: Open Wounding Event: Gradually Appeared Comorbid Type II Diabetes Date Acquired: 02/12/2015 History: Weeks Of Treatment: 20 Clustered Wound: No Photos Photo Uploaded [name]: [name] [name] on 08/12/2015 10:21:22 Wound Measurements Length: (cm) 1 Width: (cm) 0.5 Depth: (cm) 0.1 Area: (cm) 0.393 Volume: (cm) 0.039 % Reduction in Area: 55.3% % Reduction in Volume: 55.7% Epithelialization: Medium (34-66%) Wound Description Classification: Grade 1 Wound Margin: Fibrotic scar, thickened scar Exudate Amount: Small  Exudate Type: Serous Exudate Color: amber Wound Bed Granulation Amount: Medium (34-66%) Exposed Structure Necrotic Amount: None Present (0%) Fascia Exposed: No Fat Layer Exposed: No Tendon Exposed: No JALAL, RAUCH. ([id number]) Muscle Exposed: No Joint Exposed: No Bone Exposed:  No Limited to Skin Breakdown Periwound Skin Texture Texture Color No Abnormalities Noted: No No Abnormalities Noted: No Callus: No Atrophie Blanche: No Crepitus: No Cyanosis: No Excoriation: No Ecchymosis: No Fluctuance: No Erythema: No Friable: No Hemosiderin Staining: No Induration: No Mottled: No Localized Edema: No Pallor: No Rash: No Rubor: No Scarring: No Moisture No Abnormalities Noted: No Dry / Scaly: No Maceration: No Moist: Yes Wound Preparation Ulcer Cleansing: Rinsed/Irrigated with Saline Topical Anesthetic Applied: None Treatment Notes Wound #1 (Left, Lateral Calcaneous) 1. Cleansed with: Clean wound with Normal Saline 2. Anesthetic Topical Lidocaine 4% cream to wound bed prior to debridement 5. Secondary Dressing Applied Guaze, ABD and kerlix/Conform Notes coban used to secure. darco shoe with peg assist. Electronic Signature(s) Signed: 08/12/2015 1:30:14 PM [name]: [name] Cool, RN, BSN, Kim RN, BSN Entered [name]: [name] [name] on 08/12/2015 09:42:51 [name] [name] ([id number]) -------------------------------------------------------------------------------- Vitals Details Patient Name: [name] [name] Date of Service: 08/12/2015 9:30 AM Medical Record Number: [id number] Patient Account Number: [id number] Date of Birth/Sex: [birth year] (74 y.o. Male) Treating RN: [name] [name] Primary Care Physician: [name] [name] Other Clinician: Referring Physician: Thersa [name] Treating Physician/Extender: [name] [name] in Treatment: 20 Vital Signs Time Taken: 09:37 Temperature (F): 97.8 Height (in): 71 Pulse (bpm): 52 Weight (lbs): 180 Respiratory Rate (breaths/min): 18 Body Mass Index (BMI): 25.1 Blood Pressure (mmHg): 133/66 Reference Range: 80 - 120 mg / dl Electronic Signature(s) Signed: 08/12/2015 1:30:14 PM [name]: [name] Cool, RN, BSN, Kim RN, BSN Entered [name]: [name] [name] on 08/12/2015 09:38:02

## 2015-08-13 NOTE — Progress Notes (Signed)
LISTON, THUM ([id number]) Visit Report for 08/12/2015 Chief Complaint Document Details Patient Name: [name] [name], [name] [name] Date of Service: 08/12/2015 9:30 AM Medical Record Number: [id number] Patient Account Number: [id number] Date of Birth/Sex: 06-Sep-1941 (73 y.o. Male) Treating RN: Cornell Barman Primary Care Physician: Thersa Salt Other Clinician: Referring Physician: Thersa Salt Treating Physician/Extender: Frann Rider in Treatment: 20 Information Obtained from: Patient Chief Complaint Patient presents to the wound care center for a consult due non healing wound pleasant 74 year old gentleman who comes with a ulcerated area on the left lateral heel and he's had it for about a month. Electronic Signature(s) Signed: 08/12/2015 10:15:22 AM By: Christin Fudge MD, FACS Entered By: Christin Fudge on 08/12/2015 10:15:22 [name] [name] ([id number]) -------------------------------------------------------------------------------- Cellular or Tissue Based Product Details Patient Name: [name] [name] Date of Service: 08/12/2015 9:30 AM Medical Record Number: [id number] Patient Account Number: [id number] Date of Birth/Sex: Apr 15, 1941 (73 y.o. Male) Treating RN: Cornell Barman Primary Care Physician: Thersa Salt Other Clinician: Referring Physician: Thersa Salt Treating Physician/Extender: Frann Rider in Treatment: 20 Cellular or Tissue Based Wound #1 Left,Lateral Calcaneous Product Type Applied to: Performed By: Physician Pat Patrick., MD Cellular or Tissue Based Epifix Product Type: Time-Out Taken: Yes Location: genitalia / hands / feet / multiple digits Wound Size (sq cm): 0.5 Product Size (sq cm): 18 Waste Size (sq cm): 0 Amount of Product Applied (sq cm): 18 Lot #: [id number] Order #: [id number] Expiration Date: 04/10/2020 Fenestrated: No Reconstituted: Yes Solution Type: nacl Solution Amount: 68ml Lot #: [id number] Solution Expiration 04/10/2017 Date: Secured:  No Dressing Applied: Yes Primary Dressing: mepitel one Procedural Pain: 0 Post Procedural Pain: 0 Response to Treatment: Procedure was tolerated well Post Procedure Diagnosis Same as Pre-procedure Electronic Signature(s) Signed: 08/12/2015 10:15:16 AM By: Christin Fudge MD, FACS Entered By: Christin Fudge on 08/12/2015 10:15:16 [name] [name] ([id number]) -------------------------------------------------------------------------------- HPI Details Patient Name: [name] [name] Date of Service: 08/12/2015 9:30 AM Medical Record Number: [id number] Patient Account Number: [id number] Date of Birth/Sex: 1941-01-20 (74 y.o. Male) Treating RN: Cornell Barman Primary Care Physician: Thersa Salt Other Clinician: Referring Physician: Thersa Salt Treating Physician/Extender: Frann Rider in Treatment: 20 History of Present Illness HPI Description: 74 year old gentleman who comes as a self-referral and has been known to be a diabetic on treatment for the last 30 years and takes insulin for this. Developed a callus on the left lateral heel and does not know exactly what type of footwear cause this. Did see his dermatologist but prescribed some medication for him and also applied some Bactroban cream. Able to soften this area and remove some of the dead skin but he now has a definite ulcer there and knowing he's been a diabetic he came as a self-referral for further treatment. He's been a nonsmoker all his life and hasn't had no problems with any arterial disease and has no workup done for this. 04/06/2015 -- he has been doing his dressing on alternate days but sometimes he runs that the dressing doesn't stay on for long. He also informs me that he is going to be out on vacation this weekend and will be back only in 2 weeks. 04/27/2015 -- his blood sugars under good control and he says he's been offloading as much as possible and has no pain whatsoever. 05/04/2015 -- he has been doing very well his  blood sugars are well under control and his offloading and changing of dressing has been very compliant. 05/18/2015 - Due to some family issues he's  not been seen back for 2 weeks. he ran out of his Prisma dressing and went and bought a duodenum type of dressing form the pharmacy and has used it for the last 48 hours. He started getting a minimal swelling in his leg and had noticed a change in his wound. 06/08/2015 -- he is back after 2 weeks because of some family commitments and says has been very compliant but is frustrated that his wound has not healed yet. 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 06/29/2015 -- he is here for a wound check after his first application of Epifix Last week. he is doing fine and has no complaints. 07/06/2015 -- he is doing great and is here for a second application of a Epifix. 07/13/2015 -- he has been doing well and is here for a wound check. 07/27/2015 -- he is here for his third application of a Epifix. 08/03/2015 -- has some dry skin and was concerned whether it's a callus but other than that has been doing fine. 08/12/2015 -- he is here for his fourth application of a Epifix Electronic Signature(s) LINDON, KIEL ([id number]) Signed: 08/12/2015 10:15:46 AM By: Christin Fudge MD, FACS Entered By: Christin Fudge on 08/12/2015 10:15:46 [name] [name] ([id number]) -------------------------------------------------------------------------------- Physical Exam Details Patient Name: [name] [name] Date of Service: 08/12/2015 9:30 AM Medical Record Number: [id number] Patient Account Number: [id number] Date of Birth/Sex: [birth year] (74 y.o. Male) Treating RN: Cornell Barman Primary Care Physician: Thersa Salt Other Clinician: Referring Physician: Thersa Salt Treating Physician/Extender: Frann Rider in Treatment: 20 Constitutional . Pulse regular. Respirations normal and unlabored. Afebrile. . Eyes Nonicteric. Reactive to light. Ears, Nose, Mouth, and Throat Lips, teeth, and gums WNL.Marland Kitchen Moist mucosa without lesions . Neck supple and nontender. No palpable supraclavicular or cervical adenopathy. Normal sized without goiter. Respiratory WNL. No retractions.. Cardiovascular Pedal Pulses WNL. No clubbing, cyanosis or edema. Chest Breasts symmetical and no nipple discharge.. Breast tissue WNL, no masses, lumps, or tenderness.. Lymphatic No adneopathy. No adenopathy. No adenopathy. Musculoskeletal Adexa without tenderness or enlargement.. Digits and nails w/o clubbing, cyanosis, infection, petechiae, ischemia, or inflammatory conditions.. Integumentary (Hair, Skin) No suspicious lesions. No crepitus or fluctuance. No peri-wound warmth or erythema. No masses.Marland Kitchen Psychiatric Judgement and insight Intact.. No evidence of depression, anxiety, or agitation.. Notes The wound on the left heel is looking good with healthy epithelization and pockets of good granulation tissue. He will get his fourth application of a prefix applied today with the usual precautions. Electronic Signature(s) Signed: 08/12/2015 10:16:29 AM By: Christin Fudge MD, FACS Entered By: Christin Fudge on 08/12/2015 10:16:28 [name] [name] ([id number]) -------------------------------------------------------------------------------- Physician Orders Details Patient Name: [name] [name] Date of Service: 08/12/2015 9:30 AM Medical Record Number: [id number] Patient Account Number: [id number] Date of Birth/Sex: 09-14-1941 (74 y.o. Male) Treating RN: Cornell Barman Primary Care Physician: Thersa Salt Other Clinician: Referring Physician: Thersa Salt Treating Physician/Extender: Frann Rider in Treatment: 20 Verbal / Phone Orders: Yes Clinician: Cornell Barman Read Back and Verified: Yes Diagnosis Coding Wound  Cleansing Wound #1 Left,Lateral Calcaneous o Clean wound with Normal Saline. Skin Barriers/Peri-Wound Care Wound #1 Left,Lateral Calcaneous o Skin Prep Primary Wound Dressing Wound #1 Left,Lateral Calcaneous o Dry Gauze - bolster secure with Coban o Mepitel One Dressing Change Frequency Wound #1 Left,Lateral Calcaneous o  Change dressing every week Follow-up Appointments Wound #1 Left,Lateral Calcaneous o Return Appointment in 1 week. Off-Loading Wound #1 Left,Lateral Calcaneous o Open toe surgical shoe with peg assist. Advanced Therapies Wound #1 Left,Lateral Calcaneous o EpiFix application in clinic; including contact layer, fixation with steri strips, dry gauze and cover dressing. Electronic Signature(s) Signed: 08/12/2015 1:30:14 PM By: Gretta Cool RN, BSN, Kim RN, BSN Signed: 08/12/2015 4:57:18 PM By: Christin Fudge MD, FACS [name] [name] ([id number]) Entered By: Gretta Cool RN, BSN, Kim on 08/12/2015 09:51:32 [name] [name] ([id number]) -------------------------------------------------------------------------------- Problem List Details Patient Name: PARAG, DORTON Date of Service: 08/12/2015 9:30 AM Medical Record Number: [id number] Patient Account Number: [id number] Date of Birth/Sex: [birth year] (74 y.o. 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 Male) Treating RN: Cornell Barman Primary Care Physician: Thersa Salt Other Clinician: Referring Physician: Thersa Salt Treating Physician/Extender: Frann Rider in Treatment: 20 Subjective Chief Complaint Information obtained from Patient Patient presents to the wound care center for a consult due non healing wound pleasant 74 year old gentleman who comes with a ulcerated area on the left lateral heel and he's had it for about a month. History of Present Illness (HPI) 74 year old gentleman who comes as a self-referral and has been known to be a diabetic on treatment for the last 30 years and takes insulin for this. Developed a callus on the left lateral heel and does not know exactly what type of footwear cause this. Did see his dermatologist but prescribed some medication for him and also applied some Bactroban cream. Able to soften this area and remove some of the dead skin but he now has a definite ulcer there and knowing he's been a diabetic he came as a self-referral for further treatment. He's been a nonsmoker all his life and hasn't had no problems with any arterial disease and has no workup done for this. 04/06/2015 -- he has been doing his dressing on alternate days but sometimes he runs that the dressing doesn't stay on for long. He also informs me that he is going to be out on vacation this weekend and will be back only in 2 weeks. 04/27/2015 -- his blood sugars under good control and he says he's been offloading as much as possible and has no pain whatsoever. 05/04/2015 -- he has been doing very well his blood sugars are well under control and his offloading and changing of dressing has  been very compliant. 05/18/2015 - Due to some family issues he's not been seen back for 2 weeks. he ran out of his Prisma dressing and went and bought a duodenum type of dressing form the pharmacy and has used it for the last 48 hours. He started getting a minimal swelling in his leg and had noticed a change in his wound. 06/08/2015 -- he is back after 2 weeks because of some family commitments and says has been very  compliant but is frustrated that his wound has not healed yet. 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 06/29/2015 -- he is here for a wound check after his first application of Epifix Last week. he is doing fine and has no complaints. 07/06/2015 -- he is doing great and is here for a second application of a Epifix. 07/13/2015 -- he has been doing well and is here for a wound check. GEE, HABIG ([id number]) 07/27/2015 -- he is here for his third application of a Epifix. 08/03/2015 -- has some dry skin and was concerned whether it's a callus but other than that has been doing fine. 08/12/2015 -- he is here for his fourth application of a Epifix Objective Constitutional Pulse regular. Respirations normal and unlabored. Afebrile. Vitals Time Taken: 9:37 AM, Height: 71 in, Weight: 180 lbs, BMI: 25.1, Temperature: 97.8 F, Pulse: 52 bpm, Respiratory Rate: 18 breaths/min, Blood Pressure: 133/66 mmHg. Eyes Nonicteric. Reactive to light. Ears, Nose, Mouth, and Throat Lips, teeth, and gums WNL.Marland Kitchen Moist mucosa without lesions . Neck supple and nontender. No palpable supraclavicular or cervical adenopathy. Normal sized without goiter. Respiratory WNL. No retractions.. Cardiovascular Pedal Pulses WNL. No clubbing, cyanosis or edema. Chest Breasts symmetical and no nipple discharge.. Breast tissue WNL, no masses,  lumps, or tenderness.. Lymphatic No adneopathy. No adenopathy. No adenopathy. Musculoskeletal Adexa without tenderness or enlargement.. Digits and nails w/o clubbing, cyanosis, infection, petechiae, ischemia, or inflammatory conditions.Marland Kitchen Psychiatric Judgement and insight Intact.. No evidence of depression, anxiety, or agitation.. General Notes: The wound on the left heel is looking good with healthy epithelization and pockets of good granulation tissue. He will get his fourth application of a prefix applied today with the usual precautions. ELIAV, MECHLING ([id number]) Integumentary (Hair, Skin) No suspicious lesions. No crepitus or fluctuance. No peri-wound warmth or erythema. No masses.. Wound #1 status is Open. Original cause of wound was Gradually Appeared. The wound is located on the Left,Lateral Calcaneous. The wound measures 1cm length x 0.5cm width x 0.1cm depth; 0.393cm^2 area and 0.039cm^3 volume. The wound is limited to skin breakdown. There is a small amount of serous drainage noted. The wound margin is fibrotic, thickened scar. There is medium (34-66%) granulation within the wound bed. There is no necrotic tissue within the wound bed. The periwound skin appearance exhibited: Moist. The periwound skin appearance did not exhibit: Callus, Crepitus, Excoriation, Fluctuance, Friable, Induration, Localized Edema, Rash, Scarring, Dry/Scaly, Maceration, Atrophie Blanche, Cyanosis, Ecchymosis, Hemosiderin Staining, Mottled, Pallor, Rubor, Erythema. 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 He is going to be away for a week and a half due to being out of town and we have discussed wound care in great detail and he will come back to see  [place] in about a 10 day period. Procedures Wound #1 Wound #1 is a Diabetic Wound/Ulcer of the Lower Extremity located on the Left,Lateral Calcaneous. A skin graft procedure using a bioengineered skin substitute/cellular or tissue based product was performed by Pat Patrick., MD. Epifix was applied and was not secured. 18 sq cm of  product was utilized and 0 sq cm was wasted. Post Application, mepitel one was applied. A Time Out was conducted prior to the start of the procedure. The procedure was tolerated well with a pain level of 0 throughout and a pain level of 0 following the procedure. Post procedure Diagnosis Wound #1: Same as Pre-Procedure . KARLON, SCHLAFER ([id number]) Plan Wound Cleansing: Wound #1 Left,Lateral Calcaneous: Clean wound with Normal Saline. Skin Barriers/Peri-Wound Care: Wound #1 Left,Lateral Calcaneous: Skin Prep Primary Wound Dressing: Wound #1 Left,Lateral Calcaneous: Dry Gauze - bolster secure with Coban Mepitel One Dressing Change Frequency: Wound #1 Left,Lateral Calcaneous: Change dressing every week Follow-up Appointments: Wound #1 Left,Lateral Calcaneous: Return Appointment in 1 week. Off-Loading: Wound #1 Left,Lateral Calcaneous: Open toe surgical shoe with peg assist. Advanced Therapies: Wound #1 Left,Lateral Calcaneous: EpiFix application in clinic; including contact layer, fixation with steri strips, dry gauze and cover dressing. With the usual precautions we have applied the fourth application of a Epifix and he has a bolster and a bordered foam. He is going to be away for a week and a half due to being out of town and we have discussed wound care in great detail and he will come back to see [place] in about a 10 day period. Electronic Signature(s) Signed: 08/12/2015 10:18:52 AM By: Christin Fudge MD, FACS Entered By: Christin Fudge on 08/12/2015 10:18:52 [name] [name]  ([id number]) -------------------------------------------------------------------------------- SuperBill Details Patient Name: [name] [name] Date of Service: 08/12/2015 Medical Record Number: [id number] Patient Account Number: [id number] Date of Birth/Sex: May 14, 1941 (74 y.o. 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 Facility Procedures CPT4: Description Modifier Quantity Code [id number] (Facility Use Only) R4270 o Epifix o Per 1 SQ CM 18 CPT4: [id number] - SKIN SUB GRAFT FACE/NK/HF/G 1 ICD-10 Description Diagnosis E11.621 Type 2 diabetes mellitus with foot ulcer 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

## 2015-08-24 ENCOUNTER — Encounter: Payer: Medicare Other | Admitting: Surgery

## 2015-08-24 DIAGNOSIS — L97421 Non-pressure chronic ulcer of left heel and midfoot limited to breakdown of skin: Secondary | ICD-10-CM | POA: Diagnosis not present

## 2015-08-25 NOTE — Progress Notes (Signed)
STONY, STEGMANN ([id number]) Visit Report for 08/24/2015 Chief Complaint Document Details Patient Name: [name] [name], [name] [name] Date of Service: 08/24/2015 8:45 AM Medical Record Number: [id number] Patient Account Number: [id number] Date of Birth/Sex: 1941-10-26 (74 y.o. Male) Treating RN: Montey Hora Primary Care Physician: Thersa Salt Other Clinician: Referring Physician: Thersa Salt Treating Physician/Extender: Frann Rider in Treatment: 22 Information Obtained from: Patient Chief Complaint Patient presents to the wound care center for a consult due non healing wound pleasant 74 year old gentleman who comes with a ulcerated area on the left lateral heel and he's had it for about a month. Electronic Signature(s) Signed: 08/24/2015 9:44:39 AM By: Christin Fudge MD, FACS Entered By: Christin Fudge on 08/24/2015 09:44:39 [name] [name] ([id number]) -------------------------------------------------------------------------------- Cellular or Tissue Based Product Details Patient Name: [name] [name] Date of Service: 08/24/2015 8:45 AM Medical Record Number: [id number] Patient Account Number: [id number] Date of Birth/Sex: 11/14/1941 (74 y.o. Male) Treating RN: Montey Hora Primary Care Physician: Thersa Salt Other Clinician: Referring Physician: Thersa Salt Treating Physician/Extender: Frann Rider in Treatment: 22 Cellular or Tissue Based Wound #1 Left,Lateral Calcaneous Product Type Applied to: Performed By: Physician Pat Patrick., MD Cellular or Tissue Based Epifix Product Type: Time-Out Taken: Yes Location: trunk / arms / legs Wound Size (sq cm): 2.21 Product Size (sq cm): 4 Waste Size (sq cm): 2 Waste Reason: wound size Amount of Product Applied (sq cm): 2 Lot #: [id number] Order #: [id number] Expiration Date: 03/11/2020 Fenestrated: No Reconstituted: Yes Solution Type: saline Solution Amount: 62ml Lot #: [id number] Solution Expiration 04/10/2017 Date: Secured:  Yes Secured With: Steri-Strips Dressing Applied: Yes Primary Dressing: mepitel Procedural Pain: 0 Post Procedural Pain: 0 Response to Treatment: Procedure was tolerated well Post Procedure Diagnosis Same as Pre-procedure Electronic Signature(s) Signed: 08/24/2015 9:44:25 AM By: Christin Fudge MD, FACS Entered By: Christin Fudge on 08/24/2015 09:44:25 [name] [name] ([id number] Rogue, Shey W. ([id number]) -------------------------------------------------------------------------------- HPI Details Patient Name: [name] [name] Date of Service: 08/24/2015 8:45 AM Medical Record Number: [id number] Patient Account Number: [id number] Date of Birth/Sex: 30-Aug-1941 (74 y.o. Male) Treating RN: Montey Hora Primary Care Physician: Thersa Salt Other Clinician: Referring Physician: Thersa Salt Treating Physician/Extender: Frann Rider in Treatment: 22 History of Present Illness HPI Description: 74 year old gentleman who comes as a self-referral and has been known to be a diabetic on treatment for the last 30 years and takes insulin for this. Developed a callus on the left lateral heel and does not know exactly what type of footwear cause this. Did see his dermatologist but prescribed some medication for him and also applied some Bactroban cream. Able to soften this area and remove some of the dead skin but he now has a definite ulcer there and knowing he's been a diabetic he came as a self-referral for further treatment. He's been a nonsmoker all his life and hasn't had no problems with any arterial disease and has no workup done for this. 04/06/2015 -- he has been doing his dressing on alternate days but sometimes he runs that the dressing doesn't stay on for long. He also informs me that he is going to be out on vacation this weekend and will be back only in 2 weeks. 04/27/2015 -- his blood sugars under good control and he says he's been offloading as much as possible and has no pain  whatsoever. 05/04/2015 -- he has been doing very well his blood sugars are well under control and his offloading and changing of dressing has been very compliant. 05/18/2015 -  Due to some family issues he's not been seen back for 2 weeks. he ran out of his Prisma dressing and went and bought a duodenum type of dressing form the pharmacy and has used it for the last 48 hours. He started getting a minimal swelling in his leg and had noticed a change in his wound. 06/08/2015 -- he is back after 2 weeks because of some family commitments and says has been very compliant but is frustrated that his wound has not healed yet. 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 06/29/2015 -- he is here for a wound check after his first application of Epifix Last week. he is doing fine and has no complaints. 07/06/2015 -- he is doing great and is here for a second application of a Epifix. 07/13/2015 -- he has been doing well and is here for a wound check. 07/27/2015 -- he is here for his third application of a Epifix. 08/03/2015 -- has some dry skin and was concerned whether it's a callus but other than that has been doing fine. 08/12/2015 -- he is here for his fourth application of a Epifix 08/24/2015 - today is his fifth and final application of a Epifix. PROSPERO, MAHNKE ([id number]) Electronic Signature(s) Signed: 08/24/2015 9:45:51 AM By: Christin Fudge MD, FACS Entered By: Christin Fudge on 08/24/2015 09:45:51 [name] [name] ([id number]) -------------------------------------------------------------------------------- Physical Exam Details Patient Name: [name] [name] Date of Service: 08/24/2015 8:45 AM Medical Record Number: [id number] Patient Account Number: [id number] Date of Birth/Sex: 1941-09-15 (74 y.o. Male) Treating RN: Montey Hora Primary Care Physician: Thersa Salt Other Clinician: Referring Physician: Thersa Salt Treating Physician/Extender: Frann Rider in Treatment: 22 Constitutional . Pulse regular. Respirations normal and unlabored. Afebrile. . Eyes Nonicteric. Reactive to light. Ears, Nose, Mouth, and Throat Lips, teeth, and gums WNL.Marland Kitchen Moist mucosa without lesions . Neck supple and nontender. No palpable supraclavicular or cervical adenopathy. Normal sized without goiter. Respiratory WNL. No retractions.. Cardiovascular Pedal Pulses WNL. No clubbing, cyanosis or edema. Lymphatic No adneopathy. No adenopathy. No adenopathy. Musculoskeletal Adexa without tenderness or enlargement.. Digits and nails w/o clubbing, cyanosis, infection, petechiae, ischemia, or inflammatory conditions.. Integumentary (Hair, Skin) No suspicious lesions. No crepitus or fluctuance. No peri-wound warmth or erythema. No masses.Marland Kitchen Psychiatric Judgement and insight Intact.. No evidence of depression, anxiety, or agitation.. Notes overall the wound is looking good with healthy epithelialization on the inferior part of the wound. He also has healthy granulation tissue superiorly peer Electronic Signature(s) Signed: 08/24/2015 9:46:31 AM By: Christin Fudge MD, FACS Entered By: Christin Fudge on 08/24/2015 09:46:31 [name] [name] ([id number]) -------------------------------------------------------------------------------- Physician Orders Details Patient Name: [name] [name] Date of Service: 08/24/2015 8:45 AM Medical Record Number: [id number] Patient Account Number: [id number] Date of Birth/Sex: 07/22/1941 (74 y.o. Male) Treating RN: Montey Hora Primary Care Physician: Thersa Salt Other Clinician: Referring Physician: Thersa Salt Treating Physician/Extender: Frann Rider in Treatment: 13 Verbal / Phone Orders: Yes Clinician: Montey Hora Read Back and Verified: Yes Diagnosis Coding Wound  Cleansing Wound #1 Left,Lateral Calcaneous o Clean wound with Normal Saline. Skin Barriers/Peri-Wound Care Wound #1 Left,Lateral Calcaneous o Skin Prep Primary Wound Dressing Wound #1 Left,Lateral Calcaneous o Dry Gauze - bolster secure with Coban o Mepitel One Dressing Change Frequency Wound #1 Left,Lateral Calcaneous o Change dressing every week Follow-up Appointments  Wound #1 Left,Lateral Calcaneous o Return Appointment in 1 week. Off-Loading Wound #1 Left,Lateral Calcaneous o Open toe surgical shoe with peg assist. Advanced Therapies Wound #1 Left,Lateral Calcaneous o EpiFix application in clinic; including contact layer, fixation with steri strips, dry gauze and cover dressing. Electronic Signature(s) Signed: 08/24/2015 4:28:23 PM By: Christin Fudge MD, FACS Signed: 08/24/2015 4:50:36 PM By: Waynetta Pean ([id number]) Entered By: Montey Hora on 08/24/2015 09:28:07 [name] [name] ([id number]) -------------------------------------------------------------------------------- Problem List Details Patient Name: [name] [name] Date of Service: 08/24/2015 8:45 AM Medical Record Number: [id number] Patient Account Number: [id number] Date of Birth/Sex: Jul 04, 1941 (74 y.o. 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 Male) Treating RN: Montey Hora Primary Care Physician: Thersa Salt Other Clinician: Referring Physician: Thersa Salt Treating Physician/Extender: Frann Rider in Treatment: 22 Subjective Chief Complaint Information obtained from Patient Patient presents to the wound care center for a consult due non healing wound pleasant 74 year old gentleman who comes with a ulcerated area on the left lateral heel and he's had it for about a month. History of Present Illness (HPI) 74 year old gentleman who comes as a self-referral and has been known to be a diabetic on treatment for the last 30 years and takes insulin for this. Developed a callus on the left lateral heel and does not know exactly what type of footwear cause this. Did see his dermatologist but prescribed some medication for him and also applied some Bactroban cream. Able to soften this area and remove some of the dead skin but he now has a definite ulcer there and knowing he's been a diabetic he came as a self-referral for further treatment. He's been a nonsmoker all his life and hasn't had no problems with any arterial disease and has no workup done for this. 04/06/2015 -- he has been doing his dressing on alternate days but sometimes he runs that the dressing doesn't stay on for long. He also informs me that he is going to be out on vacation this weekend and will be back only in 2 weeks. 04/27/2015 -- his blood sugars under good control and he says he's been offloading as much as possible and has no pain whatsoever. 05/04/2015 -- he has been doing very well his blood sugars are well under control and his offloading and changing of dressing  has been very compliant. 05/18/2015 - Due to some family issues he's not been seen back for 2 weeks. he ran out of his Prisma dressing and went and bought a duodenum type of dressing form the pharmacy and has used it for the last 48 hours. He started getting a minimal swelling in his leg and had noticed a change in his wound. 06/08/2015 -- he is back after 2 weeks because of some family commitments and says has been very compliant but is frustrated that his wound has not healed yet. 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 06/29/2015 -- he is here for a wound check after his first application of Epifix Last week. he is doing fine and has no complaints. 07/06/2015 -- he is doing great and is here for a second application of a Epifix. 07/13/2015 -- he has been doing well and is here for a wound check. JALENE, LACKO ([id number]) 07/27/2015 -- he is here for his third application of a Epifix. 08/03/2015 -- has some dry skin and was concerned whether it's a callus but other than that has been doing fine. 08/12/2015 -- he is here for his fourth application of a Epifix 08/24/2015 - today is his fifth and final application of a Epifix. Objective Constitutional Pulse regular. Respirations normal and unlabored. Afebrile. Vitals Time Taken: 9:00 AM, Height: 71 in, Weight: 180 lbs, BMI: 25.1, Temperature: 97.9 F, Pulse: 53 bpm, Respiratory Rate: 18 breaths/min, Blood Pressure: 126/57 mmHg. Eyes Nonicteric. Reactive to light. Ears, Nose, Mouth, and Throat Lips, teeth, and gums WNL.Marland Kitchen Moist mucosa without lesions . Neck supple and nontender. No palpable supraclavicular or cervical adenopathy. Normal sized without goiter. Respiratory WNL. No retractions.. Cardiovascular Pedal Pulses WNL. No clubbing, cyanosis or  edema. Lymphatic No adneopathy. No adenopathy. No adenopathy. Musculoskeletal Adexa without tenderness or enlargement.. Digits and nails w/o clubbing, cyanosis, infection, petechiae, ischemia, or inflammatory conditions.Marland Kitchen Psychiatric Judgement and insight Intact.. No evidence of depression, anxiety, or agitation.. General Notes: overall the wound is looking good with healthy epithelialization on the inferior part of the wound. He also has healthy granulation tissue superiorly peer JADIE, COMAS ([id number]) Integumentary (Hair, Skin) No suspicious lesions. No crepitus or fluctuance. No peri-wound warmth or erythema. No masses.. Wound #1 status is Open. Original cause of wound was Gradually Appeared. The wound is located on the Left,Lateral Calcaneous. The wound measures 1.7cm length x 1.3cm width x 0.1cm depth; 1.736cm^2 area and 0.174cm^3 volume. The wound is limited to skin breakdown. There is no tunneling or undermining noted. There is a small amount of serous drainage noted. The wound margin is fibrotic, thickened scar. There is large (67-100%) granulation within the wound bed. There is no necrotic tissue within the wound bed. The periwound skin appearance exhibited: Moist. The periwound skin appearance did not exhibit: Callus, Crepitus, Excoriation, Fluctuance, Friable, Induration, Localized Edema, Rash, Scarring, Dry/Scaly, Maceration, Atrophie Blanche, Cyanosis, Ecchymosis, Hemosiderin Staining, Mottled, Pallor, Rubor, Erythema. 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 He will come back and see me next week for a wound check. Procedures Wound #1 Wound #1 is a Diabetic Wound/Ulcer of the Lower Extremity located on the Left,Lateral  Calcaneous. A skin graft procedure using a bioengineered skin substitute/cellular or tissue based product was performed by Colbi Schiltz, Jackson Latino., MD. Epifix was applied and secured with Steri-Strips. 2 sq cm of product was utilized and 2 sq cm was wasted due to wound size. Post Application, mepitel was applied. A Time Out was conducted prior to the start of the procedure. The procedure was tolerated well with a pain level of 0 throughout and a pain level of 0  following the procedure. Post procedure Diagnosis Wound #1: Same as Pre-Procedure . TEMITOPE, GRIFFING ([id number]) Plan Wound Cleansing: Wound #1 Left,Lateral Calcaneous: Clean wound with Normal Saline. Skin Barriers/Peri-Wound Care: Wound #1 Left,Lateral Calcaneous: Skin Prep Primary Wound Dressing: Wound #1 Left,Lateral Calcaneous: Dry Gauze - bolster secure with Coban Mepitel One Dressing Change Frequency: Wound #1 Left,Lateral Calcaneous: Change dressing every week Follow-up Appointments: Wound #1 Left,Lateral Calcaneous: Return Appointment in 1 week. Off-Loading: Wound #1 Left,Lateral Calcaneous: Open toe surgical shoe with peg assist. Advanced Therapies: Wound #1 Left,Lateral Calcaneous: EpiFix application in clinic; including contact layer, fixation with steri strips, dry gauze and cover dressing. I have applied a Epifix in the usual fashion and bolstered it in place and applied an appropriate bandage. He will come back and see me next week for a wound check. Electronic Signature(s) Signed: 08/24/2015 9:47:24 AM By: Christin Fudge MD, FACS Entered By: Christin Fudge on 08/24/2015 09:47:24 [name] [name] ([id number]) -------------------------------------------------------------------------------- SuperBill Details Patient Name: [name] [name] Date of Service: 08/24/2015 Medical Record Number: [id number] Patient Account Number: [id number] Date of Birth/Sex: 09-13-1941 (74 y.o. 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 Facility Procedures CPT4: Description Modifier Quantity Code [id number] (Facility Use Only) Z1245 o Epifix o Per 1 SQ CM 4 CPT4: [id number] - SKIN SUB GRAFT TRNK/ARM/LEG 1 ICD-10 Description Diagnosis E11.621 Type 2 diabetes mellitus with foot ulcer 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

## 2015-08-25 NOTE — Progress Notes (Signed)
THERMON, ZULAUF ([id number]) Visit Report for 08/24/2015 Arrival Information Details Patient Name: [name] [name], [name] [name] Date of Service: 08/24/2015 8:45 AM Medical Record Number: [id number] Patient Account Number: [id number] Date of Birth/Sex: May 17, 1941 (74 y.o. Male) Treating RN: Montey Hora Primary Care Physician: Thersa Salt Other Clinician: Referring Physician: Thersa Salt Treating Physician/Extender: Frann Rider in Treatment: 13 Visit Information History Since Last Visit Added or deleted any medications: No Patient Arrived: [name] [name] Any new allergies or adverse reactions: No Arrival Time: 09:00 Had a fall or experienced change in No Accompanied By: self activities of daily living that may affect Transfer Assistance: None risk of falls: Patient Identification Verified: Yes Signs or symptoms of abuse/neglect since last No Secondary Verification Process Completed: Yes visito Patient Has Alerts: Yes Hospitalized since last visit: No Pain Present Now: No Electronic Signature(s) Signed: 08/24/2015 4:50:36 PM By: Montey Hora Entered By: Montey Hora on 08/24/2015 09:00:38 [name] [name] ([id number]) -------------------------------------------------------------------------------- Encounter Discharge Information Details Patient Name: [name] [name] Date of Service: 08/24/2015 8:45 AM Medical Record Number: [id number] Patient Account Number: [id number] Date of Birth/Sex: 11-13-1941 (74 y.o. Male) Treating RN: Montey Hora Primary Care Physician: Thersa Salt Other Clinician: Referring Physician: Thersa Salt Treating Physician/Extender: Frann Rider in Treatment: 61 Encounter Discharge Information Items Discharge Pain Level: 0 Discharge Condition: Stable Ambulatory Status: Cane Discharge Destination: Home Transportation: Private Auto Accompanied By: self Schedule Follow-up Appointment: Yes Medication Reconciliation completed No and provided to Patient/Care  Chia Mowers: Provided on Clinical Summary of Care: 08/24/2015 Form Type Recipient Paper Patient RO Electronic Signature(s) Signed: 08/24/2015 9:43:54 AM By: Ruthine Dose Entered By: Ruthine Dose on 08/24/2015 09:43:54 [name] [name] ([id number]) -------------------------------------------------------------------------------- Lower Extremity Assessment Details Patient Name: [name] [name] Date of Service: 08/24/2015 8:45 AM Medical Record Number: [id number] Patient Account Number: [id number] Date of Birth/Sex: Jun 10, 1941 (74 y.o. Male) Treating RN: Montey Hora Primary Care Physician: Thersa Salt Other Clinician: Referring Physician: Thersa Salt Treating Physician/Extender: Frann Rider in Treatment: 22 Vascular Assessment Pulses: Posterior Tibial Dorsalis Pedis Palpable: [Left:Yes] Extremity colors, hair growth, and conditions: Extremity Color: [Left:Normal] Hair Growth on Extremity: [Left:Yes] Temperature of Extremity: [Left:Warm] Capillary Refill: [Left:< 3 seconds] Toe Nail Assessment Left: Right: Thick: No Discolored: No Deformed: No Improper Length and Hygiene: No Electronic Signature(s) Signed: 08/24/2015 4:50:36 PM By: Montey Hora Entered By: Montey Hora on 08/24/2015 09:04:28 [name] [name] ([id number]) -------------------------------------------------------------------------------- Multi Wound Chart Details Patient Name: [name] [name] Date of Service: 08/24/2015 8:45 AM Medical Record Number: [id number] Patient Account Number: [id number] Date of Birth/Sex: 30-Oct-1941 (74 y.o. Male) Treating RN: Montey Hora Primary Care Physician: Thersa Salt Other Clinician: Referring Physician: Thersa Salt Treating Physician/Extender: Frann Rider in Treatment: 22 Vital Signs Height(in): 71 Pulse(bpm): 53 Weight(lbs): 180 Blood Pressure 126/57 (mmHg): Body Mass Index(BMI): 25 Temperature(F): 97.9 Respiratory Rate 18 (breaths/min): Photos:  [1:No Photos] [N/A:N/A] Wound Location: [1:Left Calcaneous - Lateral] [N/A:N/A] Wounding Event: [1:Gradually Appeared] [N/A:N/A] Primary Etiology: [1:Diabetic Wound/Ulcer of the Lower Extremity] [N/A:N/A] Comorbid History: [1:Type II Diabetes] [N/A:N/A] Date Acquired: [1:02/12/2015] [N/A:N/A] Weeks of Treatment: [1:22] [N/A:N/A] Wound Status: [1:Open] [N/A:N/A] Measurements L x W x D 1.7x1.3x0.1 [N/A:N/A] (cm) Area (cm) : [1:1.736] [N/A:N/A] Volume (cm) : [1:0.174] [N/A:N/A] % Reduction in Area: [1:-97.30%] [N/A:N/A] % Reduction in Volume: -97.70% [N/A:N/A] Classification: [1:Grade 1] [N/A:N/A] Exudate Amount: [1:Small] [N/A:N/A] Exudate Type: [1:Serous] [N/A:N/A] Exudate Color: [1:amber] [N/A:N/A] Wound Margin: [1:Fibrotic scar, thickened scar] [N/A:N/A] Granulation Amount: [1:Large (67-100%)] [N/A:N/A] Necrotic Amount: [1:None Present (0%)] [N/A:N/A] Exposed Structures: [1:Fascia: No Fat: No Tendon: No  Muscle: No Joint: No Bone: No] [N/A:N/A] Limited to Skin Breakdown Epithelialization: Medium (34-66%) N/A N/A Periwound Skin Texture: Edema: No N/A N/A Excoriation: No Induration: No Callus: No Crepitus: No Fluctuance: No Friable: No Rash: No Scarring: No Periwound Skin Moist: Yes N/A N/A Moisture: Maceration: No Dry/Scaly: No Periwound Skin Color: Atrophie Blanche: No N/A N/A Cyanosis: No Ecchymosis: No Erythema: No Hemosiderin Staining: No Mottled: No Pallor: No Rubor: No Tenderness on No N/A N/A Palpation: Wound Preparation: Ulcer Cleansing: N/A N/A Rinsed/Irrigated with Saline Topical Anesthetic Applied: Other: lidocaine 4% Treatment Notes Electronic Signature(s) Signed: 08/24/2015 4:50:36 PM By: Montey Hora Entered By: Montey Hora on 08/24/2015 09:09:50 [name] [name] ([id number]) -------------------------------------------------------------------------------- [place] Details Patient Name: [name] [name] Date of  Service: 08/24/2015 8:45 AM Medical Record Number: [id number] Patient Account Number: [id number] Date of Birth/Sex: 05-Jul-1941 (74 y.o. Male) Treating RN: Montey Hora Primary Care Physician: Thersa Salt Other Clinician: Referring Physician: Thersa Salt Treating Physician/Extender: Frann Rider in Treatment: 22 Active Inactive Orientation to the Wound Care Program Nursing Diagnoses: Knowledge deficit related to the wound healing center program Goals: Patient/caregiver will verbalize understanding of the [place] Program Date Initiated: 03/23/2015 Goal Status: Active Interventions: Provide education on orientation to the wound center Notes: Wound/Skin Impairment Nursing Diagnoses: Knowledge deficit related to ulceration/compromised skin integrity Goals: Patient/caregiver will verbalize understanding of skin care regimen Date Initiated: 03/23/2015 Goal Status: Active Ulcer/skin breakdown will heal within 14 weeks Date Initiated: 03/23/2015 Goal Status: Active Interventions: Assess patient/caregiver ability to obtain necessary supplies Assess patient/caregiver ability to perform ulcer/skin care regimen upon admission and as needed Assess ulceration(s) every visit Provide education on ulcer and skin care Treatment Activities: Skin care regimen initiated : 08/24/2015 Topical wound management initiated : 08/24/2015 RODOLPH, HAGEMANN ([id number]) Notes: Electronic Signature(s) Signed: 08/24/2015 4:50:36 PM By: Montey Hora Entered By: Montey Hora on 08/24/2015 08:65:78 [name] [name] ([id number]) -------------------------------------------------------------------------------- Patient/Caregiver Education Details Patient Name: [name] [name] Date of Service: 08/24/2015 8:45 AM Medical Record Number: [id number] Patient Account Number: [id number] Date of Birth/Gender: [birth year] (74 y.o. Male) Treating RN: Montey Hora Primary Care Physician: Thersa Salt Other  Clinician: Referring Physician: Thersa Salt Treating Physician/Extender: Frann Rider in Treatment: 22 Education Assessment Education Provided To: Patient Education Topics Provided Wound/Skin Impairment: Handouts: Other: drerssing change of outter dressing if needed Methods: Demonstration, Explain/Verbal Responses: State content correctly Electronic Signature(s) Signed: 08/24/2015 4:50:36 PM By: Montey Hora Entered By: Montey Hora on 08/24/2015 09:43:49 [name] [name] ([id number]) -------------------------------------------------------------------------------- Wound Assessment Details Patient Name: [name] [name] Date of Service: 08/24/2015 8:45 AM Medical Record Number: [id number] Patient Account Number: [id number] Date of Birth/Sex: [birth year] (74 y.o. Male) Treating RN: Montey Hora Primary Care Physician: Thersa Salt Other Clinician: Referring Physician: Thersa Salt Treating Physician/Extender: Frann Rider in Treatment: 22 Wound Status Wound Number: 1 Primary Diabetic Wound/Ulcer of the Lower Etiology: Extremity Wound Location: Left Calcaneous - Lateral Wound Status: Open Wounding Event: Gradually Appeared Comorbid Type II Diabetes Date Acquired: 02/12/2015 History: Weeks Of Treatment: 22 Clustered Wound: No Photos Photo Uploaded By: Montey Hora on 08/24/2015 09:13:29 Wound Measurements Length: (cm) 1.7 Width: (cm) 1.3 Depth: (cm) 0.1 Area: (cm) 1.736 Volume: (cm) 0.174 % Reduction in Area: -97.3% % Reduction in Volume: -97.7% Epithelialization: Medium (34-66%) Tunneling: No Undermining: No Wound Description Classification: Grade 1 Wound Margin: Fibrotic scar, thickened scar Exudate Amount: Small Exudate Type: Serous Exudate Color: amber Wound Bed Granulation Amount: Large (67-100%) Exposed Structure Necrotic Amount: None Present (0%) Fascia Exposed:  No Fat Layer Exposed: No Tendon Exposed: No MICKELL, BIRDWELL.  ([id number]) Muscle Exposed: No Joint Exposed: No Bone Exposed: No Limited to Skin Breakdown Periwound Skin Texture Texture Color No Abnormalities Noted: No No Abnormalities Noted: No Callus: No Atrophie Blanche: No Crepitus: No Cyanosis: No Excoriation: No Ecchymosis: No Fluctuance: No Erythema: No Friable: No Hemosiderin Staining: No Induration: No Mottled: No Localized Edema: No Pallor: No Rash: No Rubor: No Scarring: No Moisture No Abnormalities Noted: No Dry / Scaly: No Maceration: No Moist: Yes Wound Preparation Ulcer Cleansing: Rinsed/Irrigated with Saline Topical Anesthetic Applied: Other: lidocaine 4%, Treatment Notes Wound #1 (Left, Lateral Calcaneous) 1. Cleansed with: Clean wound with Normal Saline 2. Anesthetic Topical Lidocaine 4% cream to wound bed prior to debridement 3. Peri-wound Care: Skin Prep 4. Dressing Applied: Mepitel Other dressing (specify in notes) 5. Secondary Dressing Applied Gauze and Kerlix/Conform Notes coban used to secure. darco shoe with peg assist. epifix applied by MD in clinic Electronic Signature(s) Signed: 08/24/2015 4:50:36 PM By: Montey Hora Entered By: Montey Hora on 08/24/2015 09:09:21 [name] [name] ([name] ([id number]) -------------------------------------------------------------------------------- Vitals Details Patient Name: [name] [name] Date of Service: 08/24/2015 8:45 AM Medical Record Number: [id number] Patient Account Number: [id number] Date of Birth/Sex: 1941/10/10 (74 y.o. Male) Treating RN: Montey Hora Primary Care Physician: Thersa Salt Other Clinician: Referring Physician: Thersa Salt Treating Physician/Extender: Frann Rider in Treatment: 22 Vital Signs Time Taken: 09:00 Temperature (F): 97.9 Height (in): 71 Pulse (bpm): 53 Weight (lbs): 180 Respiratory Rate (breaths/min): 18 Body Mass Index (BMI): 25.1 Blood Pressure (mmHg): 126/57 Reference Range:  80 - 120 mg / dl Electronic Signature(s) Signed: 08/24/2015 4:50:36 PM By: Montey Hora Entered By: Montey Hora on 08/24/2015 09:02:46

## 2015-08-31 ENCOUNTER — Encounter: Payer: Medicare Other | Admitting: Surgery

## 2015-08-31 DIAGNOSIS — L97421 Non-pressure chronic ulcer of left heel and midfoot limited to breakdown of skin: Secondary | ICD-10-CM | POA: Diagnosis not present

## 2015-08-31 NOTE — Progress Notes (Signed)
RAMSEY, GUADAMUZ ([id number]) Visit Report for 08/31/2015 Arrival Information Details Patient Name: [name] [name], [name] [name] Date of Service: 08/31/2015 9:30 AM Medical Record Number: [id number] Patient Account Number: [id number] Date of Birth/Sex: 1941/11/30 (74 y.o. Male) Treating RN: Montey Hora Primary Care Physician: Thersa Salt Other Clinician: Referring Physician: Thersa Salt Treating Physician/Extender: Frann Rider in Treatment: 23 Visit Information History Since Last Visit Added or deleted any medications: No Patient Arrived: Ambulatory Any new allergies or adverse reactions: No Arrival Time: 10:03 Had a fall or experienced change in No Accompanied By: self activities of daily living that may affect Transfer Assistance: None risk of falls: Patient Identification Verified: Yes Signs or symptoms of abuse/neglect since last No Secondary Verification Process Yes visito Completed: Hospitalized since last visit: No Patient Has Alerts: Yes Pain Present Now: No Electronic Signature(s) Signed: 08/31/2015 12:21:34 PM By: Montey Hora Entered By: Montey Hora on 08/31/2015 10:03:20 [name] [name] ([id number]) -------------------------------------------------------------------------------- Clinic Level of Care Assessment Details Patient Name: [name] [name] Date of Service: 08/31/2015 9:30 AM Medical Record Number: [id number] Patient Account Number: [id number] Date of Birth/Sex: 19-Apr-1941 (74 y.o. Male) Treating RN: Montey Hora Primary Care Physician: Thersa Salt Other Clinician: Referring Physician: Thersa Salt Treating Physician/Extender: Frann Rider in Treatment: 23 Clinic Level of Care Assessment Items TOOL 4 Quantity Score []  - Use when only an EandM is performed on FOLLOW-UP visit 0 ASSESSMENTS - Nursing Assessment / Reassessment X - Reassessment of Co-morbidities (includes updates in patient status) 1 10 X - Reassessment of Adherence to Treatment Plan  1 5 ASSESSMENTS - Wound and Skin Assessment / Reassessment X - Simple Wound Assessment / Reassessment - one wound 1 5 []  - Complex Wound Assessment / Reassessment - multiple wounds 0 []  - Dermatologic / Skin Assessment (not related to wound area) 0 ASSESSMENTS - Focused Assessment []  - Circumferential Edema Measurements - multi extremities 0 []  - Nutritional Assessment / Counseling / Intervention 0 X - Lower Extremity Assessment (monofilament, tuning fork, pulses) 1 5 []  - Peripheral Arterial Disease Assessment (using hand held doppler) 0 ASSESSMENTS - Ostomy and/or Continence Assessment and Care []  - Incontinence Assessment and Management 0 []  - Ostomy Care Assessment and Management (repouching, etc.) 0 PROCESS - Coordination of Care X - Simple Patient / Family Education for ongoing care 1 15 []  - Complex (extensive) Patient / Family Education for ongoing care 0 []  - Staff obtains Programmer, systems, Records, Test Results / Process Orders 0 []  - Staff telephones HHA, Nursing Homes / Clarify orders / etc 0 []  - Routine Transfer to another Facility (non-emergent condition) 0 [name] [name], [name] [name]. ([id number]) []  - Routine Hospital Admission (non-emergent condition) 0 []  - New Admissions / Biomedical engineer / Ordering NPWT, Apligraf, etc. 0 []  - Emergency Hospital Admission (emergent condition) 0 X - Simple Discharge Coordination 1 10 []  - Complex (extensive) Discharge Coordination 0 PROCESS - Special Needs []  - Pediatric / Minor Patient Management 0 []  - Isolation Patient Management 0 []  - Hearing / Language / Visual special needs 0 []  - Assessment of Community assistance (transportation, D/C planning, etc.) 0 []  - Additional assistance / Altered mentation 0 []  - Support Surface(s) Assessment (bed, cushion, seat, etc.) 0 INTERVENTIONS - Wound Cleansing / Measurement X - Simple Wound Cleansing - one wound 1 5 []  - Complex Wound Cleansing - multiple wounds 0 []  - Wound Imaging (photographs -  any number of wounds) 0 []  - Wound Tracing (instead of photographs) 0 []  - Simple Wound Measurement - one  wound 0 []  - Complex Wound Measurement - multiple wounds 0 INTERVENTIONS - Wound Dressings X - Small Wound Dressing one or multiple wounds 1 10 []  - Medium Wound Dressing one or multiple wounds 0 []  - Large Wound Dressing one or multiple wounds 0 []  - Application of Medications - topical 0 []  - Application of Medications - injection 0 INTERVENTIONS - Miscellaneous []  - External ear exam 0 [name] [name], [name] [name]. ([id number]) []  - Specimen Collection (cultures, biopsies, blood, body fluids, etc.) 0 []  - Specimen(s) / Culture(s) sent or taken to Lab for analysis 0 []  - Patient Transfer (multiple staff / Harrel Lemon Lift / Similar devices) 0 []  - Simple Staple / Suture removal (25 or less) 0 []  - Complex Staple / Suture removal (26 or more) 0 []  - Hypo / Hyperglycemic Management (close monitor of Blood Glucose) 0 []  - Ankle / Brachial Index (ABI) - do not check if billed separately 0 X - Vital Signs 1 5 Has the patient been seen at the hospital within the last three years: Yes Total Score: 70 Level Of Care: New/Established - Level 2 Electronic Signature(s) Signed: 08/31/2015 12:21:34 PM By: Montey Hora Entered By: Montey Hora on 08/31/2015 10:15:44 [name] [name] ([id number]) -------------------------------------------------------------------------------- Encounter Discharge Information Details Patient Name: [name] [name] Date of Service: 08/31/2015 9:30 AM Medical Record Number: [id number] Patient Account Number: [id number] Date of Birth/Sex: 04/22/1941 (74 y.o. Male) Treating RN: Montey Hora Primary Care Physician: Thersa Salt Other Clinician: Referring Physician: Thersa Salt Treating Physician/Extender: Frann Rider in Treatment: 32 Encounter Discharge Information Items Discharge Pain Level: 0 Discharge Condition: Stable Ambulatory Status: Ambulatory Discharge  Destination: Home Transportation: Private Auto Accompanied By: self Schedule Follow-up Appointment: Yes Medication Reconciliation completed No and provided to Patient/Care [name] [name]: Patient Clinical Summary of Care: Declined Electronic Signature(s) Signed: 08/31/2015 10:33:04 AM By: Ruthine Dose Entered By: Ruthine Dose on 08/31/2015 10:33:04 [name] [name] ([id number]) -------------------------------------------------------------------------------- Lower Extremity Assessment Details Patient Name: [name] [name] Date of Service: 08/31/2015 9:30 AM Medical Record Number: [id number] Patient Account Number: [id number] Date of Birth/Sex: [birth year] (74 y.o. Male) Treating RN: Montey Hora Primary Care Physician: Thersa Salt Other Clinician: Referring Physician: Thersa Salt Treating Physician/Extender: Frann Rider in Treatment: 23 Vascular Assessment Pulses: Posterior Tibial Dorsalis Pedis Palpable: [Left:Yes] Extremity colors, hair growth, and conditions: Extremity Color: [Left:Normal] Hair Growth on Extremity: [Left:Yes] Temperature of Extremity: [Left:Warm] Capillary Refill: [Left:< 3 seconds] Toe Nail Assessment Left: Right: Thick: No Discolored: No Deformed: No Improper Length and Hygiene: No Electronic Signature(s) Signed: 08/31/2015 12:21:34 PM By: Montey Hora Entered By: Montey Hora on 08/31/2015 10:09:33 [name] [name] ([id number]) -------------------------------------------------------------------------------- Multi Wound Chart Details Patient Name: [name] [name] Date of Service: 08/31/2015 9:30 AM Medical Record Number: [id number] Patient Account Number: [id number] Date of Birth/Sex: 07-27-1941 (74 y.o. Male) Treating RN: Montey Hora Primary Care Physician: Thersa Salt Other Clinician: Referring Physician: Thersa Salt Treating Physician/Extender: Frann Rider in Treatment: 23 Vital Signs Height(in): 71 Pulse(bpm):  54 Weight(lbs): 180 Blood Pressure 124/62 (mmHg): Body Mass Index(BMI): 25 Temperature(F): 98.1 Respiratory Rate 18 (breaths/min): Wound Assessments Treatment Notes Electronic Signature(s) Signed: 08/31/2015 12:21:34 PM By: Montey Hora Entered By: Montey Hora on 08/31/2015 10:10:09 [name] [name] ([id number]) -------------------------------------------------------------------------------- [place] Details Patient Name: [name] [name] Date of Service: 08/31/2015 9:30 AM Medical Record Number: [id number] Patient Account Number: [id number] Date of Birth/Sex: 14-Feb-1941 (74 y.o. Male) Treating RN: Montey Hora Primary Care Physician: Thersa Salt Other Clinician: Referring Physician: Thersa Salt Treating Physician/Extender: Con Memos  Errol Weeks in Treatment: 23 Active Inactive Orientation to the Wound Care Program Nursing Diagnoses: Knowledge deficit related to the wound healing center program Goals: Patient/caregiver will verbalize understanding of the [place] Program Date Initiated: 03/23/2015 Goal Status: Active Interventions: Provide education on orientation to the wound center Notes: Wound/Skin Impairment Nursing Diagnoses: Knowledge deficit related to ulceration/compromised skin integrity Goals: Patient/caregiver will verbalize understanding of skin care regimen Date Initiated: 03/23/2015 Goal Status: Active Ulcer/skin breakdown will heal within 14 weeks Date Initiated: 03/23/2015 Goal Status: Active Interventions: Assess patient/caregiver ability to obtain necessary supplies Assess patient/caregiver ability to perform ulcer/skin care regimen upon admission and as needed Assess ulceration(s) every visit Provide education on ulcer and skin care Treatment Activities: Skin care regimen initiated : 08/31/2015 Topical wound management initiated : 08/31/2015 [name] [name], [name] [name] ([id number]) Notes: Electronic Signature(s) Signed:  08/31/2015 12:21:34 PM By: Montey Hora Entered By: Montey Hora on 08/31/2015 10:10:02 [name] [name] ([id number]) -------------------------------------------------------------------------------- Patient/Caregiver Education Details Patient Name: [name] [name] Date of Service: 08/31/2015 9:30 AM Medical Record Number: [id number] Patient Account Number: [id number] Date of Birth/Gender: [birth year] (74 y.o. Male) Treating RN: Montey Hora Primary Care Physician: Thersa Salt Other Clinician: Referring Physician: Thersa Salt Treating Physician/Extender: Frann Rider in Treatment: 22 Education Assessment Education Provided To: Patient Education Topics Provided Wound/Skin Impairment: Handouts: Other: continue wearing dressing for the week Methods: Explain/Verbal Responses: State content correctly Electronic Signature(s) Signed: 08/31/2015 12:21:34 PM By: Montey Hora Entered By: Montey Hora on 08/31/2015 10:17:01 [name] [name] ([id number]) -------------------------------------------------------------------------------- Vitals Details Patient Name: [name] [name] Date of Service: 08/31/2015 9:30 AM Medical Record Number: [id number] Patient Account Number: [id number] Date of Birth/Sex: 1941/09/11 (74 y.o. Male) Treating RN: Montey Hora Primary Care Physician: Thersa Salt Other Clinician: Referring Physician: Thersa Salt Treating Physician/Extender: Frann Rider in Treatment: 23 Vital Signs Time Taken: 10:05 Temperature (F): 98.1 Height (in): 71 Pulse (bpm): 54 Weight (lbs): 180 Respiratory Rate (breaths/min): 18 Body Mass Index (BMI): 25.1 Blood Pressure (mmHg): 124/62 Reference Range: 80 - 120 mg / dl Electronic Signature(s) Signed: 08/31/2015 12:21:34 PM By: Montey Hora Entered By: Montey Hora on 08/31/2015 10:05:45

## 2015-08-31 NOTE — Progress Notes (Signed)
[name], [name] ([id number]) Visit Report for 08/31/2015 Chief Complaint Document Details Patient Name: [name] [name], [name] [name] Date of Service: 08/31/2015 9:30 AM Medical Record Number: [id number] Patient Account Number: [id number] Date of Birth/Sex: 1941-04-02 (74 y.o. Male) Treating RN: Montey Hora Primary Care Physician: Thersa Salt Other Clinician: Referring Physician: Thersa Salt Treating Physician/Extender: Frann Rider in Treatment: 1 Information Obtained from: Patient Chief Complaint Patient presents to the wound care center for a consult due non healing wound pleasant 74 year old gentleman who comes with a ulcerated area on the left lateral heel and he's had it for about a month. Electronic Signature(s) Signed: 08/31/2015 10:24:26 AM By: Christin Fudge MD, FACS Entered By: Christin Fudge on 08/31/2015 10:24:26 [name] [name] ([id number]) -------------------------------------------------------------------------------- HPI Details Patient Name: [name] [name] Date of Service: 08/31/2015 9:30 AM Medical Record Number: [id number] Patient Account Number: [id number] Date of Birth/Sex: [birth year] (74 y.o. Male) Treating RN: Montey Hora Primary Care Physician: Thersa Salt Other Clinician: Referring Physician: Thersa Salt Treating Physician/Extender: Frann Rider in Treatment: 23 History of Present Illness HPI Description: 74 year old gentleman who comes as a self-referral and has been known to be a diabetic on treatment for the last 30 years and takes insulin for this. Developed a callus on the left lateral heel and does not know exactly what type of footwear cause this. Did see his dermatologist but prescribed some medication for him and also applied some Bactroban cream. Able to soften this area and remove some of the dead skin but he now has a definite ulcer there and knowing he's been a diabetic he came as a self-referral for further treatment. He's been a nonsmoker  all his life and hasn't had no problems with any arterial disease and has no workup done for this. 04/06/2015 -- he has been doing his dressing on alternate days but sometimes he runs that the dressing doesn't stay on for long. He also informs me that he is going to be out on vacation this weekend and will be back only in 2 weeks. 04/27/2015 -- his blood sugars under good control and he says he's been offloading as much as possible and has no pain whatsoever. 05/04/2015 -- he has been doing very well his blood sugars are well under control and his offloading and changing of dressing has been very compliant. 05/18/2015 - Due to some family issues he's not been seen back for 2 weeks. he ran out of his Prisma dressing and went and bought a duodenum type of dressing form the pharmacy and has used it for the last 48 hours. He started getting a minimal swelling in his leg and had noticed a change in his wound. 06/08/2015 -- he is back after 2 weeks because of some family commitments and says has been very compliant but is frustrated that his wound has not healed yet. 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 06/29/2015 -- he is here for a wound check after his first application of Epifix Last week. he is doing fine and has no complaints. 07/06/2015 -- he is doing great and is here for a second application of a Epifix. 07/13/2015 -- he has been doing well and is here for a wound check. 07/27/2015 -- he is here for his third application of a Epifix. 08/03/2015 -- has some dry skin  and was concerned whether it's a callus but other than that has been doing fine. 08/12/2015 -- he is here for his fourth application of a Epifix 08/24/2015 - today is his fifth and final application of a Epifix. EBENEZER, MCCASKEY ([id number]) Electronic  Signature(s) Signed: 08/31/2015 10:24:31 AM By: Christin Fudge MD, FACS Entered By: Christin Fudge on 08/31/2015 10:24:31 [name] [name] ([id number]) -------------------------------------------------------------------------------- Physical Exam Details Patient Name: [name] [name] Date of Service: 08/31/2015 9:30 AM Medical Record Number: [id number] Patient Account Number: [id number] Date of Birth/Sex: [birth year] (74 y.o. Male) Treating RN: Montey Hora Primary Care Physician: Thersa Salt Other Clinician: Referring Physician: Thersa Salt Treating Physician/Extender: Frann Rider in Treatment: 23 Constitutional . Pulse regular. Respirations normal and unlabored. Afebrile. . Eyes Nonicteric. Reactive to light. Ears, Nose, Mouth, and Throat Lips, teeth, and gums WNL.Marland Kitchen Moist mucosa without lesions . Neck supple and nontender. No palpable supraclavicular or cervical adenopathy. Normal sized without goiter. Respiratory WNL. No retractions.. Cardiovascular Pedal Pulses WNL. No clubbing, cyanosis or edema. Lymphatic No adneopathy. No adenopathy. No adenopathy. Musculoskeletal Adexa without tenderness or enlargement.. Digits and nails w/o clubbing, cyanosis, infection, petechiae, ischemia, or inflammatory conditions.. Integumentary (Hair, Skin) No suspicious lesions. No crepitus or fluctuance. No peri-wound warmth or erythema. No masses.Marland Kitchen Psychiatric Judgement and insight Intact.. No evidence of depression, anxiety, or agitation.. Notes The wound is looking good and he has the Epifix in place and we have strengthened the Mepitel and will bolster it this week. Electronic Signature(s) Signed: 08/31/2015 10:25:36 AM By: Christin Fudge MD, FACS Entered By: Christin Fudge on 08/31/2015 10:25:35 [name] [name] ([id number]) -------------------------------------------------------------------------------- Physician Orders Details Patient Name: [name] [name] Date of Service: 08/31/2015  9:30 AM Medical Record Number: [id number] Patient Account Number: [id number] Date of Birth/Sex: 12/27/1940 (74 y.o. Male) Treating RN: Montey Hora Primary Care Physician: Thersa Salt Other Clinician: Referring Physician: Thersa Salt Treating Physician/Extender: Frann Rider in Treatment: 35 Verbal / Phone Orders: Yes Clinician: Montey Hora Read Back and Verified: Yes Diagnosis Coding Wound Cleansing Wound #1 Left,Lateral Calcaneous o Clean wound with Normal Saline. Skin Barriers/Peri-Wound Care Wound #1 Left,Lateral Calcaneous o Skin Prep Primary Wound Dressing Wound #1 Left,Lateral Calcaneous o ABD Pad o Dry Gauze - bolster secure with Coban Dressing Change Frequency Wound #1 Left,Lateral Calcaneous o Change dressing every week Follow-up Appointments Wound #1 Left,Lateral Calcaneous o Return Appointment in 1 week. Off-Loading Wound #1 Left,Lateral Calcaneous o Open toe surgical shoe with peg assist. Electronic Signature(s) Signed: 08/31/2015 12:21:34 PM By: Montey Hora Signed: 08/31/2015 1:13:39 PM By: Christin Fudge MD, FACS Entered By: Montey Hora on 08/31/2015 10:14:44 [name] [name] ([id number]) -------------------------------------------------------------------------------- Problem List Details Patient Name: [name] [name] Date of Service: 08/31/2015 9:30 AM Medical Record Number: [id number] Patient Account Number: [id number] Date of Birth/Sex: 1941-09-12 (74 y.o. 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 Male) Treating RN: Marjory Lies,  Di Kindle Primary Care Physician: Thersa Salt Other Clinician: Referring Physician: Thersa Salt Treating Physician/Extender: Frann Rider in Treatment: 38 Subjective Chief Complaint Information obtained from Patient Patient presents to the wound care center for a consult due non healing wound pleasant 74 year old gentleman who comes with a ulcerated area on the left lateral heel and he's had it for about a month. History of Present Illness (HPI) 74 year old gentleman who comes as a self-referral and has been known to be a diabetic on treatment for the last 30 years and takes insulin for this. Developed a callus on the left lateral heel and does not know exactly what type of footwear cause this. Did see his dermatologist but prescribed some medication for him and also applied some Bactroban cream. Able to soften this area and remove some of the dead skin but he now has a definite ulcer there and knowing he's been a diabetic he came as a self-referral for further treatment. He's been a nonsmoker all his life and hasn't had no problems with any arterial disease and has no workup done for this. 04/06/2015 -- he has been doing his dressing on alternate days but sometimes he runs that the dressing doesn't stay on for long. He also informs me that he is going to be out on vacation this weekend and will be back only in 2 weeks. 04/27/2015 -- his blood sugars under good control and he says he's been offloading as much as possible and has no pain  whatsoever. 05/04/2015 -- he has been doing very well his blood sugars are well under control and his offloading and changing of dressing has been very compliant. 05/18/2015 - Due to some family issues he's not been seen back for 2 weeks. he ran out of his Prisma dressing and went and bought a duodenum type of dressing form the pharmacy and has used it for the last 48 hours. He started getting a minimal swelling in his leg and had noticed a change in his wound. 06/08/2015 -- he is back after 2 weeks because of some family commitments and says has been very compliant but is frustrated that his wound has not healed yet. 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 06/29/2015 -- he is here for a wound check after his first application of Epifix Last week. he is doing fine and has no complaints. 07/06/2015 -- he is doing great and is here for a second application of a Epifix. 07/13/2015 -- he has been doing well and is here for a wound check. ODESSA, MORREN ([id number]) 07/27/2015 -- he is here for his third application of a Epifix. 08/03/2015 -- has some dry skin and was concerned whether it's a callus but other than that has been doing fine. 08/12/2015 -- he is here for his fourth application of a Epifix 08/24/2015 - today is his fifth and final application of a Epifix. Objective Constitutional Pulse regular. Respirations normal and unlabored. Afebrile. Vitals Time Taken: 10:05 AM, Height: 71 in, Weight: 180 lbs, BMI: 25.1, Temperature: 98.1 F, Pulse: 54 bpm, Respiratory Rate: 18 breaths/min, Blood Pressure: 124/62 mmHg. Eyes Nonicteric. Reactive to light. Ears, Nose, Mouth, and Throat Lips, teeth, and gums WNL.Marland Kitchen Moist mucosa without lesions . Neck supple and nontender. No palpable supraclavicular or cervical adenopathy.  Normal sized without goiter. Respiratory  WNL. No retractions.. Cardiovascular Pedal Pulses WNL. No clubbing, cyanosis or edema. Lymphatic No adneopathy. No adenopathy. No adenopathy. Musculoskeletal Adexa without tenderness or enlargement.. Digits and nails w/o clubbing, cyanosis, infection, petechiae, ischemia, or inflammatory conditions.Marland Kitchen Psychiatric Judgement and insight Intact.. No evidence of depression, anxiety, or agitation.. General Notes: The wound is looking good and he has the Epifix in place and we have strengthened the Mepitel and will bolster it this week. BRASON, BERTHELOT ([id number]) Integumentary (Hair, Skin) No suspicious lesions. No crepitus or fluctuance. No peri-wound warmth or erythema. No masses.. 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 He will come back and see me next week. Plan Wound Cleansing: Wound #1 Left,Lateral Calcaneous: Clean wound with Normal Saline. Skin Barriers/Peri-Wound Care: Wound #1 Left,Lateral Calcaneous: Skin Prep Primary Wound Dressing: Wound #1 Left,Lateral Calcaneous: ABD Pad Dry Gauze - bolster secure with Coban Dressing Change Frequency: Wound #1 Left,Lateral Calcaneous: Change dressing every week Follow-up Appointments: Wound #1 Left,Lateral Calcaneous: Return Appointment in 1 week. Off-Loading: Wound #1 Left,Lateral Calcaneous: Open toe surgical shoe with peg assist. BLUFORD, SEDLER ([id number]) Having completed his 5 applications of Epifix, I would now consider collagen over the wound and have discussed with him the possibility of using a DH  walking boot to offload this area in the future as he is unable to tolerate a total contact cast. He will come back and see me next week. Electronic Signature(s) Signed: 08/31/2015 10:27:27 AM By: Christin Fudge MD, FACS Entered By: Christin Fudge on 08/31/2015 10:27:27 [name] [name] ([id number]) -------------------------------------------------------------------------------- SuperBill Details Patient Name: [name] [name] Date of Service: 08/31/2015 Medical Record Number: [id number] Patient Account Number: [id number] Date of Birth/Sex: 17-Feb-1941 (74 y.o. 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

## 2015-09-07 ENCOUNTER — Encounter: Payer: Medicare Other | Admitting: Surgery

## 2015-09-07 DIAGNOSIS — L97421 Non-pressure chronic ulcer of left heel and midfoot limited to breakdown of skin: Secondary | ICD-10-CM | POA: Diagnosis not present

## 2015-09-07 NOTE — Progress Notes (Addendum)
MARTISE, WADDELL ([id number]) Visit Report for 09/07/2015 Arrival Information Details Patient Name: [name] [name], [name] [name] Date of Service: 09/07/2015 10:00 AM Medical Record Number: [id number] Patient Account Number: [id number] Date of Birth/Sex: 08-30-1941 (74 y.o. Male) Treating RN: Baruch Gouty, RN, BSN, Velva Harman Primary Care Physician: Thersa Salt Other Clinician: Referring Physician: Thersa Salt Treating Physician/Extender: Frann Rider in Treatment: 24 Visit Information History Since Last Visit Any new allergies or adverse reactions: No Patient Arrived: [name] Had a fall or experienced change in No Arrival Time: 10:00 activities of daily living that may affect Accompanied By: self risk of falls: Transfer Assistance: None Signs or symptoms of abuse/neglect since last No Patient Identification Verified: Yes visito Secondary Verification Process Completed: Yes Hospitalized since last visit: No Patient Has Alerts: Yes Has Dressing in Place as Prescribed: Yes Pain Present Now: No Electronic Signature(s) Signed: 09/07/2015 10:02:41 AM By: Regan Lemming BSN, RN Entered By: Regan Lemming on 09/07/2015 10:02:40 [name] ([id number]) -------------------------------------------------------------------------------- Clinic Level of Care Assessment Details Patient Name: [name] Date of Service: 09/07/2015 10:00 AM Medical Record Number: [id number] Patient Account Number: [id number] Date of Birth/Sex: February 13, 1941 (74 y.o. Male) Treating RN: Montey Hora Primary Care Physician: Thersa Salt Other Clinician: Referring Physician: Thersa Salt Treating Physician/Extender: Frann Rider in Treatment: 24 Clinic Level of Care Assessment Items TOOL 4 Quantity Score []  - Use when only an EandM is performed on FOLLOW-UP visit 0 ASSESSMENTS - Nursing Assessment / Reassessment X - Reassessment of Co-morbidities (includes updates in patient status) 1 10 X - Reassessment of Adherence to  Treatment Plan 1 5 ASSESSMENTS - Wound and Skin Assessment / Reassessment X - Simple Wound Assessment / Reassessment - one wound 1 5 []  - Complex Wound Assessment / Reassessment - multiple wounds 0 []  - Dermatologic / Skin Assessment (not related to wound area) 0 ASSESSMENTS - Focused Assessment []  - Circumferential Edema Measurements - multi extremities 0 []  - Nutritional Assessment / Counseling / Intervention 0 X - Lower Extremity Assessment (monofilament, tuning fork, pulses) 1 5 []  - Peripheral Arterial Disease Assessment (using hand held doppler) 0 ASSESSMENTS - Ostomy and/or Continence Assessment and Care []  - Incontinence Assessment and Management 0 []  - Ostomy Care Assessment and Management (repouching, etc.) 0 PROCESS - Coordination of Care X - Simple Patient / Family Education for ongoing care 1 15 []  - Complex (extensive) Patient / Family Education for ongoing care 0 []  - Staff obtains Programmer, systems, Records, Test Results / Process Orders 0 []  - Staff telephones HHA, Nursing Homes / Clarify orders / etc 0 []  - Routine Transfer to another Facility (non-emergent condition) 0 SKYLIER, KRETSCHMER. ([id number]) []  - Routine Hospital Admission (non-emergent condition) 0 []  - New Admissions / Biomedical engineer / Ordering NPWT, Apligraf, etc. 0 []  - Emergency Hospital Admission (emergent condition) 0 X - Simple Discharge Coordination 1 10 []  - Complex (extensive) Discharge Coordination 0 PROCESS - Special Needs []  - Pediatric / Minor Patient Management 0 []  - Isolation Patient Management 0 []  - Hearing / Language / Visual special needs 0 []  - Assessment of Community assistance (transportation, D/C planning, etc.) 0 []  - Additional assistance / Altered mentation 0 []  - Support Surface(s) Assessment (bed, cushion, seat, etc.) 0 INTERVENTIONS - Wound Cleansing / Measurement X - Simple Wound Cleansing - one wound 1 5 []  - Complex Wound Cleansing - multiple wounds 0 X - Wound Imaging  (photographs - any number of wounds) 1 5 []  - Wound Tracing (instead of photographs) 0 X -  Simple Wound Measurement - one wound 1 5 []  - Complex Wound Measurement - multiple wounds 0 INTERVENTIONS - Wound Dressings X - Small Wound Dressing one or multiple wounds 1 10 []  - Medium Wound Dressing one or multiple wounds 0 []  - Large Wound Dressing one or multiple wounds 0 []  - Application of Medications - topical 0 []  - Application of Medications - injection 0 INTERVENTIONS - Miscellaneous []  - External ear exam 0 NYCHOLAS, RAYNER. ([id number]) []  - Specimen Collection (cultures, biopsies, blood, body fluids, etc.) 0 []  - Specimen(s) / Culture(s) sent or taken to Lab for analysis 0 []  - Patient Transfer (multiple staff / Harrel Lemon Lift / Similar devices) 0 []  - Simple Staple / Suture removal (25 or less) 0 []  - Complex Staple / Suture removal (26 or more) 0 []  - Hypo / Hyperglycemic Management (close monitor of Blood Glucose) 0 []  - Ankle / Brachial Index (ABI) - do not check if billed separately 0 X - Vital Signs 1 5 Has the patient been seen at the hospital within the last three years: Yes Total Score: 80 Level Of Care: New/Established - Level 3 Electronic Signature(s) Signed: 09/07/2015 4:21:27 PM By: Montey Hora Entered By: Montey Hora on 09/07/2015 10:26:44 [name] ([id number]) -------------------------------------------------------------------------------- Encounter Discharge Information Details Patient Name: [name] Date of Service: 09/07/2015 10:00 AM Medical Record Number: [id number] Patient Account Number: [id number] Date of Birth/Sex: 1941/01/01 (74 y.o. Male) Treating RN: Baruch Gouty, RN, BSN, Velva Harman Primary Care Physician: Thersa Salt Other Clinician: Referring Physician: Thersa Salt Treating Physician/Extender: Frann Rider in Treatment: 24 Encounter Discharge Information Items Schedule Follow-up Appointment: No Medication Reconciliation completed No and  provided to Patient/Care Provider: Provided on Clinical Summary of Care: 09/07/2015 Form Type Recipient Paper Patient RO Electronic Signature(s) Signed: 09/07/2015 10:29:13 AM By: Ruthine Dose Entered By: Ruthine Dose on 09/07/2015 10:29:13 [name] ([id number]) -------------------------------------------------------------------------------- Lower Extremity Assessment Details Patient Name: [name] Date of Service: 09/07/2015 10:00 AM Medical Record Number: [id number] Patient Account Number: [id number] Date of Birth/Sex: 22-Jul-1941 (74 y.o. Male) Treating RN: Baruch Gouty, RN, BSN, Velva Harman Primary Care Physician: Thersa Salt Other Clinician: Referring Physician: Thersa Salt Treating Physician/Extender: Frann Rider in Treatment: 24 Vascular Assessment Pulses: Posterior Tibial Dorsalis Pedis Palpable: [Left:Yes] Extremity colors, hair growth, and conditions: Extremity Color: [Left:Normal] Hair Growth on Extremity: [Left:Yes] Temperature of Extremity: [Left:Warm] Capillary Refill: [Left:< 3 seconds] Toe Nail Assessment Left: Right: Thick: No Discolored: No Deformed: No Improper Length and Hygiene: No Electronic Signature(s) Signed: 09/07/2015 10:04:31 AM By: Regan Lemming BSN, RN Entered By: Regan Lemming on 09/07/2015 10:04:31 [name] ([id number]) -------------------------------------------------------------------------------- Multi Wound Chart Details Patient Name: [name] Date of Service: 09/07/2015 10:00 AM Medical Record Number: [id number] Patient Account Number: [id number] Date of Birth/Sex: 1941-09-23 (74 y.o. Male) Treating RN: Montey Hora Primary Care Physician: Thersa Salt Other Clinician: Referring Physician: Thersa Salt Treating Physician/Extender: Frann Rider in Treatment: 24 Vital Signs Height(in): 71 Pulse(bpm): 56 Weight(lbs): 180 Blood Pressure 109/53 (mmHg): Body Mass Index(BMI): 25 Temperature(F):  98.1 Respiratory Rate 18 (breaths/min): Photos: [1:No Photos] [N/A:N/A] Wound Location: [1:Left Calcaneous - Lateral] [N/A:N/A] Wounding Event: [1:Gradually Appeared] [N/A:N/A] Primary Etiology: [1:Diabetic Wound/Ulcer of the Lower Extremity] [N/A:N/A] Comorbid History: [1:Type II Diabetes] [N/A:N/A] Date Acquired: [1:02/12/2015] [N/A:N/A] Weeks of Treatment: [1:24] [N/A:N/A] Wound Status: [1:Open] [N/A:N/A] Measurements L x W x D 1.7x2.4x0.1 [N/A:N/A] (cm) Area (cm) : [1:3.204] [N/A:N/A] Volume (cm) : [1:0.32] [N/A:N/A] % Reduction in Area: [1:-264.10%] [N/A:N/A] % Reduction in Volume: -  263.60% [N/A:N/A] Classification: [1:Grade 1] [N/A:N/A] Exudate Amount: [1:Small] [N/A:N/A] Exudate Type: [1:Sanguinous] [N/A:N/A] Exudate Color: [1:red] [N/A:N/A] Wound Margin: [1:Fibrotic scar, thickened scar] [N/A:N/A] Granulation Amount: [1:Large (67-100%)] [N/A:N/A] Necrotic Amount: [1:None Present (0%)] [N/A:N/A] Exposed Structures: [1:Fascia: No Fat: No Tendon: No Muscle: No Joint: No Bone: No] [N/A:N/A] Limited to Skin Breakdown Epithelialization: Medium (34-66%) N/A N/A Periwound Skin Texture: Edema: No N/A N/A Excoriation: No Induration: No Callus: No Crepitus: No Fluctuance: No Friable: No Rash: No Scarring: No Periwound Skin Moist: Yes N/A N/A Moisture: Maceration: No Dry/Scaly: No Periwound Skin Color: Atrophie Blanche: No N/A N/A Cyanosis: No Ecchymosis: No Erythema: No Hemosiderin Staining: No Mottled: No Pallor: No Rubor: No Temperature: No Abnormality N/A N/A Tenderness on No N/A N/A Palpation: Wound Preparation: Ulcer Cleansing: N/A N/A Rinsed/Irrigated with Saline Topical Anesthetic Applied: Other: lidocaine 4% Treatment Notes Electronic Signature(s) Signed: 09/07/2015 4:21:27 PM By: Montey Hora Entered By: Montey Hora on 09/07/2015 10:15:33 [name]  ([id number]) -------------------------------------------------------------------------------- [place] Details Patient Name: [name] Date of Service: 09/07/2015 10:00 AM Medical Record Number: [id number] Patient Account Number: [id number] Date of Birth/Sex: 26-Jun-1941 (74 y.o. Male) Treating RN: Montey Hora Primary Care Physician: Thersa Salt Other Clinician: Referring Physician: Thersa Salt Treating Physician/Extender: Frann Rider in Treatment: 24 Active Inactive Electronic Signature(s) Signed: 10/01/2015 3:34:06 PM By: Gretta Cool RN, BSN, Kim RN, BSN Signed: 10/05/2015 5:44:39 PM By: Montey Hora Previous Signature: 09/07/2015 4:21:27 PM Version By: Montey Hora Entered By: Gretta Cool RN, BSN, Kim on 09/30/2015 10:07:52 [name] ([id number]) -------------------------------------------------------------------------------- Pain Assessment Details Patient Name: [name] Date of Service: 09/07/2015 10:00 AM Medical Record Number: [id number] Patient Account Number: [id number] Date of Birth/Sex: August 12, 1941 (74 y.o. Male) Treating RN: Baruch Gouty, RN, BSN, [place] Primary Care Physician: Thersa Salt Other Clinician: Referring Physician: Thersa Salt Treating Physician/Extender: Frann Rider in Treatment: 24 Active Problems Location of Pain Severity and Description of Pain Patient Has Paino No Site Locations Pain Management and Medication Current Pain Management: Electronic Signature(s) Signed: 09/07/2015 10:02:49 AM By: Regan Lemming BSN, RN Entered By: Regan Lemming on 09/07/2015 10:02:49 [name] ([id number]) -------------------------------------------------------------------------------- Patient/Caregiver Education Details Patient Name: [name] Date of Service: 09/07/2015 10:00 AM Medical Record Number: [id number] Patient Account Number: [id number] Date of Birth/Gender: [birth year] (74 y.o. Male) Treating RN: Montey Hora Primary Care Physician: Thersa Salt Other Clinician: Referring Physician: Thersa Salt Treating Physician/Extender: Frann Rider in Treatment: 24 Education Assessment Education Provided To: Patient Education Topics Provided Offloading: Handouts: Other: [place] walking boot by Dr Con Memos Methods: Explain/Verbal Responses: State content correctly Electronic Signature(s) Signed: 09/07/2015 4:21:27 PM By: Montey Hora Entered By: Montey Hora on 09/07/2015 10:16:12 [name] ([id number]) -------------------------------------------------------------------------------- Wound Assessment Details Patient Name: [name] Date of Service: 09/07/2015 10:00 AM Medical Record Number: [id number] Patient Account Number: [id number] Date of Birth/Sex: 1941/04/08 (74 y.o. Male) Treating RN: Afful, RN, BSN, [place] Primary Care Physician: Thersa Salt Other Clinician: Referring Physician: Thersa Salt Treating Physician/Extender: Frann Rider in Treatment: 24 Wound Status Wound Number: 1 Primary Diabetic Wound/Ulcer of the Lower Etiology: Extremity Wound Location: Left Calcaneous - Lateral Wound Status: Open Wounding Event: Gradually Appeared Comorbid Type II Diabetes Date Acquired: 02/12/2015 History: Weeks Of Treatment: 24 Clustered Wound: No Photos Photo Uploaded By: Regan Lemming on 09/07/2015 13:26:10 Wound Measurements Length: (cm) 1.7 Width: (cm) 2.4 Depth: (cm) 0.1 Area: (cm) 3.204 Volume: (cm) 0.32 % Reduction in Area: -264.1% % Reduction in Volume: -263.6% Epithelialization: Medium (34-66%) Tunneling: No Undermining:  No Wound Description Classification: Grade 1 Wound Margin: Fibrotic scar, thickened scar Exudate Amount: Small Exudate Type: Sanguinous Exudate Color: red Wound Bed Granulation Amount: Large (67-100%) Exposed Structure Necrotic Amount: None Present (0%) Fascia Exposed: No Fat Layer Exposed: No Tendon Exposed: No BARRET, ESQUIVEL.  ([id number]) Muscle Exposed: No Joint Exposed: No Bone Exposed: No Limited to Skin Breakdown Periwound Skin Texture Texture Color No Abnormalities Noted: No No Abnormalities Noted: No Callus: No Atrophie Blanche: No Crepitus: No Cyanosis: No Excoriation: No Ecchymosis: No Fluctuance: No Erythema: No Friable: No Hemosiderin Staining: No Induration: No Mottled: No Localized Edema: No Pallor: No Rash: No Rubor: No Scarring: No Temperature / Pain Moisture Temperature: No Abnormality No Abnormalities Noted: No Dry / Scaly: No Maceration: No Moist: Yes Wound Preparation Ulcer Cleansing: Rinsed/Irrigated with Saline Topical Anesthetic Applied: Other: lidocaine 4%, Electronic Signature(s) Signed: 09/07/2015 10:07:01 AM By: Regan Lemming BSN, RN Entered By: Regan Lemming on 09/07/2015 10:07:00 [name] ([id number]) -------------------------------------------------------------------------------- Vitals Details Patient Name: [name] Date of Service: 09/07/2015 10:00 AM Medical Record Number: [id number] Patient Account Number: [id number] Date of Birth/Sex: March 10, 1941 (74 y.o. Male) Treating RN: Afful, RN, BSN, [place] Primary Care Physician: Thersa Salt Other Clinician: Referring Physician: Thersa Salt Treating Physician/Extender: Frann Rider in Treatment: 24 Vital Signs Time Taken: 10:02 Temperature (F): 98.1 Height (in): 71 Pulse (bpm): 56 Weight (lbs): 180 Respiratory Rate (breaths/min): 18 Body Mass Index (BMI): 25.1 Blood Pressure (mmHg): 109/53 Reference Range: 80 - 120 mg / dl Electronic Signature(s) Signed: 09/07/2015 10:03:43 AM By: Regan Lemming BSN, RN Entered By: Regan Lemming on 09/07/2015 10:03:43

## 2015-09-08 NOTE — Progress Notes (Signed)
HARSHIL, CAVALLARO ([id number]) Visit Report for 09/07/2015 Chief Complaint Document Details Patient Name: [name] [name], [name] [name] Date of Service: 09/07/2015 10:00 AM Medical Record Number: [id number] Patient Account Number: [id number] Date of Birth/Sex: 06-Apr-1941 (74 y.o. Male) Treating RN: Baruch Gouty, RN, BSN, Velva Harman Primary Care Physician: Thersa Salt Other Clinician: Referring Physician: Thersa Salt Treating Physician/Extender: Frann Rider in Treatment: 24 Information Obtained from: Patient Chief Complaint Patient presents to the wound care center for a consult due non healing wound pleasant 74 year old gentleman who comes with a ulcerated area on the left lateral heel and he's had it for about a month. Electronic Signature(s) Signed: 09/07/2015 10:32:50 AM By: Christin Fudge MD, FACS Entered By: Christin Fudge on 09/07/2015 10:32:50 [name] [name] ([id number]) -------------------------------------------------------------------------------- HPI Details Patient Name: [name] [name] Date of Service: 09/07/2015 10:00 AM Medical Record Number: [id number] Patient Account Number: [id number] Date of Birth/Sex: [birth year] (74 y.o. Male) Treating RN: Baruch Gouty, RN, BSN, Velva Harman Primary Care Physician: Thersa Salt Other Clinician: Referring Physician: Thersa Salt Treating Physician/Extender: Frann Rider in Treatment: 24 History of Present Illness HPI Description: 74 year old gentleman who comes as a self-referral and has been known to be a diabetic on treatment for the last 30 years and takes insulin for this. Developed a callus on the left lateral heel and does not know exactly what type of footwear cause this. Did see his dermatologist but prescribed some medication for him and also applied some Bactroban cream. Able to soften this area and remove some of the dead skin but he now has a definite ulcer there and knowing he's been a diabetic he came as a self-referral for further treatment. He's  been a nonsmoker all his life and hasn't had no problems with any arterial disease and has no workup done for this. 04/06/2015 -- he has been doing his dressing on alternate days but sometimes he runs that the dressing doesn't stay on for long. He also informs me that he is going to be out on vacation this weekend and will be back only in 2 weeks. 04/27/2015 -- his blood sugars under good control and he says he's been offloading as much as possible and has no pain whatsoever. 05/04/2015 -- he has been doing very well his blood sugars are well under control and his offloading and changing of dressing has been very compliant. 05/18/2015 - Due to some family issues he's not been seen back for 2 weeks. he ran out of his Prisma dressing and went and bought a duodenum type of dressing form the pharmacy and has used it for the last 48 hours. He started getting a minimal swelling in his leg and had noticed a change in his wound. 06/08/2015 -- he is back after 2 weeks because of some family commitments and says has been very compliant but is frustrated that his wound has not healed yet. 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 06/29/2015 -- he is here for a wound check after his first application of Epifix Last week. he is doing fine and has no complaints. 07/06/2015 -- he is doing great and is here for a second application of a Epifix. 07/13/2015 -- he has been doing well and is here for a wound check. 07/27/2015 -- he is here for his third application of a Epifix. 08/03/2015 --  has some dry skin and was concerned whether it's a callus but other than that has been doing fine. 08/12/2015 -- he is here for his fourth application of a Epifix 08/24/2015 - today is his fifth and final application of a Epifix. 09/07/2015 -- his dressing  came out a couple of days ago and other than that the wound has been bleeding ANUP, BRIGHAM. ([id number]) a bit to touch from the granulation tissue. He is concerned about the length of time he's had this wound ( over 5 months) and it has not completely healed and at this stage would like to get a second opinion from [facility] wound care center. We will go ahead and make the appropriate referral and send him with our notes. Electronic Signature(s) Signed: 09/07/2015 10:58:20 AM By: Christin Fudge MD, FACS Previous Signature: 09/07/2015 10:35:52 AM Version By: Christin Fudge MD, FACS Entered By: Christin Fudge on 09/07/2015 10:58:20 [name] [name] ([id number]) -------------------------------------------------------------------------------- Physical Exam Details Patient Name: [name] [name] Date of Service: 09/07/2015 10:00 AM Medical Record Number: [id number] Patient Account Number: [id number] Date of Birth/Sex: 02-01-1941 (74 y.o. Male) Treating RN: Baruch Gouty, RN, BSN, Velva Harman Primary Care Physician: Thersa Salt Other Clinician: Referring Physician: Thersa Salt Treating Physician/Extender: Frann Rider in Treatment: 24 Constitutional . Pulse regular. Respirations normal and unlabored. Afebrile. . Eyes Nonicteric. Reactive to light. Ears, Nose, Mouth, and Throat Lips, teeth, and gums WNL.Marland Kitchen Moist mucosa without lesions . Neck supple and nontender. No palpable supraclavicular or cervical adenopathy. Normal sized without goiter. Respiratory WNL. No retractions.. Cardiovascular Pedal Pulses WNL. No clubbing, cyanosis or edema. Lymphatic No adneopathy. No adenopathy. No adenopathy. Musculoskeletal Adexa without tenderness or enlargement.. Digits and nails w/o clubbing, cyanosis, infection, petechiae, ischemia, or inflammatory conditions.. Integumentary (Hair, Skin) No suspicious lesions. No crepitus or fluctuance. No peri-wound warmth or erythema. No masses.Marland Kitchen Psychiatric Judgement and  insight Intact.. No evidence of depression, anxiety, or agitation.. Notes His wound has healthy granulation tissue but there is still significant lack of epithelialization over the wound. Electronic Signature(s) Signed: 09/07/2015 11:00:37 AM By: Christin Fudge MD, FACS Entered By: Christin Fudge on 09/07/2015 11:00:37 [name] [name] ([id number]) -------------------------------------------------------------------------------- Physician Orders Details Patient Name: [name] [name] Date of Service: 09/07/2015 10:00 AM Medical Record Number: [id number] Patient Account Number: [id number] Date of Birth/Sex: May 16, 1941 (74 y.o. Male) Treating RN: Montey Hora Primary Care Physician: Thersa Salt Other Clinician: Referring Physician: Thersa Salt Treating Physician/Extender: Frann Rider in Treatment: 82 Verbal / Phone Orders: Yes Clinician: Montey Hora Read Back and Verified: Yes Diagnosis Coding Wound Cleansing Wound #1 Left,Lateral Calcaneous o Clean wound with Normal Saline. Skin Barriers/Peri-Wound Care Wound #1 Left,Lateral Calcaneous o Skin Prep Primary Wound Dressing Wound #1 Left,Lateral Calcaneous o Promogran Secondary Dressing Wound #1 Left,Lateral Calcaneous o Boardered Foam Dressing Dressing Change Frequency Wound #1 Left,Lateral Calcaneous o Change dressing every week Follow-up Appointments Wound #1 Left,Lateral Calcaneous o Return Appointment in 1 week. Off-Loading Wound #1 Left,Lateral Calcaneous o Open toe surgical shoe with peg assist. Electronic Signature(s) Signed: 09/07/2015 3:53:46 PM By: Christin Fudge MD, FACS Signed: 09/07/2015 4:21:27 PM By: Montey Hora Entered By: Montey Hora on 09/07/2015 10:26:15 [name] [name] ([name] ([id number]) -------------------------------------------------------------------------------- Problem List Details Patient Name: [name] [name] Date of Service: 09/07/2015 10:00  AM Medical Record Number: [id number] Patient Account Number: [id number] Date of Birth/Sex: November 08, 1941 (74 y.o. 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 Male) Treating RN: Baruch Gouty, RN, BSN, Velva Harman Primary Care Physician: Thersa Salt Other Clinician: Referring Physician: Thersa Salt Treating Physician/Extender: Frann Rider in Treatment: 24 Subjective Chief Complaint Information obtained from Patient Patient presents to the wound care center for a consult due non healing wound pleasant 74 year old gentleman who comes with a ulcerated area on the left lateral heel and he's had it for about a month. History of Present Illness (HPI) 74 year old gentleman who comes as a self-referral and has been known to be a diabetic on treatment for the last 30 years and takes insulin for this. Developed a callus on the left lateral heel and does not know exactly what type of footwear cause this. Did see his dermatologist but prescribed some medication for him and also applied some Bactroban cream. Able to soften this area and remove some of the  dead skin but he now has a definite ulcer there and knowing he's been a diabetic he came as a self-referral for further treatment. He's been a nonsmoker all his life and hasn't had no problems with any arterial disease and has no workup done for this. 04/06/2015 -- he has been doing his dressing on alternate days but sometimes he runs that the dressing doesn't stay on for long. He also informs me that he is going to be out on vacation this weekend and will be back only in 2 weeks. 04/27/2015 -- his blood sugars under good control and he says he's been offloading as much as possible and has no pain whatsoever. 05/04/2015 -- he has been doing very well his blood sugars are well under control and his offloading and changing of dressing has been very compliant. 05/18/2015 - Due to some family issues he's not been seen back for 2 weeks. he ran out of his Prisma dressing and went and bought a duodenum type of dressing form the pharmacy and has used it for the last 48 hours. He started getting a minimal swelling in his leg and had noticed a change in his wound. 06/08/2015 -- he is back after 2 weeks because of some family commitments and says has been very compliant but is frustrated that his wound has not healed yet. 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 06/29/2015 -- he is here for a wound check after his first application of Epifix Last week. he is doing fine and has no complaints. 07/06/2015 -- he is doing great and is here for a second application of a Epifix. 07/13/2015 -- he has been doing well and is here for a wound check. KEELON, ZURN ([id number]) 07/27/2015 -- he is here for his third application of a Epifix. 08/03/2015 -- has some dry skin and was concerned whether it's a callus but other than  that has been doing  fine. 08/12/2015 -- he is here for his fourth application of a Epifix 08/24/2015 - today is his fifth and final application of a Epifix. 09/07/2015 -- his dressing came out a couple of days ago and other than that the wound has been bleeding a bit to touch from the granulation tissue. He is concerned about the length of time he's had this wound ( over 5 months) and it has not completely healed and at this stage would like to get a second opinion from [facility] wound care center. We will go ahead and make the appropriate referral and send him with our notes. Objective Constitutional Pulse regular. Respirations normal and unlabored. Afebrile. Vitals Time Taken: 10:02 AM, Height: 71 in, Weight: 180 lbs, BMI: 25.1, Temperature: 98.1 F, Pulse: 56 bpm, Respiratory Rate: 18 breaths/min, Blood Pressure: 109/53 mmHg. Eyes Nonicteric. Reactive to light. Ears, Nose, Mouth, and Throat Lips, teeth, and gums WNL.Marland Kitchen Moist mucosa without lesions . Neck supple and nontender. No palpable supraclavicular or cervical adenopathy. Normal sized without goiter. Respiratory WNL. No retractions.. Cardiovascular Pedal Pulses WNL. No clubbing, cyanosis or edema. Lymphatic No adneopathy. No adenopathy. No adenopathy. Musculoskeletal Adexa without tenderness or enlargement.. Digits and nails w/o clubbing, cyanosis, infection, petechiae, ischemia, or inflammatory conditions.Marland Kitchen Psychiatric Judgement and insight Intact.. No evidence of depression, anxiety, or agitation.Marland Kitchen OAKLEE, SUNGA ([id number]) General Notes: His wound has healthy granulation tissue but there is still significant lack of epithelialization over the wound. Integumentary (Hair, Skin) No suspicious lesions. No crepitus or fluctuance. No peri-wound warmth or erythema. No masses.. Wound #1 status is Open. Original cause of wound was Gradually Appeared. The wound is located on the Left,Lateral Calcaneous. The wound measures 1.7cm length x 2.4cm  width x 0.1cm depth; 3.204cm^2 area and 0.32cm^3 volume. The wound is limited to skin breakdown. There is no tunneling or undermining noted. There is a small amount of sanguinous drainage noted. The wound margin is fibrotic, thickened scar. There is large (67-100%) granulation within the wound bed. There is no necrotic tissue within the wound bed. The periwound skin appearance exhibited: Moist. The periwound skin appearance did not exhibit: Callus, Crepitus, Excoriation, Fluctuance, Friable, Induration, Localized Edema, Rash, Scarring, Dry/Scaly, Maceration, Atrophie Blanche, Cyanosis, Ecchymosis, Hemosiderin Staining, Mottled, Pallor, Rubor, Erythema. Periwound temperature was noted as No Abnormality. 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 At this stage would like to get a second opinion from [facility] wound care center. We will go ahead and make the appropriate referral and send him with our notes. Plan Wound Cleansing: Wound #1 Left,Lateral Calcaneous: Clean wound with Normal Saline. Skin Barriers/Peri-Wound Care: Wound #1 Left,Lateral Calcaneous: ZACKARY, MCKEONE ([id number]) Skin Prep Primary Wound Dressing: Wound #1 Left,Lateral Calcaneous: Promogran Secondary Dressing: Wound #1 Left,Lateral Calcaneous: Boardered Foam Dressing Dressing Change Frequency: Wound #1 Left,Lateral Calcaneous: Change dressing every week Follow-up Appointments: Wound #1 Left,Lateral  Calcaneous: Return Appointment in 1 week. Off-Loading: Wound #1 Left,Lateral Calcaneous: Open  toe surgical shoe with peg assist. As discussed with the patient today we will put in a referral to the [place] wound care center and try and obtain an appointment for a second opinion. Electronic Signature(s) Signed: 09/07/2015 11:07:30 AM By: Christin Fudge MD, FACS Entered By: Christin Fudge on 09/07/2015 11:07:30 [name] [name] ([id number]) -------------------------------------------------------------------------------- SuperBill Details Patient Name: [name] [name] Date of Service: 09/07/2015 Medical Record Number: [id number] Patient Account Number: [id number] Date of Birth/Sex: March 28, 1941 (74 y.o. 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

## 2015-09-14 ENCOUNTER — Ambulatory Visit: Payer: BLUE CROSS/BLUE SHIELD | Admitting: Surgery

## 2015-09-14 ENCOUNTER — Telehealth: Payer: Self-pay | Admitting: Family Medicine

## 2015-09-14 NOTE — Telephone Encounter (Signed)
Pt  dropped off a disability parking placard form to be filled out.Marland Kitchen Please advise pt when ready.. Placed form  in Dr. Geralynn Ochs box.

## 2015-09-14 NOTE — Telephone Encounter (Signed)
It is up front and pt has been called to come by and pick it up.

## 2015-09-23 DIAGNOSIS — I872 Venous insufficiency (chronic) (peripheral): Secondary | ICD-10-CM | POA: Insufficient documentation

## 2015-11-18 DIAGNOSIS — L97509 Non-pressure chronic ulcer of other part of unspecified foot with unspecified severity: Secondary | ICD-10-CM | POA: Insufficient documentation

## 2015-11-25 ENCOUNTER — Encounter: Payer: Medicare Other | Attending: Surgery | Admitting: Surgery

## 2015-11-25 DIAGNOSIS — L97421 Non-pressure chronic ulcer of left heel and midfoot limited to breakdown of skin: Secondary | ICD-10-CM | POA: Diagnosis not present

## 2015-11-25 DIAGNOSIS — I83025 Varicose veins of left lower extremity with ulcer other part of foot: Secondary | ICD-10-CM | POA: Diagnosis not present

## 2015-11-25 DIAGNOSIS — Z951 Presence of aortocoronary bypass graft: Secondary | ICD-10-CM | POA: Diagnosis not present

## 2015-11-25 DIAGNOSIS — I889 Nonspecific lymphadenitis, unspecified: Secondary | ICD-10-CM | POA: Insufficient documentation

## 2015-11-25 DIAGNOSIS — I890 Lymphedema, not elsewhere classified: Secondary | ICD-10-CM | POA: Diagnosis not present

## 2015-11-25 DIAGNOSIS — Z794 Long term (current) use of insulin: Secondary | ICD-10-CM | POA: Diagnosis not present

## 2015-11-25 DIAGNOSIS — E11621 Type 2 diabetes mellitus with foot ulcer: Secondary | ICD-10-CM | POA: Insufficient documentation

## 2015-11-26 NOTE — Progress Notes (Signed)
[name] [name] ([id number]) Visit Report for 11/25/2015 Allergy List Details Patient Name: [name] [name] Date of Service: 11/25/2015 8:45 AM Medical Record Number: [id number] Patient Account Number: [id number] Date of Birth/Sex: Dec 23, 1940 (74 y.o. Male) Treating RN: Baruch Gouty, RN, BSN, Velva Harman Primary Care Physician: Thersa Salt Other Clinician: Referring Physician: Thersa Salt Treating Physician/Extender: Frann Rider in Treatment: 0 Allergies Active Allergies codeine Reaction: welps Severity: Severe Allergy Notes Electronic Signature(s) Signed: 11/25/2015 5:19:40 PM By: Regan Lemming BSN, RN Entered By: Regan Lemming on 11/25/2015 08:46:21 [name] [name] ([id number]) -------------------------------------------------------------------------------- Arrival Information Details Patient Name: [name] [name] Date of Service: 11/25/2015 8:45 AM Medical Record Number: [id number] Patient Account Number: [id number] Date of Birth/Sex: [birth year] (74 y.o. Male) Treating RN: Baruch Gouty, RN, BSN, Velva Harman Primary Care Physician: Thersa Salt Other Clinician: Referring Physician: Thersa Salt Treating Physician/Extender: Frann Rider in Treatment: 0 Visit Information Patient Arrived: Ambulatory Arrival Time: 08:45 Accompanied By: self Transfer Assistance: None Patient Identification Verified: Yes Secondary Verification Process Yes Completed: Patient Requires Transmission-Based No Precautions: Patient Has Alerts: No History Since Last Visit Added or deleted any medications: No Any new allergies or adverse reactions: No Had a fall or experienced change in activities of daily living that may affect risk of falls: No Signs or symptoms of abuse/neglect since last visito No Hospitalized since last visit: No Pain Present Now: No Electronic Signature(s) Signed: 11/25/2015 5:19:40 PM By: Regan Lemming BSN, RN Entered By: Regan Lemming on 11/25/2015 08:46:03 [name] [name]  ([id number]) -------------------------------------------------------------------------------- Clinic Level of Care Assessment Details Patient Name: [name] [name] Date of Service: 11/25/2015 8:45 AM Medical Record Number: [id number] Patient Account Number: [id number] Date of Birth/Sex: 04/07/1941 (74 y.o. Male) Treating RN: Baruch Gouty, RN, BSN, Velva Harman Primary Care Physician: Thersa Salt Other Clinician: Referring Physician: Thersa Salt Treating Physician/Extender: Frann Rider in Treatment: 0 Clinic Level of Care Assessment Items TOOL 2 Quantity Score []  - Use when only an EandM is performed on the INITIAL visit 0 ASSESSMENTS - Nursing Assessment / Reassessment X - General Physical Exam (combine w/ comprehensive assessment (listed just 1 20 below) when performed on new pt. evals) X - Comprehensive Assessment (HX, ROS, Risk Assessments, Wounds Hx, etc.) 1 25 ASSESSMENTS - Wound and Skin Assessment / Reassessment X - Simple Wound Assessment / Reassessment - one wound 1 5 []  - Complex Wound Assessment / Reassessment - multiple wounds 0 []  - Dermatologic / Skin Assessment (not related to wound area) 0 ASSESSMENTS - Ostomy and/or Continence Assessment and Care []  - Incontinence Assessment and Management 0 []  - Ostomy Care Assessment and Management (repouching, etc.) 0 PROCESS - Coordination of Care X - Simple Patient / Family Education for ongoing care 1 15 []  - Complex (extensive) Patient / Family Education for ongoing care 0 []  - Staff obtains Programmer, systems, Records, Test Results / Process Orders 0 []  - Staff telephones HHA, Nursing Homes / Clarify orders / etc 0 []  - Routine Transfer to another Facility (non-emergent condition) 0 []  - Routine Hospital Admission (non-emergent condition) 0 X - New Admissions / Biomedical engineer / Ordering NPWT, Apligraf, etc. 1 15 []  - Emergency Hospital Admission (emergent condition) 0 []  - Simple Discharge Coordination 0 CLYDIE, DELAMORA.  ([id number]) []  - Complex (extensive) Discharge Coordination 0 PROCESS - Special Needs []  - Pediatric / Minor Patient Management 0 []  - Isolation Patient Management 0 []  - Hearing / Language / Visual special needs 0 []  - Assessment of Community assistance (transportation, D/C planning, etc.) 0 []  - Additional  assistance / Altered mentation 0 []  - Support Surface(s) Assessment (bed, cushion, seat, etc.) 0 INTERVENTIONS - Wound Cleansing / Measurement X - Wound Imaging (photographs - any number of wounds) 1 5 []  - Wound Tracing (instead of photographs) 0 X - Simple Wound Measurement - one wound 1 5 []  - Complex Wound Measurement - multiple wounds 0 X - Simple Wound Cleansing - one wound 1 5 []  - Complex Wound Cleansing - multiple wounds 0 INTERVENTIONS - Wound Dressings X - Small Wound Dressing one or multiple wounds 1 10 []  - Medium Wound Dressing one or multiple wounds 0 []  - Large Wound Dressing one or multiple wounds 0 []  - Application of Medications - injection 0 INTERVENTIONS - Miscellaneous []  - External ear exam 0 []  - Specimen Collection (cultures, biopsies, blood, body fluids, etc.) 0 []  - Specimen(s) / Culture(s) sent or taken to Lab for analysis 0 []  - Patient Transfer (multiple staff / Civil Service fast streamer / Similar devices) 0 []  - Simple Staple / Suture removal (25 or less) 0 []  - Complex Staple / Suture removal (26 or more) 0 Papania, Loel W. ([id number]) []  - Hypo / Hyperglycemic Management (close monitor of Blood Glucose) 0 X - Ankle / Brachial Index (ABI) - do not check if billed separately 1 15 Has the patient been seen at the hospital within the last three years: Yes Total Score: 120 Level Of Care: New/Established - Level 4 Electronic Signature(s) Signed: 11/25/2015 5:19:40 PM By: Regan Lemming BSN, RN Entered By: Regan Lemming on 11/25/2015 09:16:54 [name] [name] ([id number]) -------------------------------------------------------------------------------- Encounter Discharge  Information Details Patient Name: [name] [name] Date of Service: 11/25/2015 8:45 AM Medical Record Number: [id number] Patient Account Number: [id number] Date of Birth/Sex: May 20, 1941 (74 y.o. Male) Treating RN: Baruch Gouty, RN, BSN, Velva Harman Primary Care Physician: Thersa Salt Other Clinician: Referring Physician: Thersa Salt Treating Physician/Extender: Frann Rider in Treatment: 0 Encounter Discharge Information Items Discharge Pain Level: 0 Discharge Condition: Stable Ambulatory Status: Ambulatory Discharge Destination: Home Transportation: Private Auto Accompanied By: self Schedule Follow-up Appointment: No Medication Reconciliation completed and provided to Patient/Care No Ebubechukwu Jedlicka: Provided on Clinical Summary of Care: 11/25/2015 Form Type Recipient Paper Patient RO Electronic Signature(s) Signed: 11/25/2015 9:32:34 AM By: Ruthine Dose Entered By: Ruthine Dose on 11/25/2015 09:32:33 [name] [name] ([id number]) -------------------------------------------------------------------------------- Lower Extremity Assessment Details Patient Name: [name] [name] Date of Service: 11/25/2015 8:45 AM Medical Record Number: [id number] Patient Account Number: [id number] Date of Birth/Sex: 25-Oct-1941 (74 y.o. Male) Treating RN: Baruch Gouty, RN, BSN, [place] Primary Care Physician: Thersa Salt Other Clinician: Referring Physician: Thersa Salt Treating Physician/Extender: Frann Rider in Treatment: 0 Edema Assessment Assessed: [Left: No] [Right: No] Edema: [Left: No] [Right: No] Calf Left: Right: Point of Measurement: 35 cm From Medial Instep 36 cm cm Ankle Left: Right: Point of Measurement: 11 cm From Medial Instep 23 cm cm Vascular Assessment Claudication: Claudication Assessment [Left:None] [Right:None] Pulses: Posterior Tibial Palpable: [Left:Yes] [Right:Yes] Doppler: [Left:Monophasic] [Right:Monophasic] Dorsalis Pedis Palpable: [Left:Yes] [Right:Yes] Extremity  colors, hair growth, and conditions: Extremity Color: [Left:Normal] [Right:Normal] Hair Growth on Extremity: [Left:Yes] [Right:Yes] Temperature of Extremity: [Left:Warm] [Right:Warm] Capillary Refill: [Left:< 3 seconds] [Right:< 3 seconds] Blood Pressure: Brachial: [Left:132] Dorsalis Pedis: 142 [Left:Dorsalis Pedis:] Ankle: Posterior Tibial: 138 [Left:Posterior Tibial: 1.08] Toe Nail Assessment Left: Right: Thick: No No Discolored: No No Deformed: No No CALLEN, RAWLING ([id number]) Improper Length and Hygiene: No No Electronic Signature(s) Signed: 11/25/2015 5:19:40 PM By: Regan Lemming BSN, RN Entered By: Regan Lemming on 11/25/2015 09:06:03 Zavadil,  KONNER BILSKI ([id number]) -------------------------------------------------------------------------------- Multi Wound Chart Details Patient Name: DORR, RASSO Date of Service: 11/25/2015 8:45 AM Medical Record Number: [id number] Patient Account Number: [id number] Date of Birth/Sex: 1941/07/14 (74 y.o. Male) Treating RN: Baruch Gouty, RN, BSN, Velva Harman Primary Care Physician: Thersa Salt Other Clinician: Referring Physician: Thersa Salt Treating Physician/Extender: Frann Rider in Treatment: 0 Vital Signs Height(in): 71 Pulse(bpm): 67 Weight(lbs): 176 Blood Pressure 132/69 (mmHg): Body Mass Index(BMI): 25 Temperature(F): 97.5 Respiratory Rate 16 (breaths/min): Photos: [2:No Photos] [N/A:N/A] Wound Location: [2:Left Calcaneous] [N/A:N/A] Wounding Event: [2:Pressure Injury] [N/A:N/A] Primary Etiology: [2:Diabetic Wound/Ulcer of the Lower Extremity] [N/A:N/A] Comorbid History: [2:Type II Diabetes] [N/A:N/A] Date Acquired: [2:03/23/2015] [N/A:N/A] Weeks of Treatment: [2:0] [N/A:N/A] Wound Status: [2:Open] [N/A:N/A] Measurements L x W x D 1x0.5x0.1 [N/A:N/A] (cm) Area (cm) : [2:0.393] [N/A:N/A] Volume (cm) : [2:0.039] [N/A:N/A] Classification: [2:Grade 0] [N/A:N/A] Exudate Amount: [2:Small] [N/A:N/A] Exudate Type: [2:Serous]  [N/A:N/A] Exudate Color: [2:amber] [N/A:N/A] Wound Margin: [2:Distinct, outline attached] [N/A:N/A] Granulation Amount: [2:Large (67-100%)] [N/A:N/A] Granulation Quality: [2:Red] [N/A:N/A] Necrotic Amount: [2:None Present (0%)] [N/A:N/A] Exposed Structures: [2:Fascia: No Fat: No Tendon: No Muscle: No Joint: No Bone: No Limited to Skin Breakdown] [N/A:N/A] Epithelialization: [2:None] [N/A:N/A] Periwound Skin Texture: Edema: No N/A N/A Excoriation: No Induration: No Callus: No Crepitus: No Fluctuance: No Friable: No Rash: No Scarring: No Periwound Skin Moist: Yes N/A N/A Moisture: Maceration: No Dry/Scaly: No Periwound Skin Color: Atrophie Blanche: No N/A N/A Cyanosis: No Ecchymosis: No Erythema: No Hemosiderin Staining: No Mottled: No Pallor: No Rubor: No Temperature: No Abnormality N/A N/A Tenderness on No N/A N/A Palpation: Wound Preparation: Ulcer Cleansing: N/A N/A Rinsed/Irrigated with Saline Topical Anesthetic Applied: Other: LIDOCAINE 4% Treatment Notes Electronic Signature(s) Signed: 11/25/2015 5:19:40 PM By: Regan Lemming BSN, RN Entered By: Regan Lemming on 11/25/2015 09:15:55 [name] [name] ([id number]) -------------------------------------------------------------------------------- [facility] Details Patient Name: [name] [name] Date of Service: 11/25/2015 8:45 AM Medical Record Number: [id number] Patient Account Number: [id number] Date of Birth/Sex: May 25, 1941 (74 y.o. Male) Treating RN: Baruch Gouty, RN, BSN, Velva Harman Primary Care Physician: Thersa Salt Other Clinician: Referring Physician: Thersa Salt Treating Physician/Extender: Frann Rider in Treatment: 0 Active Inactive Orientation to the Wound Care Program Nursing Diagnoses: Knowledge deficit related to the wound healing center program Goals: Patient/caregiver will verbalize understanding of the [facility] Program Date Initiated: 11/25/2015 Goal Status:  Active Interventions: Provide education on orientation to the wound center Notes: Wound/Skin Impairment Nursing Diagnoses: Impaired tissue integrity Knowledge deficit related to ulceration/compromised skin integrity Goals: Patient/caregiver will verbalize understanding of skin care regimen Date Initiated: 11/25/2015 Goal Status: Active Ulcer/skin breakdown will have a volume reduction of 30% by week 4 Date Initiated: 11/25/2015 Goal Status: Active Ulcer/skin breakdown will have a volume reduction of 50% by week 8 Date Initiated: 11/25/2015 Goal Status: Active Ulcer/skin breakdown will have a volume reduction of 80% by week 12 Date Initiated: 11/25/2015 Goal Status: Active Ulcer/skin breakdown will heal within 14 weeks Date Initiated: 11/25/2015 [name] [name] ([id number]) Goal Status: Active Interventions: Assess patient/caregiver ability to perform ulcer/skin care regimen upon admission and as needed Assess ulceration(s) every visit Provide education on ulcer and skin care Notes: Electronic Signature(s) Signed: 11/25/2015 5:19:40 PM By: Regan Lemming BSN, RN Entered By: Regan Lemming on 11/25/2015 09:15:41 [name] [name] ([id number]) -------------------------------------------------------------------------------- Pain Assessment Details Patient Name: [name] [name] Date of Service: 11/25/2015 8:45 AM Medical Record Number: [id number] Patient Account Number: [id number] Date of Birth/Sex: 10-Mar-1941 (74 y.o. Male) Treating RN: Baruch Gouty, RN, BSN, [place] Primary Care Physician: Lacinda Axon,  JAYCE Other Clinician: Referring Physician: Thersa Salt Treating Physician/Extender: Frann Rider in Treatment: 0 Active Problems Location of Pain Severity and Description of Pain Patient Has Paino No Site Locations Pain Management and Medication Current Pain Management: Electronic Signature(s) Signed: 11/25/2015 5:19:40 PM By: Regan Lemming BSN, RN Entered By: Regan Lemming on 11/25/2015  08:46:13 [name] [name] ([id number]) -------------------------------------------------------------------------------- Patient/Caregiver Education Details Patient Name: [name] [name] Date of Service: 11/25/2015 8:45 AM Medical Record Number: [id number] Patient Account Number: [id number] Date of Birth/Gender: January 11, 1941 (74 y.o. Male) Treating RN: Baruch Gouty, RN, BSN, [place] Primary Care Physician: Thersa Salt Other Clinician: Referring Physician: Thersa Salt Treating Physician/Extender: Frann Rider in Treatment: 0 Education Assessment Education Provided To: Patient Education Topics Provided Welcome To [facility]: Methods: Explain/Verbal Responses: State content correctly Wound/Skin Impairment: Methods: Explain/Verbal Responses: State content correctly Electronic Signature(s) Signed: 11/25/2015 5:19:40 PM By: Regan Lemming BSN, RN Entered By: Regan Lemming on 11/25/2015 09:33:28 [name] [name] ([id number]) -------------------------------------------------------------------------------- Wound Assessment Details Patient Name: [name] [name] Date of Service: 11/25/2015 8:45 AM Medical Record Number: [id number] Patient Account Number: [id number] Date of Birth/Sex: 08-04-1941 (74 y.o. Male) Treating RN: Baruch Gouty, RN, BSN, [place] Primary Care Physician: Thersa Salt Other Clinician: Referring Physician: Thersa Salt Treating Physician/Extender: Frann Rider in Treatment: 0 Wound Status Wound Number: 2 Primary Diabetic Wound/Ulcer of the Lower Etiology: Extremity Wound Location: Left Calcaneous Wound Status: Open Wounding Event: Pressure Injury Comorbid Type II Diabetes Date Acquired: 03/23/2015 History: Weeks Of Treatment: 0 Clustered Wound: No Photos Photo Uploaded By: Regan Lemming on 11/25/2015 16:38:26 Wound Measurements Length: (cm) 1 Width: (cm) 0.5 Depth: (cm) 0.1 Area: (cm) 0.393 Volume: (cm) 0.039 % Reduction in Area: % Reduction in  Volume: Epithelialization: None Tunneling: No Undermining: No Wound Description Classification: Grade 0 Wound Margin: Distinct, outline attached Exudate Amount: Small Exudate Type: Serous Exudate Color: amber TIMOFEY, ESSARY ([id number]) Foul Odor After Cleansing: No Wound Bed Granulation Amount: Large (67-100%) Exposed Structure Granulation Quality: Red Fascia Exposed: No Necrotic Amount: None Present (0%) Fat Layer Exposed: No Tendon Exposed: No Muscle Exposed: No Joint Exposed: No Bone Exposed: No Limited to Skin Breakdown Periwound Skin Texture Texture Color No Abnormalities Noted: No No Abnormalities Noted: No Callus: No Atrophie Blanche: No Crepitus: No Cyanosis: No Excoriation: No Ecchymosis: No Fluctuance: No Erythema: No Friable: No Hemosiderin Staining: No Induration: No Mottled: No Localized Edema: No Pallor: No Rash: No Rubor: No Scarring: No Temperature / Pain Moisture Temperature: No Abnormality No Abnormalities Noted: No Dry / Scaly: No Maceration: No Moist: Yes Wound Preparation Ulcer Cleansing: Rinsed/Irrigated with Saline Topical Anesthetic Applied: Other: LIDOCAINE 4%, Treatment Notes Wound #2 (Left Calcaneous) 1. Cleansed with: Clean wound with Normal Saline 3. Peri-wound Care: Moisturizing lotion 4. Dressing Applied: Mepitel 5. Secondary Dressing Applied Gauze and Kerlix/Conform 7. Secured with [employer] to Left Lower Extremity Notes GRAIDEN, CLAUSSEN ([id number]) mepitel lite Electronic Signature(s) Signed: 11/25/2015 5:19:40 PM By: Regan Lemming BSN, RN Entered By: Regan Lemming on 11/25/2015 08:59:29 [name] [name] ([id number]) -------------------------------------------------------------------------------- Vitals Details Patient Name: [name] [name] Date of Service: 11/25/2015 8:45 AM Medical Record Number: [id number] Patient Account Number: [id number] Date of Birth/Sex: 03/27/1941 (74 y.o. Male) Treating RN: Baruch Gouty, RN,  BSN, Velva Harman Primary Care Physician: Thersa Salt Other Clinician: Referring Physician: Thersa Salt Treating Physician/Extender: Frann Rider in Treatment: 0 Vital Signs Time Taken: 08:51 Temperature (F): 97.5 Height (in): 71 Pulse (bpm): 67 Source: Stated Respiratory Rate (breaths/min): 16 Weight (lbs): 176 Blood Pressure (mmHg):  132/69 Source: Stated Reference Range: 80 - 120 mg / dl Body Mass Index (BMI): 24.5 Electronic Signature(s) Signed: 11/25/2015 5:19:40 PM By: Regan Lemming BSN, RN Entered By: Regan Lemming on 11/25/2015 09:02:53

## 2015-11-26 NOTE — Progress Notes (Signed)
JINU, HOKE ([id number]) Visit Report for 11/25/2015 Abuse/Suicide Risk Screen Details Patient Name: [name] [name] Date of Service: 11/25/2015 8:45 AM Medical Record Number: [id number] Patient Account Number: [id number] Date of Birth/Sex: 09/27/1941 (74 y.o. Male) Treating RN: Baruch Gouty, RN, BSN, Velva Harman Primary Care Physician: Thersa Salt Other Clinician: Referring Physician: Thersa Salt Treating Physician/Extender: Frann Rider in Treatment: 0 Abuse/Suicide Risk Screen Items Answer ABUSE/SUICIDE RISK SCREEN: Has anyone close to you tried to hurt or harm you recentlyo No Do you feel uncomfortable with anyone in your familyo No Has anyone forced you do things that you didnot want to doo No Do you have any thoughts of harming yourselfo No Patient displays signs or symptoms of abuse and/or neglect. No Electronic Signature(s) Signed: 11/25/2015 5:19:40 PM By: Regan Lemming BSN, RN Entered By: Regan Lemming on 11/25/2015 08:50:03 [name] [name] ([id number]) -------------------------------------------------------------------------------- Activities of Daily Living Details Patient Name: [name] [name] Date of Service: 11/25/2015 8:45 AM Medical Record Number: [id number] Patient Account Number: [id number] Date of Birth/Sex: 05-15-1941 (74 y.o. Male) Treating RN: Baruch Gouty, RN, BSN, Velva Harman Primary Care Physician: Thersa Salt Other Clinician: Referring Physician: Thersa Salt Treating Physician/Extender: Frann Rider in Treatment: 0 Activities of Daily Living Items Answer Activities of Daily Living (Please select one for each item) Drive Automobile Completely Able Take Medications Completely Able Use Telephone Completely Able Care for Appearance Completely Able Use Toilet Completely Able Bath / Shower Completely Able Dress Self Completely Able Feed Self Completely Able Walk Completely Able Get In / Out Bed Completely Able Housework Completely Able Prepare Meals Completely  [place] for Self Completely Able Electronic Signature(s) Signed: 11/25/2015 5:19:40 PM By: Regan Lemming BSN, RN Entered By: Regan Lemming on 11/25/2015 08:49:47 [name] [name] ([id number]) -------------------------------------------------------------------------------- Education Assessment Details Patient Name: [name] [name] Date of Service: 11/25/2015 8:45 AM Medical Record Number: [id number] Patient Account Number: [id number] Date of Birth/Sex: Feb 27, 1941 (74 y.o. Male) Treating RN: Baruch Gouty, RN, BSN, Velva Harman Primary Care Physician: Thersa Salt Other Clinician: Referring Physician: Thersa Salt Treating Physician/Extender: Frann Rider in Treatment: 0 Primary Learner Assessed: Patient Learning Preferences/Education Level/Primary Language Learning Preference: Explanation Highest Education Level: College or Above Preferred Language: English Cognitive Barrier Assessment/Beliefs Language Barrier: No Physical Barrier Assessment Impaired Vision: No Impaired Hearing: No Decreased Hand dexterity: No Knowledge/Comprehension Assessment Knowledge Level: High Comprehension Level: High Ability to understand written High instructions: Ability to understand verbal High instructions: Motivation Assessment Anxiety Level: Calm Cooperation: Cooperative Education Importance: Acknowledges Need Interest in Health Problems: Asks Questions Perception: Coherent Willingness to Engage in Self- High Management Activities: Readiness to Engage in Self- High Management Activities: Electronic Signature(s) Signed: 11/25/2015 5:19:40 PM By: Regan Lemming BSN, RN Entered By: Regan Lemming on 11/25/2015 08:49:23 [name] [name] ([id number]) -------------------------------------------------------------------------------- Fall Risk Assessment Details Patient Name: [name] [name] Date of Service: 11/25/2015 8:45 AM Medical Record Number: [id number] Patient Account  Number: [id number] Date of Birth/Sex: 1941/08/18 (74 y.o. Male) Treating RN: Baruch Gouty, RN, BSN, [place] Primary Care Physician: Thersa Salt Other Clinician: Referring Physician: Thersa Salt Treating Physician/Extender: Frann Rider in Treatment: 0 Fall Risk Assessment Items Have you had 2 or more falls in the last 12 monthso 0 No Have you had any fall that resulted in injury in the last 12 monthso 0 No FALL RISK ASSESSMENT: History of falling - immediate or within 3 months 0 No Secondary diagnosis 0 No Ambulatory aid None/bed rest/wheelchair/nurse 0 Yes Crutches/cane/walker 0 No Furniture 0 No IV Access/Saline Lock 0  No Gait/Training Normal/bed rest/immobile 0 Yes Weak 0 No Impaired 0 No Mental Status Oriented to own ability 0 Yes Electronic Signature(s) Signed: 11/25/2015 5:19:40 PM By: Regan Lemming BSN, RN Entered By: Regan Lemming on 11/25/2015 08:48:52 [name] [name] ([id number]) -------------------------------------------------------------------------------- Foot Assessment Details Patient Name: [name] [name] Date of Service: 11/25/2015 8:45 AM Medical Record Number: [id number] Patient Account Number: [id number] Date of Birth/Sex: [birth year] (74 y.o. Male) Treating RN: Baruch Gouty, RN, BSN, [place] Primary Care Physician: Thersa Salt Other Clinician: Referring Physician: Thersa Salt Treating Physician/Extender: Frann Rider in Treatment: 0 Foot Assessment Items Site Locations + = Sensation present, - = Sensation absent, C = Callus, U = Ulcer R = Redness, W = Warmth, M = Maceration, PU = Pre-ulcerative lesion F = Fissure, S = Swelling, D = Dryness Assessment Right: Left: Other Deformity: No No Prior Foot Ulcer: No No Prior Amputation: No No Charcot Joint: No No Ambulatory Status: Ambulatory Without Help Gait: Steady Electronic Signature(s) Signed: 11/25/2015 5:19:40 PM By: Regan Lemming BSN, RN Entered By: Regan Lemming on 11/25/2015 [id number] [name] [name]  ([id number]) -------------------------------------------------------------------------------- Nutrition Risk Assessment Details Patient Name: [name] [name] Date of Service: 11/25/2015 8:45 AM Medical Record Number: [id number] Patient Account Number: [id number] Date of Birth/Sex: [birth year] (74 y.o. Male) Treating RN: Baruch Gouty, RN, BSN, [place] Primary Care Physician: Thersa Salt Other Clinician: Referring Physician: Thersa Salt Treating Physician/Extender: Frann Rider in Treatment: 0 Height (in): 71 Weight (lbs): 180 Body Mass Index (BMI): 25.1 Nutrition Risk Assessment Items NUTRITION RISK SCREEN: I have an illness or condition that made me change the kind and/or 0 No amount of food I eat I eat fewer than two meals per day 0 No I eat few fruits and vegetables, or milk products 0 No I have three or more drinks of beer, liquor or wine almost every day 0 No I have tooth or mouth problems that make it hard for me to eat 0 No I don't always have enough money to buy the food I need 0 No I eat alone most of the time 0 No I take three or more different prescribed or over-the-counter drugs a 0 No day Without wanting to, I have lost or gained 10 pounds in the last six 0 No months I am not always physically able to shop, cook and/or feed myself 0 No Nutrition Protocols Good Risk Protocol 0 No interventions needed Moderate Risk Protocol Electronic Signature(s) Signed: 11/25/2015 5:19:40 PM By: Regan Lemming BSN, RN Entered By: Regan Lemming on 11/25/2015 08:48:35

## 2015-11-26 NOTE — Progress Notes (Signed)
[name] [name] ([id number]) Visit Report for 11/25/2015 Chief Complaint Document Details Patient Name: [name] [name] Date of Service: 11/25/2015 8:45 AM Medical Record Number: [id number] Patient Account Number: [id number] Date of Birth/Sex: 1941/10/02 (74 y.o. Male) Treating RN: Baruch Gouty, RN, BSN, Velva Harman Primary Care Physician: Thersa Salt Other Clinician: Referring Physician: Thersa Salt Treating Physician/Extender: Frann Rider in Treatment: 0 Information Obtained from: Patient Chief Complaint Patients presents for treatment of an open diabetic ulcer to the left heel on the posterior- lateral part of the heel. This is a recurrent swelling and ulceration for about 3 weeks now. Electronic Signature(s) Signed: 11/25/2015 9:41:02 AM By: Christin Fudge MD, FACS Entered By: Christin Fudge on 11/25/2015 09:41:02 [name] [name] ([id number]) -------------------------------------------------------------------------------- Debridement Details Patient Name: [name] [name] Date of Service: 11/25/2015 8:45 AM Medical Record Number: [id number] Patient Account Number: [id number] Date of Birth/Sex: [birth year] (74 y.o. Male) Treating RN: Afful, RN, BSN, Lehr Primary Care Physician: Thersa Salt Other Clinician: Referring Physician: Thersa Salt Treating Physician/Extender: Frann Rider in Treatment: 0 Debridement Performed for Wound #2 Left Calcaneous Assessment: Performed By: Physician Christin Fudge, MD Debridement: Open Wound/Selective Debridement Selective Description: Pre-procedure Yes Verification/Time Out Taken: Start Time: 09:30 Pain Control: Lidocaine 5% topical ointment Level: Skin/Dermis Total Area Debrided (L x 1 (cm) x 0.5 (cm) = 0.5 (cm) W): Tissue and other Non-Viable, Callus, Eschar material debrided: Instrument: Forceps Bleeding: Minimum Hemostasis Achieved: Pressure End Time: 09:33 Procedural Pain: 0 Post Procedural Pain: 0 Response to Treatment:  Procedure was tolerated well Post Debridement Measurements of Total Wound Length: (cm) 1 Width: (cm) 0.5 Depth: (cm) 0.1 Volume: (cm) 0.039 Post Procedure Diagnosis Same as Pre-procedure Electronic Signature(s) Signed: 11/25/2015 9:39:28 AM By: Christin Fudge MD, FACS Signed: 11/25/2015 5:19:40 PM By: Regan Lemming BSN, RN Entered By: Christin Fudge on 11/25/2015 09:39:28 [name] [name] ([id number]) -------------------------------------------------------------------------------- HPI Details Patient Name: [name] [name] Date of Service: 11/25/2015 8:45 AM Medical Record Number: [id number] Patient Account Number: [id number] Date of Birth/Sex: 1941-07-23 (74 y.o. Male) Treating RN: Baruch Gouty, RN, BSN, Landa Primary Care Physician: Thersa Salt Other Clinician: Referring Physician: Thersa Salt Treating Physician/Extender: Frann Rider in Treatment: 0 History of Present Illness Location: left posterior lateral heel Quality: Patient reports No Pain. Severity: Patient states wound (s) are getting better. Duration: Patient has had the wound for < 4 weeks prior to presenting for treatment Context: The wound occurred when the patient had an abrasion while pulling on stockings Modifying Factors: Other treatment(s) tried include:compression with unknown as both Associated Signs and Symptoms: Patient reports having increase swelling of his left lower extremity.Marland Kitchen HPI Description: 74 year old gentleman who is known to me from a previous wound care consultation and follow-up between 03/23/2015 and 09/07/2015. During that time he had a ulceration on the left lateral heel which was treated with conservative therapy including 5 applications of Epifix. The patient kept having a recurrence of this ulceration and went for a second opinion to [facility] and was seen there by Dr. Martyn Malay. 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 X-ray of the foot showed no erosions. He was treated with Unna boot and silver nitrate and healed completely on 10/19/2015. This was his recurrence after about 3 weeks now. This time around he was also referred to Dr. Hal Hope the vascular surgeon for possible endovenous ablation.  I do not have this official report but as for the patient's verbal report the vascular surgeon did a complete workup and did not find any surgical intervention which was going to help him and suggested that this a typical ulceration is possibly not due to venous incompetence. I have asked for this report. Past medical history -- known to be a diabetic on treatment for the last 30 years and takes insulin for this. He's been a nonsmoker all his life and hasn't had no problems with any arterial disease and has had the saphenous vein harvesting on both legs in the past. 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 He had 5 applications of a prefix starting 06/29/2015 -- 0000000 - final application of a Epifix. Electronic Signature(s) Signed: 11/25/2015 9:49:00 AM By: Christin Fudge MD, FACS Entered By: Christin Fudge on 11/25/2015 09:49:00 [name] [name] ([id number]) -------------------------------------------------------------------------------- Physical Exam Details Patient Name: [name] [name] Date of Service: 11/25/2015 8:45 AM Medical Record Number: [id number] Patient Account Number: [id number] Date of Birth/Sex: Dec 23, 1940 (74 y.o. Male) Treating RN: Baruch Gouty, RN, BSN, Velva Harman Primary Care Physician: Thersa Salt Other Clinician: Referring Physician: Thersa Salt Treating Physician/Extender: Frann Rider in Treatment: 0 Constitutional . Pulse regular. Respirations normal and unlabored. Afebrile. . Eyes Nonicteric. Reactive to light. Ears, Nose, Mouth, and Throat Lips, teeth, and gums WNL.Marland Kitchen Moist mucosa without lesions . Neck supple and nontender. No palpable supraclavicular or cervical adenopathy. Normal sized without goiter. Respiratory WNL. No retractions.. Cardiovascular Pedal Pulses WNL. ABI on the left was 1.08. he has stage I lymphedema of the left lower extremity. Lymphatic No adneopathy. No adenopathy. No adenopathy. Musculoskeletal Adexa without tenderness or enlargement.. Digits and nails w/o clubbing, cyanosis, infection, petechiae, ischemia, or inflammatory conditions.. Integumentary (Hair, Skin) No suspicious lesions. No crepitus or fluctuance. No peri-wound warmth or erythema. No masses.Marland Kitchen Psychiatric Judgement and insight Intact.. No evidence of depression, anxiety, or agitation.. Notes the patient has stage I lymphedema of the left lower extremity and no evidence of gross venous ulceration, stigmata of venous disease but has less 1 pitting edema. The ulceration on his left lateral heel is in the same position as before and has some clean granulation tissue with evidence of some eschar and callus which will be debrided superficially. Electronic Signature(s) Signed: 11/25/2015 9:51:14 AM By: Christin Fudge MD, FACS Entered By: Christin Fudge on 11/25/2015 09:51:14 [name] [name] ([id number]) -------------------------------------------------------------------------------- Physician Orders Details Patient Name: [name] [name] Date of Service: 11/25/2015 8:45 AM Medical Record Number: [id number] Patient Account Number: [id number] Date of Birth/Sex: [birth year] (74 y.o. Male) Treating RN: Baruch Gouty, RN, BSN, Velva Harman Primary Care Physician: Thersa Salt Other Clinician: Referring Physician: Thersa Salt Treating Physician/Extender: Frann Rider in Treatment:  0 Verbal / Phone Orders: Yes Clinician: Afful, RN, BSN, Rita Read Back and Verified: Yes Diagnosis Coding Wound Cleansing Wound #2 Left Calcaneous o Clean wound with Normal Saline. Anesthetic Wound #2 Left Calcaneous o Topical Lidocaine 4% cream applied to wound bed prior to debridement Primary Wound Dressing Wound #2 Left Calcaneous o Other: - Mepitel 1 Secondary Dressing Wound #2 Left Calcaneous o Gauze and Kerlix/Conform Dressing Change Frequency Wound #2 Left Calcaneous o Change dressing every week Follow-up Appointments Wound #2 Left Calcaneous o Return Appointment in 1 week. Edema Control Wound #2  Left Calcaneous o Unna Boot to Left Lower Extremity Electronic Signature(s) Signed: 11/25/2015 4:24:56 PM By: Christin Fudge MD, FACS Signed: 11/25/2015 5:19:40 PM By: Regan Lemming BSN, RN Entered By: Regan Lemming on 11/25/2015 [id number] [name] [name] ([name] ([id number]) -------------------------------------------------------------------------------- Problem List Details Patient Name: [name] [name] Date of Service: 11/25/2015 8:45 AM Medical Record Number: [id number] Patient Account Number: [id number] Date of Birth/Sex: [birth year] (74 y.o. 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 This is a recurrent swelling and ulceration for about 3 weeks now. History of Present Illness (HPI) The following HPI elements were documented for the patient's wound: Location: left posterior lateral heel Quality: Patient reports No Pain. Severity: Patient states wound (s) are getting better. Duration: Patient has had the wound for < 4 weeks prior to presenting for treatment Context: The wound occurred when the patient had an abrasion while pulling on stockings Modifying Factors: Other treatment(s) tried include:compression with unknown as both Associated Signs and Symptoms: Patient reports having increase swelling of his left lower extremity.. 74 year old gentleman who is known to me from a previous wound care consultation and follow-up between 03/23/2015 and 09/07/2015. During that time he had a ulceration on the left lateral heel which was treated with conservative therapy including 5 applications of Epifix. The patient kept having a recurrence of this ulceration and went for a second opinion to [facility] and was seen there by Dr. Martyn Malay. 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 X-ray of the foot showed no erosions. He was treated with Unna boot and silver nitrate and healed completely on 10/19/2015. This was his recurrence after about 3 weeks now. This time around he was also referred to Dr. Hal Hope the vascular surgeon for possible endovenous ablation. I do not have this official report but as for the patient's verbal report the vascular surgeon did a complete workup and did not find any surgical intervention which was going to help him and suggested that this a typical ulceration is possibly not due to venous incompetence. I have asked for this report. Past medical history -- known to be a diabetic on treatment for the last 30 years and takes insulin for this. He's been a nonsmoker all his life and  hasn't had no problems with any arterial disease and has had the saphenous vein harvesting on both legs in the past. 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 He had 5 applications of a prefix starting 06/29/2015 -- 0000000 - final application of a Epifix. MATE, RODEFFER ([id number]) Wound History Patient presents with 1 open wound that has been present for approximately months. Laboratory tests have not been performed in the last month. Patient reportedly has not tested positive for an antibiotic resistant organism. Patient History Information obtained from Patient. Allergies codeine (Severity: Severe, Reaction: welps) Family History Diabetes - Siblings, Heart Disease - Siblings, Thyroid Problems - Mother, No family history of Cancer, Hypertension, Kidney Disease, Lung Disease, Stroke. Social History Never smoker, Marital Status - Married, Alcohol Use - Daily,  Drug Use - No History, Caffeine Use - Daily. Medical History Ear/Nose/Mouth/Throat Denies history of Chronic sinus problems/congestion, Middle ear problems Hematologic/Lymphatic Denies history of Anemia, Hemophilia, Human Immunodeficiency Virus, Lymphedema, Sickle Cell Disease Respiratory Denies history of Aspiration, Asthma, Chronic Obstructive Pulmonary Disease (COPD), Pneumothorax, Sleep Apnea, Tuberculosis Cardiovascular Denies history of Angina, Arrhythmia, Congestive Heart Failure, Coronary Artery Disease, Deep Vein Thrombosis, Hypertension, Hypotension, Myocardial Infarction, Peripheral Arterial Disease, Peripheral Venous Disease, Phlebitis, Vasculitis Gastrointestinal Denies history of Cirrhosis , Colitis, Crohn s, Hepatitis A, Hepatitis B, Hepatitis C Endocrine Patient has history of Type II Diabetes Genitourinary Denies history of End Stage Renal Disease Immunological Denies history of Lupus Erythematosus, Raynaud s, Scleroderma Integumentary (Skin) Denies history of History of Burn, History of pressure wounds Musculoskeletal Denies history of Gout, Rheumatoid Arthritis, Osteoarthritis, Osteomyelitis Neurologic Denies history of Dementia, Neuropathy, Quadriplegia, Paraplegia, Seizure Disorder Oncologic Denies history of Received Chemotherapy, Received Radiation Psychiatric Denies history of Anorexia/bulimia, Confinement Anxiety SYNCERE, KATZENSTEIN. ([id number]) Patient is treated with Insulin. Blood sugar is tested. Blood sugar results noted at the following times: Breakfast - 112. 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 Ear/Nose/Mouth/Throat The patient has no complaints or symptoms. Respiratory The patient has no complaints or symptoms. Cardiovascular The patient has no complaints or  symptoms. Gastrointestinal The patient has no complaints or symptoms. Endocrine The patient has no complaints or symptoms. Genitourinary The patient has no complaints or symptoms. Immunological The patient has no complaints or symptoms. Integumentary (Skin) Complains or has symptoms of Wounds. Musculoskeletal The patient has no complaints or symptoms. Neurologic The patient has no complaints or symptoms. Oncologic The patient has no complaints or symptoms. Psychiatric The patient has no complaints or symptoms. Medications: I have reviewed her list of his medications from his last visit and nothing else has changed. Objective Constitutional Pulse regular. Respirations normal and unlabored. Afebrile. NEZIAH, MATHIEU ([id number]) Vitals Time Taken: 8:51 AM, Height: 71 in, Source: Stated, Weight: 176 lbs, Source: Stated, BMI: 24.5, Temperature: 97.5 F, Pulse: 67 bpm, Respiratory Rate: 16 breaths/min, Blood Pressure: 132/69 mmHg. Eyes Nonicteric. Reactive to light. Ears, Nose, Mouth, and Throat Lips, teeth, and gums WNL.Marland Kitchen Moist mucosa without lesions . Neck supple and nontender. No palpable supraclavicular or cervical adenopathy. Normal sized without goiter. Respiratory WNL. No retractions.. Cardiovascular Pedal Pulses WNL. ABI on the left was 1.08. he has stage I  lymphedema of the left lower extremity. Lymphatic No adneopathy. No adenopathy. No adenopathy. Musculoskeletal Adexa without tenderness or enlargement.. Digits and nails w/o clubbing, cyanosis, infection, petechiae, ischemia, or inflammatory conditions.Marland Kitchen Psychiatric Judgement and insight Intact.. No evidence of depression, anxiety, or agitation.. General Notes: the patient has stage I lymphedema of the left lower extremity and no evidence of gross venous ulceration, stigmata of venous disease but has less 1 pitting edema. The ulceration on his left lateral heel is in the same position as before and has some clean  granulation tissue with evidence of some eschar and callus which will be debrided superficially. Integumentary (Hair, Skin) No suspicious lesions. No crepitus or fluctuance. No peri-wound warmth or erythema. No masses.. Wound #2 status is Open. Original cause of wound was Pressure Injury. The wound is located on the Left Calcaneous. The wound measures 1cm length x 0.5cm width x 0.1cm depth; 0.393cm^2 area and 0.039cm^3 volume. The wound is limited to skin breakdown. There is no tunneling or undermining noted. There is a small amount of serous drainage noted. The wound margin is distinct with the outline attached to the wound base. There is large (67-100%) red granulation within the wound bed. There is no necrotic tissue within the wound bed. The periwound skin appearance exhibited: Moist. The periwound skin appearance did not exhibit: Callus, Crepitus, Excoriation, Fluctuance, Friable, Induration, Localized Edema, Rash, Scarring, Dry/Scaly, Maceration, Atrophie Blanche, Cyanosis, Ecchymosis, Hemosiderin Staining, Mottled, Pallor, Rubor, Erythema. Periwound temperature was noted as No Abnormality. 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 He had good resolution to his problem( after starting treatment at [facility]) in about 6 weeks with an Unna's boot but once this compression therapy was stopped he recurred in about 3 weeks' time. I believe this patient's problem is a not a pure venous issue but has stage I  lymphedema, which has not responded very well to exercise, elevation and class I compression stockings. He still has stage I edema and a active ulceration of his left heel which is rather atypical. At present I have recommended a Mepitel light dressing over the ulceration and an Unna's boot was to be applied to his left lower extremity. He has some juxtalight stockings ordered for him and will bring them to [place] during his next visit. Due to the fact that he's had this ulceration and edema for over 9 months now in spite of compression, exercise and elevation he continues to have problems and at this stage may benefit from lymphedema pumps to be used twice a day in addition to his conservator therapy. The patient will see [place] back next week. Procedures Wound #2 Wound #2 is a Diabetic Wound/Ulcer of the Lower Extremity located on the Left Calcaneous . There was a Skin/Dermis Open Wound/Selective [phone]) debridement with total area of 0.5 sq cm performed by Christin Fudge, MD. with the following instrument(s): Forceps to remove Non-Viable tissue/material including Eschar and Callus after achieving pain control using Lidocaine 5% topical ointment. A time out was conducted prior to the start of the procedure. A Minimum  amount of bleeding was controlled with Pressure. The procedure was tolerated well with a pain level of 0 throughout and a pain level of 0 following the procedure. Post Debridement Measurements: 1cm length x 0.5cm width x 0.1cm depth; 0.039cm^3 volume. Post procedure Diagnosis Wound #2: Same as Pre-Procedure YAAKOV, SEPPALA. ([id number]) Plan Wound Cleansing: Wound #2 Left Calcaneous: Clean wound with Normal Saline. Anesthetic: Wound #2 Left Calcaneous: Topical Lidocaine 4% cream applied to wound bed prior to debridement Primary Wound Dressing: Wound #2 Left Calcaneous: Other: - Mepitel 1 Secondary Dressing: Wound #2 Left Calcaneous: Gauze and Kerlix/Conform Dressing Change  Frequency: Wound #2 Left Calcaneous: Change dressing every week Follow-up Appointments: Wound #2 Left Calcaneous: Return Appointment in 1 week. Edema Control: Wound #2 Left Calcaneous: Unna Boot to Left Lower Extremity This 74 year old gentleman who is known to me from a previous visit now has a recurrent left heel ulceration which is very atypical for chronic venous hypertension or chronic venous disease. He had good resolution to his problem( after starting treatment at [facility]) in about 6 weeks with an Unna's boot but once this compression therapy was stopped he recurred in about 3 weeks' time. I believe this patient's problem is a not a pure venous issue but has stage I lymphedema, which has not responded very well to exercise, elevation and class I compression stockings. He still has stage I edema and a active ulceration of his left heel which is rather atypical. At present I have recommended a Mepitel light dressing over the ulceration and an Unna's boot was to be applied to his left lower extremity. He has some juxtalight stockings ordered for him and will bring them to [place] during his next visit. Due to the fact that he's had this ulceration and edema for over 9 months now in spite of compression, exercise and elevation he continues to have problems and at this stage may benefit from lymphedema pumps to be used twice a day in addition to his conservator therapy. LIRIDON, BOHLEN ([id number]) The patient will see [place] back next week. Electronic Signature(s) Signed: 11/25/2015 9:59:43 AM By: Christin Fudge MD, FACS Entered By: Christin Fudge on 11/25/2015 09:59:43 [name] [name] ([id number]) -------------------------------------------------------------------------------- ROS/PFSH Details Patient Name: [name] [name] Date of Service: 11/25/2015 8:45 AM Medical Record Number: [id number] Patient Account Number: [id number] Date of Birth/Sex: 10/04/1941 (74 y.o. Male) Treating  RN: Afful, RN, BSN, Powers Primary Care Physician: Thersa Salt Other Clinician: Referring Physician: Thersa Salt Treating Physician/Extender: Frann Rider in Treatment: 0 Information Obtained From Patient Wound History Do you currently have one or more open woundso Yes How many open wounds do you currently haveo 1 Approximately how long have you had your woundso months Has your wound(s) ever healed and then re-openedo No Have you had any lab work done in the past montho No Have you tested positive for an antibiotic resistant organism (MRSA, VRE)o No Integumentary (Skin) Complaints and Symptoms: Positive for: Wounds Medical History: Negative for: History of Burn; History of pressure wounds Constitutional Symptoms (General Health) Medical History: Past Medical History Notes: 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, 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; Diabetic Eyes Complaints and Symptoms: No Complaints or Symptoms Medical History: Negative for: Cataracts; Glaucoma; Optic Neuritis Ear/Nose/Mouth/Throat Complaints and Symptoms: No Complaints or Symptoms Medical History: Negative for: Chronic sinus problems/congestion; Middle ear problems Hematologic/Lymphatic DRELYN, ADDY ([id number]) Medical History: Negative for: Anemia; Hemophilia; Human Immunodeficiency Virus; Lymphedema; Sickle Cell Disease Respiratory Complaints and Symptoms: No Complaints or Symptoms  Medical History: Negative for: Aspiration; Asthma; Chronic Obstructive Pulmonary Disease (COPD); Pneumothorax; Sleep Apnea; Tuberculosis Cardiovascular Complaints and Symptoms: No Complaints or Symptoms Medical History: Negative for: Angina; Arrhythmia; Congestive Heart Failure; Coronary Artery Disease; Deep Vein Thrombosis; Hypertension; Hypotension; Myocardial Infarction; Peripheral Arterial Disease; Peripheral Venous Disease; Phlebitis; Vasculitis Gastrointestinal Complaints and Symptoms: No Complaints or Symptoms Medical  History: Negative for: Cirrhosis ; Colitis; Crohnos; Hepatitis A; Hepatitis B; Hepatitis C Endocrine Complaints and Symptoms: No Complaints or Symptoms Medical History: Positive for: Type II Diabetes Time with diabetes: 30 years Treated with: Insulin Blood sugar tested every day: Yes Tested : twice daily Blood sugar testing results: Breakfast: 112 Genitourinary Complaints and Symptoms: No Complaints or Symptoms Medical History: Negative for: End Stage Renal Disease HIEU, RIEKEN. ([id number]) Immunological Complaints and Symptoms: No Complaints or Symptoms Medical History: Negative for: Lupus Erythematosus; Raynaudos; Scleroderma Musculoskeletal Complaints and Symptoms: No Complaints or Symptoms Medical History: Negative for: Gout; Rheumatoid Arthritis; Osteoarthritis; Osteomyelitis Neurologic Complaints and Symptoms: No Complaints or Symptoms Medical History: Negative for: Dementia; Neuropathy; Quadriplegia; Paraplegia; Seizure Disorder Oncologic Complaints and Symptoms: No Complaints or Symptoms Medical History: Negative for: Received Chemotherapy; Received Radiation Psychiatric Complaints and Symptoms: No Complaints or Symptoms Medical History: Negative for: Anorexia/bulimia; Confinement Anxiety Immunizations Tetanus Vaccine: Last tetanus shot: 03/11/2009 Family and Social History Cancer: No; Diabetes: Yes - Siblings; Heart Disease: Yes - Siblings; Hypertension: No; Kidney Disease: No; Lung Disease: No; Stroke: No; Thyroid Problems: Yes - Mother; Never smoker; Marital Status - Married; Alcohol Use: Daily; Drug Use: No History; Caffeine Use: Daily; Financial Concerns: No; Living Will: No; Medical Power of Attorney: Yes (Not Provided) Physician Affirmation I have reviewed and agree with the above information. RATHANA, HERING ([id number]) Electronic Signature(s) Signed: 11/25/2015 9:37:12 AM By: Christin Fudge MD, FACS Signed: 11/25/2015 5:19:40 PM By: Regan Lemming  BSN, RN Entered By: Christin Fudge on 11/25/2015 09:37:11 [name] [name] ([id number]) -------------------------------------------------------------------------------- SuperBill Details Patient Name: [name] [name] Date of Service: 11/25/2015 Medical Record Number: [id number] Patient Account Number: [id number] Date of Birth/Sex: 1941/10/12 (74 y.o. 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 Facility Procedures CPT4: Description Modifier Quantity Code [id number] - WOUND CARE VISIT-LEV 4 EST PT 1 CPT4: [id number] - [facility]BR[facility] WOUND 1ST 20 SQ CM OR < 1 ICD-10 Description Diagnosis E11.621 Type 2 diabetes mellitus with foot ulcer 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 I89.0 Lymphedema, not  elsewhere classified 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 Physician Procedures CPT4: Description Modifier Quantity Code [id number] - WC PHYS LEVEL 4 - EST PT 25 1 ICD-10 Description Diagnosis E11.621 Type 2 diabetes mellitus with foot ulcer 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

## 2015-12-02 ENCOUNTER — Encounter: Payer: Medicare Other | Admitting: Surgery

## 2015-12-02 DIAGNOSIS — E11621 Type 2 diabetes mellitus with foot ulcer: Secondary | ICD-10-CM | POA: Diagnosis not present

## 2015-12-02 NOTE — Progress Notes (Addendum)
[name] [name], [name] [name] ([id number]) Visit Report for 12/02/2015 Chief Complaint Document Details Patient Name: [name] [name], [name] [name] Date of Service: 12/02/2015 8:00 AM Medical Record Number: [id number] Patient Account Number: [id number] Date of Birth/Sex: [birth year] (74 y.o. Male) Treating RN: Montey Hora Primary Care Physician: Thersa Salt Other Clinician: Referring Physician: Thersa Salt Treating Physician/Extender: Frann Rider in Treatment: 1 Information Obtained from: Patient Chief Complaint Patients presents for treatment of an open diabetic ulcer to the left heel on the posterior- lateral part of the heel. This is a recurrent swelling and ulceration for about 3 weeks now. Electronic Signature(s) Signed: 12/02/2015 8:29:34 AM By: Christin Fudge MD, FACS Entered By: Christin Fudge on 12/02/2015 08:29:34 [name] [name] ([id number]) -------------------------------------------------------------------------------- HPI Details Patient Name: [name] [name] Date of Service: 12/02/2015 8:00 AM Medical Record Number: [id number] Patient Account Number: [id number] Date of Birth/Sex: 27-May-1941 (74 y.o. Male) Treating RN: Montey Hora Primary Care Physician: Thersa Salt Other Clinician: Referring Physician: Thersa Salt Treating Physician/Extender: Frann Rider in Treatment: 1 History of Present Illness Location: left posterior lateral heel Quality: Patient reports No Pain. Severity: Patient states wound (s) are getting better. Duration: Patient has had the wound for < 4 weeks prior to presenting for treatment Context: The wound occurred when the patient had an abrasion while pulling on stockings Modifying Factors: Other treatment(s) tried include:compression with unknown as both Associated Signs and Symptoms: Patient reports having increase swelling of his left lower extremity.Marland Kitchen HPI Description: 74 year old gentleman who is known to me from a previous wound care consultation  and follow-up between 03/23/2015 and 09/07/2015. During that time he had a ulceration on the left lateral heel which was treated with conservative therapy including 5 applications of Epifix. The patient kept having a recurrence of this ulceration and went for a second opinion to [facility] and was seen there by Dr. Martyn Malay. 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 X-ray of the foot showed no erosions. He was treated with Unna boot and silver nitrate and healed completely on 10/19/2015. This is a recurrence after about 3 weeks now. This time around he was also referred to Dr. Hal Hope the vascular surgeon for possible endovenous ablation. I do not have this official report -- but as for the patient's verbal report-- the vascular surgeon did a complete workup and did not find any surgical intervention which was going to help him and suggested that this an atypical ulceration is possibly not due to venous incompetence. I have asked for this report. Past medical history -- known to be a diabetic on treatment for the last 30 years and takes insulin for this. He's been a nonsmoker all his life and hasn't had no problems with any arterial disease and has had the saphenous vein harvesting on both legs in the past. 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 He had 5 applications of a prefix starting 06/29/2015 -- 0000000 - final application of a Epifix. 12/02/2015 -- he was seen by his urologist for a possible left inguinal hernia but was found to have lymphadenitis on the left side. He was treated with antibiotics for  a few days and he seems to feel a little better as far as the swelling goes. No imaging was done at this stage. Electronic Signature(s) Signed: 12/02/2015 8:51:36 AM By: Christin Fudge MD, FACS Previous Signature: 12/02/2015 8:29:40 AM Version By: Christin Fudge MD, FACS Previous Signature: 12/02/2015 8:15:05 AM Version By: Christin Fudge MD, FACS [name] [name] ([id number]) Entered By: Christin Fudge on 12/02/2015 08:51:36 [name] [name] ([id number]) -------------------------------------------------------------------------------- Physical Exam Details Patient Name: [name] [name] Date of Service: 12/02/2015 8:00 AM Medical Record Number: [id number] Patient Account Number: [id number] Date of Birth/Sex: 29-Apr-1941 (74 y.o. Male) Treating RN: Montey Hora Primary Care Physician: Thersa Salt Other Clinician: Referring Physician: Thersa Salt Treating Physician/Extender: Frann Rider in Treatment: 1 Constitutional . Pulse regular. Respirations normal and unlabored. Afebrile. . Eyes Nonicteric. Reactive to light. Ears, Nose, Mouth, and Throat Lips, teeth, and gums WNL.Marland Kitchen Moist mucosa without lesions. Neck supple and nontender. No palpable supraclavicular or cervical adenopathy. Normal sized without goiter. Respiratory WNL. No retractions.. Breath sounds WNL, No rubs, rales, rhonchi, or wheeze.. Cardiovascular Heart rhythm and rate regular, no murmur or gallop.. Pedal Pulses WNL. No clubbing, cyanosis or edema. Chest Breasts symmetical and no nipple discharge.. Breast tissue WNL, no masses, lumps, or tenderness.. Genitourinary (GU) the left inguinal region has horizontal chain lymph nodes with some tenderness and he has lymphadenitis.Marland Kitchen Penis without lesions.Lowella Fairy without lesions. No cystocele, or rectocele. Pelvic support intact, no discharge.Marland Kitchen Urethra without masses, tenderness or scarring.Marland Kitchen Lymphatic No adneopathy. No adenopathy. No adenopathy. Musculoskeletal Adexa  without tenderness or enlargement.. Digits and nails w/o clubbing, cyanosis, infection, petechiae, ischemia, or inflammatory conditions.. Integumentary (Hair, Skin) No suspicious lesions. No crepitus or fluctuance. No peri-wound warmth or erythema. No masses.Marland Kitchen Psychiatric Judgement and insight Intact.. No evidence of depression, anxiety, or agitation.. 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 The ulceration of the left lateral heel looks a little better and there is no eschar or callus to be debrided. Electronic Signature(s) Signed: 12/02/2015 8:52:41 AM By: Christin Fudge MD, FACS Entered By: Christin Fudge on 12/02/2015 08:52:41 [name] [name] ([name] ([id number]) -------------------------------------------------------------------------------- Physician Orders Details Patient Name: [name] [name] Date of Service: 12/02/2015 8:00 AM Medical Record Number: [id number] Patient Account Number: [id number] Date of Birth/Sex: 02-12-1941 (74 y.o. 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 Anesthetic Wound #2 Left Calcaneous o Topical Lidocaine 4% cream applied to wound bed prior to debridement Primary Wound Dressing Wound #2 Left Calcaneous o Other: - Mepitel Lite Secondary Dressing Wound #2 Left Calcaneous o Gauze and  Kerlix/Conform o XtraSorb - as needed for drainage Dressing Change Frequency Wound #2 Left Calcaneous o Change dressing every other day. Follow-up Appointments Wound #2 Left Calcaneous o Return Appointment in 2 weeks. Edema Control Wound #2 Left Calcaneous GLYN, BARRAZA. ([id number]) o Patient to wear own Juxtalite/Juzo compression garment. Electronic Signature(s) Signed: 12/02/2015 5:05:23 PM By: Christin Fudge MD, FACS Signed: 12/02/2015 5:45:44 PM By: Marjory Lies  Di Kindle Entered By: Montey Hora on 12/02/2015 08:45:24 [name] [name] ([id number]) -------------------------------------------------------------------------------- Problem List Details Patient Name: [name] [name] Date of Service: 12/02/2015 8:00 AM Medical Record Number: [id number] Patient Account Number: [id number] Date of Birth/Sex: 12-18-1940 (74 y.o. 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 Male) Treating RN:  Montey Hora Primary Care Physician: Thersa Salt Other Clinician: Referring Physician: Thersa Salt Treating Physician/Extender: Frann Rider in Treatment: 1 Subjective Chief Complaint Information obtained from Patient Patients presents for treatment of an open diabetic ulcer to the left heel on the posterior- lateral part of the heel. This is a recurrent swelling and ulceration for about 3 weeks now. History of Present Illness (HPI) The following HPI elements were documented for the patient's wound: Location: left posterior lateral heel Quality: Patient reports No Pain. Severity: Patient states wound (s) are getting better. Duration: Patient has had the wound for < 4 weeks prior to presenting for treatment Context: The wound occurred when the patient had an abrasion while pulling on stockings Modifying Factors: Other treatment(s) tried include:compression with unknown as both Associated Signs and Symptoms: Patient reports having increase swelling of his left lower extremity.. 74 year old gentleman who is known to me from a previous wound care consultation and follow-up between 03/23/2015 and 09/07/2015. During that time he had a ulceration on the left lateral heel which was treated with conservative therapy including 5 applications of Epifix. The patient kept having a recurrence of this ulceration and went for a second opinion to [facility] and was seen there by Dr. Martyn Malay. 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 X-ray of the foot showed no erosions. He was treated with Unna boot and silver nitrate and healed completely on 10/19/2015. This is a recurrence after about 3 weeks now. This time around he was also referred to Dr. Hal Hope the vascular surgeon for possible  endovenous ablation. I do not have this official report -- but as for the patient's verbal report-- the vascular surgeon did a complete workup and did not find any surgical intervention which was going to help him and suggested that this an atypical ulceration is possibly not due to venous incompetence. I have asked for this report. Past medical history -- known to be a diabetic on treatment for the last 30 years and takes insulin for this. He's been a nonsmoker all his life and hasn't had no problems with any arterial disease and has had the saphenous vein harvesting on both legs in the past. 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 He had 5 applications of a prefix starting 06/29/2015 -- 0000000 - final application of a Epifix. 12/02/2015 -- he was seen by his urologist for a possible left inguinal hernia but was found to have [name] [name], [name] [name]. ([id number]) lymphadenitis on the left side. He was treated with antibiotics for a few days and he seems to feel a little better as far as the swelling goes. No imaging was done at this stage. Objective Constitutional Pulse regular. Respirations normal and unlabored. Afebrile. Vitals Time Taken: 8:17 AM, Height: 71 in, Weight: 176 lbs, BMI: 24.5, Temperature: 98.1 F, Pulse: 65 bpm, Respiratory Rate: 16 breaths/min, Blood Pressure: 126/54 mmHg. Eyes Nonicteric. Reactive to light. Ears, Nose, Mouth, and Throat Lips, teeth, and gums WNL.Marland Kitchen Moist mucosa without lesions. Neck supple and nontender. No palpable supraclavicular or cervical adenopathy. Normal sized without goiter. Respiratory WNL. No retractions.. Breath sounds WNL, No rubs, rales, rhonchi, or wheeze.. Cardiovascular Heart rhythm and rate regular, no murmur or gallop.. Pedal Pulses WNL. No clubbing,  cyanosis or edema. Chest Breasts symmetical and no nipple discharge.. Breast tissue WNL, no masses, lumps, or tenderness.. Genitourinary (GU) the left inguinal region has horizontal chain lymph nodes with some tenderness and he has lymphadenitis.Marland Kitchen Penis without lesions.Lowella Fairy without lesions. No cystocele, or rectocele. Pelvic support intact, no discharge.Marland Kitchen Urethra without masses, tenderness or scarring.Marland Kitchen Lymphatic No adneopathy. No adenopathy. No adenopathy. Musculoskeletal Adexa without tenderness or enlargement.. Digits and nails w/o clubbing, cyanosis, infection, petechiae, ischemia, or inflammatory conditions.Marland Kitchen Psychiatric Judgement and insight Intact.. 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 The ulceration of the left lateral heel looks a little better and there is no eschar or callus to be debrided. Integumentary (Hair, Skin) No suspicious lesions. No crepitus or fluctuance. No peri-wound warmth or erythema. No masses.. Wound #2 status is Open. Original cause of wound was Pressure Injury. The wound is located on the Left Calcaneous. The wound measures 0.5cm length x 1.2cm width x 0.1cm depth; 0.471cm^2 area and 0.047cm^3 volume. The wound is limited to skin breakdown. There is no tunneling or undermining noted. There is a large amount of serous drainage noted. The wound margin is distinct with the outline attached to the wound base. There is large (67-100%) red granulation within the wound bed. There is no necrotic tissue within the wound bed. The periwound skin appearance exhibited: Moist. The periwound skin appearance did not exhibit: Callus, Crepitus, Excoriation, Fluctuance, Friable, Induration, Localized Edema, Rash, Scarring, Dry/Scaly, Maceration, Atrophie Blanche, Cyanosis, Ecchymosis, Hemosiderin Staining, Mottled, Pallor, Rubor, Erythema.  Periwound temperature was noted as No Abnormality. 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 He has lymphadenitis which has been treated with oral antibiotics and I have recommended ibuprofen 600 mg 3 times a day after meals for a week. Due to the fact that he's had this ulceration and edema for over 9 months  now in spite of compression, exercise and elevation he continues to have problems and at this stage may benefit from lymphedema pumps to be used twice a day in addition to his conservator therapy. I have recommended lymphedema pumps to be used and we will put in the request with the vendor and see if he can get these organized for him and start using them as soon as possible. The patient will see [place] back in 2 weeks due to the holiday. [name] [name], [name] [name] ([id number]) Plan Wound Cleansing: Wound #2 Left Calcaneous: Clean wound with Normal Saline. Anesthetic: Wound #2 Left Calcaneous: Topical Lidocaine 4% cream applied to wound bed prior to debridement Primary Wound Dressing: Wound #2 Left Calcaneous: Other: - Mepitel Lite Secondary Dressing: Wound #2 Left Calcaneous: Gauze and Kerlix/Conform XtraSorb - as needed for drainage Dressing Change Frequency: Wound #2 Left Calcaneous: Change dressing every other day. Follow-up Appointments: Wound #2 Left Calcaneous: Return Appointment in 2 weeks. Edema Control: Wound #2 Left Calcaneous: Patient to wear own Juxtalite/Juzo compression garment. At present I have recommended a Mepitel light dressing over the ulceration and he will uses juxta  light compression for his left lower extremity. He has lymphadenitis which has been treated with oral antibiotics and I have recommended ibuprofen 600 mg 3 times a day after meals for a week. Due to the fact that he's had this ulceration and edema for over 9 months now in spite of compression, exercise and elevation he continues to have problems and at this stage may benefit from lymphedema pumps to be used twice a day in addition to his conservator therapy. I have recommended lymphedema pumps to be used and we will put in the request with the vendor and see if he can get these organized for him and start using them as soon as possible. The patient will see [place] back in 2 weeks due to the holiday. [name] [name], [name] [name] ([id number]) Electronic Signature(s) Signed: 12/02/2015 8:55:05 AM By: Christin Fudge MD, FACS Entered By: Christin Fudge on 12/02/2015 08:55:05 [name] [name] ([id number]) -------------------------------------------------------------------------------- SuperBill Details Patient Name: [name] [name] Date of Service: 12/02/2015 Medical Record Number: [id number] Patient Account Number: [id number] Date of Birth/Sex: 1941-08-19 (74 y.o. 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 Facility Procedures CPT4 Code: [id number] Description: 99213 - WOUND CARE VISIT-LEV 3 EST PT Modifier: Quantity: 1 Physician Procedures CPT4: Description Modifier Quantity Code [id number] 99213 - WC PHYS LEVEL 3 - EST PT 1 ICD-10 Description Diagnosis E11.621 Type 2  diabetes mellitus with foot ulcer 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

## 2015-12-03 NOTE — Progress Notes (Signed)
[name], [name] ([id number]) Visit Report for 12/02/2015 Arrival Information Details Patient Name: [name] [name], [name] [name] Date of Service: 12/02/2015 8:00 AM Medical Record Number: [id number] Patient Account Number: [id number] Date of Birth/Sex: 1941-05-26 (74 y.o. Male) Treating RN: Montey Hora Primary Care Physician: Thersa Salt Other Clinician: Referring Physician: Thersa Salt Treating Physician/Extender: Frann Rider in Treatment: 1 Visit Information History Since Last Visit Added or deleted any medications: No Patient Arrived: Ambulatory Any new allergies or adverse reactions: No Arrival Time: 08:04 Had a fall or experienced change in No Accompanied By: self activities of daily living that may affect Transfer Assistance: None risk of falls: Patient Identification Verified: Yes Signs or symptoms of abuse/neglect since last No Secondary Verification Process Yes visito Completed: Hospitalized since last visit: No Patient Requires Transmission-Based No Pain Present Now: No Precautions: Patient Has Alerts: No Electronic Signature(s) Signed: 12/02/2015 5:45:44 PM By: Montey Hora Entered By: Montey Hora on 12/02/2015 08:04:51 [name] [name] ([id number]) -------------------------------------------------------------------------------- Clinic Level of Care Assessment Details Patient Name: [name] [name] Date of Service: 12/02/2015 8:00 AM Medical Record Number: [id number] Patient Account Number: [id number] Date of Birth/Sex: [birth year] (74 y.o. Male) Treating RN: Montey Hora Primary Care Physician: Thersa Salt Other Clinician: Referring Physician: Thersa Salt Treating Physician/Extender: Frann Rider in Treatment: 1 Clinic Level of Care Assessment Items TOOL 4 Quantity Score []  - Use when only an EandM is performed on FOLLOW-UP visit 0 ASSESSMENTS - Nursing Assessment / Reassessment X - Reassessment of Co-morbidities (includes updates in patient status)  1 10 X - Reassessment of Adherence to Treatment Plan 1 5 ASSESSMENTS - Wound and Skin Assessment / Reassessment X - Simple Wound Assessment / Reassessment - one wound 1 5 []  - Complex Wound Assessment / Reassessment - multiple wounds 0 []  - Dermatologic / Skin Assessment (not related to wound area) 0 ASSESSMENTS - Focused Assessment []  - Circumferential Edema Measurements - multi extremities 0 []  - Nutritional Assessment / Counseling / Intervention 0 X - Lower Extremity Assessment (monofilament, tuning fork, pulses) 1 5 []  - Peripheral Arterial Disease Assessment (using hand held doppler) 0 ASSESSMENTS - Ostomy and/or Continence Assessment and Care []  - Incontinence Assessment and Management 0 []  - Ostomy Care Assessment and Management (repouching, etc.) 0 PROCESS - Coordination of Care X - Simple Patient / Family Education for ongoing care 1 15 []  - Complex (extensive) Patient / Family Education for ongoing care 0 []  - Staff obtains Programmer, systems, Records, Test Results / Process Orders 0 []  - Staff telephones HHA, Nursing Homes / Clarify orders / etc 0 []  - Routine Transfer to another Facility (non-emergent condition) 0 AAYAN, SCHROEPFER. ([id number]) []  - Routine Hospital Admission (non-emergent condition) 0 []  - New Admissions / Biomedical engineer / Ordering NPWT, Apligraf, etc. 0 []  - Emergency Hospital Admission (emergent condition) 0 X - Simple Discharge Coordination 1 10 []  - Complex (extensive) Discharge Coordination 0 PROCESS - Special Needs []  - Pediatric / Minor Patient Management 0 []  - Isolation Patient Management 0 []  - Hearing / Language / Visual special needs 0 []  - Assessment of Community assistance (transportation, D/C planning, etc.) 0 []  - Additional assistance / Altered mentation 0 []  - Support Surface(s) Assessment (bed, cushion, seat, etc.) 0 INTERVENTIONS - Wound Cleansing / Measurement X - Simple Wound Cleansing - one wound 1 5 []  - Complex Wound Cleansing -  multiple wounds 0 X - Wound Imaging (photographs - any number of wounds) 1 5 []  - Wound Tracing (instead of photographs) 0 X -  Simple Wound Measurement - one wound 1 5 []  - Complex Wound Measurement - multiple wounds 0 INTERVENTIONS - Wound Dressings X - Small Wound Dressing one or multiple wounds 1 10 []  - Medium Wound Dressing one or multiple wounds 0 []  - Large Wound Dressing one or multiple wounds 0 []  - Application of Medications - topical 0 []  - Application of Medications - injection 0 INTERVENTIONS - Miscellaneous []  - External ear exam 0 BRONNER, MACGILL. ([id number]) []  - Specimen Collection (cultures, biopsies, blood, body fluids, etc.) 0 []  - Specimen(s) / Culture(s) sent or taken to Lab for analysis 0 []  - Patient Transfer (multiple staff / Harrel Lemon Lift / Similar devices) 0 []  - Simple Staple / Suture removal (25 or less) 0 []  - Complex Staple / Suture removal (26 or more) 0 []  - Hypo / Hyperglycemic Management (close monitor of Blood Glucose) 0 []  - Ankle / Brachial Index (ABI) - do not check if billed separately 0 X - Vital Signs 1 5 Has the patient been seen at the hospital within the last three years: Yes Total Score: 80 Level Of Care: New/Established - Level 3 Electronic Signature(s) Signed: 12/02/2015 5:45:44 PM By: Montey Hora Entered By: Montey Hora on 12/02/2015 08:46:02 [name] [name] ([id number]) -------------------------------------------------------------------------------- Encounter Discharge Information Details Patient Name: [name] [name] Date of Service: 12/02/2015 8:00 AM Medical Record Number: [id number] Patient Account Number: [id number] Date of Birth/Sex: 04-25-1941 (74 y.o. Male) Treating RN: Montey Hora Primary Care Physician: Thersa Salt Other Clinician: Referring Physician: Thersa Salt Treating Physician/Extender: Frann Rider in Treatment: 1 Encounter Discharge Information Items Discharge Pain Level: 0 Discharge Condition:  Stable Ambulatory Status: Ambulatory Discharge Destination: Home Transportation: Private Auto Accompanied By: self Schedule Follow-up Appointment: Yes Medication Reconciliation completed and provided to Patient/Care No Unita Detamore: Provided on Clinical Summary of Care: 12/02/2015 Form Type Recipient Paper Patient RO Electronic Signature(s) Signed: 12/02/2015 12:02:00 PM By: Montey Hora Previous Signature: 12/02/2015 9:13:49 AM Version By: Ruthine Dose Entered By: Montey Hora on 12/02/2015 12:01:59 [name] [name] ([id number]) -------------------------------------------------------------------------------- Lower Extremity Assessment Details Patient Name: [name] [name] Date of Service: 12/02/2015 8:00 AM Medical Record Number: [id number] Patient Account Number: [id number] Date of Birth/Sex: [birth year] (74 y.o. Male) Treating RN: Montey Hora Primary Care Physician: Thersa Salt Other Clinician: Referring Physician: Thersa Salt Treating Physician/Extender: Frann Rider in Treatment: 1 Edema Assessment Assessed: [Left: No] [Right: No] Edema: [Left: Ye] [Right: s] Vascular Assessment Pulses: Posterior Tibial Dorsalis Pedis Palpable: [Left:Yes] Extremity colors, hair growth, and conditions: Extremity Color: [Left:Mottled] Hair Growth on Extremity: [Left:Yes] Temperature of Extremity: [Left:Warm] Capillary Refill: [Left:< 3 seconds] Toe Nail Assessment Left: Right: Thick: No Discolored: No Deformed: No Improper Length and Hygiene: No Electronic Signature(s) Signed: 12/02/2015 5:45:44 PM By: Montey Hora Entered By: Montey Hora on 12/02/2015 08:14:58 [name] [name] ([id number]) -------------------------------------------------------------------------------- Multi Wound Chart Details Patient Name: [name] [name] Date of Service: 12/02/2015 8:00 AM Medical Record Number: [id number] Patient Account Number: [id number] Date of Birth/Sex: [birth year] (74  y.o. Male) Treating RN: Montey Hora Primary Care Physician: Thersa Salt Other Clinician: Referring Physician: Thersa Salt Treating Physician/Extender: Frann Rider in Treatment: 1 Vital Signs Height(in): 71 Pulse(bpm): 65 Weight(lbs): 176 Blood Pressure 126/54 (mmHg): Body Mass Index(BMI): 25 Temperature(F): Respiratory Rate 16 (breaths/min): Photos: [2:No Photos] [N/A:N/A] Wound Location: [2:Left Calcaneous] [N/A:N/A] Wounding Event: [2:Pressure Injury] [N/A:N/A] Primary Etiology: [2:Diabetic Wound/Ulcer of the Lower Extremity] [N/A:N/A] Comorbid History: [2:Type II Diabetes] [N/A:N/A] Date Acquired: [2:03/23/2015] [N/A:N/A] Weeks of Treatment: [2:1] [N/A:N/A] Wound Status: [  2:Open] [N/A:N/A] Measurements L x W x D 0.5x1.2x0.1 [N/A:N/A] (cm) Area (cm) : [2:0.471] [N/A:N/A] Volume (cm) : [2:0.047] [N/A:N/A] % Reduction in Area: [2:-19.80%] [N/A:N/A] % Reduction in Volume: -20.50% [N/A:N/A] Classification: [2:Grade 0] [N/A:N/A] Exudate Amount: [2:Small] [N/A:N/A] Exudate Type: [2:Serous] [N/A:N/A] Exudate Color: [2:amber] [N/A:N/A] Wound Margin: [2:Distinct, outline attached] [N/A:N/A] Granulation Amount: [2:Large (67-100%)] [N/A:N/A] Granulation Quality: [2:Red] [N/A:N/A] Necrotic Amount: [2:None Present (0%)] [N/A:N/A] Exposed Structures: [2:Fascia: No Fat: No Tendon: No Muscle: No Joint: No Bone: No] [N/A:N/A] Limited to Skin Breakdown Epithelialization: None N/A N/A Periwound Skin Texture: Edema: No N/A N/A Excoriation: No Induration: No Callus: No Crepitus: No Fluctuance: No Friable: No Rash: No Scarring: No Periwound Skin Moist: Yes N/A N/A Moisture: Maceration: No Dry/Scaly: No Periwound Skin Color: Atrophie Blanche: No N/A N/A Cyanosis: No Ecchymosis: No Erythema: No Hemosiderin Staining: No Mottled: No Pallor: No Rubor: No Temperature: No Abnormality N/A N/A Tenderness on No N/A N/A Palpation: Wound Preparation: Ulcer  Cleansing: Other: N/A N/A soap and water Topical Anesthetic Applied: Other: LIDOCAINE 4% Treatment Notes Electronic Signature(s) Signed: 12/02/2015 5:45:44 PM By: Montey Hora Entered By: Montey Hora on 12/02/2015 08:21:19 [name] [name] ([id number]) -------------------------------------------------------------------------------- [place] Details Patient Name: [name] [name] Date of Service: 12/02/2015 8:00 AM Medical Record Number: [id number] Patient Account Number: [id number] Date of Birth/Sex: 03/23/1941 (74 y.o. Male) Treating RN: Montey Hora Primary Care Physician: Thersa Salt Other Clinician: Referring Physician: Thersa Salt Treating Physician/Extender: Frann Rider in Treatment: 1 Active Inactive Orientation to the Wound Care Program Nursing Diagnoses: Knowledge deficit related to the wound healing center program Goals: Patient/caregiver will verbalize understanding of the [facility] Date Initiated: 11/25/2015 Goal Status: Active Interventions: Provide education on orientation to the wound center Notes: Wound/Skin Impairment Nursing Diagnoses: Impaired tissue integrity Knowledge deficit related to ulceration/compromised skin integrity Goals: Patient/caregiver will verbalize understanding of skin care regimen Date Initiated: 11/25/2015 Goal Status: Active Ulcer/skin breakdown will have a volume reduction of 30% by week 4 Date Initiated: 11/25/2015 Goal Status: Active Ulcer/skin breakdown will have a volume reduction of 50% by week 8 Date Initiated: 11/25/2015 Goal Status: Active Ulcer/skin breakdown will have a volume reduction of 80% by week 12 Date Initiated: 11/25/2015 Goal Status: Active Ulcer/skin breakdown will heal within 14 weeks Date Initiated: 11/25/2015 [name] [name] ([id number]) Goal Status: Active Interventions: Assess patient/caregiver ability to perform ulcer/skin care regimen upon  admission and as needed Assess ulceration(s) every visit Provide education on ulcer and skin care Notes: Electronic Signature(s) Signed: 12/02/2015 5:45:44 PM By: Montey Hora Entered By: Montey Hora on 12/02/2015 08:21:09 [name] [name] ([id number]) -------------------------------------------------------------------------------- Patient/Caregiver Education Details Patient Name: [name] [name] Date of Service: 12/02/2015 8:00 AM Medical Record Number: [id number] Patient Account Number: [id number] Date of Birth/Gender: [birth year] (74 y.o. Male) Treating RN: Montey Hora Primary Care Physician: Thersa Salt Other Clinician: Referring Physician: Thersa Salt Treating Physician/Extender: Frann Rider in Treatment: 1 Education Assessment Education Provided To: Patient Education Topics Provided Venous: Handouts: Other: juxtalite compression Methods: Demonstration, Explain/Verbal, Printed Responses: State content correctly Electronic Signature(s) Signed: 12/02/2015 12:02:30 PM By: Montey Hora Entered By: Montey Hora on 12/02/2015 12:02:29 [name] [name] ([id number]) -------------------------------------------------------------------------------- Wound Assessment Details Patient Name: [name] [name] Date of Service: 12/02/2015 8:00 AM Medical Record Number: [id number] Patient Account Number: [id number] Date of Birth/Sex: [birth year] (74 y.o. Male) Treating RN: Montey Hora Primary Care Physician: Thersa Salt Other Clinician: Referring Physician: Thersa Salt Treating Physician/Extender: Frann Rider in Treatment: 1 Wound Status Wound Number: 2  Primary Diabetic Wound/Ulcer of the Lower Etiology: Extremity Wound Location: Left Calcaneous Wound Status: Open Wounding Event: Pressure Injury Comorbid Type II Diabetes Date Acquired: 03/23/2015 History: Weeks Of Treatment: 1 Clustered Wound: No Photos Photo Uploaded By: Montey Hora on 12/02/2015  17:22:50 Wound Measurements Length: (cm) 0.5 Width: (cm) 1.2 Depth: (cm) 0.1 Area: (cm) 0.471 Volume: (cm) 0.047 % Reduction in Area: -19.8% % Reduction in Volume: -20.5% Epithelialization: None Tunneling: No Undermining: No Wound Description Classification: Grade 0 Wound Margin: Distinct, outline attached Exudate Amount: Large Exudate Type: Serous Exudate Color: amber Foul Odor After Cleansing: No Wound Bed Granulation Amount: Large (67-100%) Exposed Structure Granulation Quality: Red Fascia Exposed: No Necrotic Amount: None Present (0%) Fat Layer Exposed: No Tendon Exposed: No SHAFIN, EDGAR. ([id number]) Muscle Exposed: No Joint Exposed: No Bone Exposed: No Limited to Skin Breakdown Periwound Skin Texture Texture Color No Abnormalities Noted: No No Abnormalities Noted: No Callus: No Atrophie Blanche: No Crepitus: No Cyanosis: No Excoriation: No Ecchymosis: No Fluctuance: No Erythema: No Friable: No Hemosiderin Staining: No Induration: No Mottled: No Localized Edema: No Pallor: No Rash: No Rubor: No Scarring: No Temperature / Pain Moisture Temperature: No Abnormality No Abnormalities Noted: No Dry / Scaly: No Maceration: No Moist: Yes Wound Preparation Ulcer Cleansing: Other: soap and water, Topical Anesthetic Applied: Other: LIDOCAINE 4%, Treatment Notes Wound #2 (Left Calcaneous) 1. Cleansed with: Cleanse wound with antibacterial soap and water 2. Anesthetic Topical Lidocaine 4% cream to wound bed prior to debridement 3. Peri-wound Care: Skin Prep 4. Dressing Applied: Other dressing (specify in notes) 5. Secondary Dressing Applied Kerlix/Conform 7. Secured with Tape Notes Public librarian) Signed: 12/02/2015 5:45:44 PM By: Waynetta Pean ([id number]) Entered By: Montey Hora on 12/02/2015 08:48:33 [name] [name]  ([id number]) -------------------------------------------------------------------------------- Vitals Details Patient Name: [name] [name] Date of Service: 12/02/2015 8:00 AM Medical Record Number: [id number] Patient Account Number: [id number] Date of Birth/Sex: September 19, 1941 (74 y.o. Male) Treating RN: Montey Hora Primary Care Physician: Thersa Salt Other Clinician: Referring Physician: Thersa Salt Treating Physician/Extender: Frann Rider in Treatment: 1 Vital Signs Time Taken: 08:17 Temperature (F): 98.1 Height (in): 71 Pulse (bpm): 65 Weight (lbs): 176 Respiratory Rate (breaths/min): 16 Body Mass Index (BMI): 24.5 Blood Pressure (mmHg): 126/54 Reference Range: 80 - 120 mg / dl Electronic Signature(s) Signed: 12/02/2015 5:45:44 PM By: Montey Hora Entered By: Montey Hora on 12/02/2015 08:36:39

## 2015-12-16 ENCOUNTER — Encounter: Payer: Medicare Other | Attending: Surgery | Admitting: Surgery

## 2015-12-16 DIAGNOSIS — I890 Lymphedema, not elsewhere classified: Secondary | ICD-10-CM | POA: Diagnosis not present

## 2015-12-16 DIAGNOSIS — I83025 Varicose veins of left lower extremity with ulcer other part of foot: Secondary | ICD-10-CM | POA: Diagnosis not present

## 2015-12-16 DIAGNOSIS — E11621 Type 2 diabetes mellitus with foot ulcer: Secondary | ICD-10-CM | POA: Insufficient documentation

## 2015-12-16 DIAGNOSIS — L97429 Non-pressure chronic ulcer of left heel and midfoot with unspecified severity: Secondary | ICD-10-CM | POA: Insufficient documentation

## 2015-12-17 NOTE — Progress Notes (Signed)
[name] [name] ([id number]) Visit Report for 12/16/2015 Arrival Information Details Patient Name: [name] [name], [name] [name] Date of Service: 12/16/2015 8:00 AM Medical Record Number: [id number] Patient Account Number: [id number] Date of Birth/Sex: 1941-06-28 (75 y.o. Male) Treating RN: Baruch Gouty, RN, BSN, Velva Harman Primary Care Physician: Thersa Salt Other Clinician: Referring Physician: Thersa Salt Treating Physician/Extender: Frann Rider in Treatment: 3 Visit Information History Since Last Visit Added or deleted any medications: No Patient Arrived: Ambulatory Any new allergies or adverse reactions: No Arrival Time: 08:09 Had a fall or experienced change in No Accompanied By: self activities of daily living that may affect Transfer Assistance: None risk of falls: Patient Identification Verified: Yes Signs or symptoms of abuse/neglect since last No Secondary Verification Process Yes visito Completed: Has Dressing in Place as Prescribed: Yes Patient Requires Transmission-Based No Has Compression in Place as Prescribed: Yes Precautions: Pain Present Now: No Patient Has Alerts: No Electronic Signature(s) Signed: 12/16/2015 3:35:50 PM By: Regan Lemming BSN, RN Entered By: Regan Lemming on 12/16/2015 08:10:13 [name] [name] ([id number]) -------------------------------------------------------------------------------- Encounter Discharge Information Details Patient Name: [name] [name] Date of Service: 12/16/2015 8:00 AM Medical Record Number: [id number] Patient Account Number: [id number] Date of Birth/Sex: Mar 08, 1941 (75 y.o. Male) Treating RN: Baruch Gouty, RN, BSN, Velva Harman Primary Care Physician: Thersa Salt Other Clinician: Referring Physician: Thersa Salt Treating Physician/Extender: Frann Rider in Treatment: 3 Encounter Discharge Information Items Discharge Pain Level: 0 Discharge Condition: Stable Ambulatory Status: Ambulatory Discharge Destination: Home Transportation: Private  Auto Accompanied By: self Schedule Follow-up Appointment: No Medication Reconciliation completed and provided to Patient/Care No Adriaan Maltese: Provided on Clinical Summary of Care: 12/16/2015 Form Type Recipient Paper Patient RO Electronic Signature(s) Signed: 12/16/2015 8:48:18 AM By: Ruthine Dose Entered By: Ruthine Dose on 12/16/2015 08:48:18 [name] [name] ([id number]) -------------------------------------------------------------------------------- Lower Extremity Assessment Details Patient Name: [name] [name] Date of Service: 12/16/2015 8:00 AM Medical Record Number: [id number] Patient Account Number: [id number] Date of Birth/Sex: 13-Jan-1941 (75 y.o. Male) Treating RN: Baruch Gouty, RN, BSN, Velva Harman Primary Care Physician: Thersa Salt Other Clinician: Referring Physician: Thersa Salt Treating Physician/Extender: Frann Rider in Treatment: 3 Vascular Assessment Pulses: Posterior Tibial Dorsalis Pedis Palpable: [Left:Yes] Extremity colors, hair growth, and conditions: Extremity Color: [Left:Normal] Hair Growth on Extremity: [Left:Yes] Temperature of Extremity: [Left:Warm] Capillary Refill: [Left:< 3 seconds] Toe Nail Assessment Left: Right: Thick: No Discolored: No Deformed: No Improper Length and Hygiene: No Electronic Signature(s) Signed: 12/16/2015 3:35:50 PM By: Regan Lemming BSN, RN Entered By: Regan Lemming on 12/16/2015 08:14:24 [name] [name] ([id number]) -------------------------------------------------------------------------------- Multi Wound Chart Details Patient Name: [name] [name] Date of Service: 12/16/2015 8:00 AM Medical Record Number: [id number] Patient Account Number: [id number] Date of Birth/Sex: August 05, 1941 (75 y.o. Male) Treating RN: Baruch Gouty, RN, BSN, [place] Primary Care Physician: Thersa Salt Other Clinician: Referring Physician: Thersa Salt Treating Physician/Extender: Frann Rider in Treatment: 3 Vital Signs Height(in): 71 Pulse(bpm):  70 Weight(lbs): 176 Blood Pressure 134/63 (mmHg): Body Mass Index(BMI): 25 Temperature(F): 98.3 Respiratory Rate 16 (breaths/min): Photos: [2:No Photos] [N/A:N/A] Wound Location: [2:Left Calcaneous] [N/A:N/A] Wounding Event: [2:Pressure Injury] [N/A:N/A] Primary Etiology: [2:Diabetic Wound/Ulcer of the Lower Extremity] [N/A:N/A] Comorbid History: [2:Type II Diabetes] [N/A:N/A] Date Acquired: [2:03/23/2015] [N/A:N/A] Weeks of Treatment: [2:3] [N/A:N/A] Wound Status: [2:Open] [N/A:N/A] Measurements L x W x D 0.5x0.3x0.1 [N/A:N/A] (cm) Area (cm) : [2:0.118] [N/A:N/A] Volume (cm) : [2:0.012] [N/A:N/A] % Reduction in Area: [2:70.00%] [N/A:N/A] % Reduction in Volume: 69.20% [N/A:N/A] Classification: [2:Grade 0] [N/A:N/A] Exudate Amount: [2:Large] [N/A:N/A] Exudate Type: [2:Serous] [N/A:N/A] Exudate Color: [2:amber] [N/A:N/A] Wound Margin: [  2:Distinct, outline attached] [N/A:N/A] Granulation Amount: [2:Large (67-100%)] [N/A:N/A] Granulation Quality: [2:Red] [N/A:N/A] Necrotic Amount: [2:None Present (0%)] [N/A:N/A] Exposed Structures: [2:Fascia: No Fat: No Tendon: No Muscle: No Joint: No Bone: No] [N/A:N/A] Limited to Skin Breakdown Epithelialization: Large (67-100%) N/A N/A Periwound Skin Texture: Edema: No N/A N/A Excoriation: No Induration: No Callus: No Crepitus: No Fluctuance: No Friable: No Rash: No Scarring: No Periwound Skin Moist: Yes N/A N/A Moisture: Maceration: No Dry/Scaly: No Periwound Skin Color: Atrophie Blanche: No N/A N/A Cyanosis: No Ecchymosis: No Erythema: No Hemosiderin Staining: No Mottled: No Pallor: No Rubor: No Temperature: No Abnormality N/A N/A Tenderness on No N/A N/A Palpation: Wound Preparation: Ulcer Cleansing: Other: N/A N/A soap and water Topical Anesthetic Applied: Other: LIDOCAINE 4% Treatment Notes Electronic Signature(s) Signed: 12/16/2015 3:35:50 PM By: Regan Lemming BSN, RN Entered By: Regan Lemming on 12/16/2015  08:24:33 [name] [name] ([id number]) -------------------------------------------------------------------------------- [place] Details Patient Name: [name] [name] Date of Service: 12/16/2015 8:00 AM Medical Record Number: [id number] Patient Account Number: [id number] Date of Birth/Sex: Jul 03, 1941 (75 y.o. Male) Treating RN: Baruch Gouty, RN, BSN, Velva Harman Primary Care Physician: Thersa Salt Other Clinician: Referring Physician: Thersa Salt Treating Physician/Extender: Frann Rider in Treatment: 3 Active Inactive Orientation to the Wound Care Program Nursing Diagnoses: Knowledge deficit related to the wound healing center program Goals: Patient/caregiver will verbalize understanding of the [facility] Date Initiated: 11/25/2015 Goal Status: Active Interventions: Provide education on orientation to the wound center Notes: Wound/Skin Impairment Nursing Diagnoses: Impaired tissue integrity Knowledge deficit related to ulceration/compromised skin integrity Goals: Patient/caregiver will verbalize understanding of skin care regimen Date Initiated: 11/25/2015 Goal Status: Active Ulcer/skin breakdown will have a volume reduction of 30% by week 4 Date Initiated: 11/25/2015 Goal Status: Active Ulcer/skin breakdown will have a volume reduction of 50% by week 8 Date Initiated: 11/25/2015 Goal Status: Active Ulcer/skin breakdown will have a volume reduction of 80% by week 12 Date Initiated: 11/25/2015 Goal Status: Active Ulcer/skin breakdown will heal within 14 weeks Date Initiated: 11/25/2015 [name] [name] ([id number]) Goal Status: Active Interventions: Assess patient/caregiver ability to perform ulcer/skin care regimen upon admission and as needed Assess ulceration(s) every visit Provide education on ulcer and skin care Notes: Electronic Signature(s) Signed: 12/16/2015 3:35:50 PM By: Regan Lemming BSN, RN Entered By: Regan Lemming on 12/16/2015  08:24:15 [name] [name] ([id number]) -------------------------------------------------------------------------------- Pain Assessment Details Patient Name: [name] [name] Date of Service: 12/16/2015 8:00 AM Medical Record Number: [id number] Patient Account Number: [id number] Date of Birth/Sex: 08/28/1941 (74 y.o. Male) Treating RN: Baruch Gouty, RN, BSN, [place] Primary Care Physician: Thersa Salt Other Clinician: Referring Physician: Thersa Salt Treating Physician/Extender: Frann Rider in Treatment: 3 Active Problems Location of Pain Severity and Description of Pain Patient Has Paino No Site Locations Pain Management and Medication Current Pain Management: Electronic Signature(s) Signed: 12/16/2015 3:35:50 PM By: Regan Lemming BSN, RN Entered By: Regan Lemming on 12/16/2015 08:14:08 [name] [name] ([id number]) -------------------------------------------------------------------------------- Patient/Caregiver Education Details Patient Name: [name] [name] Date of Service: 12/16/2015 8:00 AM Medical Record Number: [id number] Patient Account Number: [id number] Date of Birth/Gender: 1941/11/21 (75 y.o. Male) Treating RN: Baruch Gouty, RN, BSN, Velva Harman Primary Care Physician: Thersa Salt Other Clinician: Referring Physician: Thersa Salt Treating Physician/Extender: Frann Rider in Treatment: 3 Education Assessment Education Provided To: Patient Education Topics Provided Welcome To [facility]: Methods: Explain/Verbal Wound/Skin Impairment: Methods: Explain/Verbal Electronic Signature(s) Signed: 12/16/2015 3:35:50 PM By: Regan Lemming BSN, RN Entered By: Regan Lemming on 12/16/2015 08:47:33 Bashor, Hamzah W. (  [id number]) -------------------------------------------------------------------------------- Wound Assessment Details Patient Name: MARIANNE, OW Date of Service: 12/16/2015 8:00 AM Medical Record Number: [id number] Patient Account Number: [id number] Date of Birth/Sex: 1941-08-24  (75 y.o. Male) Treating RN: Baruch Gouty, RN, BSN, [place] Primary Care Physician: Thersa Salt Other Clinician: Referring Physician: Thersa Salt Treating Physician/Extender: Frann Rider in Treatment: 3 Wound Status Wound Number: 2 Primary Diabetic Wound/Ulcer of the Lower Etiology: Extremity Wound Location: Left Calcaneous Wound Status: Open Wounding Event: Pressure Injury Comorbid Type II Diabetes Date Acquired: 03/23/2015 History: Weeks Of Treatment: 3 Clustered Wound: No Photos Photo Uploaded By: Regan Lemming on 12/16/2015 15:33:16 Wound Measurements Length: (cm) 0.5 Width: (cm) 0.3 Depth: (cm) 0.1 Area: (cm) 0.118 Volume: (cm) 0.012 % Reduction in Area: 70% % Reduction in Volume: 69.2% Epithelialization: Large (67-100%) Tunneling: No Undermining: No Wound Description Classification: Grade 0 Wound Margin: Distinct, outline attached Exudate Amount: Large Exudate Type: Serous Exudate Color: amber Foul Odor After Cleansing: No Wound Bed Granulation Amount: Large (67-100%) Exposed Structure Granulation Quality: Red Fascia Exposed: No Necrotic Amount: None Present (0%) Fat Layer Exposed: No Tendon Exposed: No MERYLE, PACIFICO. ([id number]) Muscle Exposed: No Joint Exposed: No Bone Exposed: No Limited to Skin Breakdown Periwound Skin Texture Texture Color No Abnormalities Noted: No No Abnormalities Noted: No Callus: No Atrophie Blanche: No Crepitus: No Cyanosis: No Excoriation: No Ecchymosis: No Fluctuance: No Erythema: No Friable: No Hemosiderin Staining: No Induration: No Mottled: No Localized Edema: No Pallor: No Rash: No Rubor: No Scarring: No Temperature / Pain Moisture Temperature: No Abnormality No Abnormalities Noted: No Dry / Scaly: No Maceration: No Moist: Yes Wound Preparation Ulcer Cleansing: Other: soap and water, Topical Anesthetic Applied: Other: LIDOCAINE 4%, Treatment Notes Wound #2 (Left Calcaneous) 4. Dressing  Applied: Other dressing (specify in notes) 7. Secured with Patient to wear own compression stockings Notes siltec sorbact Electronic Signature(s) Signed: 12/16/2015 3:35:50 PM By: Regan Lemming BSN, RN Entered By: Regan Lemming on 12/16/2015 08:13:13 [name] [name] ([id number]) -------------------------------------------------------------------------------- Vitals Details Patient Name: [name] [name] Date of Service: 12/16/2015 8:00 AM Medical Record Number: [id number] Patient Account Number: [id number] Date of Birth/Sex: [birth year] (75 y.o. Male) Treating RN: Afful, RN, BSN, [place] Primary Care Physician: Thersa Salt Other Clinician: Referring Physician: Thersa Salt Treating Physician/Extender: Frann Rider in Treatment: 3 Vital Signs Time Taken: 08:14 Temperature (F): 98.3 Height (in): 71 Pulse (bpm): 70 Weight (lbs): 176 Respiratory Rate (breaths/min): 16 Body Mass Index (BMI): 24.5 Blood Pressure (mmHg): 134/63 Reference Range: 80 - 120 mg / dl Electronic Signature(s) Signed: 12/16/2015 3:35:50 PM By: Regan Lemming BSN, RN Entered By: Regan Lemming on 12/16/2015 08:14:46

## 2015-12-18 NOTE — Progress Notes (Signed)
[name] [name], [name] [name] ([id number]) Visit Report for 12/16/2015 Chief Complaint Document Details Patient Name: [name] [name], [name] [name]. Date of Service: 12/16/2015 8:00 AM Medical Record Number: [id number] Patient Account Number: [id number] Date of Birth/Sex: 1941-09-05 (75 y.o. Male) Treating RN: Baruch Gouty, RN, BSN, Velva Harman Primary Care Physician: Thersa Salt Other Clinician: Referring Physician: Thersa Salt Treating Physician/Extender: Frann Rider in Treatment: 3 Information Obtained from: Patient Chief Complaint Patients presents for treatment of an open diabetic ulcer to the left heel on the posterior- lateral part of the heel. This is a recurrent swelling and ulceration for about 3 weeks now. Electronic Signature(s) Signed: 12/16/2015 8:44:30 AM By: Christin Fudge MD, FACS Entered By: Christin Fudge on 12/16/2015 08:44:30 [name] [name] ([id number]) -------------------------------------------------------------------------------- Debridement Details Patient Name: [name] [name] Date of Service: 12/16/2015 8:00 AM Medical Record Number: [id number] Patient Account Number: [id number] Date of Birth/Sex: 1940/12/25 (75 y.o. Male) Treating RN: Baruch Gouty, RN, BSN, Velva Harman Primary Care Physician: Thersa Salt Other Clinician: Referring Physician: Thersa Salt Treating Physician/Extender: Frann Rider in Treatment: 3 Debridement Performed for Wound #2 Left Calcaneous Assessment: Performed By: Physician Christin Fudge, MD Debridement: Open Wound/Selective Debridement Selective Description: Pre-procedure Yes Verification/Time Out Taken: Start Time: 08:26 Pain Control: Lidocaine 4% Topical Solution Total Area Debrided (L x 0.5 (cm) x 0.3 (cm) = 0.15 (cm) W): Tissue and other Non-Viable, Callus, Eschar, Fibrin/Slough material debrided: Instrument: Forceps, Scissors Bleeding: Minimum Hemostasis Achieved: Pressure End Time: 08:30 Procedural Pain: 0 Post Procedural Pain: 0 Response to Treatment:  Procedure was tolerated well Post Debridement Measurements of Total Wound Length: (cm) 2.8 Width: (cm) 1 Depth: (cm) 0.1 Volume: (cm) 0.22 Post Procedure Diagnosis Same as Pre-procedure Electronic Signature(s) Signed: 12/16/2015 8:44:23 AM By: Christin Fudge MD, FACS Signed: 12/16/2015 3:35:50 PM By: Regan Lemming BSN, RN Entered By: Christin Fudge on 12/16/2015 08:44:23 [name] [name] ([id number]) -------------------------------------------------------------------------------- HPI Details Patient Name: [name] [name] Date of Service: 12/16/2015 8:00 AM Medical Record Number: [id number] Patient Account Number: [id number] Date of Birth/Sex: 1941-10-29 (75 y.o. Male) Treating RN: Baruch Gouty, RN, BSN, [place] Primary Care Physician: Thersa Salt Other Clinician: Referring Physician: Thersa Salt Treating Physician/Extender: Frann Rider in Treatment: 3 History of Present Illness Location: left posterior lateral heel Quality: Patient reports No Pain. Severity: Patient states wound (s) are getting better. Duration: Patient has had the wound for < 4 weeks prior to presenting for treatment Context: The wound occurred when the patient had an abrasion while pulling on stockings Modifying Factors: Other treatment(s) tried include:compression with unknown as both Associated Signs and Symptoms: Patient reports having increase swelling of his left lower extremity.Marland Kitchen HPI Description: 75 year old gentleman who is known to me from a previous wound care consultation and follow-up between 03/23/2015 and 09/07/2015. During that time he had a ulceration on the left lateral heel which was treated with conservative therapy including 5 applications of Epifix. The patient kept having a recurrence of this ulceration and went for a second opinion to [facility] and was seen there by Dr. Martyn Malay. 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 X-ray of the foot showed no erosions. He was treated with Unna boot and silver nitrate and healed completely on 10/19/2015. This is a recurrence after about 3 weeks now. This time around he was also referred to Dr. Hal Hope the vascular surgeon for possible endovenous ablation. I  do not have this official report -- but as for the patient's verbal report-- the vascular surgeon did a complete workup and did not find any surgical intervention which was going to help him and suggested that this an atypical ulceration is possibly not due to venous incompetence. I have asked for this report. Past medical history -- known to be a diabetic on treatment for the last 30 years and takes insulin for this. He's been a nonsmoker all his life and hasn't had no problems with any arterial disease and has had the saphenous vein harvesting on both legs in the past. 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 He had 5 applications of a prefix starting 06/29/2015 -- 0000000 - final application of a Epifix. 12/02/2015 -- he was seen by his urologist for a possible left inguinal hernia but was found to have lymphadenitis on the left side. He was treated with antibiotics for a few days and he seems to feel a little better as far as the swelling goes. No imaging was done at this stage. 12/16/2015 -- he has not heard back from the lymphedema pump vendors and has not use these during the last 2 weeks. He's been using his juxta light both the foot stocking and the compression Velcro wrap as before and feels that his knee and thigh have increased significantly in size due to the edema. [name] [name], [name] [name]  ([id number]) Electronic Signature(s) Signed: 12/16/2015 8:45:36 AM By: Christin Fudge MD, FACS Entered By: Christin Fudge on 12/16/2015 08:45:35 [name] [name] ([id number]) -------------------------------------------------------------------------------- Physical Exam Details Patient Name: [name] [name] Date of Service: 12/16/2015 8:00 AM Medical Record Number: [id number] Patient Account Number: [id number] Date of Birth/Sex: 26-Feb-1941 (75 y.o. Male) Treating RN: Baruch Gouty, RN, BSN, Velva Harman Primary Care Physician: Thersa Salt Other Clinician: Referring Physician: Thersa Salt Treating Physician/Extender: Frann Rider in Treatment: 3 Constitutional . Pulse regular. Respirations normal and unlabored. Afebrile. . Eyes Nonicteric. Reactive to light. Ears, Nose, Mouth, and Throat Lips, teeth, and gums WNL.Marland Kitchen Moist mucosa without lesions. Neck supple and nontender. No palpable supraclavicular or cervical adenopathy. Normal sized without goiter. Respiratory WNL. No retractions.. Cardiovascular Pedal Pulses WNL. there is stage II lymphedema in the area around his knee and thigh. The lymphedema along the foot and cough is minimal. Chest Breasts symmetical and no nipple discharge.. Breast tissue WNL, no masses, lumps, or tenderness.. Musculoskeletal Adexa without tenderness or enlargement.. Digits and nails w/o clubbing, cyanosis, infection, petechiae, ischemia, or inflammatory conditions.. Integumentary (Hair, Skin) No suspicious lesions. No crepitus or fluctuance. No peri-wound warmth or erythema. No masses.Marland Kitchen Psychiatric Judgement and insight Intact.. No evidence of depression, anxiety, or agitation.. Notes the ulceration on the left lateral heel has significant eschar and once this was sharply removed with forceps and scissors there are open ulcerations at the base. Electronic Signature(s) Signed: 12/16/2015 8:48:47 AM By: Christin Fudge MD, FACS Previous Signature: 12/16/2015 8:48:33 AM  Version By: Christin Fudge MD, FACS Entered By: Christin Fudge on 12/16/2015 08:48:47 [name] [name] ([id number]) -------------------------------------------------------------------------------- Physician Orders Details Patient Name: [name] [name] Date of Service: 12/16/2015 8:00 AM Medical Record Number: [id number] Patient Account Number: [id number] Date of Birth/Sex: 04-10-1941 (75 y.o. Male) Treating RN: Baruch Gouty, RN, BSN, Velva Harman Primary Care Physician: Thersa Salt Other Clinician: Referring Physician: Thersa Salt  Treating Physician/Extender: Frann Rider in Treatment: 3 Verbal / Phone Orders: Yes Clinician: Afful, RN, BSN, Rita Read Back and Verified: Yes Diagnosis Coding Wound Cleansing Wound #2 Left Calcaneous o Clean wound with Normal Saline. Skin Barriers/Peri-Wound Care Wound #2 Left Calcaneous o Skin Prep Primary Wound Dressing Wound #2 Left Calcaneous o Other: - Siltec Sorbact Dressing Change Frequency Wound #2 Left Calcaneous o Change dressing every week Follow-up Appointments Wound #2 Left Calcaneous o Return Appointment in 1 week. Edema Control Wound #2 Left Calcaneous o Patient to wear own Juxtalite/Juzo compression garment. o Elevate legs to the level of the heart and pump ankles as often as possible Electronic Signature(s) Signed: 12/16/2015 3:35:50 PM By: Regan Lemming BSN, RN Signed: 12/17/2015 5:05:49 PM By: Christin Fudge MD, FACS Entered By: Regan Lemming on 12/16/2015 08:38:45 [name] [name] ([id number]) -------------------------------------------------------------------------------- Problem List Details Patient Name: [name] [name] Date of Service: 12/16/2015 8:00 AM Medical Record Number: [id number] Patient Account Number: [id number] Date of Birth/Sex: [birth year] (75 y.o. 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 Male) Treating RN: Baruch Gouty, RN, BSN, [place] Primary Care Physician: Thersa Salt Other Clinician: Referring Physician: Thersa Salt Treating Physician/Extender: Frann Rider in Treatment: 3 Subjective Chief Complaint Information obtained from Patient Patients presents for treatment of an open diabetic ulcer to the left heel on the posterior- lateral part of the heel. This is a recurrent swelling and ulceration for about 3 weeks now. History of Present Illness (HPI) The following HPI elements were documented for the patient's wound: Location: left posterior lateral heel Quality: Patient reports No Pain. Severity: Patient states wound (s) are getting better. Duration: Patient has had the wound for < 4 weeks prior to presenting for treatment Context: The wound occurred when the patient had an abrasion while pulling on stockings Modifying Factors: Other treatment(s) tried include:compression with unknown as both Associated Signs and Symptoms: Patient reports having increase swelling of his left lower  extremity.. 75 year old gentleman who is known to me from a previous wound care consultation and follow-up between 03/23/2015 and 09/07/2015. During that time he had a ulceration on the left lateral heel which was treated with conservative therapy including 5 applications of Epifix. The patient kept having a recurrence of this ulceration and went for a second opinion to [facility] and was seen there by Dr. Martyn Malay. 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 X-ray of the foot showed no erosions. He was treated with Unna boot and silver nitrate and healed completely on 10/19/2015. This is a recurrence after about 3 weeks now. This time  around he was also referred to Dr. Hal Hope the vascular surgeon for possible endovenous ablation. I do not have this official report -- but as for the patient's verbal report-- the vascular surgeon did a complete workup and did not find any surgical intervention which was going to help him and suggested that this an atypical ulceration is possibly not due to venous incompetence. I have asked for this report. Past medical history -- known to be a diabetic on treatment for the last 30 years and takes insulin for this. He's been a nonsmoker all his life and hasn't had no problems with any arterial disease and has had the saphenous vein harvesting on both legs in the past. 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 He had 5 applications of a prefix starting 06/29/2015 -- 0000000 - final application of a Epifix. 12/02/2015 -- he was seen by his urologist for a possible left inguinal  hernia but was found to have [name] [name], [name] [name]. ([id number]) lymphadenitis on the left side. He was treated with antibiotics for a few days and he seems to feel a little better as far as the swelling goes. No imaging was done at this stage. 12/16/2015 -- he has not heard back from the lymphedema pump vendors and has not use these during the last 2 weeks. He's been using his juxta light both the foot stocking and the compression Velcro wrap as before and feels that his knee and thigh have increased significantly in size due to the edema. Objective Constitutional Pulse regular. Respirations normal and unlabored. Afebrile. Vitals Time Taken: 8:14 AM, Height: 71 in, Weight: 176 lbs, BMI: 24.5, Temperature: 98.3 F, Pulse: 70 bpm, Respiratory Rate: 16 breaths/min, Blood Pressure: 134/63 mmHg. Eyes Nonicteric. Reactive to light. Ears, Nose, Mouth, and Throat Lips, teeth, and gums WNL.Marland Kitchen Moist mucosa without lesions. Neck supple and nontender. No palpable supraclavicular or cervical adenopathy. Normal sized without goiter. Respiratory WNL. No retractions.. Cardiovascular Pedal Pulses WNL. there is stage II lymphedema in the area around his knee and thigh. The lymphedema along the foot and cough is minimal. Chest Breasts symmetical and no nipple discharge.. Breast tissue WNL, no masses, lumps, or tenderness.. Musculoskeletal Adexa without tenderness or enlargement.. Digits and nails w/o clubbing, cyanosis, infection, petechiae, ischemia, or inflammatory conditions.Marland Kitchen Psychiatric Judgement and insight Intact.. No evidence of depression, anxiety, or agitation.. General Notes: the ulceration on the left lateral heel has significant eschar and once this was sharply removed with forceps and scissors there are open ulcerations at the base. [name] [name], [name] [name] ([id number]) Integumentary (Hair, Skin) No suspicious lesions. No crepitus or fluctuance. No peri-wound warmth or erythema. No masses.. Wound #2  status is Open. Original cause of wound was Pressure Injury. The wound is located on the Left Calcaneous. The wound measures 0.5cm length x 0.3cm width x 0.1cm depth; 0.118cm^2 area and 0.012cm^3 volume. The wound is limited to skin breakdown. There is no tunneling or undermining noted. There is a large amount of serous drainage noted. The wound margin is distinct with the outline attached to the wound base. There is large (67-100%) red granulation within the wound bed. There is no necrotic tissue within the wound bed. The periwound skin appearance exhibited: Moist. The periwound skin appearance did not exhibit: Callus, Crepitus, Excoriation, Fluctuance, Friable,  Induration, Localized Edema, Rash, Scarring, Dry/Scaly, Maceration, Atrophie Blanche, Cyanosis, Ecchymosis, Hemosiderin Staining, Mottled, Pallor, Rubor, Erythema. Periwound temperature was noted as No Abnormality. 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 He would like to use only the compression stocking of the foot and not use the Velcro juxta light. I have recommended that he may need to get compression stockings of 30-40 minute total variety which is thigh-high. He wants to wait on this for a while. The lymphedema pump vendor has been contacted again and we will make certain that the appropriate orders are re-faxed. I have asked him to call his back by Monday if he does not hear from them. Procedures [name] [name], [name] [name] ([id number]) Wound  #2 Wound #2 is a Diabetic Wound/Ulcer of the Lower Extremity located on the Left Calcaneous . There was an Open Wound debridement with total area of 0.15 sq cm performed by Christin Fudge, MD. with the following instrument(s): Forceps and Scissors to remove Non-Viable tissue/material including Fibrin/Slough, Eschar, and Callus after achieving pain control using Lidocaine 4% Topical Solution. A time out was conducted prior to the start of the procedure. A Minimum amount of bleeding was controlled with Pressure. The procedure was tolerated well with a pain level of 0 throughout and a pain level of 0 following the procedure. Post Debridement Measurements: 2.8cm length x 1cm width x 0.1cm depth; 0.22cm^3 volume. Post procedure Diagnosis Wound #2: Same as Pre-Procedure Plan Wound Cleansing: Wound #2 Left Calcaneous: Clean wound with Normal Saline. Skin Barriers/Peri-Wound Care: Wound #2 Left Calcaneous: Skin Prep Primary Wound Dressing: Wound #2 Left Calcaneous: Other: - Siltec Sorbact Dressing Change Frequency: Wound #2 Left Calcaneous: Change dressing every week Follow-up Appointments: Wound #2 Left Calcaneous: Return Appointment in 1 week. Edema Control: Wound #2 Left Calcaneous: Patient to wear own Juxtalite/Juzo compression garment. Elevate legs to the level of the heart and pump ankles as often as possible After much discussion regarding the patient's lymphedema and the need for compression or the lack of it appropriately we have discussed dressing changes with several tech saw her back to be applied to the heel and changed on the when needed. He would like to use only the compression stocking of the foot and not use the Velcro juxta light. [name] [name], [name] [name] ([id number]) I have recommended that he may need to get compression stockings of 30-40 minute total variety which is thigh-high. He wants to wait on this for a while. The lymphedema pump vendor has been contacted again and we  will make certain that the appropriate orders are re-faxed. I have asked him to call his back by Monday if he does not hear from them. Electronic Signature(s) Signed: 12/16/2015 9:09:24 AM By: Christin Fudge MD, FACS Previous Signature: 12/16/2015 8:51:31 AM Version By: Christin Fudge MD, FACS Entered By: Christin Fudge on 12/16/2015 [id number] [name] [name] ([id number]) -------------------------------------------------------------------------------- SuperBill Details Patient Name: [name] [name] Date of Service: 12/16/2015 Medical Record Number: [id number] Patient Account Number: [id number] Date of Birth/Sex: Feb 22, 1941 (75 y.o. 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 Facility Procedures CPT4: Description Modifier Quantity Code [id number] - DEBRIDE WOUND 1ST 20 SQ CM OR < 1 ICD-10 Description Diagnosis E11.621 Type 2 diabetes mellitus with foot ulcer 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 to breakdown of  skin I89.0 Lymphedema, not elsewhere classified 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 Physician Procedures CPT4: Description Modifier Quantity Code [id number] - WC PHYS LEVEL 3 - EST PT 25 1 ICD-10 Description Diagnosis E11.621 Type 2 diabetes mellitus with foot ulcer 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

## 2015-12-23 ENCOUNTER — Encounter: Payer: Medicare Other | Admitting: Surgery

## 2015-12-23 DIAGNOSIS — L97429 Non-pressure chronic ulcer of left heel and midfoot with unspecified severity: Secondary | ICD-10-CM | POA: Diagnosis not present

## 2015-12-24 NOTE — Progress Notes (Addendum)
PERLA, LETNER ([id number]) Visit Report for 12/23/2015 Arrival Information Details Patient Name: [name] [name], [name] [name] Date of Service: 12/23/2015 8:00 AM Medical Record Number: [id number] Patient Account Number: [id number] Date of Birth/Sex: 03-02-1941 (75 y.o. Male) Treating RN: Montey Hora Primary Care Physician: Thersa Salt Other Clinician: Referring Physician: Thersa Salt Treating Physician/Extender: Frann Rider in Treatment: 4 Visit Information History Since Last Visit Added or deleted any medications: No Patient Arrived: Ambulatory Any new allergies or adverse reactions: No Arrival Time: 08:12 Had a fall or experienced change in No Accompanied By: self activities of daily living that may affect Transfer Assistance: None risk of falls: Patient Identification Verified: Yes Signs or symptoms of abuse/neglect since last No Secondary Verification Process Yes visito Completed: Hospitalized since last visit: No Patient Requires Transmission-Based No Pain Present Now: No Precautions: Patient Has Alerts: No Electronic Signature(s) Signed: 12/23/2015 5:05:29 PM By: Montey Hora Entered By: Montey Hora on 12/23/2015 08:12:30 [name] [name] ([id number]) -------------------------------------------------------------------------------- Clinic Level of Care Assessment Details Patient Name: [name] [name] Date of Service: 12/23/2015 8:00 AM Medical Record Number: [id number] Patient Account Number: [id number] Date of Birth/Sex: 1941-01-06 (75 y.o. Male) Treating RN: Baruch Gouty, RN, BSN, [place] Primary Care Physician: Thersa Salt Other Clinician: Referring Physician: Thersa Salt Treating Physician/Extender: Frann Rider in Treatment: 4 Clinic Level of Care Assessment Items TOOL 4 Quantity Score []  - Use when only an EandM is performed on FOLLOW-UP visit 0 ASSESSMENTS - Nursing Assessment / Reassessment X - Reassessment of Co-morbidities (includes updates in patient  status) 1 10 X - Reassessment of Adherence to Treatment Plan 1 5 ASSESSMENTS - Wound and Skin Assessment / Reassessment X - Simple Wound Assessment / Reassessment - one wound 1 5 []  - Complex Wound Assessment / Reassessment - multiple wounds 0 []  - Dermatologic / Skin Assessment (not related to wound area) 0 ASSESSMENTS - Focused Assessment []  - Circumferential Edema Measurements - multi extremities 0 []  - Nutritional Assessment / Counseling / Intervention 0 X - Lower Extremity Assessment (monofilament, tuning fork, pulses) 1 5 []  - Peripheral Arterial Disease Assessment (using hand held doppler) 0 ASSESSMENTS - Ostomy and/or Continence Assessment and Care []  - Incontinence Assessment and Management 0 []  - Ostomy Care Assessment and Management (repouching, etc.) 0 PROCESS - Coordination of Care X - Simple Patient / Family Education for ongoing care 1 15 []  - Complex (extensive) Patient / Family Education for ongoing care 0 []  - Staff obtains Programmer, systems, Records, Test Results / Process Orders 0 []  - Staff telephones HHA, Nursing Homes / Clarify orders / etc 0 []  - Routine Transfer to another Facility (non-emergent condition) 0 ELIHU, THELIN. ([id number]) []  - Routine Hospital Admission (non-emergent condition) 0 []  - New Admissions / Biomedical engineer / Ordering NPWT, Apligraf, etc. 0 []  - Emergency Hospital Admission (emergent condition) 0 []  - Simple Discharge Coordination 0 []  - Complex (extensive) Discharge Coordination 0 PROCESS - Special Needs []  - Pediatric / Minor Patient Management 0 []  - Isolation Patient Management 0 []  - Hearing / Language / Visual special needs 0 []  - Assessment of Community assistance (transportation, D/C planning, etc.) 0 []  - Additional assistance / Altered mentation 0 []  - Support Surface(s) Assessment (bed, cushion, seat, etc.) 0 INTERVENTIONS - Wound Cleansing / Measurement X - Simple Wound Cleansing - one wound 1 5 []  - Complex Wound  Cleansing - multiple wounds 0 X - Wound Imaging (photographs - any number of wounds) 1 5 []  - Wound Tracing (instead of photographs) 0  X - Simple Wound Measurement - one wound 1 5 []  - Complex Wound Measurement - multiple wounds 0 INTERVENTIONS - Wound Dressings X - Small Wound Dressing one or multiple wounds 1 10 []  - Medium Wound Dressing one or multiple wounds 0 []  - Large Wound Dressing one or multiple wounds 0 []  - Application of Medications - topical 0 []  - Application of Medications - injection 0 INTERVENTIONS - Miscellaneous []  - External ear exam 0 REILLEY, DOLAN. ([id number]) []  - Specimen Collection (cultures, biopsies, blood, body fluids, etc.) 0 []  - Specimen(s) / Culture(s) sent or taken to Lab for analysis 0 []  - Patient Transfer (multiple staff / Harrel Lemon Lift / Similar devices) 0 []  - Simple Staple / Suture removal (25 or less) 0 []  - Complex Staple / Suture removal (26 or more) 0 []  - Hypo / Hyperglycemic Management (close monitor of Blood Glucose) 0 []  - Ankle / Brachial Index (ABI) - do not check if billed separately 0 X - Vital Signs 1 5 Has the patient been seen at the hospital within the last three years: Yes Total Score: 70 Level Of Care: New/Established - Level 2 Electronic Signature(s) Signed: 12/23/2015 5:20:37 PM By: Regan Lemming BSN, RN Entered By: Regan Lemming on 12/23/2015 08:39:25 [name] [name] ([id number]) -------------------------------------------------------------------------------- Encounter Discharge Information Details Patient Name: [name] [name] Date of Service: 12/23/2015 8:00 AM Medical Record Number: [id number] Patient Account Number: [id number] Date of Birth/Sex: 03-28-1941 (75 y.o. Male) Treating RN: Montey Hora Primary Care Physician: Thersa Salt Other Clinician: Referring Physician: Thersa Salt Treating Physician/Extender: Frann Rider in Treatment: 4 Encounter Discharge Information Items Discharge Pain Level: 0 Discharge  Condition: Stable Ambulatory Status: Ambulatory Discharge Destination: Home Transportation: Private Auto Accompanied By: self Schedule Follow-up Appointment: Yes Medication Reconciliation completed and provided to Patient/Care No Suleiman Finigan: Provided on Clinical Summary of Care: 12/23/2015 Form Type Recipient Paper Patient RO Electronic Signature(s) Signed: 12/23/2015 8:49:00 AM By: Ruthine Dose Entered By: Ruthine Dose on 12/23/2015 08:49:00 [name] [name] ([id number]) -------------------------------------------------------------------------------- Lower Extremity Assessment Details Patient Name: [name] [name] Date of Service: 12/23/2015 8:00 AM Medical Record Number: [id number] Patient Account Number: [id number] Date of Birth/Sex: [birth year] (75 y.o. Male) Treating RN: Montey Hora Primary Care Physician: Thersa Salt Other Clinician: Referring Physician: Thersa Salt Treating Physician/Extender: Frann Rider in Treatment: 4 Edema Assessment Assessed: [Left: No] [Right: No] Edema: [Left: Ye] [Right: s] Vascular Assessment Pulses: Posterior Tibial Dorsalis Pedis Palpable: [Left:Yes] Extremity colors, hair growth, and conditions: Extremity Color: [Left:Normal] Hair Growth on Extremity: [Left:Yes] Temperature of Extremity: [Left:Warm] Capillary Refill: [Left:< 3 seconds] Electronic Signature(s) Signed: 12/23/2015 5:05:29 PM By: Montey Hora Entered By: Montey Hora on 12/23/2015 08:13:46 [name] [name] ([id number]) -------------------------------------------------------------------------------- Multi Wound Chart Details Patient Name: [name] [name] Date of Service: 12/23/2015 8:00 AM Medical Record Number: [id number] Patient Account Number: [id number] Date of Birth/Sex: 1941/04/07 (75 y.o. Male) Treating RN: Baruch Gouty, RN, BSN, [place] Primary Care Physician: Thersa Salt Other Clinician: Referring Physician: Thersa Salt Treating Physician/Extender: Frann Rider in Treatment: 4 Vital Signs Height(in): 71 Pulse(bpm): 66 Weight(lbs): 176 Blood Pressure 115/66 (mmHg): Body Mass Index(BMI): 25 Temperature(F): 98.1 Respiratory Rate 16 (breaths/min): Photos: [2:No Photos] [N/A:N/A] Wound Location: [2:Left Calcaneous] [N/A:N/A] Wounding Event: [2:Pressure Injury] [N/A:N/A] Primary Etiology: [2:Diabetic Wound/Ulcer of the Lower Extremity] [N/A:N/A] Comorbid History: [2:Type II Diabetes] [N/A:N/A] Date Acquired: [2:03/23/2015] [N/A:N/A] Weeks of Treatment: [2:4] [N/A:N/A] Wound Status: [2:Open] [N/A:N/A] Measurements L x W x D 2.8x1.9x0.1 [N/A:N/A] (cm) Area (cm) : [2:4.178] [N/A:N/A] Volume (cm) : [  2:0.418] [N/A:N/A] % Reduction in Area: [2:-963.10%] [N/A:N/A] % Reduction in Volume: -971.80% [N/A:N/A] Classification: [2:Grade 0] [N/A:N/A] Exudate Amount: [2:Large] [N/A:N/A] Exudate Type: [2:Serous] [N/A:N/A] Exudate Color: [2:amber] [N/A:N/A] Wound Margin: [2:Distinct, outline attached] [N/A:N/A] Granulation Amount: [2:Large (67-100%)] [N/A:N/A] Granulation Quality: [2:Red] [N/A:N/A] Necrotic Amount: [2:None Present (0%)] [N/A:N/A] Exposed Structures: [2:Fascia: No Fat: No Tendon: No Muscle: No Joint: No Bone: No] [N/A:N/A] Limited to Skin Breakdown Epithelialization: Large (67-100%) N/A N/A Periwound Skin Texture: Edema: No N/A N/A Excoriation: No Induration: No Callus: No Crepitus: No Fluctuance: No Friable: No Rash: No Scarring: No Periwound Skin Moist: Yes N/A N/A Moisture: Maceration: No Dry/Scaly: No Periwound Skin Color: Atrophie Blanche: No N/A N/A Cyanosis: No Ecchymosis: No Erythema: No Hemosiderin Staining: No Mottled: No Pallor: No Rubor: No Temperature: No Abnormality N/A N/A Tenderness on No N/A N/A Palpation: Wound Preparation: Ulcer Cleansing: N/A N/A Rinsed/Irrigated with Saline Topical Anesthetic Applied: Other: LIDOCAINE 4% Treatment Notes Electronic Signature(s) Signed:  12/23/2015 5:20:37 PM By: Regan Lemming BSN, RN Entered By: Regan Lemming on 12/23/2015 08:32:01 [name] [name] ([id number]) -------------------------------------------------------------------------------- [place] Details Patient Name: [name] [name] Date of Service: 12/23/2015 8:00 AM Medical Record Number: [id number] Patient Account Number: [id number] Date of Birth/Sex: 1940-12-19 (75 y.o. Male) Treating RN: Baruch Gouty, RN, BSN, Velva Harman Primary Care Physician: Thersa Salt Other Clinician: Referring Physician: Thersa Salt Treating Physician/Extender: Frann Rider in Treatment: 4 Active Inactive Electronic Signature(s) Signed: 02/15/2016 1:27:45 PM By: Montey Hora Signed: 08/08/2016 3:29:10 PM By: Regan Lemming BSN, RN Previous Signature: 12/23/2015 5:20:37 PM Version By: Regan Lemming BSN, RN Entered By: Montey Hora on 02/15/2016 13:27:45 [name] [name] ([id number]) -------------------------------------------------------------------------------- Patient/Caregiver Education Details Patient Name: [name] [name] Date of Service: 12/23/2015 8:00 AM Medical Record Number: [id number] Patient Account Number: [id number] Date of Birth/Gender: 03/22/1941 (75 y.o. Male) Treating RN: Montey Hora Primary Care Physician: Thersa Salt Other Clinician: Referring Physician: Thersa Salt Treating Physician/Extender: Frann Rider in Treatment: 4 Education Assessment Education Provided To: Patient Education Topics Provided Venous: Handouts: Other: use lymph pumps once you get them Methods: Demonstration, Explain/Verbal Responses: State content correctly Electronic Signature(s) Signed: 12/23/2015 5:05:29 PM By: Montey Hora Entered By: Montey Hora on 12/23/2015 08:45:39 [name] [name] ([id number]) -------------------------------------------------------------------------------- Wound Assessment Details Patient Name: [name] [name] Date of Service: 12/23/2015 8:00  AM Medical Record Number: [id number] Patient Account Number: [id number] Date of Birth/Sex: [birth year] (75 y.o. Male) Treating RN: Montey Hora Primary Care Physician: Thersa Salt Other Clinician: Referring Physician: Thersa Salt Treating Physician/Extender: Frann Rider in Treatment: 4 Wound Status Wound Number: 2 Primary Diabetic Wound/Ulcer of the Lower Etiology: Extremity Wound Location: Left Calcaneous Wound Status: Open Wounding Event: Pressure Injury Comorbid Type II Diabetes Date Acquired: 03/23/2015 History: Weeks Of Treatment: 4 Clustered Wound: No Photos Photo Uploaded By: Montey Hora on 12/23/2015 11:27:18 Wound Measurements Length: (cm) 2.8 Width: (cm) 1.9 Depth: (cm) 0.1 Area: (cm) 4.178 Volume: (cm) 0.418 % Reduction in Area: -963.1% % Reduction in Volume: -971.8% Epithelialization: Large (67-100%) Tunneling: No Undermining: No Wound Description Classification: Grade 0 Wound Margin: Distinct, outline attached Exudate Amount: Large Exudate Type: Serous Exudate Color: amber Foul Odor After Cleansing: No Wound Bed Granulation Amount: Large (67-100%) Exposed Structure Granulation Quality: Red Fascia Exposed: No Necrotic Amount: None Present (0%) Fat Layer Exposed: No Tendon Exposed: No TYRONN, ONNEN. ([id number]) Muscle Exposed: No Joint Exposed: No Bone Exposed: No Limited to Skin Breakdown Periwound Skin Texture Texture Color No Abnormalities Noted: No No Abnormalities Noted: No Callus: No Atrophie Blanche:  No Crepitus: No Cyanosis: No Excoriation: No Ecchymosis: No Fluctuance: No Erythema: No Friable: No Hemosiderin Staining: No Induration: No Mottled: No Localized Edema: No Pallor: No Rash: No Rubor: No Scarring: No Temperature / Pain Moisture Temperature: No Abnormality No Abnormalities Noted: No Dry / Scaly: No Maceration: No Moist: Yes Wound Preparation Ulcer Cleansing: Rinsed/Irrigated with Saline Topical  Anesthetic Applied: Other: LIDOCAINE 4%, Electronic Signature(s) Signed: 12/23/2015 5:05:29 PM By: Montey Hora Entered By: Montey Hora on 12/23/2015 08:19:09 [name] [name] ([id number]) -------------------------------------------------------------------------------- Vitals Details Patient Name: [name] [name] Date of Service: 12/23/2015 8:00 AM Medical Record Number: [id number] Patient Account Number: [id number] Date of Birth/Sex: 01/18/1941 (75 y.o. Male) Treating RN: Montey Hora Primary Care Physician: Thersa Salt Other Clinician: Referring Physician: Thersa Salt Treating Physician/Extender: Frann Rider in Treatment: 4 Vital Signs Time Taken: 08:20 Temperature (F): 98.1 Height (in): 71 Pulse (bpm): 66 Weight (lbs): 176 Respiratory Rate (breaths/min): 16 Body Mass Index (BMI): 24.5 Blood Pressure (mmHg): 115/66 Reference Range: 80 - 120 mg / dl Electronic Signature(s) Signed: 12/23/2015 5:05:29 PM By: Montey Hora Entered By: Montey Hora on 12/23/2015 08:22:08

## 2015-12-24 NOTE — Progress Notes (Signed)
EOIN, ANDRADA ([id number]) Visit Report for 12/23/2015 Chief Complaint Document Details Patient Name: [name] [name], [name] [name] Date of Service: 12/23/2015 8:00 AM Medical Record Number: [id number] Patient Account Number: [id number] Date of Birth/Sex: January 24, 1941 (75 y.o. Male) Treating RN: Montey Hora Primary Care Physician: Thersa Salt Other Clinician: Referring Physician: Thersa Salt Treating Physician/Extender: Frann Rider in Treatment: 4 Information Obtained from: Patient Chief Complaint Patients presents for treatment of an open diabetic ulcer to the left heel on the posterior- lateral part of the heel. This is a recurrent swelling and ulceration for about 3 weeks now. Electronic Signature(s) Signed: 12/23/2015 8:43:15 AM By: Christin Fudge MD, FACS Entered By: Christin Fudge on 12/23/2015 08:43:15 [name] [name] ([id number]) -------------------------------------------------------------------------------- HPI Details Patient Name: [name] [name] Date of Service: 12/23/2015 8:00 AM Medical Record Number: [id number] Patient Account Number: [id number] Date of Birth/Sex: 11/20/1941 (75 y.o. Male) Treating RN: Montey Hora Primary Care Physician: Thersa Salt Other Clinician: Referring Physician: Thersa Salt Treating Physician/Extender: Frann Rider in Treatment: 4 History of Present Illness Location: left posterior lateral heel Quality: Patient reports No Pain. Severity: Patient states wound (s) are getting better. Duration: Patient has had the wound for < 4 weeks prior to presenting for treatment Context: The wound occurred when the patient had an abrasion while pulling on stockings Modifying Factors: Other treatment(s) tried include:compression with unknown as both Associated Signs and Symptoms: Patient reports having increase swelling of his left lower extremity.Marland Kitchen HPI Description: 75 year old gentleman who is known to me from a previous wound care consultation  and follow-up between 03/23/2015 and 09/07/2015. During that time he had a ulceration on the left lateral heel which was treated with conservative therapy including 5 applications of Epifix. The patient kept having a recurrence of this ulceration and went for a second opinion to [facility] and was seen there by Dr. Martyn Malay. 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 X-ray of the foot showed no erosions. He was treated with Unna boot and silver nitrate and healed completely on 10/19/2015. This is a recurrence after about 3 weeks now. This time around he was also referred to Dr. Hal Hope the vascular surgeon for possible endovenous ablation. I do not have this official report -- but as for the patient's verbal report-- the vascular surgeon did a complete workup and did not find any surgical intervention which was going to help him and suggested that this an atypical ulceration is possibly not due to venous incompetence. I have asked for this report. Past medical history -- known to be a diabetic on treatment for the last 30 years and takes insulin for this. He's been a nonsmoker all his life and hasn't had no problems with any arterial disease and has had the saphenous vein harvesting on both legs in the past. 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 He had 5 applications of a prefix starting 06/29/2015 -- 0000000 - final application of a Epifix. 12/02/2015 -- he was seen by his urologist for a possible left inguinal hernia but was found to have lymphadenitis on the left side. He was treated with antibiotics for  a few days and he seems to feel a little better as far as the swelling goes. No imaging was done at this stage. 12/16/2015 -- he has not heard back from the lymphedema pump vendors and has not use these during the last 2 weeks. He's been using his juxta light both the foot stocking and the compression Velcro wrap as before and feels that his knee and thigh have increased significantly in size due to the edema. 12/23/2015 -- the lymphedema pump vendor has gotten touched with the patient and the orders being CINCH, RIPPLE. ([id number]) processed. He has not physically received the pumps yet. His knee continues to have edema but he is using the juxta light both the foot and the leg component. Electronic Signature(s) Signed: 12/23/2015 8:44:25 AM By: Christin Fudge MD, FACS Entered By: Christin Fudge on 12/23/2015 08:44:25 [name] [name] ([id number]) -------------------------------------------------------------------------------- Physical Exam Details Patient Name: [name] [name] Date of Service: 12/23/2015 8:00 AM Medical Record Number: [id number] Patient Account Number: [id number] Date of Birth/Sex: September 16, 1941 (75 y.o. Male) Treating RN: Montey Hora Primary Care Physician: Thersa Salt Other Clinician: Referring Physician: Thersa Salt Treating Physician/Extender: Frann Rider in Treatment: 4 Constitutional . Pulse regular. Respirations normal and unlabored. Afebrile. . Eyes Nonicteric. Reactive to light. Ears, Nose, Mouth, and Throat Lips, teeth, and gums WNL.Marland Kitchen Moist mucosa without lesions. Neck supple and nontender. No palpable supraclavicular or cervical adenopathy. Normal sized without goiter. Respiratory WNL. No retractions.. Breath sounds WNL, No rubs, rales, rhonchi, or wheeze.. Cardiovascular Heart rhythm and rate regular, no murmur or gallop.. Pedal Pulses WNL. No clubbing, cyanosis or edema. Chest Breasts symmetical and no nipple discharge.. Breast tissue WNL, no  masses, lumps, or tenderness.. Lymphatic No adneopathy. No adenopathy. No adenopathy. Musculoskeletal Adexa without tenderness or enlargement.. Digits and nails w/o clubbing, cyanosis, infection, petechiae, ischemia, or inflammatory conditions.. Integumentary (Hair, Skin) No suspicious lesions. No crepitus or fluctuance. No peri-wound warmth or erythema. No masses.Marland Kitchen Psychiatric Judgement and insight Intact.. No evidence of depression, anxiety, or agitation.. Notes the wound is clean and granulated and there was a loose flap of eschar which was gently trimmed. Electronic Signature(s) Signed: 12/23/2015 8:44:55 AM By: Christin Fudge MD, FACS Entered By: Christin Fudge on 12/23/2015 08:44:55 [name] [name] ([id number]) -------------------------------------------------------------------------------- Physician Orders Details Patient Name: [name] [name] Date of Service: 12/23/2015 8:00 AM Medical Record Number: [id number] Patient Account Number: [id number] Date of Birth/Sex: May 04, 1941 (75 y.o. Male) Treating RN: Baruch Gouty, RN, BSN, Velva Harman Primary Care Physician: Thersa Salt Other Clinician: Referring Physician: Thersa Salt Treating Physician/Extender: Frann Rider in Treatment: 4 Verbal / Phone Orders: Yes Clinician: Afful, RN, BSN, Rita Read Back and Verified: Yes Diagnosis Coding Wound Cleansing Wound #2 Left Calcaneous o Clean wound with Normal Saline. Skin Barriers/Peri-Wound Care Wound #2 Left Calcaneous o Skin Prep Primary Wound Dressing Wound #2 Left Calcaneous o Other: - Siltec Sorbact Dressing Change Frequency Wound #2 Left Calcaneous o Change dressing every week Follow-up Appointments Wound #2 Left Calcaneous o Return Appointment in 1 week. Edema Control Wound #2 Left Calcaneous o Patient to wear own Juxtalite/Juzo compression garment. o Elevate legs to the level of the heart and pump ankles as often as possible Electronic Signature(s) Signed:  12/23/2015 4:38:48 PM By: Christin Fudge MD, FACS Signed: 12/23/2015 5:20:37 PM By: Regan Lemming BSN, RN Entered By: Regan Lemming on 12/23/2015 08:38:37 [name] [name] ([id number]) -------------------------------------------------------------------------------- Problem List Details Patient Name: [name] [name] Date of Service: 12/23/2015 8:00 AM Medical Record Number: [id number] Patient  Account Number: [id number] Date of Birth/Sex: 1941/10/29 (75 y.o. 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 Male) Treating RN: Montey Hora Primary Care Physician: Thersa Salt Other Clinician: Referring Physician: Thersa Salt Treating Physician/Extender: Frann Rider in Treatment: 4 Subjective Chief Complaint Information obtained from Patient Patients presents for treatment of an open diabetic ulcer to the left heel on the posterior- lateral part of the heel. This is a recurrent swelling and ulceration  for about 3 weeks now. History of Present Illness (HPI) The following HPI elements were documented for the patient's wound: Location: left posterior lateral heel Quality: Patient reports No Pain. Severity: Patient states wound (s) are getting better. Duration: Patient has had the wound for < 4 weeks prior to presenting for treatment Context: The wound occurred when the patient had an abrasion while pulling on stockings Modifying Factors: Other treatment(s) tried include:compression with unknown as both Associated Signs and Symptoms: Patient reports having increase swelling of his left lower extremity.. 75 year old gentleman who is known to me from a previous wound care consultation and follow-up between 03/23/2015 and 09/07/2015. During that time he had a ulceration on the left lateral heel which was treated with conservative therapy including 5 applications of Epifix. The patient kept having a recurrence of this ulceration and went for a second opinion to [facility] and was seen there by Dr. Martyn Malay. 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 X-ray of the foot showed no erosions. He was treated with Unna boot and silver nitrate and healed completely on 10/19/2015. This is a recurrence after about 3 weeks now. This time around he was also referred to Dr. Hal Hope the vascular surgeon for possible endovenous ablation. I do not have this official report -- but as for the patient's verbal report-- the vascular surgeon did a complete workup and did not find any surgical intervention which was going to help him and suggested that this an atypical ulceration is possibly not due to venous incompetence. I have asked for this report. Past medical history -- known to be a diabetic on treatment for the last  30 years and takes insulin for this. He's been a nonsmoker all his life and hasn't had no problems with any arterial disease and has had the saphenous vein harvesting on both legs in the past. 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 He had 5  applications of a prefix starting 06/29/2015 -- 0000000 - final application of a Epifix. 12/02/2015 -- he was seen by his urologist for a possible left inguinal hernia but was found to have KAMAREN, DEADMOND. ([id number]) lymphadenitis on the left side. He was treated with antibiotics for a few days and he seems to feel a little better as far as the swelling goes. No imaging was done at this stage. 12/16/2015 -- he has not heard back from the lymphedema pump vendors and has not use these during the last 2 weeks. He's been using his juxta light both the foot stocking and the compression Velcro wrap as before and feels that his knee and thigh have increased significantly in size due to the edema. 12/23/2015 -- the lymphedema pump vendor has gotten touched with the patient and the orders being processed. He has not physically received the pumps yet. His knee continues to have edema but he is using the juxta light both the foot and the leg component. Objective Constitutional Pulse regular. Respirations normal and unlabored. Afebrile. Vitals Time Taken: 8:20 AM, Height: 71 in, Weight: 176 lbs, BMI: 24.5, Temperature: 98.1 F, Pulse: 66 bpm, Respiratory Rate: 16 breaths/min, Blood Pressure: 115/66 mmHg. Eyes Nonicteric. Reactive to light. Ears, Nose, Mouth, and Throat Lips, teeth, and gums WNL.Marland Kitchen Moist mucosa without lesions. Neck supple and nontender. No palpable supraclavicular or cervical adenopathy. Normal sized without goiter. Respiratory WNL. No retractions..  Breath sounds WNL, No rubs, rales, rhonchi, or wheeze.. Cardiovascular Heart rhythm and rate regular, no murmur or gallop.. Pedal Pulses WNL. No clubbing, cyanosis or edema. Chest Breasts symmetical and no nipple discharge.. Breast tissue WNL, no masses, lumps, or tenderness.. Lymphatic No adneopathy. No adenopathy. No adenopathy. Musculoskeletal Adexa without tenderness or enlargement.. Digits and nails w/o clubbing, cyanosis, infection, petechiae, ischemia, or inflammatory conditions.Marland Kitchen Psychiatric HOPPER, NORMANN ([id number]) Judgement and insight Intact.. No evidence of depression, anxiety, or agitation.. General Notes: the wound is clean and granulated and there was a loose flap of eschar which was gently trimmed. Integumentary (Hair, Skin) No suspicious lesions. No crepitus or fluctuance. No peri-wound warmth or erythema. No masses.. Wound #2 status is Open. Original cause of wound was Pressure Injury. The wound is located on the Left Calcaneous. The wound measures 2.8cm length x 1.9cm width x 0.1cm depth; 4.178cm^2 area and 0.418cm^3 volume. The wound is limited to skin breakdown. There is no tunneling or undermining noted. There is a large amount of serous drainage noted. The wound margin is distinct with the outline attached to the wound base. There is large (67-100%) red granulation within the wound bed. There is no necrotic tissue within the wound bed. The periwound skin appearance exhibited: Moist. The periwound skin appearance did not exhibit: Callus, Crepitus, Excoriation, Fluctuance, Friable, Induration, Localized Edema, Rash, Scarring, Dry/Scaly, Maceration, Atrophie Blanche, Cyanosis, Ecchymosis, Hemosiderin Staining, Mottled, Pallor, Rubor, Erythema. Periwound temperature was noted as No Abnormality. 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 Skin Barriers/Peri-Wound Care: Wound #2 Left Calcaneous: Skin Prep Primary Wound Dressing: Wound #2 Left Calcaneous: Other: - Siltec Sorbact Dressing Change Frequency: Wound #2 Left Calcaneous: ADRIANE, BRINCKS ([id number]) Change dressing every week Follow-up Appointments: Wound #2 Left  Calcaneous: Return Appointment in 1 week. Edema Control: Wound #2 Left Calcaneous: Patient to wear own Juxtalite/Juzo compression garment. Elevate legs to the level of the heart and pump ankles as often as possible The patient and I had a detailed discussion regarding his wound care. He will continue to use Siltec Sorbact over his wound and change it every third or fourth day as needed. he is also using his juxta lites diligently and will elevate the lymphedema pumps. He will come back and see as next week. Electronic Signature(s) Signed: 12/23/2015 8:46:20 AM By: Christin Fudge MD, FACS Entered By: Christin Fudge on 12/23/2015 08:46:19 [name] [name] ([id number]) -------------------------------------------------------------------------------- SuperBill Details Patient Name: [name] [name] Date of Service: 12/23/2015 Medical Record Number: [id number] Patient Account Number: [id number] Date of Birth/Sex: [birth year] (75 y.o. 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 I89.0 Lymphedema, not elsewhere classified I83.025 Varicose veins of left lower extremity with ulcer  other part of foot Facility Procedures CPT4 Code: [id number] Description: [id number] - WOUND CARE VISIT-LEV 2 EST PT Modifier: Quantity: 1 Physician Procedures CPT4: Description Modifier Quantity Code [id number] - WC PHYS LEVEL 3 - EST PT 1 ICD-10 Description Diagnosis E11.621 Type 2 diabetes mellitus with foot ulcer 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

## 2015-12-30 ENCOUNTER — Ambulatory Visit: Payer: BLUE CROSS/BLUE SHIELD | Admitting: Surgery

## 2016-01-04 ENCOUNTER — Telehealth: Payer: Self-pay | Admitting: Family Medicine

## 2016-01-04 ENCOUNTER — Other Ambulatory Visit: Payer: Self-pay

## 2016-01-04 MED ORDER — ONETOUCH ULTRASOFT LANCETS MISC: Status: DC

## 2016-01-04 NOTE — Telephone Encounter (Signed)
Pt called to change his pharmacy is [facility] on [address] and [address]. [name] [name]. The prescription One touch lancets ultra fine pt needs a new prescription. Call pt @ [phone]. Thank you!

## 2016-01-04 NOTE — Telephone Encounter (Signed)
filled

## 2016-01-11 ENCOUNTER — Ambulatory Visit (INDEPENDENT_AMBULATORY_CARE_PROVIDER_SITE_OTHER)
Admission: RE | Admit: 2016-01-11 | Discharge: 2016-01-11 | Disposition: A | Discharge status: Home / Self Care | Payer: Medicare Other | Source: Ambulatory Visit | Attending: Family Medicine | Admitting: Family Medicine

## 2016-01-11 ENCOUNTER — Encounter: Payer: Self-pay | Admitting: Family Medicine

## 2016-01-11 ENCOUNTER — Ambulatory Visit (INDEPENDENT_AMBULATORY_CARE_PROVIDER_SITE_OTHER): Payer: Medicare Other | Admitting: Family Medicine

## 2016-01-11 ENCOUNTER — Ambulatory Visit
Admission: RE | Admit: 2016-01-11 | Discharge: 2016-01-11 | Disposition: A | Discharge status: Home / Self Care | Payer: Medicare Other | Source: Ambulatory Visit | Attending: Family Medicine | Admitting: Family Medicine

## 2016-01-11 ENCOUNTER — Telehealth: Payer: Self-pay

## 2016-01-11 VITALS — BP 108/58 | HR 77 | Temp 98.3°F | Ht 71.0 in | Wt 195.0 lb

## 2016-01-11 DIAGNOSIS — J9811 Atelectasis: Secondary | ICD-10-CM | POA: Diagnosis not present

## 2016-01-11 DIAGNOSIS — R0602 Shortness of breath: Secondary | ICD-10-CM | POA: Insufficient documentation

## 2016-01-11 DIAGNOSIS — R599 Enlarged lymph nodes, unspecified: Secondary | ICD-10-CM | POA: Diagnosis not present

## 2016-01-11 DIAGNOSIS — Z951 Presence of aortocoronary bypass graft: Secondary | ICD-10-CM | POA: Insufficient documentation

## 2016-01-11 DIAGNOSIS — I889 Nonspecific lymphadenitis, unspecified: Secondary | ICD-10-CM | POA: Diagnosis not present

## 2016-01-11 DIAGNOSIS — R5383 Other fatigue: Secondary | ICD-10-CM

## 2016-01-11 DIAGNOSIS — I2510 Atherosclerotic heart disease of native coronary artery without angina pectoris: Secondary | ICD-10-CM | POA: Diagnosis not present

## 2016-01-11 DIAGNOSIS — R002 Palpitations: Secondary | ICD-10-CM

## 2016-01-11 LAB — TSH: TSH: 0.76 u[IU]/mL (ref 0.35–4.50)

## 2016-01-11 LAB — COMPREHENSIVE METABOLIC PANEL
ALT: 108 U/L — AB (ref 0–53)
AST: 114 U/L — ABNORMAL HIGH (ref 0–37)
Albumin: 3.4 g/dL — ABNORMAL LOW (ref 3.5–5.2)
Alkaline Phosphatase: 60 U/L (ref 39–117)
BUN: 30 mg/dL — AB (ref 6–23)
CO2: 27 mEq/L (ref 19–32)
Calcium: 9 mg/dL (ref 8.4–10.5)
Chloride: 98 mEq/L (ref 96–112)
Creatinine, Ser: 1.12 mg/dL (ref 0.40–1.50)
GFR: 67.86 mL/min (ref >60.00)
Glucose, Bld: 189 mg/dL — ABNORMAL HIGH (ref 70–99)
Potassium: 4.5 mEq/L (ref 3.5–5.1)
Sodium: 133 mEq/L — ABNORMAL LOW (ref 135–145)
Total Bilirubin: 0.7 mg/dL (ref 0.2–1.2)
Total Protein: 7.1 g/dL (ref 6.0–8.3)

## 2016-01-11 LAB — POCT I-STAT CREATININE: CREATININE: 1.1 mg/dL (ref 0.61–1.24)

## 2016-01-11 LAB — CBC WITH DIFFERENTIAL/PLATELET
Basophils Absolute: 0 10*3/uL (ref 0.0–0.1)
Basophils Relative: 0.2 % (ref 0.0–3.0)
Eosinophils Absolute: 0.1 10*3/uL (ref 0.0–0.7)
Eosinophils Relative: 0.6 % (ref 0.0–5.0)
HCT: 39.2 % (ref 39.0–52.0)
Hemoglobin: 13 g/dL (ref 13.0–17.0)
Lymphocytes Relative: 10.2 % — ABNORMAL LOW (ref 12.0–46.0)
Lymphs Abs: 1 10*3/uL (ref 0.7–4.0)
MCHC: 33 g/dL (ref 30.0–36.0)
MCV: 89.4 fl (ref 78.0–100.0)
Monocytes Absolute: 1.1 10*3/uL — ABNORMAL HIGH (ref 0.1–1.0)
Monocytes Relative: 10.8 % (ref 3.0–12.0)
Neutro Abs: 8 10*3/uL — ABNORMAL HIGH (ref 1.4–7.7)
Neutrophils Relative %: 78.2 % — ABNORMAL HIGH (ref 43.0–77.0)
Platelets: 316 10*3/uL (ref 150.0–400.0)
RBC: 4.39 Mil/uL (ref 4.22–5.81)
RDW: 14.2 % (ref 11.5–15.5)
WBC: 10.2 10*3/uL (ref 4.0–10.5)

## 2016-01-11 LAB — BRAIN NATRIURETIC PEPTIDE: PRO B NATRI PEPTIDE: 137 pg/mL — AB (ref 0.0–100.0)

## 2016-01-11 LAB — TROPONIN I: TNIDX: 0.02 ug/L (ref 0.00–0.06)

## 2016-01-11 LAB — D-DIMER, QUANTITATIVE: D-Dimer, Quant: 16.71 ug/mL-FEU — ABNORMAL HIGH (ref 0.00–0.48)

## 2016-01-11 MED ORDER — IOHEXOL 350 MG/ML SOLN
75.0000 mL | Freq: Once | INTRAVENOUS | Status: AC | PRN
  Administered 2016-01-11: 75 mL via INTRAVENOUS

## 2016-01-11 NOTE — Patient Instructions (Addendum)
I will call as soon as I can with the results.  Continue your current medications.  We will arrange follow up pending the results.  Take care  Dr. Lacinda Axon

## 2016-01-11 NOTE — Telephone Encounter (Signed)
ALERT: [facility] called and pt's D-Dimer is high 16.71

## 2016-01-11 NOTE — Progress Notes (Signed)
Pre visit review using our clinic review tool, if applicable. No additional management support is needed unless otherwise documented below in the visit note. 

## 2016-01-12 DIAGNOSIS — R5383 Other fatigue: Secondary | ICD-10-CM | POA: Insufficient documentation

## 2016-01-12 DIAGNOSIS — C61 Malignant neoplasm of prostate: Secondary | ICD-10-CM | POA: Insufficient documentation

## 2016-01-12 DIAGNOSIS — I889 Nonspecific lymphadenitis, unspecified: Secondary | ICD-10-CM | POA: Insufficient documentation

## 2016-01-12 DIAGNOSIS — R0602 Shortness of breath: Secondary | ICD-10-CM | POA: Insufficient documentation

## 2016-01-12 NOTE — Assessment & Plan Note (Addendum)
New problem. Chest x-ray was initially obtained. I independently reviewed the film. Interpretation: No acute findings/infiltrate.  Given venous insufficiency as well as decreased mobility postoperatively, I obtained a d-dimer which was elevated. Patient had a CTA which was negative. Patient's BNP was mildly elevated. No signs of CHF on exam. Unclear etiology. Will continue to follow.

## 2016-01-12 NOTE — Assessment & Plan Note (Signed)
Patient was recently seen by his urologist and diagnosed with lymphadenitis. Patient still has a palpable lymph node in his left inguinal region. May need to obtain CT to assess for malignancy (this was mentioned by urology).

## 2016-01-12 NOTE — Assessment & Plan Note (Signed)
New problem. Patient and generalized fatigue and malaise following surgery. Patient also has associated shortness of breath and night sweats. Laboratory work that was obtained and revealed hyponatremia, elevated AST and ALT, normal WBC count with left shift. Etiology of symptoms unclear. Will need follow up with repeat labs.

## 2016-01-12 NOTE — Progress Notes (Addendum)
Subjective:  Patient ID: [name] [name], male    DOB: 23-Apr-1941  Age: 75 y.o. MRN: [id number]  CC: Fatigue, Night sweats  HPI:  75 year old male with a past medical history of CAD status post CABG, hypertension, hyper lipidemia, DM 2, venous insufficiency status post recent surgery for a chronic nonhealing wound presents with the above complaints.  Patient states that he had surgery on his left foot last Monday for a chronic/nonhealing wound. He states that he had some postoperative pain and took some old Vicodin at home. He subsequently began to feel poorly. He states that he had trouble with his bowels as well as with urination. He also reports associated weakness. He states that he "felt like a zombie". He has since stopped the pain medication. He is now experiencing severe fatigue/weakness as well as night sweats. Additionally, he reports shortness of breath. He denies chest pain. He was seen yesterday for wound check and was told that things were healing appropriately. No known exacerbating or relieving factors regarding his symptoms. He denies any weight loss.  Social Hx   Social History   Social History  . Marital Status: Married    Spouse Name: N/A  . Number of Children: N/A  . Years of Education: 16   Occupational History  . Merchant    Social History Main Topics  . Smoking status: Never Smoker   . Smokeless tobacco: None  . Alcohol Use: Yes  . Drug Use: No  . Sexual Activity: Not Asked   Other Topics Concern  . None   Social History Narrative   Regular exercise-yes   Review of Systems  Constitutional: Positive for fatigue.       Night sweats.  Respiratory: Positive for shortness of breath.   Neurological: Positive for weakness.   Objective:  BP 108/58 mmHg  Pulse 77  Temp(Src) 98.3 F (36.8 C) (Oral)  Ht '5\' 11"'$  (1.803 m)  Wt 195 lb (88.451 kg)  BMI 27.21 kg/m2  SpO2 98%  BP/Weight 01/11/2016 06/30/2015 6/31/4970  Systolic BP 263 785 885  Diastolic BP 58  60 52  Wt. (Lbs) 195 188 183.4  BMI 27.21 26.23 25.23    Physical Exam  Constitutional: He is oriented to person, place, and time.  Appears fatigued, NAD.   HENT:  Head: Normocephalic and atraumatic.  Eyes: Conjunctivae are normal. No scleral icterus.  Cardiovascular: Normal rate and regular rhythm.   Pulmonary/Chest: Effort normal and breath sounds normal. No respiratory distress. He has no wheezes. He has no rales.  Abdominal: Soft. He exhibits no distension. There is no tenderness. There is no rebound and no guarding.  Lymphadenopathy:  Left inguinal lymphadenopathy noted.   Neurological: He is alert and oriented to person, place, and time.  Skin:  Left foot - dressing dry and intact. Wound not examined given Unna boot in place and post op check yesterday.   Psychiatric:  Flat affect.  Vitals reviewed.  Lab Results  Component Value Date   WBC 10.2 01/11/2016   HGB 13.0 01/11/2016   HCT 39.2 01/11/2016   PLT 316.0 01/11/2016   GLUCOSE 189* 01/11/2016   CHOL 110 06/30/2015   TRIG 71.0 06/30/2015   HDL 49.60 06/30/2015   LDLCALC 46 06/30/2015   ALT 108* 01/11/2016   AST 114* 01/11/2016   NA 133* 01/11/2016   K 4.5 01/11/2016   CL 98 01/11/2016   CREATININE 1.10 01/11/2016   BUN 30* 01/11/2016   CO2 27 01/11/2016   TSH 0.76  01/11/2016   HGBA1C 5.8 06/30/2015   MICROALBUR 0.7 06/30/2015   Assessment & Plan:   Problem List Items Addressed This Visit    Lymphadenitis    Patient was recently seen by his urologist and diagnosed with lymphadenitis. Patient still has a palpable lymph node in his left inguinal region. May need to obtain CT to assess for malignancy (this was mentioned by urology).       Other fatigue - Primary    New problem. Patient and generalized fatigue and malaise following surgery. Patient also has associated shortness of breath and night sweats. Laboratory work that was obtained and revealed hyponatremia, elevated AST and ALT, normal WBC count  with left shift. Etiology of symptoms unclear. Will need follow up with repeat labs.       Relevant Orders   CBC w/Diff (Completed)   Comp Met (CMET) (Completed)   TSH (Completed)   D-Dimer, Quantitative (Completed)   SOB (shortness of breath)    New problem. Chest x-ray was initially obtained. I independently reviewed the film. Interpretation: No acute findings/infiltrate.  Given venous insufficiency as well as decreased mobility postoperatively, I obtained a d-dimer which was elevated. Patient had a CTA which was negative. Patient's BNP was mildly elevated. No signs of CHF on exam. Unclear etiology. Will continue to follow.      Relevant Orders   DG Chest 2 View (Completed)   Troponin I (Completed)   B Nat Peptide (Completed)   D-Dimer, Quantitative (Completed)   CT Angio Chest W/Cm &/Or Wo Cm (Completed)     Follow-up: PRN  [place]

## 2016-01-20 ENCOUNTER — Other Ambulatory Visit: Payer: Self-pay | Admitting: Family Medicine

## 2016-01-20 ENCOUNTER — Other Ambulatory Visit (INDEPENDENT_AMBULATORY_CARE_PROVIDER_SITE_OTHER): Payer: Medicare Other

## 2016-01-20 DIAGNOSIS — R748 Abnormal levels of other serum enzymes: Secondary | ICD-10-CM

## 2016-01-20 LAB — COMPREHENSIVE METABOLIC PANEL
ALT: 37 U/L (ref 0–53)
AST: 24 U/L (ref 0–37)
Albumin: 3.1 g/dL — ABNORMAL LOW (ref 3.5–5.2)
Alkaline Phosphatase: 58 U/L (ref 39–117)
BUN: 26 mg/dL — AB (ref 6–23)
CHLORIDE: 100 meq/L (ref 96–112)
CO2: 30 mEq/L (ref 19–32)
Calcium: 8.6 mg/dL (ref 8.4–10.5)
Creatinine, Ser: 1.06 mg/dL (ref 0.40–1.50)
GFR: 72.31 mL/min (ref >60.00)
GLUCOSE: 127 mg/dL — AB (ref 70–99)
POTASSIUM: 4.6 meq/L (ref 3.5–5.1)
SODIUM: 137 meq/L (ref 135–145)
TOTAL PROTEIN: 6.2 g/dL (ref 6.0–8.3)
Total Bilirubin: 0.5 mg/dL (ref 0.2–1.2)

## 2016-01-24 ENCOUNTER — Ambulatory Visit (INDEPENDENT_AMBULATORY_CARE_PROVIDER_SITE_OTHER): Payer: Medicare Other | Admitting: Family Medicine

## 2016-01-24 ENCOUNTER — Other Ambulatory Visit: Payer: Self-pay | Admitting: Family Medicine

## 2016-01-24 ENCOUNTER — Encounter: Payer: Self-pay | Admitting: Family Medicine

## 2016-01-24 VITALS — BP 112/62 | HR 63 | Temp 98.3°F | Ht 71.0 in | Wt 192.5 lb

## 2016-01-24 DIAGNOSIS — I10 Essential (primary) hypertension: Secondary | ICD-10-CM | POA: Diagnosis not present

## 2016-01-24 DIAGNOSIS — R5383 Other fatigue: Secondary | ICD-10-CM

## 2016-01-24 DIAGNOSIS — Z794 Long term (current) use of insulin: Secondary | ICD-10-CM

## 2016-01-24 DIAGNOSIS — E785 Hyperlipidemia, unspecified: Secondary | ICD-10-CM | POA: Diagnosis not present

## 2016-01-24 DIAGNOSIS — I889 Nonspecific lymphadenitis, unspecified: Secondary | ICD-10-CM

## 2016-01-24 DIAGNOSIS — E119 Type 2 diabetes mellitus without complications: Secondary | ICD-10-CM

## 2016-01-24 LAB — LIPID PANEL
CHOL/HDL RATIO: 5
CHOLESTEROL: 139 mg/dL (ref 0–200)
HDL: 27.4 mg/dL — AB (ref >39.00)
LDL CALC: 90 mg/dL (ref 0–99)
NonHDL: 111.92
TRIGLYCERIDES: 110 mg/dL (ref 0.0–149.0)
VLDL: 22 mg/dL (ref 0.0–40.0)

## 2016-01-24 LAB — MICROALBUMIN / CREATININE URINE RATIO
CREATININE, U: 97.4 mg/dL
MICROALB UR: 1.3 mg/dL (ref 0.0–1.9)
Microalb Creat Ratio: 1.3 mg/g (ref 0.0–30.0)

## 2016-01-24 LAB — HEMOGLOBIN A1C: Hgb A1c MFr Bld: 6.4 % (ref 4.6–6.5)

## 2016-01-24 NOTE — Patient Instructions (Signed)
Follow up in 3 months.  Take care  Dr. Lacheryl Niesen  

## 2016-01-24 NOTE — Assessment & Plan Note (Signed)
Well controlled off medication. Obtaining Urine microalbumin given DM to assess for proteinuria (indication for ACE or ARB). Discussed low dose of ACE/ARB today for renal protection and given his prior renal functions he declined.

## 2016-01-24 NOTE — Assessment & Plan Note (Signed)
Patient continues to have palpable lymph nodes in the left inguinal region. Given persistence, will discuss this with his urologist and determine whether he needs a CT or potential biopsy.

## 2016-01-24 NOTE — Progress Notes (Signed)
Subjective:  Patient ID: [name] [name], male    DOB: 03-28-1941  Age: 75 y.o. MRN: [id number]  CC: Follow up   HPI:  75 year old male with a PMH of DM-2 (well controlled), HTN, HLD, CAD s/p CABG presents for follow up.  Fatigue  I recently saw the patient on 1/31. At that time he was experiencing significant fatigue and SOB following recent surgery.  Work up revealed elevated AST and ALT and elevated d-dimer. CT angiogram was performed and was negative.  He had follow-up labs with resolution of transaminitis.  He presents today for follow-up.  He states that he is much improved.  He states that he feels like his normal self.  HTN  Well controlled currently.  He is not taking any medications (he stopped these).  HLD  Previously well controlled.  He has stopped his lipitor.  Lymphadenopathy/Lymphadenitis  Patient recently seen by urology and diagnosed with lymphadenitis.  He has improved following antibiotic treatment but still has left inguinal lymphadenopathy.  No recent fever, chills.  Social Hx   Social History   Social History  . Marital Status: Married    Spouse Name: N/A  . Number of Children: N/A  . Years of Education: 16   Occupational History  . Merchant    Social History Main Topics  . Smoking status: Never Smoker   . Smokeless tobacco: None  . Alcohol Use: Yes  . Drug Use: No  . Sexual Activity: Not Asked   Other Topics Concern  . None   Social History Narrative   Regular exercise-yes   Review of Systems  Constitutional: Negative for fever and chills.  Respiratory: Negative.   Cardiovascular: Negative.     Objective:  BP 112/62 mmHg  Pulse 63  Temp(Src) 98.3 F (36.8 C) (Oral)  Ht 5\' 11"  (1.803 m)  Wt 192 lb 8 oz (87.317 kg)  BMI 26.86 kg/m2  SpO2 98%  BP/Weight 01/24/2016 01/11/2016 [id number]  Systolic BP [id number]  Diastolic BP 62 58 60  Wt. (Lbs) 192.5 195 188  BMI 26.86 27.21 26.23   Physical Exam    Constitutional: He is oriented to person, place, and time. He appears well-developed. No distress.  Eyes: No scleral icterus.  Cardiovascular: Normal rate and regular rhythm.   Pulmonary/Chest: Effort normal and breath sounds normal.  Lymphadenopathy:  Palpable left inguinal lymphadenopathy.  Neurological: He is alert and oriented to person, place, and time.  Psychiatric: He has a normal mood and affect.  Vitals reviewed.  Lab Results  Component Value Date   WBC 10.2 01/11/2016   HGB 13.0 01/11/2016   HCT 39.2 01/11/2016   PLT 316.0 01/11/2016   GLUCOSE 127* 01/20/2016   CHOL 139 01/24/2016   TRIG 110.0 01/24/2016   HDL 27.40* 01/24/2016   LDLCALC 90 01/24/2016   ALT 37 01/20/2016   AST 24 01/20/2016   NA 137 01/20/2016   K 4.6 01/20/2016   CL 100 01/20/2016   CREATININE 1.06 01/20/2016   BUN 26* 01/20/2016   CO2 30 01/20/2016   TSH 0.76 01/11/2016   HGBA1C 6.4 01/24/2016   MICROALBUR 1.3 01/24/2016    Assessment & Plan:   Problem List Items Addressed This Visit    Other fatigue    Likely from underlying viral illness. Now resolved and patient is doing well at this time.       Lymphadenitis    Patient continues to have palpable lymph nodes in the left inguinal region. Given  persistence, will discuss this with his urologist and determine whether he needs a CT or potential biopsy.      Hyperlipidemia - Primary    Unsure of control as he's not been as Lipitor. Obtaining lipid panel today.      Relevant Orders   Lipid Profile (Completed)   Essential hypertension    Well controlled off medication. Obtaining Urine microalbumin given DM to assess for proteinuria (indication for ACE or ARB). Discussed low dose of ACE/ARB today for renal protection and given his prior renal functions he declined.       Other Visit Diagnoses    Type 2 diabetes mellitus without complication, with long-term current use of insulin ([place])        Relevant Orders    HgB A1c (Completed)     Urine Microalbumin w/creat. ratio (Completed)       Meds ordered this encounter  Medications  . Insulin Syringe-Needle U-100 (BD INSULIN SYRINGE ULTRAFINE) 31G X 5/16" 0.3 ML MISC    Sig:   . glucose blood (ONE TOUCH ULTRA TEST) test strip    Sig: Reported on 01/24/2016  . Chia Oil 1000 MG CAPS    Sig: Take by mouth.  . DISCONTD: insulin lispro protamine-lispro (HUMALOG MIX 75/25) (75-25) 100 UNIT/ML SUSP injection    Sig:     Follow-up: Return in about 3 months (around 04/22/2016) for Follow up Chronic medical issues.  [place]

## 2016-01-24 NOTE — Assessment & Plan Note (Signed)
Likely from underlying viral illness. Now resolved and patient is doing well at this time.

## 2016-01-24 NOTE — Progress Notes (Signed)
Pre visit review using our clinic review tool, if applicable. No additional management support is needed unless otherwise documented below in the visit note. 

## 2016-01-24 NOTE — Assessment & Plan Note (Signed)
Unsure of control as he's not been as Lipitor. Obtaining lipid panel today.

## 2016-02-01 ENCOUNTER — Telehealth: Payer: Self-pay | Admitting: *Deleted

## 2016-02-01 MED ORDER — ATORVASTATIN CALCIUM 10 MG PO TABS: 10.0000 mg | ORAL_TABLET | Freq: Every day | ORAL | Status: DC

## 2016-02-01 MED ORDER — PRIMIDONE 50 MG PO TABS: 50.0000 mg | ORAL_TABLET | Freq: Every day | ORAL | Status: DC

## 2016-02-01 MED ORDER — VALSARTAN-HYDROCHLOROTHIAZIDE 80-12.5 MG PO TABS: 1.0000 | ORAL_TABLET | Freq: Every day | ORAL | Status: DC

## 2016-02-01 NOTE — Telephone Encounter (Signed)
Medication refill requested for generic Lipitor [facility] [address] contact [phone]

## 2016-02-01 NOTE — Telephone Encounter (Signed)
Filled

## 2016-02-01 NOTE — Telephone Encounter (Signed)
Pt's diovan-hctz 12.5mg , primadone, and lipitor filled

## 2016-02-17 ENCOUNTER — Telehealth: Payer: Self-pay

## 2016-02-17 ENCOUNTER — Ambulatory Visit (INDEPENDENT_AMBULATORY_CARE_PROVIDER_SITE_OTHER): Payer: Medicare Other | Admitting: Family Medicine

## 2016-02-17 ENCOUNTER — Encounter: Payer: Self-pay | Admitting: Family Medicine

## 2016-02-17 ENCOUNTER — Telehealth: Payer: Self-pay | Admitting: Family Medicine

## 2016-02-17 VITALS — BP 102/58 | HR 73 | Temp 98.3°F | Ht 71.0 in | Wt 190.2 lb

## 2016-02-17 DIAGNOSIS — R6889 Other general symptoms and signs: Secondary | ICD-10-CM

## 2016-02-17 DIAGNOSIS — N3000 Acute cystitis without hematuria: Secondary | ICD-10-CM | POA: Diagnosis not present

## 2016-02-17 LAB — CBC
HCT: 34.9 % — ABNORMAL LOW (ref 39.0–52.0)
Hemoglobin: 11.7 g/dL — ABNORMAL LOW (ref 13.0–17.0)
MCHC: 33.7 g/dL (ref 30.0–36.0)
MCV: 86 fl (ref 78.0–100.0)
PLATELETS: 314 10*3/uL (ref 150.0–400.0)
RBC: 4.05 Mil/uL — AB (ref 4.22–5.81)
RDW: 15.8 % — ABNORMAL HIGH (ref 11.5–15.5)
WBC: 11.6 10*3/uL — ABNORMAL HIGH (ref 4.0–10.5)

## 2016-02-17 LAB — COMPREHENSIVE METABOLIC PANEL
ALBUMIN: 3.6 g/dL (ref 3.5–5.2)
ALT: 20 U/L (ref 0–53)
AST: 30 U/L (ref 0–37)
Alkaline Phosphatase: 61 U/L (ref 39–117)
BUN: 33 mg/dL — ABNORMAL HIGH (ref 6–23)
CALCIUM: 9 mg/dL (ref 8.4–10.5)
CHLORIDE: 97 meq/L (ref 96–112)
CO2: 28 meq/L (ref 19–32)
CREATININE: 1.19 mg/dL (ref 0.40–1.50)
GFR: 63.26 mL/min (ref >60.00)
Glucose, Bld: 197 mg/dL — ABNORMAL HIGH (ref 70–99)
POTASSIUM: 4.3 meq/L (ref 3.5–5.1)
Sodium: 133 mEq/L — ABNORMAL LOW (ref 135–145)
Total Bilirubin: 0.9 mg/dL (ref 0.2–1.2)
Total Protein: 7 g/dL (ref 6.0–8.3)

## 2016-02-17 MED ORDER — CEPHALEXIN 500 MG PO CAPS: 500.0000 mg | ORAL_CAPSULE | Freq: Three times a day (TID) | ORAL | Status: DC

## 2016-02-17 NOTE — Telephone Encounter (Signed)
Reason for call: medication reaction Symptoms: chills, and tremors lasting for 15 mins last night around 10pm and 6am this morning, vomitting Duration: yesterday morning Medications:Cipro Last seen for this problem: [facility] Seen by: Urgent Care  Pt thinks he may be experiencing a reaction to Cipro, pt states that he has taken it before w/o side effects. Please advise, thanks

## 2016-02-17 NOTE — Telephone Encounter (Signed)
Pt coming in today at 2pm for an appt

## 2016-02-17 NOTE — Telephone Encounter (Signed)
Called and advised patient of lab work results. Advised that based on mildly low sodium and mildly elevated BUN he appears to be somewhat dehydrated. Advised to drink plenty of fluids. Also advised of white blood cell count appears to be mildly elevated. This could go along with infection. We have him on Keflex to treat UTI. He will continue to monitor for recurrence of symptoms and will let us know if they recur. We'll consider repeat lab work if patient continues to have symptoms.[name] [name]

## 2016-02-17 NOTE — Progress Notes (Signed)
Pre visit review using our clinic review tool, if applicable. No additional management support is needed unless otherwise documented below in the visit note. 

## 2016-02-17 NOTE — Patient Instructions (Signed)
Nice to meet you. Please stop the ciprofloxacin. We will start you on Keflex to treat your UTI. We will request records from the urgent care to get the urine culture results. We'll additionally check some blood work today. If you develop recurrent shakes, or fevers, abdominal pain, chills, testing, shortness breath, palpitations, passing out, or any new or changing symptoms please seek medical attention.

## 2016-02-17 NOTE — Progress Notes (Signed)
Patient ID: STARSKY NANNA, male   DOB: 1941-05-21, 75 y.o.   MRN: [id number]  [name] Rumps, MD Phone: [phone]  [name] [name] is a 75 y.o. male who presents today for same-day visit.  Patient reports he was evaluated yesterday at an urgent care for a UTI. He was started on Cipro and reports they sent this for urine culture. He notes last night at about 9:30 PM he developed cold chills and rigors. Notes he was shaking violently. This occurred about 4 hours after taking the Cipro. Notes this ceased to occur after 15 minutes. He notes no loss of consciousness or confusion with this. No loss of bowel or bladder function. No seizure activity. Notes this did not recur last night. Notes this morning he took the Cipro at about 6:30 AM and about an hour later he had a similar episode. He had some vomiting with this this morning. No abdominal pain or diarrhea. No fevers. No back pain. He feels well at this time. He does not have any more dysuria. He has no other neurological symptoms. He had no chest pain or shortness of breath or palpitations with this. He wonders if this could be a allergic reaction. He additionally reports he checked his blood sugar after this occurred and it was in the normal range.  PMH: nonsmoker.   ROS the history of present illness  Objective  Physical Exam Filed Vitals:   02/17/16 1403  BP: 102/58  Pulse: 73  Temp: 98.3 F (36.8 C)    BP Readings from Last 3 Encounters:  02/17/16 102/58  01/24/16 112/62  01/11/16 108/58   Wt Readings from Last 3 Encounters:  02/17/16 190 lb 3.2 oz (86.274 kg)  01/24/16 192 lb 8 oz (87.317 kg)  01/11/16 195 lb (88.451 kg)    Physical Exam  Constitutional: He is well-developed, well-nourished, and in no distress.  HENT:  Head: Normocephalic and atraumatic.  Right Ear: External ear normal.  Left Ear: External ear normal.  Mouth/Throat: Oropharynx is clear and moist. No oropharyngeal exudate.  Eyes: Conjunctivae are normal.  Pupils are equal, round, and reactive to light.  Cardiovascular: Normal rate, regular rhythm and normal heart sounds.  Exam reveals no gallop and no friction rub.   No murmur heard. Pulmonary/Chest: Effort normal and breath sounds normal. No respiratory distress. He has no wheezes. He has no rales.  Abdominal: Soft. Bowel sounds are normal. He exhibits no distension. There is no tenderness. There is no rebound and no guarding.  No CVA tenderness  Neurological: He is alert.  CN 2-12 intact, 5/5 strength in bilateral biceps, triceps, grip, quads, hamstrings, plantar and dorsiflexion, sensation to light touch intact in bilateral UE and LE, normal gait, 2+ patellar reflexes  Skin: Skin is dry. He is not diaphoretic.  Psychiatric: Affect normal.     Assessment/Plan: Please see individual problem list.  Rigors Patient's description of shaking appears consistent with rigors. Given timing of symptoms following taking Cipro it is possible that this could be an allergic reaction to the ciprofloxacin. The other concern would be potential systemic infection, though the patient appears well this time with stable vital signs and has a benign abdominal exam and no CVA tenderness. He is afebrile. Unlikely that he has pyelonephritis or other systemic cause given his appearance. He is neurologically intact so doubt neurological cause. We will have him stop the ciprofloxacin. We'll change him to Keflex and request the records from the urgent care to obtain the urine culture results.  We will additionally obtain a CBC and CMP to evaluate for other possible causes. He is given return precautions.    Orders Placed This Encounter  Procedures  . CBC  . Comp Met (CMET)    Meds ordered this encounter  Medications  . cephALEXin (KEFLEX) 500 MG capsule    Sig: Take 1 capsule (500 mg total) by mouth 3 (three) times daily.    Dispense:  21 capsule    Refill:  0     [name] Rumps, MD [place]

## 2016-02-17 NOTE — Telephone Encounter (Signed)
I would recommend evaluation for this patient. Thanks.

## 2016-02-17 NOTE — Assessment & Plan Note (Signed)
Patient's description of shaking appears consistent with rigors. Given timing of symptoms following taking Cipro it is possible that this could be an allergic reaction to the ciprofloxacin. The other concern would be potential systemic infection, though the patient appears well this time with stable vital signs and has a benign abdominal exam and no CVA tenderness. He is afebrile. Unlikely that he has pyelonephritis or other systemic cause given his appearance. He is neurologically intact so doubt neurological cause. We will have him stop the ciprofloxacin. We'll change him to Keflex and request the records from the urgent care to obtain the urine culture results. We will additionally obtain a CBC and CMP to evaluate for other possible causes. He is given return precautions.

## 2016-02-18 ENCOUNTER — Telehealth: Payer: Self-pay | Admitting: Family Medicine

## 2016-02-18 NOTE — Telephone Encounter (Signed)
Noted  

## 2016-02-18 NOTE — Telephone Encounter (Signed)
FYI

## 2016-02-18 NOTE — Telephone Encounter (Signed)
Pt would like for Dr Caryl Bis to know: Pt called stating that he had a good night last night with no episodes. Pt had a reaction to the medication of Cipro and once the medication got out of his system he was better. Call pt @ [phone]. Thank you!

## 2016-02-24 ENCOUNTER — Telehealth: Payer: Self-pay | Admitting: Family Medicine

## 2016-02-24 NOTE — Telephone Encounter (Signed)
Pt came in wanting a refill on cephALEXin (KEFLEX) 500 MG capsule.. Pt states that he feels like it has not fully gone away.. Please advise pt.. Pt also dropped off disability parking placard to be filled out.Marland Kitchen

## 2016-02-24 NOTE — Telephone Encounter (Signed)
Patient called and told placard was up front to be picked up

## 2016-02-24 NOTE — Telephone Encounter (Signed)
Please advise, whether Dr. Lacinda Axon wants this pt to make an appt or if something can be called in. As far as the hanidcap placard, i have not seen any paperwork. Please advise, thanks

## 2016-03-02 ENCOUNTER — Other Ambulatory Visit: Payer: Self-pay | Admitting: Family Medicine

## 2016-04-22 ENCOUNTER — Other Ambulatory Visit: Payer: Self-pay | Admitting: Family Medicine

## 2016-04-24 ENCOUNTER — Encounter: Payer: Self-pay | Admitting: Family Medicine

## 2016-04-24 ENCOUNTER — Ambulatory Visit (INDEPENDENT_AMBULATORY_CARE_PROVIDER_SITE_OTHER): Payer: Medicare Other | Admitting: Family Medicine

## 2016-04-24 VITALS — BP 104/58 | HR 80 | Temp 97.7°F | Ht 70.5 in | Wt 182.4 lb

## 2016-04-24 DIAGNOSIS — I10 Essential (primary) hypertension: Secondary | ICD-10-CM | POA: Diagnosis not present

## 2016-04-24 DIAGNOSIS — C799 Secondary malignant neoplasm of unspecified site: Secondary | ICD-10-CM | POA: Diagnosis not present

## 2016-04-24 DIAGNOSIS — E118 Type 2 diabetes mellitus with unspecified complications: Secondary | ICD-10-CM

## 2016-04-24 DIAGNOSIS — I2510 Atherosclerotic heart disease of native coronary artery without angina pectoris: Secondary | ICD-10-CM | POA: Diagnosis not present

## 2016-04-24 DIAGNOSIS — Z794 Long term (current) use of insulin: Secondary | ICD-10-CM

## 2016-04-24 DIAGNOSIS — E785 Hyperlipidemia, unspecified: Secondary | ICD-10-CM | POA: Diagnosis not present

## 2016-04-24 DIAGNOSIS — C439 Malignant melanoma of skin, unspecified: Secondary | ICD-10-CM

## 2016-04-24 MED ORDER — ASPIRIN 81 MG PO TABS: 81.0000 mg | ORAL_TABLET | Freq: Every day | ORAL | Status: DC

## 2016-04-24 NOTE — Patient Instructions (Signed)
Continue your current meds.  I will follow up with you via phone.  Take care & best of luck with treatment.  Dr. Lacinda Axon

## 2016-04-25 DIAGNOSIS — C439 Malignant melanoma of skin, unspecified: Secondary | ICD-10-CM | POA: Insufficient documentation

## 2016-04-25 DIAGNOSIS — C799 Secondary malignant neoplasm of unspecified site: Secondary | ICD-10-CM | POA: Insufficient documentation

## 2016-04-25 NOTE — Assessment & Plan Note (Signed)
Stable. No recent chest pain, SOB. Continue ASA, Statin, ARB.

## 2016-04-25 NOTE — Assessment & Plan Note (Signed)
Well controlled 

## 2016-04-25 NOTE — Assessment & Plan Note (Signed)
Stable. Will continue current therapy. Will consider decreasing therapy given current blood pressure.

## 2016-04-25 NOTE — Assessment & Plan Note (Signed)
Stable. Advised compliance with Lipitor.

## 2016-04-25 NOTE — Assessment & Plan Note (Signed)
New problem. Poor prognosis. Has seen oncology. Has follow-up. Has upcoming imaging for staging.

## 2016-04-25 NOTE — Progress Notes (Addendum)
Subjective:  Patient ID: [name] [name], male    DOB: [birth year]  Age: 75 y.o. MRN: [id number]  CC: Follow up chronic medical conditions, recent diagnosis of cancer  HPI:  75 year old male with a complicated past medical history including DM 2, hyperlipidemia, hypertension, CAD status post CABG, and recent diagnosis of metastatic melanoma presents for follow-up.  Metastatic melanoma  Discovered on recent imaging by his urologist following persistent inguinal lymphadenopathy.  Cystoscopy was obtained and revealed multiple tumors.  Pathology revealed melanoma.  I have spoken to his urologist about this. He was concerned about follow-up as it appeared that he canceled his appointment with oncology.  Patient reports that he's seen oncology. He is seeing an oncologist at [facility] (I have reviewed the notes).  He is scheduled for upcoming imaging for staging and then will soon begin immunotherapy.   He is in good spirits and is optimistic.  HTN  Well controlled on HCTZ/valsartan.  May need to cut back.  CAD  Stable. He imaging only takes aspirin. He endorses compliance with statin.  On ARB.  No recent chest pain or shortness of breath.  HLD  Stable.  Reports he is back on Lipitor.  DM-2  Well controlled on Insulin.  Social Hx   Social History   Social History  . Marital Status: Married    Spouse Name: N/A  . Number of Children: N/A  . Years of Education: 16   Occupational History  . Merchant    Social History Main Topics  . Smoking status: Never Smoker   . Smokeless tobacco: None  . Alcohol Use: Yes  . Drug Use: No  . Sexual Activity: Not Asked   Other Topics Concern  . None   Social History Narrative   Regular exercise-yes   Review of Systems  Respiratory: Negative.   Cardiovascular: Negative.    Objective:  BP 104/58 mmHg  Pulse 80  Temp(Src) 97.7 F (36.5 C) (Oral)  Ht 5' 10.5" (1.791 m)  Wt 182 lb 6.4 oz (82.736 kg)  BMI 25.79 kg/m2   SpO2 97%  BP/Weight 04/24/2016 02/17/2016 [id number]  Systolic BP 123456 [id number]  Diastolic BP 58 58 62  Wt. (Lbs) 182.4 190.2 192.5  BMI 25.79 26.54 26.86   Physical Exam  Constitutional: He is oriented to person, place, and time. He appears well-developed. No distress.  Cardiovascular: Normal rate and regular rhythm.   Pulmonary/Chest: Effort normal and breath sounds normal.  Neurological: He is alert and oriented to person, place, and time.  Psychiatric: He has a normal mood and affect.  Vitals reviewed.  Lab Results  Component Value Date   WBC 11.6* 02/17/2016   HGB 11.7* 02/17/2016   HCT 34.9* 02/17/2016   PLT 314.0 02/17/2016   GLUCOSE 197* 02/17/2016   CHOL 139 01/24/2016   TRIG 110.0 01/24/2016   HDL 27.40* 01/24/2016   LDLCALC 90 01/24/2016   ALT 20 02/17/2016   AST 30 02/17/2016   NA 133* 02/17/2016   K 4.3 02/17/2016   CL 97 02/17/2016   CREATININE 1.19 02/17/2016   BUN 33* 02/17/2016   CO2 28 02/17/2016   TSH 0.76 01/11/2016   HGBA1C 6.4 01/24/2016   MICROALBUR 1.3 01/24/2016   Assessment & Plan:   Problem List Items Addressed This Visit    Type 2 diabetes mellitus with complications ([place])    Well controlled.      Relevant Medications   insulin lispro protamine-lispro (HUMALOG MIX 75/25) (75-25) 100 UNIT/ML SUSP injection  aspirin 81 MG tablet   Metastatic melanoma ([place]) - Primary    New problem. Poor prognosis. Has seen oncology. Has follow-up. Has upcoming imaging for staging.       Relevant Medications   aspirin 81 MG tablet   Hyperlipidemia    Stable. Advised compliance with Lipitor.      Relevant Medications   aspirin 81 MG tablet   Essential hypertension    Stable. Will continue current therapy. Will consider decreasing therapy given current blood pressure.      Relevant Medications   aspirin 81 MG tablet   CAD (coronary artery disease)    Stable. No recent chest pain, SOB. Continue ASA, Statin, ARB.      Relevant Medications    aspirin 81 MG tablet     Meds ordered this encounter  Medications  . insulin lispro protamine-lispro (HUMALOG MIX 75/25) (75-25) 100 UNIT/ML SUSP injection    Sig:   . tamsulosin (FLOMAX) 0.4 MG CAPS capsule    Sig: Take 0.4 mg by mouth.  . ASTRAGALUS PO    Sig: Take 500 mg by mouth.  . Calcium Carbonate-Vitamin D 600-400 MG-UNIT tablet    Sig: Take 1 tablet by mouth daily.  Marland Kitchen aspirin 81 MG tablet    Sig: Take 1 tablet (81 mg total) by mouth daily.    Dispense:  30 tablet   Follow-up: Will be following up via phone soon.  [place]

## 2016-05-06 ENCOUNTER — Emergency Department
Admission: EM | Admit: 2016-05-06 | Discharge: 2016-05-06 | Disposition: A | Discharge status: Home / Self Care | Payer: Medicare Other | Source: Home / Self Care | Attending: Emergency Medicine | Admitting: Emergency Medicine

## 2016-05-06 ENCOUNTER — Emergency Department: Payer: Medicare Other

## 2016-05-06 DIAGNOSIS — Z79899 Other long term (current) drug therapy: Secondary | ICD-10-CM | POA: Insufficient documentation

## 2016-05-06 DIAGNOSIS — I10 Essential (primary) hypertension: Secondary | ICD-10-CM | POA: Insufficient documentation

## 2016-05-06 DIAGNOSIS — Z8546 Personal history of malignant neoplasm of prostate: Secondary | ICD-10-CM | POA: Insufficient documentation

## 2016-05-06 DIAGNOSIS — I2510 Atherosclerotic heart disease of native coronary artery without angina pectoris: Secondary | ICD-10-CM | POA: Insufficient documentation

## 2016-05-06 DIAGNOSIS — E119 Type 2 diabetes mellitus without complications: Secondary | ICD-10-CM | POA: Insufficient documentation

## 2016-05-06 DIAGNOSIS — R200 Anesthesia of skin: Secondary | ICD-10-CM

## 2016-05-06 DIAGNOSIS — D649 Anemia, unspecified: Secondary | ICD-10-CM | POA: Insufficient documentation

## 2016-05-06 DIAGNOSIS — Z794 Long term (current) use of insulin: Secondary | ICD-10-CM

## 2016-05-06 DIAGNOSIS — N3001 Acute cystitis with hematuria: Secondary | ICD-10-CM | POA: Diagnosis not present

## 2016-05-06 DIAGNOSIS — N390 Urinary tract infection, site not specified: Secondary | ICD-10-CM | POA: Diagnosis not present

## 2016-05-06 DIAGNOSIS — E785 Hyperlipidemia, unspecified: Secondary | ICD-10-CM

## 2016-05-06 HISTORY — DX: Malignant melanoma of skin, unspecified: C43.9

## 2016-05-06 LAB — COMPREHENSIVE METABOLIC PANEL
ALT: 35 U/L (ref 17–63)
ANION GAP: 9 (ref 5–15)
AST: 60 U/L — AB (ref 15–41)
Albumin: 2.3 g/dL — ABNORMAL LOW (ref 3.5–5.0)
Alkaline Phosphatase: 154 U/L — ABNORMAL HIGH (ref 38–126)
BILIRUBIN TOTAL: 0.9 mg/dL (ref 0.3–1.2)
BUN: 44 mg/dL — AB (ref 6–20)
CHLORIDE: 98 mmol/L — AB (ref 101–111)
CO2: 25 mmol/L (ref 22–32)
Calcium: 8.3 mg/dL — ABNORMAL LOW (ref 8.9–10.3)
Creatinine, Ser: 1.27 mg/dL — ABNORMAL HIGH (ref 0.61–1.24)
GFR calc Af Amer: >60 mL/min (ref >60)
GFR, EST NON AFRICAN AMERICAN: 54 mL/min — AB (ref >60)
GLUCOSE: 186 mg/dL — AB (ref 65–99)
POTASSIUM: 4.7 mmol/L (ref 3.5–5.1)
Sodium: 132 mmol/L — ABNORMAL LOW (ref 135–145)
TOTAL PROTEIN: 5.7 g/dL — AB (ref 6.5–8.1)

## 2016-05-06 LAB — TROPONIN I

## 2016-05-06 LAB — CBC
HEMATOCRIT: 24 % — AB (ref 40.0–52.0)
Hemoglobin: 7.9 g/dL — ABNORMAL LOW (ref 13.0–18.0)
MCH: 27.5 pg (ref 26.0–34.0)
MCHC: 33.1 g/dL (ref 32.0–36.0)
MCV: 83 fL (ref 80.0–100.0)
Platelets: 307 10*3/uL (ref 150–440)
RBC: 2.89 MIL/uL — AB (ref 4.40–5.90)
RDW: 18.4 % — ABNORMAL HIGH (ref 11.5–14.5)
WBC: 8.9 10*3/uL (ref 3.8–10.6)

## 2016-05-06 LAB — PREPARE RBC (CROSSMATCH)

## 2016-05-06 LAB — ABO/RH: ABO/RH(D): O POS

## 2016-05-06 MED ORDER — SODIUM CHLORIDE 0.9 % IV SOLN
10.0000 mL/h | Freq: Once | INTRAVENOUS | Status: AC
  Administered 2016-05-06: 10 mL/h via INTRAVENOUS

## 2016-05-06 MED ORDER — HYDROCODONE-ACETAMINOPHEN 5-325 MG PO TABS
1.0000 | ORAL_TABLET | Freq: Once | ORAL | Status: AC
  Administered 2016-05-06: 1 via ORAL
  Filled 2016-05-06: qty 1

## 2016-05-06 MED ORDER — HYDROCODONE-ACETAMINOPHEN 5-325 MG PO TABS
1.0000 | ORAL_TABLET | Freq: Once | ORAL | Status: AC
Start: 2016-05-06 — End: 2016-05-06
  Administered 2016-05-06: 1 via ORAL

## 2016-05-06 MED ORDER — HYDROCODONE-ACETAMINOPHEN 5-325 MG PO TABS
2.0000 | ORAL_TABLET | Freq: Once | ORAL | Status: DC
  Filled 2016-05-06: qty 2

## 2016-05-06 MED ORDER — MORPHINE SULFATE (PF) 4 MG/ML IV SOLN
4.0000 mg | Freq: Once | INTRAVENOUS | Status: AC
  Administered 2016-05-06: 4 mg via INTRAVENOUS
  Filled 2016-05-06: qty 1

## 2016-05-06 MED ORDER — ONDANSETRON HCL 4 MG/2ML IJ SOLN
4.0000 mg | Freq: Once | INTRAMUSCULAR | Status: AC
  Administered 2016-05-06: 4 mg via INTRAVENOUS
  Filled 2016-05-06: qty 2

## 2016-05-06 MED ORDER — SODIUM CHLORIDE 0.9 % IV SOLN
1000.0000 mL | Freq: Once | INTRAVENOUS | Status: AC
  Administered 2016-05-06: 1000 mL via INTRAVENOUS

## 2016-05-06 NOTE — ED Provider Notes (Signed)
[facility] Emergency Department Provider Note  ____________________________________________    I have reviewed the triage vital signs and the nursing notes.   HISTORY  Chief Complaint Numbness    HPI [name] [name] is a 75 y.o. male recent diagnosis of metastatic melanoma who presents with complaints of numbness to his left face which started yesterday around 5 PM. He denies motor deficits. He denies headache. He reports he had a CT scan one week ago which was apparently normal. He denies fevers or chills. No chest pain. No shortness of breath.     Past Medical History  Diagnosis Date  . Insulin dependent diabetes mellitus ([place])   . Arteriosclerotic coronary artery disease   . Peyronie's disease   . Prostatitis   . Benign essential tremor   . Hyperlipidemia   . Bladder diverticulum   . HTN (hypertension)   . Melanoma [facility])     Patient Active Problem List   Diagnosis Date Noted  . Metastatic melanoma ([place]) 04/25/2016  . Prostate cancer ([place]) 01/12/2016  . Venous insufficiency of leg 09/23/2015  . CAD (coronary artery disease) 06/30/2015  . Essential hypertension 06/30/2015  . Essential tremor 06/30/2015  . Preventative health care 06/30/2015  . Carotid artery disease ([place]) 10/02/2014  . Hyperlipidemia   . Type 2 diabetes mellitus with complications [facility])     Past Surgical History  Procedure Laterality Date  . Cholecystectomy  1973  . 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 aspirin 81 MG tablet   Oral   Take 1 tablet (81 mg total) by mouth daily.   30 tablet      . ASTRAGALUS PO   Oral   Take 500 mg by mouth.         Marland Kitchen atorvastatin (LIPITOR) 10 MG tablet   Oral   Take 1 tablet (10 mg total) by mouth daily.   90 tablet   3   . Biotin 1000 MCG tablet   Oral   Take 1,000 mcg by mouth 2 (two) times daily.           . Calcium  Carbonate-Vitamin D 600-400 MG-UNIT tablet   Oral   Take 1 tablet by mouth daily.         . Carnosine L POWD   Does not apply   by Does not apply route. 500mg   Once daily          . cholecalciferol (VITAMIN D) 1000 UNITS tablet   Oral   Take 1,000 Units by mouth daily.           . Coenzyme Q10 (EQL COQ10) 300 MG CAPS   Oral   Take 1 capsule by mouth daily.           . Cyanocobalamin (B-12) 100 MCG TABS   Oral   Take 1 tablet by mouth daily.           . Digestive Enzymes (PAPAYA ENZYME PO)   Oral   Take 1 capsule by mouth 2 (two) times daily.           . Evening Primrose Oil 1000 MG CAPS   Oral   Take 1 capsule by mouth daily.           . Ginger, Zingiber officinalis, (GINGER ROOT) 550 MG CAPS   Oral   Take 1  capsule by mouth 2 (two) times daily.           . Glucosamine-Chondroit-Vit C-Mn (GLUCOSAMINE 1500 COMPLEX) CAPS   Oral   Take 1 capsule by mouth daily.           . Glutamine 500 MG CAPS   Oral   Take 1 capsule by mouth daily.           . Glutathione-L POWD      250mg   Once daily          . insulin lispro protamine-lispro (HUMALOG MIX 75/25) (75-25) 100 UNIT/ML SUSP injection               . Insulin Syringe-Needle U-100 (INSULIN SYRINGE .3CC/31GX5/16") 31G X 5/16" 0.3 ML MISC      USE 1 SYRINGE TWICE DAILY   100 each   3   . Krill Oil 300 MG CAPS   Oral   Take 1 capsule by mouth 2 (two) times daily.           . Lancets (ONETOUCH ULTRASOFT) lancets      Use as instructed   100 each   12   . Magnesium 400 MG CAPS   Oral   Take 1 capsule by mouth 2 (two) times daily.           . milk thistle 175 MG tablet   Oral   Take 175 mg by mouth daily.           . NON FORMULARY      Tumeric  500mg  1 by mouth daily          . primidone (MYSOLINE) 50 MG tablet   Oral   Take 1 tablet (50 mg total) by mouth daily. 25 mg by mouth daily   90 tablet   3   . tamsulosin (FLOMAX) 0.4 MG CAPS capsule   Oral   Take  0.4 mg by mouth.         . valsartan-hydrochlorothiazide (DIOVAN HCT) 80-12.5 MG tablet   Oral   Take 1 tablet by mouth daily.   90 tablet   3     Allergies Ciprofloxacin and Codeine  Family History  Problem Relation Age of Onset  . Arthritis Mother   . Heart disease Mother   . Heart attack Mother   . Diabetes Sister   . Heart disease Brother   . Diabetes Brother     Social History Social History  Substance Use Topics  . Smoking status: Never Smoker   . Smokeless tobacco: None  . Alcohol Use: Yes    Review of Systems  Constitutional: Negative for fever. Eyes: Negative forVisual changes ENT: Negative for Neck pain Cardiovascular: Negative for chest pain Respiratory: Negative for shortness of breath. Gastrointestinal: Negative for nausea Genitourinary: Negative for dysuria. Musculoskeletal: Negative for back pain. Skin: Negative for rash. Neurological: Negative for focal weakness, decreased sensation left face as above Psychiatric: no anxiety    ____________________________________________   PHYSICAL EXAM:  VITAL SIGNS: ED Triage Vitals  Enc Vitals Group     BP 05/06/16 1146 109/53 mmHg     Pulse Rate 05/06/16 1146 84     Resp 05/06/16 1146 16     Temp 05/06/16 1141 97.7 F (36.5 C)     Temp Source 05/06/16 1141 Oral     SpO2 05/06/16 1146 98 %     Weight --      Height --      Head Cir --  Peak Flow --      Pain Score --      Pain Loc --      Pain Edu? --      Excl. in [place]? --      Constitutional: Alert and oriented. Well appearing and in no distress.  Eyes: Conjunctivae are normal. No erythema or injection ENT   Head: Normocephalic and atraumatic.   Mouth/Throat: Mucous membranes are moist. Cardiovascular: Normal rate, regular rhythm. Normal and symmetric distal pulses are present in the upper extremities.  Respiratory: Normal respiratory effort without tachypnea nor retractions. Breath sounds are clear and equal bilaterally.   Gastrointestinal: Soft and non-tender in all quadrants. No distention. There is no CVA tenderness.Brown stool, guaiac negative Genitourinary: deferred Musculoskeletal: Nontender with normal range of motion in all extremities. No lower extremity tenderness nor edema. Neurologic:  Normal speech and language. Cranial nerves II through XII are normal, EOMI [place], no dysdiadochokinesis, normal strength in all extremities Skin:  Skin is warm, dry and intact. No rash noted. Psychiatric: Mood and affect are normal. Patient exhibits appropriate insight and judgment.  ____________________________________________    LABS (pertinent positives/negatives)  Labs Reviewed  CBC  COMPREHENSIVE METABOLIC PANEL  TROPONIN I    ____________________________________________   EKG  ED ECG REPORT I, Lavonia Drafts, the attending physician, personally viewed and interpreted this ECG.  Date: 05/06/2016 EKG Time: 12 PM Rate: 84 Rhythm: normal sinus rhythm QRS Axis: normal Intervals: normal ST/T Wave abnormalities: normal Conduction Disturbances: none Narrative Interpretation: unremarkable   ____________________________________________    RADIOLOGY  CT head unremarkable  ____________________________________________   PROCEDURES  Procedure(s) performed: none  Critical Care performed: none  ____________________________________________   INITIAL IMPRESSION / ASSESSMENT AND PLAN / ED COURSE  Pertinent labs & imaging results that were available during my care of the patient were reviewed by me and considered in my medical decision making (see chart for details).  Patient with recent diagnosis of metastatic melanoma presents with a new left facial sensory deficit, primarily centered around the left lower lip. No facial droop, cranial nerves intact. We will send for repeat CT scan and discuss with his oncologist.  ----------------------------------------- 1:00 PM on  05/06/2016 -----------------------------------------  CT head is unremarkable. Mildly elevated BUN and creatinine, we will provide IV fluids. Hemoglobin has dropped to 7.9. Brown stool, guaiac negative  Discussed with his oncologist at [facility], Dr. Raynelle Chary, she feels anemia likely related to bone marrow involvement, asks if we can transfuse him given the holiday weekend. I feel this is reasonable given that he is symptomatic with weakness and low energy.  ____________________________________________   FINAL CLINICAL IMPRESSION(S) / ED DIAGNOSES  Final diagnoses:  Anemia, unspecified anemia type  Left facial numbness          Lavonia Drafts, MD 05/06/16 1825

## 2016-05-06 NOTE — Discharge Instructions (Signed)
 Blood Transfusion  A blood transfusion is a procedure in which you receive donated blood through an IV tube. You may need a blood transfusion because of illness, surgery, or injury. The blood may come from a donor, or it may be your own blood that you donated previously. The blood given in a transfusion is made up of different types of cells. You may receive:  Red blood cells. These carry oxygen and replace lost blood.  Platelets. These control bleeding.  Plasma. Thishelps blood to clot. If you have hemophilia or another clotting disorder, you may also receive other types of blood products. LET YOUR HEALTH CARE PROVIDER KNOW ABOUT:  Any allergies you have.  All medicines you are taking, including vitamins, herbs, eye drops, creams, and over-the-counter medicines.  Previous problems you or members of your family have had with the use of anesthetics.  Any blood disorders you have.  Previous surgeries you have had.  Any medical conditions you may have.  Any previous reactions you have had during a blood transfusion.  RISKS AND COMPLICATIONS Generally, this is a safe procedure. However, problems may occur, including:  Having an allergic reaction to something in the donated blood.  Fever. This may be a reaction to the white blood cells in the transfused blood.  Iron overload. This can happen from having many transfusions.  Transfusion-related acute lung injury (TRALI). This is a rare reaction that causes lung damage. The cause is not known.TRALI can occur within hours of a transfusion or several days later.  Sudden (acute) or delayed hemolytic reactions. This happens if your blood does not match the cells in your transfusion. Your body's defense system (immune system) may try to attack the new cells. This complication is rare.  Infection. This is rare. BEFORE THE PROCEDURE  You may have a blood test to determine your blood type. This is necessary to know what kind of blood  your body will accept.  If you are going to have a planned surgery, you may donate your own blood. This may be done in case you need to have a transfusion.  If you have had an allergic reaction to a transfusion in the past, you may be given medicine to help prevent a reaction. Take this medicine only as directed by your health care provider.  You will have your temperature, blood pressure, and pulse monitored before the transfusion. PROCEDURE   An IV will be started in your hand or arm.  The bag of donated blood will be attached to your IV tube and given into your vein.  Your temperature, blood pressure, and pulse will be monitored regularly during the transfusion. This monitoring is done to detect early signs of a transfusion reaction.  If you have any signs or symptoms of a reaction, your transfusion will be stopped and you may be given medicine.  When the transfusion is over, your IV will be removed.  Pressure may be applied to the IV site for a few minutes.  A bandage (dressing) will be applied. The procedure may vary among health care providers and hospitals. AFTER THE PROCEDURE  Your blood pressure, temperature, and pulse will be monitored regularly.   This information is not intended to replace advice given to you by your health care provider. Make sure you discuss any questions you have with your health care provider.   Document Released: 11/24/2000 Document Revised: 12/18/2014 Document Reviewed: 10/07/2014 Elsevier Interactive Patient Education 2016 Else[employer] Inc[employer]   Anemia, Nonspecific Anemia is a   condition in which the concentration of red blood cells or hemoglobin in the blood is below normal. Hemoglobin is a substance in red blood cells that carries oxygen to the tissues of the body. Anemia results in not enough oxygen reaching these tissues.  CAUSES  Common causes of anemia include:   Excessive bleeding. Bleeding may be internal or external. This includes excessive  bleeding from periods (in women) or from the intestine.   Poor nutrition.   Chronic kidney, thyroid, and liver disease.  Bone marrow disorders that decrease red blood cell production.  Cancer and treatments for cancer.  HIV, AIDS, and their treatments.  Spleen problems that increase red blood cell destruction.  Blood disorders.  Excess destruction of red blood cells due to infection, medicines, and autoimmune disorders. SIGNS AND SYMPTOMS   Minor weakness.   Dizziness.   Headache.  Palpitations.   Shortness of breath, especially with exercise.   Paleness.  Cold sensitivity.  Indigestion.  Nausea.  Difficulty sleeping.  Difficulty concentrating. Symptoms may occur suddenly or they may develop slowly.  DIAGNOSIS  Additional blood tests are often needed. These help your health care provider determine the best treatment. Your health care provider will check your stool for blood and look for other causes of blood loss.  TREATMENT  Treatment varies depending on the cause of the anemia. Treatment can include:   Supplements of iron, vitamin B12, or folic acid.   Hormone medicines.   A blood transfusion. This may be needed if blood loss is severe.   Hospitalization. This may be needed if there is significant continual blood loss.   Dietary changes.  Spleen removal. HOME CARE INSTRUCTIONS Keep all follow-up appointments. It often takes many weeks to correct anemia, and having your health care provider check on your condition and your response to treatment is very important. SEEK IMMEDIATE MEDICAL CARE IF:   You develop extreme weakness, shortness of breath, or chest pain.   You become dizzy or have trouble concentrating.  You develop heavy vaginal bleeding.   You develop a rash.   You have bloody or black, tarry stools.   You faint.   You vomit up blood.   You vomit repeatedly.   You have abdominal pain.  You have a fever or  persistent symptoms for more than 2-3 days.   You have a fever and your symptoms suddenly get worse.   You are dehydrated.  MAKE SURE YOU:  Understand these instructions.  Will watch your condition.  Will get help right away if you are not doing well or get worse.   This information is not intended to replace advice given to you by your health care provider. Make sure you discuss any questions you have with your health care provider.   Document Released: 01/04/2005 Document Revised: 07/30/2013 Document Reviewed: 05/23/2013 Elsevier Interactive Patient Education 2016 Elsevier Inc.  

## 2016-05-06 NOTE — ED Notes (Addendum)
Pt came to ED from home c/o left sided numbness of face. Denies any radiation to any other area. 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

## 2016-05-06 NOTE — ED Notes (Signed)
Pt verbalized understanding of discharge instructions. NAD at this time. 

## 2016-05-07 ENCOUNTER — Inpatient Hospital Stay
Admission: EM | Admit: 2016-05-07 | Discharge: 2016-05-10 | DRG: 690 | Disposition: A | Discharge status: Home Health | Payer: Medicare Other | Attending: Internal Medicine | Admitting: Internal Medicine

## 2016-05-07 ENCOUNTER — Emergency Department: Payer: Medicare Other

## 2016-05-07 ENCOUNTER — Encounter: Payer: Self-pay | Admitting: Emergency Medicine

## 2016-05-07 DIAGNOSIS — Z833 Family history of diabetes mellitus: Secondary | ICD-10-CM

## 2016-05-07 DIAGNOSIS — N3001 Acute cystitis with hematuria: Principal | ICD-10-CM | POA: Diagnosis present

## 2016-05-07 DIAGNOSIS — B001 Herpesviral vesicular dermatitis: Secondary | ICD-10-CM | POA: Diagnosis present

## 2016-05-07 DIAGNOSIS — Z9049 Acquired absence of other specified parts of digestive tract: Secondary | ICD-10-CM | POA: Diagnosis not present

## 2016-05-07 DIAGNOSIS — C439 Malignant melanoma of skin, unspecified: Secondary | ICD-10-CM

## 2016-05-07 DIAGNOSIS — N390 Urinary tract infection, site not specified: Secondary | ICD-10-CM | POA: Diagnosis present

## 2016-05-07 DIAGNOSIS — I2510 Atherosclerotic heart disease of native coronary artery without angina pectoris: Secondary | ICD-10-CM | POA: Diagnosis present

## 2016-05-07 DIAGNOSIS — Z794 Long term (current) use of insulin: Secondary | ICD-10-CM | POA: Diagnosis not present

## 2016-05-07 DIAGNOSIS — Z8249 Family history of ischemic heart disease and other diseases of the circulatory system: Secondary | ICD-10-CM

## 2016-05-07 DIAGNOSIS — Z8261 Family history of arthritis: Secondary | ICD-10-CM

## 2016-05-07 DIAGNOSIS — I959 Hypotension, unspecified: Secondary | ICD-10-CM | POA: Diagnosis present

## 2016-05-07 DIAGNOSIS — E11621 Type 2 diabetes mellitus with foot ulcer: Secondary | ICD-10-CM | POA: Diagnosis present

## 2016-05-07 DIAGNOSIS — C799 Secondary malignant neoplasm of unspecified site: Secondary | ICD-10-CM | POA: Diagnosis present

## 2016-05-07 DIAGNOSIS — I10 Essential (primary) hypertension: Secondary | ICD-10-CM | POA: Diagnosis present

## 2016-05-07 DIAGNOSIS — Z885 Allergy status to narcotic agent status: Secondary | ICD-10-CM

## 2016-05-07 DIAGNOSIS — Z79899 Other long term (current) drug therapy: Secondary | ICD-10-CM | POA: Diagnosis not present

## 2016-05-07 DIAGNOSIS — D638 Anemia in other chronic diseases classified elsewhere: Secondary | ICD-10-CM | POA: Diagnosis present

## 2016-05-07 DIAGNOSIS — E119 Type 2 diabetes mellitus without complications: Secondary | ICD-10-CM

## 2016-05-07 DIAGNOSIS — Z951 Presence of aortocoronary bypass graft: Secondary | ICD-10-CM

## 2016-05-07 DIAGNOSIS — N400 Enlarged prostate without lower urinary tract symptoms: Secondary | ICD-10-CM | POA: Diagnosis present

## 2016-05-07 DIAGNOSIS — C779 Secondary and unspecified malignant neoplasm of lymph node, unspecified: Secondary | ICD-10-CM | POA: Diagnosis present

## 2016-05-07 DIAGNOSIS — Z7982 Long term (current) use of aspirin: Secondary | ICD-10-CM

## 2016-05-07 DIAGNOSIS — C679 Malignant neoplasm of bladder, unspecified: Secondary | ICD-10-CM | POA: Diagnosis present

## 2016-05-07 DIAGNOSIS — E860 Dehydration: Secondary | ICD-10-CM | POA: Diagnosis present

## 2016-05-07 DIAGNOSIS — E785 Hyperlipidemia, unspecified: Secondary | ICD-10-CM | POA: Diagnosis present

## 2016-05-07 DIAGNOSIS — E871 Hypo-osmolality and hyponatremia: Secondary | ICD-10-CM | POA: Diagnosis present

## 2016-05-07 DIAGNOSIS — L97421 Non-pressure chronic ulcer of left heel and midfoot limited to breakdown of skin: Secondary | ICD-10-CM | POA: Diagnosis present

## 2016-05-07 DIAGNOSIS — G250 Essential tremor: Secondary | ICD-10-CM | POA: Diagnosis present

## 2016-05-07 DIAGNOSIS — Z881 Allergy status to other antibiotic agents status: Secondary | ICD-10-CM

## 2016-05-07 DIAGNOSIS — R531 Weakness: Secondary | ICD-10-CM

## 2016-05-07 LAB — COMPREHENSIVE METABOLIC PANEL
ALT: 44 U/L (ref 17–63)
AST: 75 U/L — AB (ref 15–41)
Albumin: 2.2 g/dL — ABNORMAL LOW (ref 3.5–5.0)
Alkaline Phosphatase: 197 U/L — ABNORMAL HIGH (ref 38–126)
Anion gap: 10 (ref 5–15)
BUN: 44 mg/dL — AB (ref 6–20)
CHLORIDE: 98 mmol/L — AB (ref 101–111)
CO2: 22 mmol/L (ref 22–32)
Calcium: 8.4 mg/dL — ABNORMAL LOW (ref 8.9–10.3)
Creatinine, Ser: 1.31 mg/dL — ABNORMAL HIGH (ref 0.61–1.24)
GFR calc Af Amer: 60 mL/min — ABNORMAL LOW (ref >60)
GFR, EST NON AFRICAN AMERICAN: 52 mL/min — AB (ref >60)
Glucose, Bld: 240 mg/dL — ABNORMAL HIGH (ref 65–99)
POTASSIUM: 4.6 mmol/L (ref 3.5–5.1)
SODIUM: 130 mmol/L — AB (ref 135–145)
Total Bilirubin: 1 mg/dL (ref 0.3–1.2)
Total Protein: 5.7 g/dL — ABNORMAL LOW (ref 6.5–8.1)

## 2016-05-07 LAB — URINALYSIS COMPLETE WITH MICROSCOPIC (ARMC ONLY)
BILIRUBIN URINE: NEGATIVE
Glucose, UA: 50 mg/dL — AB
KETONES UR: NEGATIVE mg/dL
NITRITE: NEGATIVE
PH: 5 (ref 5.0–8.0)
Protein, ur: 30 mg/dL — AB
Specific Gravity, Urine: 1.016 (ref 1.005–1.030)

## 2016-05-07 LAB — CBC WITH DIFFERENTIAL/PLATELET
BASOS ABS: 0.1 10*3/uL (ref 0–0.1)
Basophils Relative: 1 %
EOS ABS: 0 10*3/uL (ref 0–0.7)
HCT: 27.3 % — ABNORMAL LOW (ref 40.0–52.0)
Hemoglobin: 9.3 g/dL — ABNORMAL LOW (ref 13.0–18.0)
Lymphs Abs: 0.8 10*3/uL — ABNORMAL LOW (ref 1.0–3.6)
MCH: 28.7 pg (ref 26.0–34.0)
MCHC: 33.9 g/dL (ref 32.0–36.0)
MCV: 84.6 fL (ref 80.0–100.0)
Monocytes Absolute: 1 10*3/uL (ref 0.2–1.0)
Monocytes Relative: 9 %
Neutro Abs: 8.5 10*3/uL — ABNORMAL HIGH (ref 1.4–6.5)
Neutrophils Relative %: 83 %
PLATELETS: 313 10*3/uL (ref 150–440)
RBC: 3.23 MIL/uL — AB (ref 4.40–5.90)
RDW: 18.8 % — ABNORMAL HIGH (ref 11.5–14.5)
WBC: 10.3 10*3/uL (ref 3.8–10.6)

## 2016-05-07 LAB — TYPE AND SCREEN
ABO/RH(D): O POS
ANTIBODY SCREEN: NEGATIVE
UNIT DIVISION: 0

## 2016-05-07 LAB — ACETAMINOPHEN LEVEL

## 2016-05-07 LAB — ETHANOL

## 2016-05-07 LAB — SALICYLATE LEVEL: Salicylate Lvl: <4 mg/dL (ref 2.8–30.0)

## 2016-05-07 LAB — LIPASE, BLOOD: LIPASE: 14 U/L (ref 11–51)

## 2016-05-07 LAB — TROPONIN I

## 2016-05-07 LAB — AMMONIA: AMMONIA: 19 umol/L (ref 9–35)

## 2016-05-07 MED ORDER — SODIUM CHLORIDE 0.9 % IV BOLUS (SEPSIS)
1000.0000 mL | Freq: Once | INTRAVENOUS | Status: AC
  Administered 2016-05-07: 1000 mL via INTRAVENOUS

## 2016-05-07 MED ORDER — DEXTROSE 5 % IV SOLN
1.0000 g | Freq: Once | INTRAVENOUS | Status: AC
  Administered 2016-05-07: 1 g via INTRAVENOUS
  Filled 2016-05-07: qty 10

## 2016-05-07 NOTE — H&P (Signed)
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 He is scheduled to start immunotherapy and radiation therapy at [facility] on Wednesday. Since his diagnosis is been getting weaker, his appetite is decreasing as well as his strength. Today he was weak he could not get out of bed, he was too weak to walk. He has essential tremors most in the right han, today got worse. There is no confusion. He had chills but no fevers. He's had no nausea, vomiting or worsening abdominal pain. He does have a baseline abdominal discomfort. He denies burning urination. He came to the ER yesterday because of weakness he was found to be anemic and was transfused 1 unit of packed red blood cell. He felt better and was discharged home. His weakness recurred  today. Today in the ER the patient was diagnosed with a urinary tract infection. Of note the patient has a history of recurrent urinary tract infection. He has a kidney stone and is on prophylactic doxycycline 100 mg daily.  Review of Systems:  The patient denies anorexia, fever, weight loss,, vision loss, decreased hearing, hoarseness, chest pain, syncope, dyspnea on exertion, peripheral edema, balance deficits, hemoptysis, abdominal pain, melena, hematochezia, severe indigestion/heartburn, hematuria, incontinence, genital sores, muscle weakness, suspicious skin lesions, transient blindness, difficulty walking, depression, unusual weight change, abnormal bleeding, enlarged lymph nodes, angioedema, and breast masses.  Past Medical History: Past Medical History  Diagnosis Date  . Insulin dependent diabetes mellitus ([place])   . Arteriosclerotic coronary artery disease   . Peyronie's disease   . Prostatitis   . Benign essential tremor   . Hyperlipidemia   . Bladder diverticulum   . HTN (hypertension)   . Melanoma [facility])    Past Surgical History   Procedure Laterality Date  . Cholecystectomy  1973  . Coronary artery bypass graft  04/2010     x6, Done at [facility]    Medications: Prior to Admission medications   Medication Sig Start Date End Date Taking? Authorizing Provider  aspirin 81 MG tablet Take 1 tablet (81 mg total) by mouth daily. 04/24/16  Yes Jayce G Cook, DO  ASTRAGALUS PO Take 500 mg by mouth daily.    Yes Historical Provider, MD  atorvastatin (LIPITOR) 10 MG tablet Take 1 tablet (10 mg total) by mouth daily. 02/01/16  Yes Coral Spikes, DO  Biotin 1000 MCG tablet Take 1,000 mcg by mouth 2 (two) times daily.     Yes Historical Provider, MD  Calcium Carbonate-Vitamin D 600-400 MG-UNIT tablet Take 1 tablet by mouth daily.   Yes Historical Provider, MD  Carnosine L POWD by Does not apply route. 500mg   Once daily    Yes Historical Provider, MD  cholecalciferol (VITAMIN D) 1000 UNITS tablet Take 1,000 Units by mouth daily.     Yes Historical Provider, MD  Coenzyme Q10 (EQL COQ10) 300 MG CAPS Take 1 capsule by mouth daily.     Yes Historical Provider, MD  Cyanocobalamin (B-12) 100 MCG TABS Take 1 tablet by mouth daily.     Yes Historical Provider, MD  Digestive Enzymes (PAPAYA ENZYME PO) Take 1 capsule by mouth 2 (two) times daily.     Yes Historical Provider, MD  Evening Primrose Oil 1000 MG CAPS Take 1 capsule by mouth daily.     Yes Historical Provider, MD  Ginger, Zingiber officinalis, (GINGER ROOT) 550  MG CAPS Take 1 capsule by mouth 2 (two) times daily.     Yes Historical Provider, MD  Glucosamine-Chondroit-Vit C-Mn (GLUCOSAMINE 1500 COMPLEX) CAPS Take 1 capsule by mouth daily.     Yes Historical Provider, MD  Glutamine 500 MG CAPS Take 1 capsule by mouth daily.     Yes Historical Provider, MD  Glutathione-L POWD Take 250 mg by mouth daily. 250mg   Once daily   Yes Historical Provider, MD  insulin lispro protamine-lispro (HUMALOG MIX 75/25) (75-25) 100 UNIT/ML SUSP injection Inject 30-35 Units into the skin 2 (two) times daily  with a meal.  02/04/16  Yes Historical Provider, MD  Insulin Syringe-Needle U-100 (INSULIN SYRINGE .3CC/31GX5/16") 31G X 5/16" 0.3 ML MISC USE 1 SYRINGE TWICE DAILY 04/24/16  Yes Coral Spikes, DO  Krill Oil 300 MG CAPS Take 1 capsule by mouth 2 (two) times daily.     Yes Historical Provider, MD  Lancets [facility] ULTRASOFT) lancets Use as instructed 01/04/16  Yes Coral Spikes, DO  Magnesium 400 MG CAPS Take 1 capsule by mouth 2 (two) times daily.     Yes Historical Provider, MD  milk thistle 175 MG tablet Take 175 mg by mouth daily.     Yes Historical Provider, MD  NON FORMULARY Tumeric  500mg  1 by mouth daily    Yes Historical Provider, MD  primidone (MYSOLINE) 50 MG tablet Take 1 tablet (50 mg total) by mouth daily. 25 mg by mouth daily 02/01/16  Yes Coral Spikes, DO  tamsulosin (FLOMAX) 0.4 MG CAPS capsule Take 0.4 mg by mouth daily.  03/29/16  Yes Historical Provider, MD  valsartan-hydrochlorothiazide (DIOVAN HCT) 80-12.5 MG tablet Take 1 tablet by mouth daily. 02/01/16  Yes Coral Spikes, DO    Allergies:   Allergies  Allergen Reactions  . Ciprofloxacin Other (See Comments)    Rigors  . Codeine     Social History:  reports that he has never smoked. He does not have any smokeless tobacco history on file. He reports that he drinks alcohol. He reports that he does not use illicit drugs.  Family History: Family History  Problem Relation Age of Onset  . Arthritis Mother   . Heart disease Mother   . Heart attack Mother   . Diabetes Sister   . Heart disease Brother   . Diabetes Brother     Physical Exam: Filed Vitals:   05/07/16 2130 05/07/16 2230 05/07/16 2300 05/07/16 2330  BP: 109/55 111/51 108/56 112/56  Pulse: 91 90 88 89  Temp:      TempSrc:      Resp: 25 23 24 25   Height:      Weight:      SpO2: 96% 96% 95% 96%    General:  Alert and oriented times three, well developed and nourished, no acute distress Eyes: PERRLA, pink conjunctiva, no scleral icterus ENT: Moist  oral mucosa, neck supple, no thyromegaly Lungs: clear to ascultation, no wheeze, no crackles, no use of accessory muscles Cardiovascular: regular rate and rhythm, no regurgitation, no gallops, no murmurs. No carotid bruits, no JVD Abdomen: soft, positive BS, Generalized nonspecific tenderness to palpation, non-distended, no organomegaly, not an acute abdomen GU: not examined Neuro: CN II - XII grossly intact, sensation intact Musculoskeletal: strength 5/5 all extremities, no clubbing, cyanosis or edema Skin: no rash, no subcutaneous crepitation, no decubitus Psych: appropriate patient   Labs on Admission:   Recent Labs  05/06/16 1156 05/07/16 2115  NA 132* 130*  K 4.7 4.6  CL 98* 98*  CO2 25 22  GLUCOSE 186* 240*  BUN 44* 44*  CREATININE 1.27* 1.31*  CALCIUM 8.3* 8.4*    Recent Labs  05/06/16 1156 05/07/16 2115  AST 60* 75*  ALT 35 44  ALKPHOS 154* 197*  BILITOT 0.9 1.0  PROT 5.7* 5.7*  ALBUMIN 2.3* 2.2*    Recent Labs  05/07/16 2115  LIPASE 14    Recent Labs  05/06/16 1156 05/07/16 2115  WBC 8.9 10.3  NEUTROABS  --  8.5*  HGB 7.9* 9.3*  HCT 24.0* 27.3*  MCV 83.0 84.6  PLT 307 313    Recent Labs  05/06/16 1156 05/07/16 2115  TROPONINI <0.03 <0.03   Invalid input(s): POCBNP No results for input(s): DDIMER in the last 72 hours. No results for input(s): HGBA1C in the last 72 hours. No results for input(s): CHOL, HDL, LDLCALC, TRIG, CHOLHDL, LDLDIRECT in the last 72 hours. No results for input(s): TSH, T4TOTAL, T3FREE, THYROIDAB in the last 72 hours.  Invalid input(s): FREET3 No results for input(s): VITAMINB12, FOLATE, FERRITIN, TIBC, IRON, RETICCTPCT in the last 72 hours.  Micro Results: No results found for this or any previous visit (from the past 240 hour(s)).   Radiological Exams on Admission: Dg Chest 2 View  05/07/2016  CLINICAL DATA:  Acute onset worsening generalized weakness and difficulty speaking. Initial encounter. EXAM: CHEST  2  VIEW COMPARISON:  Chest radiograph and CTA of the chest performed 01/11/2016 FINDINGS: The lungs are well-aerated. Mild bilateral atelectasis is noted. Small bilateral pleural effusions are seen. There is no evidence of pneumothorax. Left lateral pleural thickening is noted, of uncertain significance. The heart is normal in size; the patient is status post median sternotomy, with evidence of prior CABG. No acute osseous abnormalities are seen. IMPRESSION: Mild bilateral atelectasis noted. Small bilateral pleural effusions seen. Left lateral pleural thickening, of uncertain significance. Electronically Signed   By: Garald Balding M.D.   On: 05/07/2016 22:07   Ct Head Wo Contrast  05/06/2016  CLINICAL DATA:  Acute left-sided facial numbness. EXAM: CT HEAD WITHOUT CONTRAST TECHNIQUE: Contiguous axial images were obtained from the base of the skull through the vertex without intravenous contrast. COMPARISON:  None. FINDINGS: Bony calvarium appears intact. Mild diffuse cortical atrophy is noted. Mild chronic ischemic white matter disease is noted. 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 No mass effect or midline shift is noted. Ventricular size is within normal limits. There is no evidence of mass lesion, hemorrhage or acute infarction. IMPRESSION: Mild diffuse cortical atrophy. Mild chronic ischemic white matter disease. No acute intracranial abnormality seen. Electronically Signed   By: Marijo Conception, M.D.   On: 05/06/2016 12:40    Assessment/Plan Present on Admission:  . UTI (lower urinary tract infection) -Admit to MedSurg -Urine cultures collected and results pending -IV Rocephin daily. D/C prophylaxis doxycycline  Nephrolithiasis -Likely cause of patient's recurrent urinary tract infections,. -Treatment Per patient's urologist Eloisa Northern  Diabetes mellitus type 2 -ADA diet and sliding scale insulin  Anemia -Multifactorial due to patient's many illnesses. Status post  outpatient transfusion yesterday  . Metastatic melanoma (HCC)/bladder cancer - [place] patient to begin immunotherapy/radiation therapy on Wednesday at [facility]. Family informed the need to update [facility] on the patient's current status.  . Essential hypertension -Stable, resume home medications  . Essential tremor -Stable, resume home medications  . CAD (coronary artery disease) -stable, resume home medications  [name] [name] 05/07/2016, 11:35 PM

## 2016-05-07 NOTE — ED Notes (Signed)
Pt assisted into wheelchair from car by this nurse and pt's son-in-law; pt presents tonight with weakness; seen here last night, given a blood transfusion and discharged home; pt has stage 4 melanoma and is scheduled to start treatment next week at [facility]

## 2016-05-07 NOTE — ED Provider Notes (Addendum)
[facility] Emergency Department Provider Note  ____________________________________________   I have reviewed the triage vital signs and the nursing notes.   HISTORY  Chief Complaint Weakness    HPI [name] [name] is a 75 y.o. male presents today with weakness. Patient unfortunately recently diagnosed with metastatic melanoma not currently getting chemotherapy or radiation. There is also to be some bone marrow involvement and the patient has a history of anemia. He was guaiac negative yesterday. He did receive a tense fusion yesterday. He states he went home feeling somewhat better, but then over the last 24 hours she has begun to get even more weak than he was before. He denies any focal numbness or weakness. He did not hit his head. He tried to get off the couch and was too weak to stand up. For that reason he was brought into the emergency department. He has had no fever no chills no nausea no vomiting. 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 He has had no abdominal pain or vomiting. He denies any melena bright red blood per rectum or vomiting. He is not eating or drinking at all apparently according to family. He denies any stiff neck or headache. He states he has an occasional dry cough but nothing significant with no production. His only complaint really is that he feels generally weak. He does state that he has on chronic antibiotics because he has had recurrent urinary tract infections that he has no symptoms of UTI at this time     Past Medical History  Diagnosis Date  . Insulin dependent diabetes mellitus ([place])   . Arteriosclerotic coronary artery disease   . Peyronie's disease   . Prostatitis   . Benign essential tremor   . Hyperlipidemia   . Bladder diverticulum   . HTN (hypertension)   . Melanoma [facility])     Patient Active Problem List   Diagnosis Date Noted  . Metastatic melanoma ([place])  04/25/2016  . Prostate cancer ([place]) 01/12/2016  . Venous insufficiency of leg 09/23/2015  . CAD (coronary artery disease) 06/30/2015  . Essential hypertension 06/30/2015  . Essential tremor 06/30/2015  . Preventative health care 06/30/2015  . Carotid artery disease ([place]) 10/02/2014  . Hyperlipidemia   . Type 2 diabetes mellitus with complications [facility])     Past Surgical History  Procedure Laterality Date  . Cholecystectomy  1973  . Coronary artery bypass graft  04/2010     x6, Done at [facility]    Current Outpatient Rx  Name  Route  Sig  Dispense  Refill  . aspirin 81 MG tablet   Oral   Take 1 tablet (81 mg total) by mouth daily.   30 tablet      . ASTRAGALUS PO   Oral   Take 500 mg by mouth.         Marland Kitchen atorvastatin (LIPITOR) 10 MG tablet   Oral   Take 1 tablet (10 mg total) by mouth daily.   90 tablet   3   . Biotin 1000 MCG tablet   Oral   Take 1,000 mcg by mouth 2 (two) times daily.           . Calcium Carbonate-Vitamin D 600-400 MG-UNIT tablet   Oral   Take 1 tablet by mouth daily.         . Carnosine L POWD   Does not apply   by Does not apply route.  500mg   Once daily          . cholecalciferol (VITAMIN D) 1000 UNITS tablet   Oral   Take 1,000 Units by mouth daily.           . Coenzyme Q10 (EQL COQ10) 300 MG CAPS   Oral   Take 1 capsule by mouth daily.           . Cyanocobalamin (B-12) 100 MCG TABS   Oral   Take 1 tablet by mouth daily.           . Digestive Enzymes (PAPAYA ENZYME PO)   Oral   Take 1 capsule by mouth 2 (two) times daily.           . Evening Primrose Oil 1000 MG CAPS   Oral   Take 1 capsule by mouth daily.           . Ginger, Zingiber officinalis, (GINGER ROOT) 550 MG CAPS   Oral   Take 1 capsule by mouth 2 (two) times daily.           . Glucosamine-Chondroit-Vit C-Mn (GLUCOSAMINE 1500 COMPLEX) CAPS   Oral   Take 1 capsule by mouth daily.           . Glutamine 500 MG CAPS   Oral   Take 1 capsule by  mouth daily.           . Glutathione-L POWD      250mg   Once daily          . insulin lispro protamine-lispro (HUMALOG MIX 75/25) (75-25) 100 UNIT/ML SUSP injection               . Insulin Syringe-Needle U-100 (INSULIN SYRINGE .3CC/31GX5/16") 31G X 5/16" 0.3 ML MISC      USE 1 SYRINGE TWICE DAILY   100 each   3   . Krill Oil 300 MG CAPS   Oral   Take 1 capsule by mouth 2 (two) times daily.           . Lancets (ONETOUCH ULTRASOFT) lancets      Use as instructed   100 each   12   . Magnesium 400 MG CAPS   Oral   Take 1 capsule by mouth 2 (two) times daily.           . milk thistle 175 MG tablet   Oral   Take 175 mg by mouth daily.           . NON FORMULARY      Tumeric  500mg  1 by mouth daily          . primidone (MYSOLINE) 50 MG tablet   Oral   Take 1 tablet (50 mg total) by mouth daily. 25 mg by mouth daily   90 tablet   3   . tamsulosin (FLOMAX) 0.4 MG CAPS capsule   Oral   Take 0.4 mg by mouth.         . valsartan-hydrochlorothiazide (DIOVAN HCT) 80-12.5 MG tablet   Oral   Take 1 tablet by mouth daily.   90 tablet   3     Allergies Ciprofloxacin and Codeine  Family History  Problem Relation Age of Onset  . Arthritis Mother   . Heart disease Mother   . Heart attack Mother   . Diabetes Sister   . Heart disease Brother   . Diabetes Brother     Social History Social History  Substance Use Topics  .  Smoking status: Never Smoker   . Smokeless tobacco: None  . Alcohol Use: Yes    Review of Systems Constitutional: No fever/chills Eyes: No visual changes. ENT: No sore throat. No stiff neck no neck pain Cardiovascular: Denies chest pain. Respiratory: Denies shortness of breath. Gastrointestinal:   no vomiting.  No diarrhea.  No constipation. Genitourinary: Negative for dysuria. Musculoskeletal: Negative lower extremity swelling Skin: Negative for rash. Neurological: Negative for headaches, focal weakness or  numbness. 10-point ROS otherwise negative.  ____________________________________________   PHYSICAL EXAM:  VITAL SIGNS: ED Triage Vitals  Enc Vitals Group     BP 05/07/16 2116 123/52 mmHg     Pulse Rate 05/07/16 2116 96     Resp 05/07/16 2116 21     Temp 05/07/16 2116 98.4 F (36.9 C)     Temp Source 05/07/16 2116 Oral     SpO2 05/07/16 2116 96 %     Weight 05/07/16 2116 182 lb (82.555 kg)     Height 05/07/16 2116 5' 10.5" (1.791 m)     Head Cir --      Peak Flow --      Pain Score --      Pain Loc --      Pain Edu? --      Excl. in [place]? --     Constitutional: Alert and oriented. Generally weak and dehydrated in appearance but not toxic or focal Eyes: Conjunctivae are normal. PERRL. EOMI. Head: Atraumatic. Nose: No congestion/rhinnorhea. Mouth/Throat: Mucous membranes are moist.  Oropharynx non-erythematous. Neck: No stridor.   Nontender with no meningismus Cardiovascular: Normal rate, regular rhythm. Grossly normal heart sounds.  Good peripheral circulation. Respiratory: Normal respiratory effort.  No retractions. Lungs CTAB. Abdominal: Soft and nontender. No distention. No guarding no rebound Back:  There is no focal tenderness or step off there is no midline tenderness there are no lesions noted. there is no CVA tenderness Musculoskeletal: No lower extremity tenderness. No joint effusions, no DVT signs strong distal pulses  And there is some mild left greater than right edema, this is chronic, patient states he has had little negative Dopplers of this area Neurologic:  Normal speech and language. No gross focal neurologic deficits are appreciated.  Skin:  Skin is warm, dry and intact. ulcer noted to that he'll does not appear to be acutely infected with no ecchymosis or encephalitis or crepitus Psychiatric: Mood and affect are normal. Speech and behavior are normal.  ____________________________________________   LABS (all labs ordered are listed, but only abnormal  results are displayed)  Labs Reviewed  URINE CULTURE  AMMONIA  ETHANOL  COMPREHENSIVE METABOLIC PANEL  LIPASE, BLOOD  ACETAMINOPHEN LEVEL  SALICYLATE LEVEL  TROPONIN I  CBC WITH DIFFERENTIAL/PLATELET  URINALYSIS COMPLETEWITH MICROSCOPIC ([place])   __Normal sinus rhythm rate 95 bpm no acute ST elevation or ST depression nonspecific changes normal axis __________________________________________  EKG  I personally interpreted any EKGs ordered by me or triage  ____________________________________________  RADIOLOGY  I reviewed any imaging ordered by me or triage that were performed during my shift and, if possible, patient and/or family made aware of any abnormal findings. ____________________________________________   PROCEDURES  Procedure(s) performed: None  Critical Care performed: None  ____________________________________________   INITIAL IMPRESSION / ASSESSMENT AND PLAN / ED COURSE  Pertinent labs & imaging results that were available during my care of the patient were reviewed by me and considered in my medical decision making (see chart for details).  Unfortunately gentleman with metastatic melanoma  bladder origin presents today complaining of feeling generally weak again. No focal complaints but too weak to get around the house and does appear very depleted. Had a negative CT of the head yesterday was guaiac-negative yesterday not reporting any bleeding we'll check diffuse blood work and reassess. Patient will require IV fluid and likely admission at this time  ----------------------------------------- 10:34 PM on 05/07/2016 -----------------------------------------  Patient on chronic doxycycline for urinary tract infections or does appear to have one today. Unclear if this is the cause of his weakness but we will give him antibiotics for it. Otherwise I am reassured by the patient's blood work and vital signs however he is too weak to get around and has  evidence of a urinary tract infection we will treat him and watch him in the hospital. We did try sending him home yesterday, the patient has been too weak to function. ____________________________________________   FINAL CLINICAL IMPRESSION(S) / ED DIAGNOSES  Final diagnoses:  None      This chart was dictated using voice recognition software.  Despite best efforts to proofread,  errors can occur which can change meaning.     Schuyler Amor, MD 05/07/16 2138  Schuyler Amor, MD 05/07/16 [place], MD 05/07/16 [phone]

## 2016-05-07 NOTE — ED Notes (Addendum)
Pt presents with worsened generalized weakness x3 days. Was seen in ED yesterday and given blood transfusion, guaiac negative at that time. Hx stage IV Melanoma, supposed to start chemo this week at [facility]. Pt denies any pain, no n/v/d, no fevers. Hx DM, has non-healing wound to bottom of L foot >90yr. Denies falls at home. Report from first nurse stated pt had to be helped out of the car by two staff members.

## 2016-05-08 LAB — CBC
HEMATOCRIT: 24 % — AB (ref 40.0–52.0)
HEMOGLOBIN: 8 g/dL — AB (ref 13.0–18.0)
MCH: 28.5 pg (ref 26.0–34.0)
MCHC: 33.1 g/dL (ref 32.0–36.0)
MCV: 86.2 fL (ref 80.0–100.0)
PLATELETS: 264 10*3/uL (ref 150–440)
RBC: 2.79 MIL/uL — AB (ref 4.40–5.90)
RDW: 19.1 % — ABNORMAL HIGH (ref 11.5–14.5)
WBC: 8.3 10*3/uL (ref 3.8–10.6)

## 2016-05-08 LAB — BASIC METABOLIC PANEL
Anion gap: 7 (ref 5–15)
BUN: 40 mg/dL — ABNORMAL HIGH (ref 6–20)
CHLORIDE: 100 mmol/L — AB (ref 101–111)
CO2: 23 mmol/L (ref 22–32)
CREATININE: 1.16 mg/dL (ref 0.61–1.24)
Calcium: 7.9 mg/dL — ABNORMAL LOW (ref 8.9–10.3)
GFR calc non Af Amer: 60 mL/min — ABNORMAL LOW (ref >60)
Glucose, Bld: 174 mg/dL — ABNORMAL HIGH (ref 65–99)
POTASSIUM: 4.3 mmol/L (ref 3.5–5.1)
Sodium: 130 mmol/L — ABNORMAL LOW (ref 135–145)

## 2016-05-08 LAB — GLUCOSE, CAPILLARY
GLUCOSE-CAPILLARY: 163 mg/dL — AB (ref 65–99)
Glucose-Capillary: 203 mg/dL — ABNORMAL HIGH (ref 65–99)
Glucose-Capillary: 199 mg/dL — ABNORMAL HIGH (ref 65–99)
Glucose-Capillary: 133 mg/dL — ABNORMAL HIGH (ref 65–99)
Glucose-Capillary: 113 mg/dL — ABNORMAL HIGH (ref 65–99)

## 2016-05-08 MED ORDER — TAMSULOSIN HCL 0.4 MG PO CAPS
0.4000 mg | ORAL_CAPSULE | Freq: Every day | ORAL | Status: DC
  Administered 2016-05-08 – 2016-05-09 (×2): 0.4 mg via ORAL
  Filled 2016-05-08 (×2): qty 1

## 2016-05-08 MED ORDER — DEXTROSE 5 % IV SOLN: 1.0000 g | INTRAVENOUS | Status: DC

## 2016-05-08 MED ORDER — PRIMIDONE 50 MG PO TABS
25.0000 mg | ORAL_TABLET | Freq: Every day | ORAL | Status: DC
  Administered 2016-05-08 – 2016-05-09 (×2): 25 mg via ORAL
  Filled 2016-05-08: qty 1
  Filled 2016-05-08: qty 0.5

## 2016-05-08 MED ORDER — VALACYCLOVIR HCL 500 MG PO TABS
1000.0000 mg | ORAL_TABLET | Freq: Two times a day (BID) | ORAL | Status: AC
  Administered 2016-05-08 (×2): 1000 mg via ORAL
  Filled 2016-05-08 (×2): qty 2

## 2016-05-08 MED ORDER — ATORVASTATIN CALCIUM 10 MG PO TABS
10.0000 mg | ORAL_TABLET | Freq: Every day | ORAL | Status: DC
  Administered 2016-05-08: 09:00:00 10 mg via ORAL
  Filled 2016-05-08: qty 1

## 2016-05-08 MED ORDER — OXYCODONE HCL 5 MG PO TABS
5.0000 mg | ORAL_TABLET | ORAL | Status: DC | PRN
  Administered 2016-05-10: 01:00:00 5 mg via ORAL
  Filled 2016-05-08: qty 1

## 2016-05-08 MED ORDER — INSULIN ASPART PROT & ASPART (70-30 MIX) 100 UNIT/ML ~~LOC~~ SUSP
30.0000 [IU] | Freq: Two times a day (BID) | SUBCUTANEOUS | Status: DC
  Administered 2016-05-08 – 2016-05-09 (×4): 30 [IU] via SUBCUTANEOUS
  Filled 2016-05-08 (×4): qty 30

## 2016-05-08 MED ORDER — SODIUM CHLORIDE 0.9 % IV BOLUS (SEPSIS)
500.0000 mL | Freq: Once | INTRAVENOUS | Status: AC
  Administered 2016-05-08: 500 mL via INTRAVENOUS

## 2016-05-08 MED ORDER — INSULIN ASPART 100 UNIT/ML ~~LOC~~ SOLN
0.0000 [IU] | Freq: Every day | SUBCUTANEOUS | Status: DC
  Administered 2016-05-08: 2 [IU] via SUBCUTANEOUS
  Filled 2016-05-08: qty 2

## 2016-05-08 MED ORDER — HYDROCHLOROTHIAZIDE 12.5 MG PO CAPS
12.5000 mg | ORAL_CAPSULE | Freq: Every day | ORAL | Status: DC
  Administered 2016-05-08: 12.5 mg via ORAL
  Filled 2016-05-08: qty 1

## 2016-05-08 MED ORDER — OXYCODONE HCL 5 MG PO TABS
5.0000 mg | ORAL_TABLET | Freq: Four times a day (QID) | ORAL | Status: DC | PRN
  Administered 2016-05-08: 5 mg via ORAL
  Filled 2016-05-08: qty 1

## 2016-05-08 MED ORDER — ASPIRIN 81 MG PO CHEW
81.0000 mg | CHEWABLE_TABLET | Freq: Every day | ORAL | Status: DC
  Administered 2016-05-08: 81 mg via ORAL
  Filled 2016-05-08: qty 1

## 2016-05-08 MED ORDER — VALSARTAN-HYDROCHLOROTHIAZIDE 80-12.5 MG PO TABS: 1.0000 | ORAL_TABLET | Freq: Every day | ORAL | Status: DC

## 2016-05-08 MED ORDER — ONDANSETRON HCL 4 MG PO TABS: 4.0000 mg | ORAL_TABLET | Freq: Four times a day (QID) | ORAL | Status: DC | PRN

## 2016-05-08 MED ORDER — ENOXAPARIN SODIUM 40 MG/0.4ML ~~LOC~~ SOLN
40.0000 mg | Freq: Every day | SUBCUTANEOUS | Status: DC
  Administered 2016-05-08 (×2): 40 mg via SUBCUTANEOUS
  Filled 2016-05-08 (×3): qty 0.4

## 2016-05-08 MED ORDER — ACETAMINOPHEN 325 MG PO TABS
650.0000 mg | ORAL_TABLET | Freq: Four times a day (QID) | ORAL | Status: DC | PRN
Start: 2016-05-08 — End: 2016-05-10
  Administered 2016-05-08: 650 mg via ORAL
  Filled 2016-05-08: qty 2

## 2016-05-08 MED ORDER — INSULIN ASPART 100 UNIT/ML ~~LOC~~ SOLN
0.0000 [IU] | Freq: Three times a day (TID) | SUBCUTANEOUS | Status: DC
  Administered 2016-05-08 – 2016-05-09 (×4): 3 [IU] via SUBCUTANEOUS
  Administered 2016-05-09: 09:00:00 2 [IU] via SUBCUTANEOUS
  Administered 2016-05-09: 12:00:00 5 [IU] via SUBCUTANEOUS
  Filled 2016-05-08: qty 3
  Filled 2016-05-08: qty 2
  Filled 2016-05-08: qty 5
  Filled 2016-05-08 (×3): qty 3

## 2016-05-08 MED ORDER — BISACODYL 5 MG PO TBEC
5.0000 mg | DELAYED_RELEASE_TABLET | Freq: Every day | ORAL | Status: DC | PRN
  Administered 2016-05-08 – 2016-05-09 (×2): 5 mg via ORAL
  Filled 2016-05-08 (×2): qty 1

## 2016-05-08 MED ORDER — SODIUM CHLORIDE 0.9 % IV SOLN
INTRAVENOUS | Status: DC
  Administered 2016-05-08 – 2016-05-09 (×4): via INTRAVENOUS

## 2016-05-08 MED ORDER — IRBESARTAN 75 MG PO TABS
75.0000 mg | ORAL_TABLET | Freq: Every day | ORAL | Status: DC
  Administered 2016-05-08: 09:00:00 75 mg via ORAL
  Filled 2016-05-08: qty 1

## 2016-05-08 MED ORDER — ONDANSETRON HCL 4 MG/2ML IJ SOLN
4.0000 mg | Freq: Four times a day (QID) | INTRAMUSCULAR | Status: DC | PRN
  Administered 2016-05-08: 4 mg via INTRAVENOUS
  Filled 2016-05-08: qty 2

## 2016-05-08 MED ORDER — ACETAMINOPHEN 650 MG RE SUPP
650.0000 mg | Freq: Four times a day (QID) | RECTAL | Status: DC | PRN
Start: 2016-05-08 — End: 2016-05-10

## 2016-05-08 MED ORDER — DEXTROSE 5 % IV SOLN
1.0000 g | INTRAVENOUS | Status: DC
  Administered 2016-05-08 – 2016-05-09 (×2): 1 g via INTRAVENOUS
  Filled 2016-05-08 (×2): qty 10

## 2016-05-08 NOTE — Progress Notes (Signed)
Patient ID: [name], male   DOB: July 05, 1941, 75 y.o.   MRN: [id number] [facility] PROGRESS NOTE  [name] [name] [id number] DOB: [birth year] DOA: 05/07/2016 PCP: Coral Spikes, DO  HPI/Subjective: Patient recently diagnosed with metastatic melanoma. Patient feels very weak. Also has fever and urinary tract infection.  Objective: Filed Vitals:   05/08/16 1255 05/08/16 1330  BP: 94/44 100/45  Pulse: 84   Temp: 101.1 F (38.4 C)   Resp: 20     Filed Weights   05/07/16 2116 05/08/16 0103  Weight: 82.555 kg (182 lb) 85.186 kg (187 lb 12.8 oz)    ROS: Review of Systems  Constitutional: Positive for fever. Negative for chills.  Eyes: Negative for blurred vision.  Respiratory: Negative for cough and shortness of breath.   Cardiovascular: Negative for chest pain.  Gastrointestinal: Positive for abdominal pain. Negative for nausea, vomiting, diarrhea and constipation.  Genitourinary: Negative for dysuria.  Musculoskeletal: Negative for joint pain.  Neurological: Positive for weakness. Negative for dizziness and headaches.   Exam: Physical Exam  Constitutional: He is oriented to person, place, and time.  HENT:  Nose: No mucosal edema.  Mouth/Throat: No oropharyngeal exudate or posterior oropharyngeal edema.  Eyes: Conjunctivae, EOM and lids are normal. Pupils are equal, round, and reactive to light.  Neck: No JVD present. Carotid bruit is not present. No edema present. No thyroid mass and no thyromegaly present.  Cardiovascular: S1 normal and S2 normal.  Exam reveals no gallop.   No murmur heard. Pulses:      Dorsalis pedis pulses are 2+ on the right side, and 2+ on the left side.  Respiratory: No respiratory distress. He has no wheezes. He has no rhonchi. He has no rales.  GI: Soft. Bowel sounds are normal. There is no tenderness.  Musculoskeletal:       Right ankle: He exhibits swelling.       Left ankle: He exhibits swelling.  Lymphadenopathy:    He has no  cervical adenopathy.  Neurological: He is alert and oriented to person, place, and time. No cranial nerve deficit.  Patient able to straight leg raise without a problem.  Skin: Skin is warm. Nails show no clubbing.  Left heel small ulceration that has not been healing. Fever blisters left upper and lower lip.  Psychiatric: He has a normal mood and affect.      Data Reviewed: Basic Metabolic Panel:  Recent Labs Lab 05/06/16 1156 05/07/16 2115 05/08/16 0418  NA 132* 130* 130*  K 4.7 4.6 4.3  CL 98* 98* 100*  CO2 25 22 23   GLUCOSE 186* 240* 174*  BUN 44* 44* 40*  CREATININE 1.27* 1.31* 1.16  CALCIUM 8.3* 8.4* 7.9*   Liver Function Tests:  Recent Labs Lab 05/06/16 1156 05/07/16 2115  AST 60* 75*  ALT 35 44  ALKPHOS 154* 197*  BILITOT 0.9 1.0  PROT 5.7* 5.7*  ALBUMIN 2.3* 2.2*    Recent Labs Lab 05/07/16 2115  LIPASE 14    Recent Labs Lab 05/07/16 2151  AMMONIA 19   CBC:  Recent Labs Lab 05/06/16 1156 05/07/16 2115 05/08/16 0418  WBC 8.9 10.3 8.3  NEUTROABS  --  8.5*  --   HGB 7.9* 9.3* 8.0*  HCT 24.0* 27.3* 24.0*  MCV 83.0 84.6 86.2  PLT 307 313 264   Cardiac Enzymes:  Recent Labs Lab 05/06/16 1156 05/07/16 2115  TROPONINI <0.03 <0.03    ProBNP (last 3 results)  Recent Labs  01/11/16 1217  PROBNP 137.0*    CBG:  Recent Labs Lab 05/08/16 0148 05/08/16 0824 05/08/16 1154  GLUCAP 203* 199* 163*    Recent Results (from the past 240 hour(s))  Culture, blood (routine x 2)     Status: None (Preliminary result)   Collection Time: 05/07/16 11:04 PM  Result Value Ref Range Status   Specimen Description BLOOD FEMORAL ARTERY  Final   Special Requests BOTTLES DRAWN AEROBIC AND ANAEROBIC 8CC  Final   Culture NO GROWTH < 12 HOURS  Final   Report Status PENDING  Incomplete  Culture, blood (routine x 2)     Status: None (Preliminary result)   Collection Time: 05/07/16 11:30 PM  Result Value Ref Range Status   Specimen Description  BLOOD FEMORAL ARTERY  Final   Special Requests BOTTLES DRAWN AEROBIC AND ANAEROBIC 6CC  Final   Culture NO GROWTH < 12 HOURS  Final   Report Status PENDING  Incomplete     Studies: Dg Chest 2 View  05/07/2016  CLINICAL DATA:  Acute onset worsening generalized weakness and difficulty speaking. Initial encounter. EXAM: CHEST  2 VIEW COMPARISON:  Chest radiograph and CTA of the chest performed 01/11/2016 FINDINGS: The lungs are well-aerated. Mild bilateral atelectasis is noted. Small bilateral pleural effusions are seen. There is no evidence of pneumothorax. Left lateral pleural thickening is noted, of uncertain significance. The heart is normal in size; the patient is status post median sternotomy, with evidence of prior CABG. No acute osseous abnormalities are seen. IMPRESSION: Mild bilateral atelectasis noted. Small bilateral pleural effusions seen. Left lateral pleural thickening, of uncertain significance. Electronically Signed   By: Garald Balding M.D.   On: 05/07/2016 22:07    Scheduled Meds: . aspirin  81 mg Oral Daily  . atorvastatin  10 mg Oral Daily  . cefTRIAXone (ROCEPHIN)  IV  1 g Intravenous Q24H  . enoxaparin (LOVENOX) injection  40 mg Subcutaneous QHS  . insulin aspart  0-15 Units Subcutaneous TID WC  . insulin aspart  0-5 Units Subcutaneous QHS  . insulin aspart protamine- aspart  30 Units Subcutaneous BID WC  . primidone  25 mg Oral Daily  . tamsulosin  0.4 mg Oral Daily  . valACYclovir  1,000 mg Oral BID   Continuous Infusions: . sodium chloride 50 mL/hr at 05/08/16 1315    Assessment/Plan:  1. Acute cystitis with hematuria, fever. IV Rocephin and IV fluid hydration. Follow-up urine culture. So far blood cultures are negative. 2. Relative hypotension. Give IV fluid hydration. Stop blood pressure medications. Check orthostatic vitals. 3. Weakness. We'll get physical therapy evaluation. 4. Metastatic melanoma. He has an appointment at [facility] on Wednesday for  immunotherapy. 5. Hyperlipidemia unspecified. Because of the weakness I will get rid of atorvastatin 6. Anemia of chronic disease. Stop aspirin. 7. Type 2 diabetes mellitus. Continue sliding scale insulin and long-acting insulin as he normally takes 8. BPH on Flomax 9. Fever blisters give 2 doses of valacyclovir  Code Status:     Code Status Orders        Start     Ordered   05/08/16 0104  Full code   Continuous     05/08/16 0103    Code Status History    Date Active Date Inactive Code Status Order ID Comments User Context   This patient has a current code status but no historical code status.     Family Communication: Family at bedside Disposition Plan: Will evaluate tomorrow on the need for transfer to [facility]  for Wednesday or whether I can get him home or not.  Antibiotics:  Rocephin  Time spent: 35 minutes  Loletha Grayer  [employer]

## 2016-05-08 NOTE — Evaluation (Signed)
Physical Therapy Evaluation Patient Details Name: [name] [name] MRN: [id number] DOB: Feb 19, 1941 Today's Date: 05/08/2016   History of Present Illness  75 yo male with onset of falls at home with low BP has been a CA patient with bladder melanoma, mets lymphatically.  Has treatment at [facility] Wednesday and may be sent home before then.  PMHx:  DM, CAD, benign tremor, transfusion, CABG, anemia  Clinical Impression  Pt is getting up to stand with low diastolic pressure for entire visit, but wanted to get him into a mode of expectation for follow up being SNF in case his BP is too low for home tomorrow.  He is appropriate for follow up but needs close monitoring for his vitals in case his numbers are too low for safe gait.Family is expecting him to go to [place] on Wednesday to receive chemo.    Follow Up Recommendations SNF    Equipment Recommendations  None recommended by PT    Recommendations for Other Services Rehab consult     Precautions / Restrictions Precautions Precautions: Fall (telemetry) Restrictions Weight Bearing Restrictions: No      Mobility  Bed Mobility Overal bed mobility: Needs Assistance Bed Mobility: Supine to Sit;Sit to Supine     Supine to sit: Min guard;Min assist Sit to supine: Min guard;Min assist   General bed mobility comments: assisted trunk and cued with legs, assisted legs'  Transfers Overall transfer level: Needs assistance   Transfers: Sit to/from Stand;Stand Pivot Transfers Sit to Stand: Min guard;Min assist Stand pivot transfers: Min guard;Min assist       General transfer comment: stood to check vitals with unsteadiness on his feet with RW, PT assisted him to ck BP  Ambulation/Gait             General Gait Details: unable due to low BP  Stairs            Wheelchair Mobility    Modified Rankin (Stroke Patients Only)       Balance Overall balance assessment: Needs assistance Sitting-balance support: Feet  supported;Bilateral upper extremity supported Sitting balance-Leahy Scale: Poor Sitting balance - Comments: fair to poor ranges      Standing balance-Leahy Scale: Poor                               Pertinent Vitals/Pain Pain Assessment: No/denies pain    Home Living Family/patient expects to be discharged to:: Private residence Living Arrangements: Spouse/significant other Available Help at Discharge: Family;Available 24 hours/day Type of Home: House         Home Equipment: [name] - single point      Prior Function Level of Independence: Independent with assistive device(s)               Hand Dominance   Dominant Hand: Right    Extremity/Trunk Assessment   Upper Extremity Assessment: Overall WFL for tasks assessed           Lower Extremity Assessment: Generalized weakness      Cervical / Trunk Assessment: Kyphotic  Communication   Communication: No difficulties  Cognition Arousal/Alertness: Awake/alert Behavior During Therapy: WFL for tasks assessed/performed Overall Cognitive Status: Within Functional Limits for tasks assessed                      General Comments General comments (skin integrity, edema, etc.): Pt was assessed for supine, sitting and standing fro all vitals.  Supine  sat 96%, pulse 84, BP 88/43.  Sitting pulse ox 98% and pulse 91, BP 92/47.  Standing 98%, pulse 95, BP 100/45.  Sat pt due to feeling unsteady.    Exercises        Assessment/Plan    PT Assessment Patient needs continued PT services  PT Diagnosis Difficulty walking   PT Problem List Decreased strength;Decreased range of motion;Decreased activity tolerance;Decreased balance;Decreased mobility;Decreased coordination;Decreased cognition;Decreased knowledge of use of DME;Decreased safety awareness;Decreased knowledge of precautions;Cardiopulmonary status limiting activity  PT Treatment Interventions DME instruction;Gait training;Functional mobility  training;Therapeutic activities;Therapeutic exercise;Balance training;Neuromuscular re-education;Patient/family education   PT Goals (Current goals can be found in the Care Plan section) Acute Rehab PT Goals Patient Stated Goal: to be safe at home PT Goal Formulation: With patient/family Time For Goal Achievement: 05/22/16 Potential to Achieve Goals: Good    Frequency Min 2X/week   Barriers to discharge   Pt is higher fall risk due to his BP and tendency to lose balance backward    Co-evaluation               End of Session Equipment Utilized During Treatment: Gait belt;Oxygen Activity Tolerance: Patient tolerated treatment well;Patient limited by lethargy Patient left: in bed;with call bell/phone within reach;with bed alarm set;with family/visitor present Nurse Communication: Mobility status         Time: 1315-1341 PT Time Calculation (min) (ACUTE ONLY): 26 min   Charges:   PT Evaluation $PT Eval Moderate Complexity: 1 Procedure PT Treatments $Gait Training: 8-22 mins   PT G CodesRamond Dial 2016-06-07, 4:04 PM    Mee Hives, PT MS Acute Rehab Dept. Number: [place]

## 2016-05-08 NOTE — Consult Note (Signed)
WOC wound consult note Reason for Consult: Left lateral heel, chronic neuropathic ulcer.  Wife states the wound was resolving and MD took biopsy of site.  Resolving.  Wound type: Chronic neuropathic ulcer, recent biopsy site.  Pressure Ulcer POA: N/A Measurement: 3 cm x 3 cm x 0.1 cm  Wound [id number] and moist  Drainage (amount, consistency, odor) Scant serosnaguinous  No odor.  Periwound:Intact Dressing procedure/placement/frequency:Cleanse wound to left lateral heel with NS and pat gently dry.  Apply silicone border foam dressing.  Change every 3 days and PRN soilage.  Apply CircAid compression hose.   Will not follow at this time.  Please re-consult if needed.  Domenic Moras RN BSN [place] Pager [phone]

## 2016-05-09 LAB — CBC
HEMATOCRIT: 24.1 % — AB (ref 40.0–52.0)
Hemoglobin: 8 g/dL — ABNORMAL LOW (ref 13.0–18.0)
MCH: 28.1 pg (ref 26.0–34.0)
MCHC: 33.1 g/dL (ref 32.0–36.0)
MCV: 85 fL (ref 80.0–100.0)
Platelets: 288 10*3/uL (ref 150–440)
RBC: 2.83 MIL/uL — ABNORMAL LOW (ref 4.40–5.90)
RDW: 18.8 % — AB (ref 11.5–14.5)
WBC: 8.6 10*3/uL (ref 3.8–10.6)

## 2016-05-09 LAB — BASIC METABOLIC PANEL
Anion gap: 7 (ref 5–15)
BUN: 37 mg/dL — AB (ref 6–20)
CALCIUM: 7.9 mg/dL — AB (ref 8.9–10.3)
CO2: 22 mmol/L (ref 22–32)
Chloride: 100 mmol/L — ABNORMAL LOW (ref 101–111)
Creatinine, Ser: 1.13 mg/dL (ref 0.61–1.24)
GFR calc Af Amer: >60 mL/min (ref >60)
GLUCOSE: 138 mg/dL — AB (ref 65–99)
POTASSIUM: 4.4 mmol/L (ref 3.5–5.1)
Sodium: 129 mmol/L — ABNORMAL LOW (ref 135–145)

## 2016-05-09 LAB — HEPATIC FUNCTION PANEL
ALT: 62 U/L (ref 17–63)
AST: 105 U/L — AB (ref 15–41)
Albumin: 1.8 g/dL — ABNORMAL LOW (ref 3.5–5.0)
Alkaline Phosphatase: 191 U/L — ABNORMAL HIGH (ref 38–126)
BILIRUBIN INDIRECT: 0.3 mg/dL (ref 0.3–0.9)
Bilirubin, Direct: 0.2 mg/dL (ref 0.1–0.5)
Total Bilirubin: 0.5 mg/dL (ref 0.3–1.2)
Total Protein: 4.8 g/dL — ABNORMAL LOW (ref 6.5–8.1)

## 2016-05-09 LAB — GLUCOSE, CAPILLARY
GLUCOSE-CAPILLARY: 264 mg/dL — AB (ref 65–99)
Glucose-Capillary: 127 mg/dL — ABNORMAL HIGH (ref 65–99)
Glucose-Capillary: 194 mg/dL — ABNORMAL HIGH (ref 65–99)
Glucose-Capillary: 88 mg/dL (ref 65–99)

## 2016-05-09 LAB — TSH: TSH: 2.305 u[IU]/mL (ref 0.350–4.500)

## 2016-05-09 LAB — LACTATE DEHYDROGENASE: LDH: 2277 U/L — AB (ref 98–192)

## 2016-05-09 MED ORDER — ONDANSETRON HCL 4 MG PO TABS: 4.0000 mg | ORAL_TABLET | Freq: Four times a day (QID) | ORAL | Status: DC | PRN

## 2016-05-09 MED ORDER — OXYCODONE HCL 5 MG PO TABS: 5.0000 mg | ORAL_TABLET | ORAL | Status: DC | PRN

## 2016-05-09 MED ORDER — DOCUSATE SODIUM 100 MG PO CAPS
100.0000 mg | ORAL_CAPSULE | Freq: Two times a day (BID) | ORAL | Status: DC
  Administered 2016-05-09 (×2): 100 mg via ORAL
  Filled 2016-05-09 (×2): qty 1

## 2016-05-09 MED ORDER — CEPHALEXIN 500 MG PO CAPS: 500.0000 mg | ORAL_CAPSULE | Freq: Three times a day (TID) | ORAL | Status: DC

## 2016-05-09 NOTE — Care Management (Signed)
Physical therapy recommending home with home health and physical therapy. Discussed home health agencies with [name] [name] and family. [name] [name]. [name] [name], representative for [facility] updated. Rolling walker will be provided by [facility]. Will deliver rolling walker today.  Seen by Dr. Lacinda Axon earlier this week(primary care physician). No home health in the past. No skilled facility.  Lives with wife, [name] [name] [phone]). Stumbled prior to this admission. Decreased appetite. No medical equipment in the home.  Spoke with Dr. Leslye Peer. Will be discharging [name] at 7:00am in the morning so he can go to his appointment at [facility] in the [place] RN MSN [employer] [phone]

## 2016-05-09 NOTE — Discharge Instructions (Signed)

## 2016-05-09 NOTE — Care Management Important Message (Signed)
Important Message  Patient Details  Name: [name] [name] MRN: [id number] Date of Birth: June 02, 1941   Medicare Important Message Given:  Yes    Juliann Pulse A Loris Seelye 05/09/2016, 12:09 PM

## 2016-05-09 NOTE — Progress Notes (Signed)
Physical Therapy Treatment Patient Details Name: [name] [name] MRN: [id number] DOB: 16-Oct-1941 Today's Date: 05/09/2016    History of Present Illness 75 yo male with onset of falls at home with low BP has been a CA patient with bladder melanoma, mets lymphatically.  Has treatment at [facility] Wednesday and may be sent home before then.  PMHx:  DM, CAD, benign tremor, transfusion, CABG, anemia    PT Comments    Pt is able to ambulate ~200 ft with SPC, but had some LOBs and safety issues and would benefit from having a rolling walker (pt did well with RW with in room activities).  Pt continues to have low BP but did not have lightheadedness or other symptoms.  Supine BP as 110/55, sitting 112/48, standing 98/49.  Pt should be able to go home safely with HHPT and RW.  Follow Up Recommendations  Home health PT     Equipment Recommendations  Rolling walker with 5" wheels    Recommendations for Other Services       Precautions / Restrictions Precautions Precautions: Fall Restrictions Weight Bearing Restrictions: No    Mobility  Bed Mobility Overal bed mobility: Needs Assistance Bed Mobility: Supine to Sit     Supine to sit: Min assist     General bed mobility comments: Pt able to use good upper body strength to pull himself up  Transfers Overall transfer level: Modified independent Equipment used: Rolling walker (2 wheeled);Straight cane Transfers: Sit to/from Stand Sit to Stand: Min guard;Min assist         General transfer comment: Pt able to stand with cane and walker on different bout with no safety issues.  Ambulation/Gait Ambulation/Gait assistance: Min assist;Min guard Ambulation Distance (Feet): 200 Feet Assistive device: Straight cane       General Gait Details: Pt was able to ambulate with SPC but was unsafe much of the time and did have a few small LOBs that required assist to keep him upright.  Pt was able to increase stability with cues to slow down and  improve posture, but ultimately he would be much safer with FWW.   Stairs Stairs: Yes Stairs assistance: Supervision Stair Management: One rail Right;With cane Number of Stairs: 5 General stair comments: Pt was able to negotiate up/down steps with CGA.  Pt was safe, but reliant on the rail for support.   Wheelchair Mobility    Modified Rankin (Stroke Patients Only)       Balance                                    Cognition Arousal/Alertness: Awake/alert Behavior During Therapy: WFL for tasks assessed/performed Overall Cognitive Status: Within Functional Limits for tasks assessed                      Exercises      General Comments        Pertinent Vitals/Pain Pain Assessment:  (chronic low back pain)    Home Living                      Prior Function            PT Goals (current goals can now be found in the care plan section) Progress towards PT goals: Progressing toward goals    Frequency  Min 2X/week    PT Plan Discharge plan needs to be updated  Co-evaluation             End of Session Equipment Utilized During Treatment: Gait belt;Oxygen Activity Tolerance: Patient tolerated treatment well;Patient limited by lethargy Patient left: in bed;with call bell/phone within reach;with bed alarm set;with family/visitor present     Time: [id number] PT Time Calculation (min) (ACUTE ONLY): 26 min  Charges:  $Gait Training: 8-22 mins $Therapeutic Activity: 8-22 mins                    G Codes:      [name] [name] 05-10-2016, 11:50 AM

## 2016-05-09 NOTE — Clinical Social Work Note (Signed)
Clinical Social Work Assessment  Patient Details  Name: [name] [name] MRN: [id number] Date of Birth: 1941-12-02  Date of referral:  05/09/16               Reason for consult:  Facility Placement                Permission sought to share information with:  Chartered certified accountant granted to share information::  Yes, Verbal Permission Granted  Name::        Agency::     Relationship::     Contact Information:     Housing/Transportation Living arrangements for the past 2 months:  Single Family Home Source of Information:  Patient, Spouse Patient Interpreter Needed:  None Criminal Activity/Legal Involvement Pertinent to Current Situation/Hospitalization:  No - Comment as needed Significant Relationships:  Spouse Lives with:  Spouse Do you feel safe going back to the place where you live?  Yes Need for family participation in patient care:  Yes (Comment)  Care giving concerns:  Patient lives in [place] with his wife [name] [name].   Social Worker assessment / plan:  Holiday representative (CSW) received verbal consult from MD stating that PT is recommending SNF and patient will be discharged tomorrow and go to his treatment at [facility]. CSW met with patient and his wife [name] [name] was at bedside. CSW introduced self and explained role of CSW department. Patient was alert and oriented and was sitting up in the chair. Patient reported that he lives in [place] with his wife [name] [name]. Per patient he is in an experimental trial at [facility] and that is the only place he gets treatment at. CSW explained SNF process. Patient is agreeable to SNF search in [facility] and reported that his wife would transport him to [place].  PT changed recommendation from SNF to home health this afternoon. Per RN Case Manager plan is now for patient to D/C tomorrow morning and he will go home. Please reconsult if future social work needs arise. CSW signing off.   Employment status:  Retired Radiation protection practitioner:  Information systems manager PT Recommendations:  [place], Home with [facility] / Referral to community resources:  Other (Comment Required) (Home Health )  Patient/Family's Response to care:  Per RN case manager patient and wife are agreeable for patient to go home with home health.   Patient/Family's Understanding of and Emotional Response to Diagnosis, Current Treatment, and Prognosis:  Patient and wife were pleasant and thanked CSW for visit.   Emotional Assessment Appearance:  Appears stated age Attitude/Demeanor/Rapport:    Affect (typically observed):  Accepting, Adaptable, Pleasant Orientation:  Oriented to Self, Oriented to Place, Oriented to  Time, Oriented to Situation Alcohol / Substance use:  Not Applicable Psych involvement (Current and /or in the community):  No (Comment)  Discharge Needs  Concerns to be addressed:  Discharge Planning Concerns Readmission within the last 30 days:  No Current discharge risk:  None Barriers to Discharge:  Continued Medical Work up   Loralyn Freshwater, LCSW 05/09/2016, 3:08 PM

## 2016-05-09 NOTE — Progress Notes (Signed)
Patient ID: [name] [name], male   DOB: 1941-10-22, 75 y.o.   MRN: [id number] [place] PROGRESS NOTE  BRIAN GIUSTINO [id number] DOB: 1941-01-07 DOA: 05/07/2016 PCP: Coral Spikes, DO  HPI/Subjective: Patient feeling a little bit better today but still not great. Feels weak but did better with physical therapy. Some abdominal pain.  Objective: Filed Vitals:   05/09/16 0451 05/09/16 1343  BP: 108/51 104/51  Pulse: 97 91  Temp: 99.1 F (37.3 C) 98.8 F (37.1 C)  Resp: 18     Filed Weights   05/07/16 2116 05/08/16 0103  Weight: 82.555 kg (182 lb) 85.186 kg (187 lb 12.8 oz)    ROS: Review of Systems  Constitutional: Positive for fever. Negative for chills.  Eyes: Negative for blurred vision.  Respiratory: Negative for cough and shortness of breath.   Cardiovascular: Negative for chest pain.  Gastrointestinal: Positive for abdominal pain. Negative for nausea, vomiting, diarrhea and constipation.  Genitourinary: Negative for dysuria.  Musculoskeletal: Negative for joint pain.  Neurological: Positive for weakness. Negative for dizziness and headaches.   Exam: Physical Exam  Constitutional: He is oriented to person, place, and time.  HENT:  Nose: No mucosal edema.  Mouth/Throat: No oropharyngeal exudate or posterior oropharyngeal edema.  Eyes: Conjunctivae, EOM and lids are normal. Pupils are equal, round, and reactive to light.  Neck: No JVD present. Carotid bruit is not present. No edema present. No thyroid mass and no thyromegaly present.  Cardiovascular: S1 normal and S2 normal.  Exam reveals no gallop.   No murmur heard. Pulses:      Dorsalis pedis pulses are 2+ on the right side, and 2+ on the left side.  Respiratory: No respiratory distress. He has no wheezes. He has no rhonchi. He has no rales.  GI: Soft. Bowel sounds are normal. There is no tenderness.  Musculoskeletal:       Right ankle: He exhibits swelling.       Left ankle: He exhibits swelling.   Lymphadenopathy:    He has no cervical adenopathy.  Neurological: He is alert and oriented to person, place, and time. No cranial nerve deficit.  Patient able to straight leg raise without a problem.  Skin: Skin is warm. Nails show no clubbing.  Left heel small ulceration that has not been healing. Fever blisters left upper and lower lip.  Psychiatric: He has a normal mood and affect.      Data Reviewed: Basic Metabolic Panel:  Recent Labs Lab 05/06/16 1156 05/07/16 2115 05/08/16 0418 05/09/16 0406  NA 132* 130* 130* 129*  K 4.7 4.6 4.3 4.4  CL 98* 98* 100* 100*  CO2 25 22 23 22   GLUCOSE 186* 240* 174* 138*  BUN 44* 44* 40* 37*  CREATININE 1.27* 1.31* 1.16 1.13  CALCIUM 8.3* 8.4* 7.9* 7.9*   Liver Function Tests:  Recent Labs Lab 05/06/16 1156 05/07/16 2115 05/09/16 1019  AST 60* 75* 105*  ALT 35 44 62  ALKPHOS 154* 197* 191*  BILITOT 0.9 1.0 0.5  PROT 5.7* 5.7* 4.8*  ALBUMIN 2.3* 2.2* 1.8*    Recent Labs Lab 05/07/16 2115  LIPASE 14    Recent Labs Lab 05/07/16 2151  AMMONIA 19   CBC:  Recent Labs Lab 05/06/16 1156 05/07/16 2115 05/08/16 0418 05/09/16 0406  WBC 8.9 10.3 8.3 8.6  NEUTROABS  --  8.5*  --   --   HGB 7.9* 9.3* 8.0* 8.0*  HCT 24.0* 27.3* 24.0* 24.1*  MCV 83.0 84.6 86.2  85.0  PLT 307 313 264 288   Cardiac Enzymes:  Recent Labs Lab 05/06/16 1156 05/07/16 2115  TROPONINI <0.03 <0.03    ProBNP (last 3 results)  Recent Labs  01/11/16 1217  PROBNP 137.0*    CBG:  Recent Labs Lab 05/08/16 1154 05/08/16 1654 05/08/16 2109 05/09/16 0734 05/09/16 1115  GLUCAP 163* 133* 113* 127* 264*    Recent Results (from the past 240 hour(s))  Urine culture     Status: None (Preliminary result)   Collection Time: 05/07/16  9:37 PM  Result Value Ref Range Status   Specimen Description URINE, CLEAN CATCH  Final   Special Requests NONE  Final   Culture   Final    CULTURE REINCUBATED FOR BETTER GROWTH Performed at [facility]    Report Status PENDING  Incomplete  Culture, blood (routine x 2)     Status: None (Preliminary result)   Collection Time: 05/07/16 11:04 PM  Result Value Ref Range Status   Specimen Description BLOOD FEMORAL ARTERY  Final   Special Requests BOTTLES DRAWN AEROBIC AND ANAEROBIC 8CC  Final   Culture NO GROWTH 2 DAYS  Final   Report Status PENDING  Incomplete  Culture, blood (routine x 2)     Status: None (Preliminary result)   Collection Time: 05/07/16 11:30 PM  Result Value Ref Range Status   Specimen Description BLOOD FEMORAL ARTERY  Final   Special Requests BOTTLES DRAWN AEROBIC AND ANAEROBIC 6CC  Final   Culture NO GROWTH 2 DAYS  Final   Report Status PENDING  Incomplete     Studies: Dg Chest 2 View  05/07/2016  CLINICAL DATA:  Acute onset worsening generalized weakness and difficulty speaking. Initial encounter. EXAM: CHEST  2 VIEW COMPARISON:  Chest radiograph and CTA of the chest performed 01/11/2016 FINDINGS: The lungs are well-aerated. Mild bilateral atelectasis is noted. Small bilateral pleural effusions are seen. There is no evidence of pneumothorax. Left lateral pleural thickening is noted, of uncertain significance. The heart is normal in size; the patient is status post median sternotomy, with evidence of prior CABG. No acute osseous abnormalities are seen. IMPRESSION: Mild bilateral atelectasis noted. Small bilateral pleural effusions seen. Left lateral pleural thickening, of uncertain significance. Electronically Signed   By: Garald Balding M.D.   On: 05/07/2016 22:07    Scheduled Meds: . cefTRIAXone (ROCEPHIN)  IV  1 g Intravenous Q24H  . docusate sodium  100 mg Oral BID  . enoxaparin (LOVENOX) injection  40 mg Subcutaneous QHS  . insulin aspart  0-15 Units Subcutaneous TID WC  . insulin aspart  0-5 Units Subcutaneous QHS  . insulin aspart protamine- aspart  30 Units Subcutaneous BID WC  . primidone  25 mg Oral Daily  . tamsulosin  0.4 mg Oral Daily    Continuous Infusions: . sodium chloride 50 mL/hr at 05/09/16 0218    Assessment/Plan:  1. Acute cystitis with hematuria, fever. IV Rocephin and IV fluid hydration. Urine culture still incubating. So far blood cultures are negative. 2. Relative hypotension. Give IV fluid hydration. Stop blood pressure medications. 3. Weakness. Physical therapy recommended home with home health. 4. Hyponatremia. Likely combination of not eating, taking hydrochlorothiazide and with the melanoma. This likely won't get better with the IV fluid hydration. Hopefully as we stop the hydrochlorothiazide this should get better 5. Metastatic melanoma. He has an appointment at [facility] on Wednesday for immunotherapy. I will see him tomorrow morning at 7 AM for potential discharge. LDH high at  2277 6. Hyperlipidemia unspecified. Because of the weakness I will get rid of atorvastatin 7. Anemia of chronic disease. Stop aspirin. Hemoglobin stable at 8. 8. Type 2 diabetes mellitus. Continue sliding scale insulin and long-acting insulin as he normally takes 9. BPH on Flomax 10. Fever blisters gave 2 doses of valacyclovir  Code Status:     Code Status Orders        Start     Ordered   05/08/16 0104  Full code   Continuous     05/08/16 0103    Code Status History    Date Active Date Inactive Code Status Order ID Comments User Context   This patient has a current code status but no historical code status.     Family Communication: Family at bedside Disposition Plan: Will evaluate tomorrow 7 A.m. for discharge. Case discussed with family and oncologist.  Antibiotics:  Rocephin  Time spent: 51 minutes  [place], [place]

## 2016-05-10 LAB — BASIC METABOLIC PANEL WITH GFR
Anion gap: 5 (ref 5–15)
BUN: 32 mg/dL — ABNORMAL HIGH (ref 6–20)
CO2: 23 mmol/L (ref 22–32)
Calcium: 8 mg/dL — ABNORMAL LOW (ref 8.9–10.3)
Chloride: 104 mmol/L (ref 101–111)
Creatinine, Ser: 0.96 mg/dL (ref 0.61–1.24)
GFR calc Af Amer: >60 mL/min
GFR calc non Af Amer: >60 mL/min
Glucose, Bld: 71 mg/dL (ref 65–99)
Potassium: 4.2 mmol/L (ref 3.5–5.1)
Sodium: 132 mmol/L — ABNORMAL LOW (ref 135–145)

## 2016-05-10 LAB — URINE CULTURE

## 2016-05-10 NOTE — Progress Notes (Signed)
Washed pt's hair and bathed in preparation for discharge today.  Pt has an appointment at [facility] at 830 this morning.  Pt dressed except for shoes. Dorna Bloom RN

## 2016-05-10 NOTE — Discharge Summary (Signed)
[place] [facility] [place] NAME: [name] [name]    MR#:  [id number]  DATE OF BIRTH:  December 04, 1941  DATE OF ADMISSION:  05/07/2016 ADMITTING PHYSICIAN: Quintella Baton, MD  DATE OF DISCHARGE: 05/10/2016  PRIMARY CARE PHYSICIAN: Coral Spikes, DO    ADMISSION DIAGNOSIS:  Weakness [R53.1] UTI (lower urinary tract infection) [N39.0]  DISCHARGE DIAGNOSIS:  Principal Problem:   UTI (lower urinary tract infection) Active Problems:   CAD (coronary artery disease)   Essential hypertension   Essential tremor   Metastatic melanoma ([place])   Weakness   Melanoma ([place])   Diabetes mellitus ([place])   SECONDARY DIAGNOSIS:   Past Medical History  Diagnosis Date  . Insulin dependent diabetes mellitus ([place])   . Arteriosclerotic coronary artery disease   . Peyronie's disease   . Prostatitis   . Benign essential tremor   . Hyperlipidemia   . Bladder diverticulum   . Melanoma ([place])     HOSPITAL COURSE:   1.  Acute cystitis with hematuria and fever. Patient received 3 days of IV Rocephin while here in the hospital. I will switch over to 4 more days of by mouth Keflex. Unfortunately urine culture still incubating at this time. Blood cultures are negative so far. Temperature curve has come down. After the course of Keflex he can restart his prophylactic doxycycline. 2. Relative hypotension. I stopped his blood pressure medications and I gave IV fluids during the hospital course 3. Weakness. Physical therapy recommended home with home health. Home health set up yesterday. Rolling walker. 4. Hyponatremia. This is a combination of dehydration and taking hydrochlorothiazide. Sodium improved to 132 while here. 5. Metastatic melanoma. He has an appointment today for immunotherapy at [facility]. I will discharge him this morning so we can make that appointment. 6. Hyperlipidemia unspecified. Because of the weakness I will get rid of atorvastatin 7. Anemia of chronic disease. Stop  aspirin. Hemoglobin yesterday 8 8. Type 2 diabetes mellitus continue sliding scale insulin as he normally takes 9. BPH on Flomax 10. Fever blisters I gave 2 doses of valacyclovir 11. Nonhealing ulcer left heel  DISCHARGE CONDITIONS:   Fair  CONSULTS OBTAINED:  None  DRUG ALLERGIES:   Allergies  Allergen Reactions  . Ciprofloxacin Other (See Comments)    Rigors  . Codeine     DISCHARGE MEDICATIONS:   Current Discharge Medication List    START taking these medications   Details  cephALEXin (KEFLEX) 500 MG capsule Take 1 capsule (500 mg total) by mouth 3 (three) times daily. Qty: 12 capsule, Refills: 0    ondansetron (ZOFRAN) 4 MG tablet Take 1 tablet (4 mg total) by mouth every 6 (six) hours as needed for nausea. Qty: 20 tablet, Refills: 0    oxyCODONE (OXY IR/ROXICODONE) 5 MG immediate release tablet Take 1 tablet (5 mg total) by mouth every 4 (four) hours as needed for moderate pain. Qty: 30 tablet, Refills: 0      CONTINUE these medications which have NOT CHANGED   Details  cholecalciferol (VITAMIN D) 1000 UNITS tablet Take 1,000 Units by mouth daily.      Cyanocobalamin (B-12) 100 MCG TABS Take 1 tablet by mouth daily.      insulin lispro protamine-lispro (HUMALOG MIX 75/25) (75-25) 100 UNIT/ML SUSP injection Inject 30-35 Units into the skin 2 (two) times daily with a meal.     Insulin Syringe-Needle U-100 (INSULIN SYRINGE .3CC/31GX5/16") 31G X 5/16" 0.3 ML MISC USE 1 SYRINGE TWICE DAILY Qty:  100 each, Refills: 3    Lancets (ONETOUCH ULTRASOFT) lancets Use as instructed Qty: 100 each, Refills: 12    Magnesium 400 MG CAPS Take 1 capsule by mouth 2 (two) times daily.      primidone (MYSOLINE) 50 MG tablet Take 25 mg by mouth daily.    tamsulosin (FLOMAX) 0.4 MG CAPS capsule Take 0.4 mg by mouth daily.       STOP taking these medications     aspirin 81 MG tablet      ASTRAGALUS PO      atorvastatin (LIPITOR) 10 MG tablet      Biotin 1000 MCG tablet       Calcium Carbonate-Vitamin D 600-400 MG-UNIT tablet      Carnosine L POWD      Coenzyme Q10 (EQL COQ10) 300 MG CAPS      Digestive Enzymes (PAPAYA ENZYME PO)      Evening Primrose Oil 1000 MG CAPS      Ginger, Zingiber officinalis, (GINGER ROOT) 550 MG CAPS      Glucosamine-Chondroit-Vit C-Mn (GLUCOSAMINE 1500 COMPLEX) CAPS      Glutamine 500 MG CAPS      Glutathione-L POWD      Krill Oil 300 MG CAPS      milk thistle 175 MG tablet      NON FORMULARY      valsartan-hydrochlorothiazide (DIOVAN HCT) 80-12.5 MG tablet          DISCHARGE INSTRUCTIONS:   Follow-up at [facility] today for consideration of immunotherapy treatment Follow-up PMD in 1 week  If you experience worsening of your admission symptoms, develop shortness of breath, life threatening emergency, suicidal or homicidal thoughts you must seek medical attention immediately by calling 911 or calling your MD immediately  if symptoms less severe.  You Must read complete instructions/literature along with all the possible adverse reactions/side effects for all the Medicines you take and that have been prescribed to you. Take any new Medicines after you have completely understood and accept all the possible adverse reactions/side effects.   Please note  You were cared for by a hospitalist during your hospital stay. If you have any questions about your discharge medications or the care you received while you were in the hospital after you are discharged, you can call the unit and asked to speak with the hospitalist on call if the hospitalist that took care of you is not available. Once you are discharged, your primary care physician will handle any further medical issues. Please note that NO REFILLS for any discharge medications will be authorized once you are discharged, as it is imperative that you return to your primary care physician (or establish a relationship with a primary care physician if you do not have one) for  your aftercare needs so that they can reassess your need for medications and monitor your lab values.    Today   CHIEF COMPLAINT:   Chief Complaint  Patient presents with  . Weakness    HISTORY OF PRESENT ILLNESS:  [name] [name]  is a 75 y.o. male with a known history of Metastatic melanoma presents with weakness and found to have a urinary tract infection and fever   VITAL SIGNS:  Blood pressure 115/57, pulse 83, temperature 98.1 F (36.7 C), temperature source Oral, resp. rate 17, height 5\' 11"  (1.803 m), weight 85.186 kg (187 lb 12.8 oz), SpO2 100 %.    PHYSICAL EXAMINATION:  GENERAL:  75 y.o.-year-old patient lying in the bed with no  acute distress.  EYES: Pupils equal, round, reactive to light and accommodation. No scleral icterus. Extraocular muscles intact.  HEENT: Head atraumatic, normocephalic. Oropharynx and nasopharynx clear.  NECK:  Supple, no jugular venous distention. No thyroid enlargement, no tenderness.  LUNGS: Normal breath sounds bilaterally, no wheezing, rales,rhonchi or crepitation. No use of accessory muscles of respiration.  CARDIOVASCULAR: S1, S2 normal. No murmurs, rubs, or gallops.  ABDOMEN: Soft, non-tender, non-distended. Bowel sounds present. No organomegaly or mass.  EXTREMITIES: No pedal edema, cyanosis, or clubbing.  NEUROLOGIC: Cranial nerves II through XII are intact. Muscle strength 5/5 in all extremities. Sensation intact. Gait not checked.  PSYCHIATRIC: The patient is alert and oriented x 3.  SKIN: Small nonhealing ulcer left heel  DATA REVIEW:   CBC  Recent Labs Lab 05/09/16 0406  WBC 8.6  HGB 8.0*  HCT 24.1*  PLT 288    Chemistries   Recent Labs Lab 05/09/16 1019 05/10/16 0406  NA  --  132*  K  --  4.2  CL  --  104  CO2  --  23  GLUCOSE  --  71  BUN  --  32*  CREATININE  --  0.96  CALCIUM  --  8.0*  AST 105*  --   ALT 62  --   ALKPHOS 191*  --   BILITOT 0.5  --     Cardiac Enzymes  Recent Labs Lab  05/07/16 2115  TROPONINI <0.03    Microbiology Results  Results for orders placed or performed during the hospital encounter of 05/07/16  Urine culture     Status: None (Preliminary result)   Collection Time: 05/07/16  9:37 PM  Result Value Ref Range Status   Specimen Description URINE, CLEAN CATCH  Final   Special Requests NONE  Final   Culture   Final    CULTURE REINCUBATED FOR BETTER GROWTH Performed at [facility]    Report Status PENDING  Incomplete  Culture, blood (routine x 2)     Status: None (Preliminary result)   Collection Time: 05/07/16 11:04 PM  Result Value Ref Range Status   Specimen Description BLOOD FEMORAL ARTERY  Final   Special Requests BOTTLES DRAWN AEROBIC AND ANAEROBIC 8CC  Final   Culture NO GROWTH 2 DAYS  Final   Report Status PENDING  Incomplete  Culture, blood (routine x 2)     Status: None (Preliminary result)   Collection Time: 05/07/16 11:30 PM  Result Value Ref Range Status   Specimen Description BLOOD FEMORAL ARTERY  Final   Special Requests BOTTLES DRAWN AEROBIC AND ANAEROBIC 6CC  Final   Culture NO GROWTH 2 DAYS  Final   Report Status PENDING  Incomplete    Management plans discussed with the patient, family and they are in agreement.  CODE STATUS:     Code Status Orders        Start     Ordered   05/08/16 0104  Full code   Continuous     05/08/16 0103    Code Status History    Date Active Date Inactive Code Status Order ID Comments User Context   This patient has a current code status but no historical code status.      TOTAL TIME TAKING CARE OF THIS PATIENT: 32 minutes.    Loletha Grayer M.D on 05/10/2016 at 7:12 AM  Between 7am to 6pm - Pager - [phone]  After 6pm go to [email or web address] - Clinical cytogeneticist  [phone]  CC: Primary care physician; Coral Spikes, DO

## 2016-05-10 NOTE — Care Management (Signed)
Discharge to home today per Dr. Leslye Peer. Will be followed by [facility] for nursing, physical therapy, and nursing assistant. Family will transport [name] Ammons RN MSN CCM Care Management [phone]

## 2016-05-10 NOTE — Progress Notes (Signed)
Discussed discharge instructions and medications with pt and his wife.  No questions at this time.  IVs removed.  Pt transported home via car by wife and daughter.  Clarise Cruz, RN

## 2016-05-11 ENCOUNTER — Telehealth: Payer: Self-pay

## 2016-05-11 NOTE — Telephone Encounter (Signed)
Transition Care Management Follow-up Telephone Call   Date discharged? 05/10/16   How have you been since you were released from the hospital? No pain. Output measures intake of fluids. Still some weakness, but feeling a little bit better.     Do you understand why you were in the hospital? YES, weakness and UTI.   Do you understand the discharge instructions? YES, increase activity as tolerated.  Drink plenty of fluids.     Where were you discharged to? HOME.   Items Reviewed:  Medications reviewed: YES, STARTED KEFLEX 500mg , ZOFRAN 4mg , oxyCODONE 5mg  without issues. STOPPED taking aspirin 81mg , Astragalus PO, Lipitor 10mg , Biotin 1000mg , Calcium carbonate VIT D 600-400mg , Carnosine LPOWD, Coenzyme Q10 300mg , Papaya enzyme PO, Evening Primrose Oil 1000mg , Ginger Root 550mg , Glucosamine 1500 complex caps, Glutamine 500mg , Glutathione L POWD, Krill Oil 300mg , Milk thistle 175mg , Diovan HCT 80-12.5mg .  Continuing all other medications which have not changed VIT D 1000 U, B-12 100mg , Linsulin Humalog mix 75/25 100 U/ml  Allergies reviewed: YES, no changes.  Dietary changes reviewed: YES, regular diet, no problems.  Referrals reviewed: YES, Immunotherapy treatment scheduled with [facility] and follow up scheduled with PCP.    Functional Questionnaire:   Activities of Daily Living (ADLs):   He states they are independent in the following: Bathing, dressing, toileting, self feeding. States they require assistance with the following: Ambulating (rolling walker/cane in use), meal prep. Wife assists.   Any transportation issues/concerns?: NO.   Any patient concerns? YES, constipation.  He would like to discuss something to help regiment his BM.  C/O SOB with bathing and dressing.  Awaiting HH PT assistance.   Confirmed importance and date/time of follow-up visits scheduled YES, appointment scheduled 05/17/16 at 3:00.  Provider Appointment booked with Dr. Lacinda Axon (PCP).  Confirmed with  patient if condition begins to worsen call PCP or go to the ER.  Patient was given the office number and encouraged to call back with question or concerns.  : YES, patient verbalized understanding.

## 2016-05-11 NOTE — Progress Notes (Signed)
Patient ID: [name] [name], male   DOB: 11/27/1941, 75 y.o.   MRN: [id number]  Spoke with wife. Patient doing better. Stop kelfex Few days of augmentin called in for enterococcus in UC called into pharmacy [phone]  Dr Leslye Peer

## 2016-05-12 ENCOUNTER — Encounter: Payer: Self-pay | Admitting: Family Medicine

## 2016-05-12 ENCOUNTER — Ambulatory Visit (INDEPENDENT_AMBULATORY_CARE_PROVIDER_SITE_OTHER): Payer: Medicare Other | Admitting: Family Medicine

## 2016-05-12 ENCOUNTER — Ambulatory Visit: Payer: BLUE CROSS/BLUE SHIELD | Admitting: Family Medicine

## 2016-05-12 VITALS — BP 112/67 | HR 99 | Temp 98.1°F | Ht 70.5 in | Wt 188.4 lb

## 2016-05-12 DIAGNOSIS — R531 Weakness: Secondary | ICD-10-CM | POA: Diagnosis not present

## 2016-05-12 DIAGNOSIS — N390 Urinary tract infection, site not specified: Secondary | ICD-10-CM | POA: Diagnosis not present

## 2016-05-12 LAB — CULTURE, BLOOD (ROUTINE X 2)
CULTURE: NO GROWTH
Culture: NO GROWTH

## 2016-05-12 NOTE — Patient Instructions (Addendum)
Go ahead over to the Emergency Room. Address: [address], [place], [place] [address]   Have the ED doctor contact your oncologist.  I will be in touch and will follow you course via the chart.  Take care  Dr. Lacinda Axon

## 2016-05-12 NOTE — Progress Notes (Signed)
Pre visit review using our clinic review tool, if applicable. No additional management support is needed unless otherwise documented below in the visit note. 

## 2016-05-15 NOTE — Assessment & Plan Note (Signed)
Patient recently admitted for weakness and UTI. Culture revealed Enterococcus. Asymptomatic and doing well at this time.

## 2016-05-15 NOTE — Progress Notes (Signed)
Subjective:  Patient ID: [name] [name], male    DOB: 07-04-1941  Age: 75 y.o. MRN: [id number]  CC: Hospital follow up  HPI:  75 year old male with a complicated past medical history including newly diagnosed metastatic melanoma presents for hospital follow-up.  Hospital course summarized as below: Patient presented on 5/28. The day prior he went to ED for the same symptoms and was found to be anemic. He was transfused 1 unit PRBC's.  He presented again with worsening weakness. Urinalysis was suggestive of UTI. Given his symptoms, he was admitted for antibiotics and further observation. Patient was treated with IV Rocephin. Urine culture returned with enterococcus, but only 10,000 CFU (not likely the source of his symptoms). He was also given IV fluids. Anemia remained stable. Discharged home on 5/31.  Patient presents today for follow-up. He is accompanied by his wife. He continues to report diffuse weakness and fatigue. His wife states that he's had difficulty ambulating and getting out of bed. She states that he often falls asleep. No recent fevers or chills. No reports of chest pain or shortness of breath. He states that his symptoms are severe.  Social Hx   Social History   Social History  . Marital Status: Married    Spouse Name: N/A  . Number of Children: N/A  . Years of Education: 16   Occupational History  . Merchant    Social History Main Topics  . Smoking status: Never Smoker   . Smokeless tobacco: None  . Alcohol Use: Yes  . Drug Use: No  . Sexual Activity: Not Asked   Other Topics Concern  . None   Social History Narrative   Regular exercise-yes   Review of Systems  Constitutional: Positive for fatigue. Negative for fever and chills.  Respiratory: Negative.   Cardiovascular: Negative.    Objective:  BP 112/67 mmHg  Pulse 99  Temp(Src) 98.1 F (36.7 C) (Oral)  Ht 5' 10.5" (1.791 m)  Wt 188 lb 6 oz (85.446 kg)  BMI 26.64 kg/m2  SpO2 95%  BP/Weight  05/12/2016 05/10/2016 [id number]  Systolic BP [id number] -  Diastolic BP 67 57 -  Wt. (Lbs) 188.38 - 187.8  BMI 26.64 - 26.2   Physical Exam  Constitutional:  Appears fatigued.   HENT:  Head: Normocephalic and atraumatic.  Cardiovascular: Normal rate and regular rhythm.   Pulmonary/Chest: Effort normal. He has no wheezes. He has no rales.  Neurological:  Alert. Answers questions appropriately   Skin:  Appears pale.  Psychiatric:  Flat affect, depressed mood.  Vitals reviewed.  Lab Results  Component Value Date   WBC 8.6 05/09/2016   HGB 8.0* 05/09/2016   HCT 24.1* 05/09/2016   PLT 288 05/09/2016   GLUCOSE 71 05/10/2016   CHOL 139 01/24/2016   TRIG 110.0 01/24/2016   HDL 27.40* 01/24/2016   LDLCALC 90 01/24/2016   ALT 62 05/09/2016   AST 105* 05/09/2016   NA 132* 05/10/2016   K 4.2 05/10/2016   CL 104 05/10/2016   CREATININE 0.96 05/10/2016   BUN 32* 05/10/2016   CO2 23 05/10/2016   TSH 2.305 05/09/2016   HGBA1C 6.4 01/24/2016   MICROALBUR 1.3 01/24/2016   Assessment & Plan:   Problem List Items Addressed This Visit    UTI (lower urinary tract infection) - Primary    Patient recently admitted for weakness and UTI. Culture revealed Enterococcus. Asymptomatic and doing well at this time.      Weakness    Patient  continues to have severe weakness and lethargy. I discussed his case with his oncologist, Dr. Raynelle Chary. We are both in agreement that he should go to the emergency room for evaluation and will likely be admitted to her service as she is on-call this weekend.        Follow-up: TBD after visit to [address]

## 2016-05-15 NOTE — Assessment & Plan Note (Signed)
Patient continues to have severe weakness and lethargy. I discussed his case with his oncologist, Dr. Raynelle Chary. We are both in agreement that he should go to the emergency room for evaluation and will likely be admitted to her service as she is on-call this weekend.

## 2016-06-06 ENCOUNTER — Encounter: Payer: Self-pay | Admitting: Intensive Care

## 2016-06-06 ENCOUNTER — Inpatient Hospital Stay
Admission: EM | Admit: 2016-06-06 | Discharge: 2016-06-08 | DRG: 180 | Disposition: A | Discharge status: Home / Self Care | Payer: Medicare Other | Attending: Internal Medicine | Admitting: Internal Medicine

## 2016-06-06 ENCOUNTER — Emergency Department: Payer: Medicare Other

## 2016-06-06 DIAGNOSIS — Z833 Family history of diabetes mellitus: Secondary | ICD-10-CM

## 2016-06-06 DIAGNOSIS — Z79899 Other long term (current) drug therapy: Secondary | ICD-10-CM | POA: Diagnosis not present

## 2016-06-06 DIAGNOSIS — Z9049 Acquired absence of other specified parts of digestive tract: Secondary | ICD-10-CM | POA: Diagnosis not present

## 2016-06-06 DIAGNOSIS — D696 Thrombocytopenia, unspecified: Secondary | ICD-10-CM | POA: Diagnosis present

## 2016-06-06 DIAGNOSIS — R0902 Hypoxemia: Secondary | ICD-10-CM | POA: Diagnosis present

## 2016-06-06 DIAGNOSIS — C7952 Secondary malignant neoplasm of bone marrow: Secondary | ICD-10-CM | POA: Diagnosis present

## 2016-06-06 DIAGNOSIS — R0600 Dyspnea, unspecified: Secondary | ICD-10-CM

## 2016-06-06 DIAGNOSIS — C782 Secondary malignant neoplasm of pleura: Principal | ICD-10-CM | POA: Diagnosis present

## 2016-06-06 DIAGNOSIS — E860 Dehydration: Secondary | ICD-10-CM | POA: Diagnosis present

## 2016-06-06 DIAGNOSIS — C439 Malignant melanoma of skin, unspecified: Secondary | ICD-10-CM | POA: Diagnosis present

## 2016-06-06 DIAGNOSIS — Z794 Long term (current) use of insulin: Secondary | ICD-10-CM

## 2016-06-06 DIAGNOSIS — J810 Acute pulmonary edema: Secondary | ICD-10-CM | POA: Diagnosis not present

## 2016-06-06 DIAGNOSIS — C786 Secondary malignant neoplasm of retroperitoneum and peritoneum: Secondary | ICD-10-CM | POA: Diagnosis present

## 2016-06-06 DIAGNOSIS — N400 Enlarged prostate without lower urinary tract symptoms: Secondary | ICD-10-CM | POA: Diagnosis present

## 2016-06-06 DIAGNOSIS — E871 Hypo-osmolality and hyponatremia: Secondary | ICD-10-CM | POA: Diagnosis present

## 2016-06-06 DIAGNOSIS — J9601 Acute respiratory failure with hypoxia: Secondary | ICD-10-CM | POA: Diagnosis present

## 2016-06-06 DIAGNOSIS — I10 Essential (primary) hypertension: Secondary | ICD-10-CM | POA: Diagnosis present

## 2016-06-06 DIAGNOSIS — Z888 Allergy status to other drugs, medicaments and biological substances status: Secondary | ICD-10-CM

## 2016-06-06 DIAGNOSIS — J910 Malignant pleural effusion: Secondary | ICD-10-CM | POA: Diagnosis present

## 2016-06-06 DIAGNOSIS — C7951 Secondary malignant neoplasm of bone: Secondary | ICD-10-CM | POA: Diagnosis not present

## 2016-06-06 DIAGNOSIS — Z8551 Personal history of malignant neoplasm of bladder: Secondary | ICD-10-CM

## 2016-06-06 DIAGNOSIS — J90 Pleural effusion, not elsewhere classified: Secondary | ICD-10-CM | POA: Diagnosis not present

## 2016-06-06 DIAGNOSIS — E785 Hyperlipidemia, unspecified: Secondary | ICD-10-CM | POA: Diagnosis present

## 2016-06-06 DIAGNOSIS — Z886 Allergy status to analgesic agent status: Secondary | ICD-10-CM | POA: Diagnosis not present

## 2016-06-06 DIAGNOSIS — R0602 Shortness of breath: Secondary | ICD-10-CM

## 2016-06-06 DIAGNOSIS — D638 Anemia in other chronic diseases classified elsewhere: Secondary | ICD-10-CM | POA: Diagnosis present

## 2016-06-06 DIAGNOSIS — I2510 Atherosclerotic heart disease of native coronary artery without angina pectoris: Secondary | ICD-10-CM | POA: Diagnosis present

## 2016-06-06 DIAGNOSIS — M549 Dorsalgia, unspecified: Secondary | ICD-10-CM

## 2016-06-06 DIAGNOSIS — Z8249 Family history of ischemic heart disease and other diseases of the circulatory system: Secondary | ICD-10-CM

## 2016-06-06 DIAGNOSIS — Z8261 Family history of arthritis: Secondary | ICD-10-CM

## 2016-06-06 DIAGNOSIS — Z951 Presence of aortocoronary bypass graft: Secondary | ICD-10-CM

## 2016-06-06 DIAGNOSIS — C779 Secondary and unspecified malignant neoplasm of lymph node, unspecified: Secondary | ICD-10-CM | POA: Diagnosis not present

## 2016-06-06 DIAGNOSIS — E119 Type 2 diabetes mellitus without complications: Secondary | ICD-10-CM | POA: Diagnosis present

## 2016-06-06 DIAGNOSIS — C7911 Secondary malignant neoplasm of bladder: Secondary | ICD-10-CM | POA: Diagnosis not present

## 2016-06-06 LAB — TYPE AND SCREEN
ABO/RH(D): O POS
Antibody Screen: NEGATIVE

## 2016-06-06 LAB — CBC
HCT: 28.7 % — ABNORMAL LOW (ref 40.0–52.0)
Hemoglobin: 9.7 g/dL — ABNORMAL LOW (ref 13.0–18.0)
MCH: 28.6 pg (ref 26.0–34.0)
MCHC: 33.7 g/dL (ref 32.0–36.0)
MCV: 84.9 fL (ref 80.0–100.0)
PLATELETS: 64 10*3/uL — AB (ref 150–440)
RBC: 3.38 MIL/uL — ABNORMAL LOW (ref 4.40–5.90)
RDW: 17.9 % — AB (ref 11.5–14.5)
WBC: 5.3 10*3/uL (ref 3.8–10.6)

## 2016-06-06 LAB — COMPREHENSIVE METABOLIC PANEL
ALBUMIN: 2.1 g/dL — AB (ref 3.5–5.0)
ALT: 15 U/L — ABNORMAL LOW (ref 17–63)
ANION GAP: 12 (ref 5–15)
AST: 51 U/L — ABNORMAL HIGH (ref 15–41)
Alkaline Phosphatase: 137 U/L — ABNORMAL HIGH (ref 38–126)
BILIRUBIN TOTAL: 1.1 mg/dL (ref 0.3–1.2)
BUN: 29 mg/dL — ABNORMAL HIGH (ref 6–20)
CALCIUM: 7.8 mg/dL — AB (ref 8.9–10.3)
CO2: 24 mmol/L (ref 22–32)
Chloride: 95 mmol/L — ABNORMAL LOW (ref 101–111)
Creatinine, Ser: 0.93 mg/dL (ref 0.61–1.24)
GLUCOSE: 156 mg/dL — AB (ref 65–99)
POTASSIUM: 3.8 mmol/L (ref 3.5–5.1)
Sodium: 131 mmol/L — ABNORMAL LOW (ref 135–145)
TOTAL PROTEIN: 4.8 g/dL — AB (ref 6.5–8.1)

## 2016-06-06 LAB — GLUCOSE, CAPILLARY
GLUCOSE-CAPILLARY: 124 mg/dL — AB (ref 65–99)
GLUCOSE-CAPILLARY: 145 mg/dL — AB (ref 65–99)

## 2016-06-06 LAB — TROPONIN I

## 2016-06-06 LAB — PROTIME-INR
INR: 1.3
PROTHROMBIN TIME: 16.3 s — AB (ref 11.4–15.0)

## 2016-06-06 LAB — LACTIC ACID, PLASMA: Lactic Acid, Venous: 1.9 mmol/L (ref 0.5–1.9)

## 2016-06-06 LAB — BRAIN NATRIURETIC PEPTIDE: B Natriuretic Peptide: 73 pg/mL (ref 0.0–100.0)

## 2016-06-06 MED ORDER — BISACODYL 5 MG PO TBEC: 5.0000 mg | DELAYED_RELEASE_TABLET | Freq: Every day | ORAL | Status: DC | PRN

## 2016-06-06 MED ORDER — ACETAMINOPHEN 650 MG RE SUPP: 650.0000 mg | Freq: Four times a day (QID) | RECTAL | Status: DC | PRN

## 2016-06-06 MED ORDER — SODIUM CHLORIDE 0.9 % IV SOLN: 250.0000 mL | INTRAVENOUS | Status: DC | PRN

## 2016-06-06 MED ORDER — POLYETHYLENE GLYCOL 3350 17 G PO PACK: 17.0000 g | PACK | Freq: Every day | ORAL | Status: DC | PRN

## 2016-06-06 MED ORDER — KETOROLAC TROMETHAMINE 30 MG/ML IJ SOLN
15.0000 mg | Freq: Four times a day (QID) | INTRAMUSCULAR | Status: DC | PRN
  Administered 2016-06-07 (×4): 15 mg via INTRAVENOUS
  Filled 2016-06-06 (×4): qty 1

## 2016-06-06 MED ORDER — FENTANYL CITRATE (PF) 100 MCG/2ML IJ SOLN
25.0000 ug | Freq: Once | INTRAMUSCULAR | Status: AC
  Administered 2016-06-06: 25 ug via INTRAVENOUS
  Filled 2016-06-06: qty 2

## 2016-06-06 MED ORDER — KETOROLAC TROMETHAMINE 30 MG/ML IJ SOLN
INTRAMUSCULAR | Status: AC
  Administered 2016-06-06: 15 mg
  Filled 2016-06-06: qty 1

## 2016-06-06 MED ORDER — ONDANSETRON HCL 4 MG/2ML IJ SOLN
INTRAMUSCULAR | Status: AC
  Administered 2016-06-06: 4 mg via INTRAVENOUS
  Filled 2016-06-06: qty 2

## 2016-06-06 MED ORDER — TAMSULOSIN HCL 0.4 MG PO CAPS
0.4000 mg | ORAL_CAPSULE | Freq: Every day | ORAL | Status: DC
  Administered 2016-06-07 – 2016-06-08 (×2): 0.4 mg via ORAL
  Filled 2016-06-06 (×2): qty 1

## 2016-06-06 MED ORDER — ATORVASTATIN CALCIUM 20 MG PO TABS: 10.0000 mg | ORAL_TABLET | Freq: Every day | ORAL | Status: DC

## 2016-06-06 MED ORDER — ACETAMINOPHEN 325 MG PO TABS
650.0000 mg | ORAL_TABLET | Freq: Four times a day (QID) | ORAL | Status: DC | PRN
  Administered 2016-06-06: 650 mg via ORAL
  Filled 2016-06-06: qty 2

## 2016-06-06 MED ORDER — NAPROXEN 250 MG PO TABS
375.0000 mg | ORAL_TABLET | Freq: Three times a day (TID) | ORAL | Status: DC | PRN
  Administered 2016-06-06 – 2016-06-08 (×3): 375 mg via ORAL
  Filled 2016-06-06 (×4): qty 2

## 2016-06-06 MED ORDER — POTASSIUM CHLORIDE IN NACL 20-0.9 MEQ/L-% IV SOLN
INTRAVENOUS | Status: DC
  Administered 2016-06-06: 23:00:00 via INTRAVENOUS
  Filled 2016-06-06: qty 1000

## 2016-06-06 MED ORDER — IOPAMIDOL (ISOVUE-370) INJECTION 76%
100.0000 mL | Freq: Once | INTRAVENOUS | Status: AC | PRN
  Administered 2016-06-06: 100 mL via INTRAVENOUS

## 2016-06-06 MED ORDER — ONDANSETRON HCL 4 MG PO TABS: 4.0000 mg | ORAL_TABLET | Freq: Four times a day (QID) | ORAL | Status: DC | PRN

## 2016-06-06 MED ORDER — SODIUM CHLORIDE 0.9 % IV BOLUS (SEPSIS)
500.0000 mL | Freq: Once | INTRAVENOUS | Status: AC
  Administered 2016-06-06: 500 mL via INTRAVENOUS

## 2016-06-06 MED ORDER — ONDANSETRON HCL 4 MG/2ML IJ SOLN
4.0000 mg | Freq: Four times a day (QID) | INTRAMUSCULAR | Status: DC | PRN
  Administered 2016-06-06: 4 mg via INTRAVENOUS

## 2016-06-06 MED ORDER — PRIMIDONE 50 MG PO TABS
50.0000 mg | ORAL_TABLET | Freq: Every day | ORAL | Status: DC
  Administered 2016-06-07 – 2016-06-08 (×2): 50 mg via ORAL
  Filled 2016-06-06 (×4): qty 1

## 2016-06-06 MED ORDER — ENOXAPARIN SODIUM 40 MG/0.4ML ~~LOC~~ SOLN: 40.0000 mg | SUBCUTANEOUS | Status: DC

## 2016-06-06 MED ORDER — INSULIN ASPART 100 UNIT/ML ~~LOC~~ SOLN
0.0000 [IU] | Freq: Three times a day (TID) | SUBCUTANEOUS | Status: DC
  Administered 2016-06-07: 3 [IU] via SUBCUTANEOUS
  Administered 2016-06-07: 2 [IU] via SUBCUTANEOUS
  Administered 2016-06-07: 1 [IU] via SUBCUTANEOUS
  Administered 2016-06-08: 2 [IU] via SUBCUTANEOUS
  Filled 2016-06-06: qty 3
  Filled 2016-06-06 (×2): qty 2
  Filled 2016-06-06: qty 1

## 2016-06-06 MED ORDER — INSULIN ASPART PROT & ASPART (70-30 MIX) 100 UNIT/ML ~~LOC~~ SUSP
14.0000 [IU] | Freq: Two times a day (BID) | SUBCUTANEOUS | Status: DC
  Administered 2016-06-07 – 2016-06-08 (×3): 14 [IU] via SUBCUTANEOUS
  Filled 2016-06-06 (×3): qty 14

## 2016-06-06 MED ORDER — ALBUTEROL SULFATE (2.5 MG/3ML) 0.083% IN NEBU: 2.5000 mg | INHALATION_SOLUTION | RESPIRATORY_TRACT | Status: DC | PRN

## 2016-06-06 MED ORDER — DOCUSATE SODIUM 100 MG PO CAPS
100.0000 mg | ORAL_CAPSULE | Freq: Two times a day (BID) | ORAL | Status: DC
  Administered 2016-06-07 – 2016-06-08 (×3): 100 mg via ORAL
  Filled 2016-06-06 (×3): qty 1

## 2016-06-06 MED ORDER — SODIUM CHLORIDE 0.9% FLUSH
3.0000 mL | Freq: Two times a day (BID) | INTRAVENOUS | Status: DC
  Administered 2016-06-06 – 2016-06-08 (×4): 3 mL via INTRAVENOUS

## 2016-06-06 MED ORDER — SODIUM CHLORIDE 0.9% FLUSH: 3.0000 mL | INTRAVENOUS | Status: DC | PRN

## 2016-06-06 NOTE — ED Provider Notes (Signed)
[facility] Emergency Department Provider Note  ____________________________________________   I have reviewed the triage vital signs and the nursing notes.   HISTORY  Chief Complaint Shortness of Breath    HPI [name] [name] is a 75 y.o. male presents today complaining of shortness of breath. Patient states he feels too weak to breathe. He has not had a fever or significant cough. Denies productive cough. States. Months he has had an occasional dry hacking cough. Patient has a history of diabetes, CAD with a bypass in 47, history of CHF. 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 He has had to have several blood transfusions for symptoms similar to this. He denies any melena bright red blood per rectum. He denies any chest pain. He has chronic lower extremity swelling, left greater than right but has had numerous DVT studies to rule out DVT in that extremity in that swelling has been present for 20 years since his bypass surgery. He denies any change in that. He does wear compression stockings. He is not on any diuretics. Patient does not have a history of PE or DVT. Patient states that over the last several months she's been getting gradually weaker, but his weakness and shortness of breath and acutely worse over the last 2 days.     Past Medical History  Diagnosis Date  . Insulin dependent diabetes mellitus ([place])   . Arteriosclerotic coronary artery disease   . Peyronie's disease   . Prostatitis   . Benign essential tremor   . Hyperlipidemia   . Bladder diverticulum   . Melanoma [facility])     Patient Active Problem List   Diagnosis Date Noted  . UTI (lower urinary tract infection) 05/07/2016  . Weakness 05/07/2016  . Melanoma ([place]) 05/07/2016  . Diabetes mellitus ([place]) 05/07/2016  . Metastatic melanoma ([place]) 04/25/2016  . Prostate cancer ([place]) 01/12/2016  . Venous  insufficiency of leg 09/23/2015  . CAD (coronary artery disease) 06/30/2015  . Essential hypertension 06/30/2015  . Essential tremor 06/30/2015  . Preventative health care 06/30/2015  . Carotid artery disease ([place]) 10/02/2014  . Hyperlipidemia   . Type 2 diabetes mellitus with complications [facility])     Past Surgical History  Procedure Laterality Date  . Cholecystectomy  1973  . Coronary artery bypass graft  04/2010     x6, Done at [facility]    Current Outpatient Rx  Name  Route  Sig  Dispense  Refill  . cephALEXin (KEFLEX) 500 MG capsule   Oral   Take 1 capsule (500 mg total) by mouth 3 (three) times daily.   12 capsule   0   . cholecalciferol (VITAMIN D) 1000 UNITS tablet   Oral   Take 1,000 Units by mouth daily.           . Cyanocobalamin (B-12) 100 MCG TABS   Oral   Take 1 tablet by mouth daily.           . insulin lispro protamine-lispro (HUMALOG MIX 75/25) (75-25) 100 UNIT/ML SUSP injection   Subcutaneous   Inject 30-35 Units into the skin 2 (two) times daily with a meal.          . Insulin Syringe-Needle U-100 (INSULIN SYRINGE .3CC/31GX5/16") 31G X 5/16" 0.3 ML MISC      USE 1 SYRINGE TWICE DAILY   100 each   3   . Lancets (ONETOUCH ULTRASOFT) lancets  Use as instructed   100 each   12   . Magnesium 400 MG CAPS   Oral   Take 1 capsule by mouth 2 (two) times daily.           . ondansetron (ZOFRAN) 4 MG tablet   Oral   Take 1 tablet (4 mg total) by mouth every 6 (six) hours as needed for nausea.   20 tablet   0   . oxyCODONE (OXY IR/ROXICODONE) 5 MG immediate release tablet   Oral   Take 1 tablet (5 mg total) by mouth every 4 (four) hours as needed for moderate pain.   30 tablet   0   . primidone (MYSOLINE) 50 MG tablet   Oral   Take 25 mg by mouth daily.         . tamsulosin (FLOMAX) 0.4 MG CAPS capsule   Oral   Take 0.4 mg by mouth daily.            Allergies Ciprofloxacin and Codeine  Family History  Problem Relation Age  of Onset  . Arthritis Mother   . Heart disease Mother   . Heart attack Mother   . Diabetes Sister   . Heart disease Brother   . Diabetes Brother     Social History Social History  Substance Use Topics  . Smoking status: Never Smoker   . Smokeless tobacco: Never Used  . Alcohol Use: Yes    Review of Systems Constitutional: No fever/chills Eyes: No visual changes. ENT: No sore throat. No stiff neck no neck pain Cardiovascular: Denies chest pain. Respiratory: Positive shortness of breath. Gastrointestinal:   no vomiting.  No diarrhea.  No constipation. Genitourinary: Negative for dysuria. Musculoskeletal: Positive lower extremity swelling Skin: Negative for rash. Neurological: Negative for headaches, focal weakness or numbness. 10-point ROS otherwise negative.  ____________________________________________   PHYSICAL EXAM:  VITAL SIGNS: ED Triage Vitals  Enc Vitals Group     BP 06/06/16 1530 114/62 mmHg     Pulse Rate 06/06/16 1530 89     Resp 06/06/16 1530 32     Temp 06/06/16 1530 97.4 F (36.3 C)     Temp Source 06/06/16 1530 Oral     SpO2 06/06/16 1530 100 %     Weight 06/06/16 1530 182 lb (82.555 kg)     Height 06/06/16 1530 5' 10.5" (1.791 m)     Head Cir --      Peak Flow --      Pain Score 06/06/16 1532 3     Pain Loc --      Pain Edu? --      Excl. in [place]? --     Constitutional: Alert and oriented. he appears chronically very weak. Eyes: Conjunctivae are pale. PERRL. EOMI. Head: Atraumatic. Nose: No congestion/rhinnorhea. Mouth/Throat: Mucous membranes are moist.  Oropharynx non-erythematous. Neck: No stridor.   Nontender with no meningismus Cardiovascular: Normal rate, regular rhythm. Grossly normal heart sounds.  Good peripheral circulation. Respiratory: Normal respiratory effort.  No retractions. Lungs CTAB. Abdominal: Soft and nontender. No distention. No guarding no rebound Back:  There is no focal tenderness or step off there is no midline  tenderness there are no lesions noted. there is no CVA tenderness Musculoskeletal: No lower extremity tenderness. No joint effusions, no DVT signs strong distal pulses significant bilateral lower extremity pitting edema left greater than right edema Neurologic:  Normal speech and language. No gross focal neurologic deficits are appreciated.  Skin:  Skin is warm,  dry and intact. No rash noted. Psychiatric: Mood and affect are normal. Speech and behavior are normal.  ____________________________________________   LABS (all labs ordered are listed, but only abnormal results are displayed)  Labs Reviewed  CULTURE, BLOOD (ROUTINE X 2)  CULTURE, BLOOD (ROUTINE X 2)  CBC  COMPREHENSIVE METABOLIC PANEL  URINALYSIS COMPLETEWITH MICROSCOPIC ([facility] ONLY)  TROPONIN I  LACTIC ACID, PLASMA  LACTIC ACID, PLASMA  PROTIME-INR  BRAIN NATRIURETIC PEPTIDE  CBC  CBG MONITORING, ED  TYPE AND SCREEN   ____________________________________________  EKG  I personally interpreted any EKGs ordered by me or triage Sinus rhythm rate 92 bpm no acute ST elevation or acute ST depression, normal axis, unremarkable EKG ____________________________________________  RADIOLOGY  I reviewed any imaging ordered by me or triage that were performed during my shift and, if possible, patient and/or family made aware of any abnormal findings. ____________________________________________   PROCEDURES  Procedure(s) performed: None  Critical Care performed: None  ____________________________________________   INITIAL IMPRESSION / ASSESSMENT AND PLAN / ED COURSE  Pertinent labs & imaging results that were available during my care of the patient were reviewed by me and considered in my medical decision making (see chart for details).  Patient with evidence of anemia and acute on chronic weakness and shortness of breath in the context of immunotherapy for melanoma. Patient's last treatment was a week and a half  ago. He does not have a high fever, his lungs appear to be clearly I suspect this is secondary to Although multiple different other possible pathologies are certainly entertained including CHF. We'll start broad workup in terms of possible etiologies of this time I have low suspicion for PE given multiple other likely etiologies including anemia. We will reassess that there was his care progresses. He is in no acute distress, he is doing well on 2 L, and we will continue to observe him pending admission which I think will likely be indicated. ____________________________________________   FINAL CLINICAL IMPRESSION(S) / ED DIAGNOSES  Final diagnoses:  None      This chart was dictated using voice recognition software.  Despite best efforts to proofread,  errors can occur which can change meaning.     Schuyler Amor, MD 06/06/16 [phone]

## 2016-06-06 NOTE — ED Notes (Signed)
Patient transported to X-ray 

## 2016-06-06 NOTE — H&P (Signed)
[place] [facility] [place] NAME: [name] [name]    MR#:  [id number]  DATE OF BIRTH:  1941-10-22  DATE OF ADMISSION:  06/06/2016  PRIMARY CARE PHYSICIAN: Coral Spikes, DO   REQUESTING/REFERRING PHYSICIAN: Dr. Burlene Arnt  CHIEF COMPLAINT:   Chief Complaint  Patient presents with  . Shortness of Breath    HISTORY OF PRESENT ILLNESS:  [name] [name]  is a 75 y.o. male with a known history of Diabetes, metastatic melanoma presents to the emergency room complaining of weakness, shortness of breath and worsening back pain. She and has had weakness and back pain since being diagnosed with melanoma. He has noticed over the last 2 days worsening shortness of breath. Here he has been found to have acute hypoxic respiratory failure with saturations at 84% on room air while I was in the room. CT scan shows bilateral pleural effusions with left greater than right. 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 He is afebrile.  Patient is going thru immunotherapy therapy and has had 2 doses.  PAST MEDICAL HISTORY:   Past Medical History  Diagnosis Date  . Insulin dependent diabetes mellitus ([place])   . Arteriosclerotic coronary artery disease   . Peyronie's disease   . Prostatitis   . Benign essential tremor   . Hyperlipidemia   . Bladder diverticulum   . Melanoma ([place])     PAST SURGICAL HISTORY:   Past Surgical History  Procedure Laterality Date  . Cholecystectomy  1973  . Coronary artery bypass graft  04/2010     x6, Done at [place]:   Social History  Substance Use Topics  . Smoking status: Never Smoker   . Smokeless tobacco: Never Used  . Alcohol Use: Yes    FAMILY HISTORY:   Family History  Problem Relation Age of Onset  . Arthritis Mother   . Heart disease Mother   . Heart attack Mother   . Diabetes Sister   . Heart disease Brother   .  Diabetes Brother     DRUG ALLERGIES:   Allergies  Allergen Reactions  . Ciprofloxacin Nausea And Vomiting and Other (See Comments)    Reaction:  Shaking   . Codeine Other (See Comments)    Reaction:  Cramping     REVIEW OF SYSTEMS:   Review of Systems  Constitutional: Positive for malaise/fatigue. Negative for fever, chills and weight loss.  HENT: Negative for hearing loss and nosebleeds.   Eyes: Negative for blurred vision, double vision and pain.  Respiratory: Negative for cough, hemoptysis, sputum production, shortness of breath and wheezing.   Cardiovascular: Negative for chest pain, palpitations, orthopnea and leg swelling.  Gastrointestinal: Positive for nausea, vomiting and constipation. Negative for abdominal pain and diarrhea.  Genitourinary: Negative for dysuria and hematuria.  Musculoskeletal: Positive for myalgias and back pain. Negative for falls.  Skin: Negative for rash.  Neurological: Positive for dizziness and weakness. Negative for tremors, sensory change, speech change, focal weakness, seizures and headaches.  Endo/Heme/Allergies: Does not bruise/bleed easily.  Psychiatric/Behavioral: Negative for depression and memory loss. The patient is not nervous/anxious.     MEDICATIONS AT HOME:   Prior to Admission medications   Medication Sig Start Date End Date Taking? Authorizing Provider  atorvastatin (LIPITOR) 10 MG tablet Take 10 mg by mouth daily.   Yes Historical Provider, MD  cholecalciferol (VITAMIN D) 1000 UNITS tablet Take 1,000  Units by mouth daily.     Yes Historical Provider, MD  docusate sodium (COLACE) 100 MG capsule Take 100 mg by mouth daily.   Yes Historical Provider, MD  insulin lispro protamine-lispro (HUMALOG MIX 75/25) (75-25) 100 UNIT/ML SUSP injection Inject 20-30 Units into the skin 2 (two) times daily with a meal. Pt uses per sliding scale.   Yes Historical Provider, MD  nitrofurantoin, macrocrystal-monohydrate, (MACROBID) 100 MG capsule Take  100 mg by mouth daily.   Yes Historical Provider, MD  primidone (MYSOLINE) 50 MG tablet Take 50 mg by mouth daily.    Yes Historical Provider, MD  tamsulosin (FLOMAX) 0.4 MG CAPS capsule Take 0.4 mg by mouth daily after breakfast.    Yes Historical Provider, MD  valsartan-hydrochlorothiazide (DIOVAN-HCT) 80-12.5 MG tablet Take 1 tablet by mouth daily.   Yes Historical Provider, MD  vitamin B-12 (CYANOCOBALAMIN) 1000 MCG tablet Take 1,000 mcg by mouth daily.   Yes Historical Provider, MD  cephALEXin (KEFLEX) 500 MG capsule Take 1 capsule (500 mg total) by mouth 3 (three) times daily. Patient not taking: Reported on 06/06/2016 05/09/16   Loletha Grayer, MD     VITAL SIGNS:  Blood pressure 125/69, pulse 83, temperature 97.4 F (36.3 C), temperature source Oral, resp. rate 26, height 5' 10.5" (1.791 m), weight 82.555 kg (182 lb), SpO2 99 %.  PHYSICAL EXAMINATION:  Physical Exam  GENERAL:  75 y.o.-year-old patient lying in the bed with no acute distress.  EYES: Pupils equal, round, reactive to light and accommodation. No scleral icterus. Extraocular muscles intact.  HEENT: Head atraumatic, normocephalic. Oropharynx and nasopharynx clear. No oropharyngeal erythema, moist oral mucosa  NECK:  Supple, no jugular venous distention. No thyroid enlargement, no tenderness. Small nodule at the base of neck LUNGS: Increased work of breathing. Decreased air entry bilaterally. CARDIOVASCULAR: S1, S2 normal. No murmurs, rubs, or gallops.  ABDOMEN: Soft, nontender, nondistended. Bowel sounds present. No organomegaly or mass.  EXTREMITIES: No  cyanosis, or clubbing. + 2 pedal & radial pulses b/l.  Bilateral lower extremity edema NEUROLOGIC: Cranial nerves II through XII are intact. No focal Motor or sensory deficits appreciated b/l PSYCHIATRIC: The patient is alert and oriented x 3. Anxious SKIN: No obvious rash, lesion, or ulcer.  Chronic left foot ulcer  LABORATORY PANEL:   CBC  Recent Labs Lab  06/06/16 1524  WBC 5.3  HGB 9.7*  HCT 28.7*  PLT 64*   ------------------------------------------------------------------------------------------------------------------  Chemistries   Recent Labs Lab 06/06/16 1524  NA 131*  K 3.8  CL 95*  CO2 24  GLUCOSE 156*  BUN 29*  CREATININE 0.93  CALCIUM 7.8*  AST 51*  ALT 15*  ALKPHOS 137*  BILITOT 1.1   ------------------------------------------------------------------------------------------------------------------  Cardiac Enzymes  Recent Labs Lab 06/06/16 1524  TROPONINI <0.03   ------------------------------------------------------------------------------------------------------------------  RADIOLOGY:  Dg Chest 2 View  06/06/2016  CLINICAL DATA:  Weakness, shortness of Breath, metastatic melanoma EXAM: CHEST  2 VIEW COMPARISON:  05/07/2016 FINDINGS: Cardiomediastinal silhouette is stable. Status post median sternotomy. Linear atelectasis or scarring noted right lower lobe perihilar. There is small left pleural effusion left basilar atelectasis or infiltrate. No pulmonary edema. IMPRESSION: Status post CABG. Small left pleural effusion with left basilar atelectasis or infiltrate. Electronically Signed   By: Lahoma Crocker M.D.   On: 06/06/2016 16:40   Ct Angio Chest Pe W/cm &/or Wo Cm  06/06/2016  CLINICAL DATA:  Coughing and shortness of breath. Evaluate for pulmonary embolus. EXAM: CT ANGIOGRAPHY CHEST WITH CONTRAST TECHNIQUE: Multidetector  CT imaging of the chest was performed using the standard protocol during bolus administration of intravenous contrast. Multiplanar CT image reconstructions and MIPs were obtained to evaluate the vascular anatomy. CONTRAST:  100 mL of Isovue 370 COMPARISON:  CT scan of the chest January 11, 2016 and chest x-ray from today FINDINGS: The central airways are normal. No pneumothorax. There is a small right pleural effusion and moderate size left effusion, both with associated compressive  atelectasis. No pulmonary nodules, masses, or other focal infiltrates. Evaluation for pulmonary emboli is limited in the lower lobes due to compressive atelectasis and effusions. Within this limitation, no pulmonary emboli are identified. There are coronary artery calcifications. The thoracic aorta is non aneurysmal with no dissection. The heart size is unchanged. No pericardial effusion. There is adenopathy in the chest and abdomen. The adenopathy is diffuse. For example, there is a node just deep to the left pectoralis muscle on series 4, image 14 measuring 18 x 18 mm. Nodes in the left axilla and elsewhere in the left retro pectoral region are prominent but not enlarged by CT criteria. There is also a node at the midline of the base of neck on series 4, image 6 which is larger in the interval. Enlarging prevascular nodes are seen. Numerous newly enlarged nodes are seen in the epicardial fat best seen on series 4, images 112-115. A representative node on image 113 to the left of midline measures 13 by 16 mm. Numerous enlarged nodes are seen in the upper abdomen as well. A representative node in the fat superior to the right kidney on series 4, image 136 measures 23 by 19 mm. Visualized portions of the adrenal glands, abdominal aorta, stomach, and liver are normal. The spleen is enlarged and heterogeneous but only partially visualized. Increased attenuation is seen in the subcutaneous fat diffusely suggesting volume overload/ edema. Bilateral gynecomastia is noted. There is increased patchy attenuation in the bones, best seen in the spine. The ribs and inferior sternum appear to be involved as well. Review of the MIP images confirms the above findings. IMPRESSION: 1. No pulmonary emboli are identified. 2. Diffuse adenopathy in the chest and upper abdomen in addition to scattered sclerosis throughout the visualized bones is most consistent with metastatic disease. The patient has a history of melanoma and prostate  cancer. 3. Bilateral pleural effusions and atelectasis. 4. The spleen remains enlarged and is heterogeneous. The heterogeneity may be at least partially due to see arterial phase imaging. Electronically Signed   By: Dorise Bullion III M.D   On: 06/06/2016 17:22     IMPRESSION AND PLAN:   * Acute hypoxic respiratory failure due to bilateral pleural effusions We'll admit the patient. Start on oxygen. Nebulizers when necessary. No signs of infection. 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 We'll consult pulmonary for thoracentesis. May need thoracic surgery depending on his progress. Patient is full code.  * Weakness due to metastatic melanoma  * Dehydration. Patient will be started on IV fluids.  * Insulin-dependent diabetes mellitus Diabetic diet Novolin 70/30 at 14 units twice a day. This is half the dose that he uses at home. Sliding scale insulin.  * DVT prophylaxis with Lovenox  All the records are reviewed and case discussed with ED provider. Management plans discussed with the patient, family and they are in agreement.  CODE STATUS: FULL CODE  TOTAL TIME TAKING CARE OF THIS PATIENT: 40 minutes.   Hillary Bow R M.D on 06/06/2016 at 6:14 PM  Between 7am to 6pm -  Pager - [phone]  After 6pm go to [email or web address] - password [email or web address] [facility]  Office  [phone]  CC: Primary care physician; Coral Spikes, DO  Note: This dictation was prepared with Dragon dictation along with smaller phrase technology. Any transcriptional errors that result from this process are unintentional.

## 2016-06-06 NOTE — ED Notes (Signed)
Informed RN bed ready  [phone]

## 2016-06-06 NOTE — ED Notes (Signed)
Pt arrived by EMS from home. PT is currently being treated with immunotherapy at [facility] for Mel[facility]oma [facility]. Pt c/o SOB X1 day. Pt also c/o weakness and fatigue for weeks. Pt is A&O x4. Pt denies chest pain. Patient c/o pain in his lower back as a result of his cancer

## 2016-06-07 ENCOUNTER — Inpatient Hospital Stay: Payer: Medicare Other

## 2016-06-07 DIAGNOSIS — R0902 Hypoxemia: Secondary | ICD-10-CM

## 2016-06-07 DIAGNOSIS — C779 Secondary and unspecified malignant neoplasm of lymph node, unspecified: Secondary | ICD-10-CM

## 2016-06-07 DIAGNOSIS — J910 Malignant pleural effusion: Secondary | ICD-10-CM

## 2016-06-07 DIAGNOSIS — J810 Acute pulmonary edema: Secondary | ICD-10-CM

## 2016-06-07 DIAGNOSIS — J90 Pleural effusion, not elsewhere classified: Secondary | ICD-10-CM

## 2016-06-07 DIAGNOSIS — R0602 Shortness of breath: Secondary | ICD-10-CM

## 2016-06-07 DIAGNOSIS — R630 Anorexia: Secondary | ICD-10-CM

## 2016-06-07 DIAGNOSIS — R5383 Other fatigue: Secondary | ICD-10-CM

## 2016-06-07 DIAGNOSIS — D696 Thrombocytopenia, unspecified: Secondary | ICD-10-CM

## 2016-06-07 DIAGNOSIS — R05 Cough: Secondary | ICD-10-CM

## 2016-06-07 DIAGNOSIS — C439 Malignant melanoma of skin, unspecified: Secondary | ICD-10-CM

## 2016-06-07 DIAGNOSIS — C7911 Secondary malignant neoplasm of bladder: Secondary | ICD-10-CM

## 2016-06-07 DIAGNOSIS — M545 Low back pain: Secondary | ICD-10-CM

## 2016-06-07 DIAGNOSIS — C7951 Secondary malignant neoplasm of bone: Secondary | ICD-10-CM

## 2016-06-07 DIAGNOSIS — I2510 Atherosclerotic heart disease of native coronary artery without angina pectoris: Secondary | ICD-10-CM

## 2016-06-07 DIAGNOSIS — Z794 Long term (current) use of insulin: Secondary | ICD-10-CM

## 2016-06-07 DIAGNOSIS — E119 Type 2 diabetes mellitus without complications: Secondary | ICD-10-CM

## 2016-06-07 DIAGNOSIS — C7800 Secondary malignant neoplasm of unspecified lung: Secondary | ICD-10-CM

## 2016-06-07 DIAGNOSIS — D509 Iron deficiency anemia, unspecified: Secondary | ICD-10-CM

## 2016-06-07 DIAGNOSIS — N486 Induration penis plastica: Secondary | ICD-10-CM

## 2016-06-07 LAB — BASIC METABOLIC PANEL
ANION GAP: 8 (ref 5–15)
BUN: 30 mg/dL — ABNORMAL HIGH (ref 6–20)
CHLORIDE: 99 mmol/L — AB (ref 101–111)
CO2: 26 mmol/L (ref 22–32)
CREATININE: 0.89 mg/dL (ref 0.61–1.24)
Calcium: 7.6 mg/dL — ABNORMAL LOW (ref 8.9–10.3)
GFR calc non Af Amer: >60 mL/min (ref >60)
Glucose, Bld: 125 mg/dL — ABNORMAL HIGH (ref 65–99)
POTASSIUM: 4 mmol/L (ref 3.5–5.1)
Sodium: 133 mmol/L — ABNORMAL LOW (ref 135–145)

## 2016-06-07 LAB — URINALYSIS COMPLETE WITH MICROSCOPIC (ARMC ONLY)
BACTERIA UA: NONE SEEN
Bilirubin Urine: NEGATIVE
Glucose, UA: NEGATIVE mg/dL
Nitrite: NEGATIVE
Protein, ur: NEGATIVE mg/dL
Specific Gravity, Urine: 1.033 — ABNORMAL HIGH (ref 1.005–1.030)
pH: 5 (ref 5.0–8.0)

## 2016-06-07 LAB — TSH: TSH: 3.735 u[IU]/mL (ref 0.350–4.500)

## 2016-06-07 LAB — CBC
HEMATOCRIT: 24.8 % — AB (ref 40.0–52.0)
HEMOGLOBIN: 8.2 g/dL — AB (ref 13.0–18.0)
MCH: 28.4 pg (ref 26.0–34.0)
MCHC: 33.3 g/dL (ref 32.0–36.0)
MCV: 85.4 fL (ref 80.0–100.0)
Platelets: 47 10*3/uL — ABNORMAL LOW (ref 150–440)
RBC: 2.9 MIL/uL — AB (ref 4.40–5.90)
RDW: 18.1 % — ABNORMAL HIGH (ref 11.5–14.5)
WBC: 3.9 10*3/uL (ref 3.8–10.6)

## 2016-06-07 LAB — BODY FLUID CELL COUNT WITH DIFFERENTIAL
Eos, Fluid: 0 %
Lymphs, Fluid: 81 %
Monocyte-Macrophage-Serous Fluid: 7 %
Neutrophil Count, Fluid: 12 %
Total Nucleated Cell Count, Fluid: 306 cu mm

## 2016-06-07 LAB — LACTATE DEHYDROGENASE, PLEURAL OR PERITONEAL FLUID: LD, Fluid: 462 U/L — ABNORMAL HIGH (ref 3–23)

## 2016-06-07 LAB — AMYLASE, BODY FLUID: AMYLASE FL: 15 U/L

## 2016-06-07 LAB — GRAM STAIN

## 2016-06-07 LAB — CORTISOL: Cortisol, Plasma: 13.9 ug/dL

## 2016-06-07 LAB — GLUCOSE, SEROUS FLUID: GLUCOSE FL: 142 mg/dL

## 2016-06-07 LAB — LACTATE DEHYDROGENASE: LDH: 2263 U/L — AB (ref 98–192)

## 2016-06-07 LAB — PROTEIN, BODY FLUID

## 2016-06-07 LAB — GLUCOSE, CAPILLARY
GLUCOSE-CAPILLARY: 189 mg/dL — AB (ref 65–99)
GLUCOSE-CAPILLARY: 149 mg/dL — AB (ref 65–99)
Glucose-Capillary: 128 mg/dL — ABNORMAL HIGH (ref 65–99)
Glucose-Capillary: 155 mg/dL — ABNORMAL HIGH (ref 65–99)

## 2016-06-07 MED ORDER — TRAMADOL HCL 50 MG PO TABS
50.0000 mg | ORAL_TABLET | Freq: Three times a day (TID) | ORAL | Status: DC | PRN
  Administered 2016-06-07: 19:00:00 50 mg via ORAL
  Filled 2016-06-07: qty 1

## 2016-06-07 NOTE — Consult Note (Signed)
[facility] CONSULT NOTE  Patient Care Team: Tommie Sams, DO as PCP - General (Family Medicine)  CHIEF COMPLAINTS/PURPOSE OF CONSULTATION:  Metastatic melanoma on immunotherapy  HISTORY OF PRESENTING ILLNESS:  [name] [name] 75 y.o.  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 Patient underwent cystoscopy/biopsy melanoma;  Further workup with imaging showed-  Diffuse metastatic disease to the bone and retroperitoneal lymph nodes present metastases also pleural metastasis with malignant pleural effusions.  These were imaging concerning for involvement of the bone marrow;   Osseous metastases also noted.   Patient is currently being treated At [facility] ([place]) with combination immunotherapy with ipi + Nivo; Status post secondary to on June 21st.  Patient also had been  Needing blood transfusion for his fatigue.  Patient was also noted to have mild thrombocytopenia.    patient noted to have progressive shortness of breath  Over the last few days.  Progressive cough; no hemoptysis;  Nonproductive. Poor appetite. Positive for fatigue.   Possible constipation or diarrhea. No skin rash. No headaches. No vision changes. No double vision.   Complains of progressive pain in his lower back-  Has not been tolerant to narcotics.  Currently on toradol.   ROS: A complete 10 point review of system is done which is negative except mentioned above in history of present illness  MEDICAL HISTORY:  Past Medical History  Diagnosis Date  . Insulin dependent diabetes mellitus (HCC)   . Arteriosclerotic coronary artery disease   . Peyronie's disease   . Prostatitis   . Benign essential tremor   . Hyperlipidemia   . Bladder diverticulum   . Melanoma (HCC)     SURGICAL HISTORY: Past Surgical History  Procedure Laterality Date  . Cholecystectomy  1973  . 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 Marital Status: Married    Spouse Name: N/A  . Number of Children: N/A  . Years of Education: 16   Occupational History  . Merchant    Social History Main Topics  . Smoking status: Never Smoker   . Smokeless tobacco: Never Used  . Alcohol Use: Yes  . Drug Use: No  . Sexual Activity: Not on file   Other Topics Concern  . Not on file   Social History Narrative   Regular exercise-yes    FAMILY HISTORY: Family History  Problem Relation Age of Onset  . Arthritis Mother   . Heart disease Mother   . Heart attack Mother   . Diabetes Sister   . Heart disease Brother   . Diabetes Brother     ALLERGIES:  is allergic to ciprofloxacin and codeine.  MEDICATIONS:  Current Facility-Administered Medications  Medication Dose Route Frequency Provider Last Rate Last Dose  . 0.9 %  sodium chloride infusion  250 mL Intravenous PRN Milagros Loll, MD      . acetaminophen (TYLENOL) tablet 650 mg  650 mg Oral Q6H PRN Milagros Loll, MD   650 mg at 06/06/16 2338   Or  . acetaminophen (TYLENOL) suppository 650 mg  650 mg Rectal Q6H PRN Srikar Sudini, MD      . albuterol (PROVENTIL) (2.5 MG/3ML) 0.083% nebulizer solution 2.5 mg  2.5 mg Nebulization Q2H PRN Srikar Sudini, MD      . bisacodyl (DULCOLAX) EC tablet 5 mg  5 mg  Oral Daily PRN Hillary Bow, MD      . docusate sodium (COLACE) capsule 100 mg  100 mg Oral BID Hillary Bow, MD   100 mg at 06/07/16 0914  . insulin aspart (novoLOG) injection 0-9 Units  0-9 Units Subcutaneous TID WC Hillary Bow, MD   2 Units at 06/07/16 1701  . insulin aspart protamine- aspart (NOVOLOG MIX 70/30) injection 14 Units  14 Units Subcutaneous BID WC Hillary Bow, MD   14 Units at 06/07/16 1701  . ketorolac (TORADOL) 30 MG/ML injection 15 mg  15 mg Intravenous Q6H PRN Srikar Sudini, MD   15 mg at 06/07/16 1520  . naproxen (NAPROSYN) tablet 375 mg  375 mg Oral TID PRN Hillary Bow, MD   375 mg at  06/07/16 1124  . ondansetron (ZOFRAN) tablet 4 mg  4 mg Oral Q6H PRN Hillary Bow, MD       Or  . ondansetron (ZOFRAN) injection 4 mg  4 mg Intravenous Q6H PRN Hillary Bow, MD   4 mg at 06/06/16 1823  . polyethylene glycol (MIRALAX / GLYCOLAX) packet 17 g  17 g Oral Daily PRN Srikar Sudini, MD      . primidone (MYSOLINE) tablet 50 mg  50 mg Oral Daily Hillary Bow, MD   50 mg at 06/07/16 0914  . sodium chloride flush (NS) 0.9 % injection 3 mL  3 mL Intravenous Q12H Hillary Bow, MD   3 mL at 06/07/16 0915  . sodium chloride flush (NS) 0.9 % injection 3 mL  3 mL Intravenous PRN Srikar Sudini, MD      . tamsulosin (FLOMAX) capsule 0.4 mg  0.4 mg Oral QPC breakfast Hillary Bow, MD   0.4 mg at 06/07/16 9147      .  PHYSICAL EXAMINATION:  Filed Vitals:   06/07/16 1235 06/07/16 1350  BP: 117/55 116/56  Pulse: 87 87  Temp:  98.1 F (36.7 C)  Resp: 18 20   Filed Weights   06/06/16 1530 06/07/16 0418  Weight: 182 lb (82.555 kg) 186 lb 6.4 oz (84.55 kg)    GENERAL: Well-nourished well-developed; Alert, mild distress sec to pain.  Is alone. EYES: no pallor or icterus OROPHARYNX: no thrush or ulceration. NECK: supple, no masses felt LYMPH:  no palpable lymphadenopathy in the cervical, axillary or inguinal regions LUNGS: decreased breath sounds to auscultation at bases and  No wheeze or crackles HEART/CVS: regular rate & rhythm and no murmurs; No lower extremity edema ABDOMEN: abdomen soft, non-tender and normal bowel sounds Musculoskeletal:no cyanosis of digits and no clubbing  PSYCH: alert & oriented x 3 with fluent speech NEURO: no focal motor/sensory deficits SKIN:  no rashes or significant lesions  LABORATORY DATA:  I have reviewed the data as listed Lab Results  Component Value Date   WBC 3.9 06/07/2016   HGB 8.2* 06/07/2016   HCT 24.8* 06/07/2016   MCV 85.4 06/07/2016   PLT 47* 06/07/2016    Recent Labs  05/07/16 2115  05/09/16 1019 05/10/16 0406  06/06/16 1524 06/07/16 0416  NA 130*  < >  --  132* 131* 133*  K 4.6  < >  --  4.2 3.8 4.0  CL 98*  < >  --  104 95* 99*  CO2 22  < >  --  '23 24 26  '$ GLUCOSE 240*  < >  --  71 156* 125*  BUN 44*  < >  --  32* 29* 30*  CREATININE 1.31*  < >  --  0.96 0.93 0.89  CALCIUM 8.4*  < >  --  8.0* 7.8* 7.6*  GFRNONAA 52*  < >  --  >60 >60 >60  GFRAA 60*  < >  --  >60 >60 >60  PROT 5.7*  --  4.8*  --  4.8*  --   ALBUMIN 2.2*  --  1.8*  --  2.1*  --   AST 75*  --  105*  --  51*  --   ALT 44  --  62  --  15*  --   ALKPHOS 197*  --  191*  --  137*  --   BILITOT 1.0  --  0.5  --  1.1  --   BILIDIR  --   --  0.2  --   --   --   IBILI  --   --  0.3  --   --   --   < > = values in this interval not displayed.  RADIOGRAPHIC STUDIES: I have personally reviewed the radiological images as listed and agreed with the findings in the report. Dg Chest 2 View  06/07/2016  CLINICAL DATA:  Left-sided thoracentesis EXAM: CHEST  2 VIEW COMPARISON:  Yesterday FINDINGS: Diminished left pleural effusion status post thoracentesis. No re-expansion edema or pneumothorax. Mild left basilar atelectasis. Mild residual pleural fluid or thickening. Normal heart size and aortic contours.  Status post CABG. Known sclerotic osseous metastases are not well visualized. IMPRESSION: 1. No acute finding after left thoracentesis. 2. Left basilar atelectasis. Electronically Signed   By: Monte Fantasia M.D.   On: 06/07/2016 12:58   Dg Chest 2 View  06/06/2016  CLINICAL DATA:  Weakness, shortness of Breath, metastatic melanoma EXAM: CHEST  2 VIEW COMPARISON:  05/07/2016 FINDINGS: Cardiomediastinal silhouette is stable. Status post median sternotomy. Linear atelectasis or scarring noted right lower lobe perihilar. There is small left pleural effusion left basilar atelectasis or infiltrate. No pulmonary edema. IMPRESSION: Status post CABG. Small left pleural effusion with left basilar atelectasis or infiltrate. Electronically Signed   By:  Lahoma Crocker M.D.   On: 06/06/2016 16:40   Dg Lumbar Spine Complete  06/07/2016  CLINICAL DATA:  Low back pain EXAM: LUMBAR SPINE - COMPLETE 4+ VIEW COMPARISON:  04/29/2014 FINDINGS: Scattered large and small bowel gas is noted. Fecal material is noted throughout the colon. A 12 mm triangular shaped density is noted in the lower pole of the right kidney consistent with a nonobstructing stone. There is fullness of the collecting system and ureter on the left suspicious for a distal ureteral calculus. Significant trabeculation of the bladder is noted. Multilevel degenerative changes are seen with osteophytic change and disc space narrowing. Facet hypertrophic changes are noted. No compression deformities are seen. Diffuse aortic calcifications are noted IMPRESSION: Increase in size in the right lower pole renal calculus. Changes suggestive of at least partial ureteral obstruction on the left. Further evaluation is recommended. This may contribute to the patient's underlying back pain. Degenerative changes of the lumbar spine without acute abnormality. Increased trabeculation of the bladder which may be related to bladder outlet obstruction. Electronically Signed   By: Inez Catalina M.D.   On: 06/07/2016 09:41   Ct Angio Chest Pe W/cm &/or Wo Cm  06/06/2016  CLINICAL DATA:  Coughing and shortness of breath. Evaluate for pulmonary embolus. EXAM: CT ANGIOGRAPHY CHEST WITH CONTRAST TECHNIQUE: Multidetector CT imaging of the chest was performed using the standard protocol during bolus administration of intravenous contrast. Multiplanar CT  image reconstructions and MIPs were obtained to evaluate the vascular anatomy. CONTRAST:  100 mL of Isovue 370 COMPARISON:  CT scan of the chest January 11, 2016 and chest x-ray from today FINDINGS: The central airways are normal. No pneumothorax. There is a small right pleural effusion and moderate size left effusion, both with associated compressive atelectasis. No pulmonary nodules,  masses, or other focal infiltrates. Evaluation for pulmonary emboli is limited in the lower lobes due to compressive atelectasis and effusions. Within this limitation, no pulmonary emboli are identified. There are coronary artery calcifications. The thoracic aorta is non aneurysmal with no dissection. The heart size is unchanged. No pericardial effusion. There is adenopathy in the chest and abdomen. The adenopathy is diffuse. For example, there is a node just deep to the left pectoralis muscle on series 4, image 14 measuring 18 x 18 mm. Nodes in the left axilla and elsewhere in the left retro pectoral region are prominent but not enlarged by CT criteria. There is also a node at the midline of the base of neck on series 4, image 6 which is larger in the interval. Enlarging prevascular nodes are seen. Numerous newly enlarged nodes are seen in the epicardial fat best seen on series 4, images 112-115. A representative node on image 113 to the left of midline measures 13 by 16 mm. Numerous enlarged nodes are seen in the upper abdomen as well. A representative node in the fat superior to the right kidney on series 4, image 136 measures 23 by 19 mm. Visualized portions of the adrenal glands, abdominal aorta, stomach, and liver are normal. The spleen is enlarged and heterogeneous but only partially visualized. Increased attenuation is seen in the subcutaneous fat diffusely suggesting volume overload/ edema. Bilateral gynecomastia is noted. There is increased patchy attenuation in the bones, best seen in the spine. The ribs and inferior sternum appear to be involved as well. Review of the MIP images confirms the above findings. IMPRESSION: 1. No pulmonary emboli are identified. 2. Diffuse adenopathy in the chest and upper abdomen in addition to scattered sclerosis throughout the visualized bones is most consistent with metastatic disease. The patient has a history of melanoma and prostate cancer. 3. Bilateral pleural  effusions and atelectasis. 4. The spleen remains enlarged and is heterogeneous. The heterogeneity may be at least partially due to see arterial phase imaging. Electronically Signed   By: Dorise Bullion III M.D   On: 06/06/2016 17:22   US Thoracentesis Asp Pleural Space W/img Guide  06/07/2016  INDICATION: Left pleural effusion. EXAM: ULTRASOUND GUIDED left THORACENTESIS MEDICATIONS: None. COMPLICATIONS: None immediate. PROCEDURE: An ultrasound guided thoracentesis was thoroughly discussed with the patient and questions answered. The benefits, risks, alternatives and complications were also discussed. The patient understands and wishes to proceed with the procedure. Written consent was obtained. Ultrasound was performed to localize and mark an adequate pocket of fluid in the left chest. The area was then prepped and draped in the normal sterile fashion. 1% Lidocaine was used for local anesthesia. Under ultrasound guidance a Safe-T-Centesis catheter was introduced. Thoracentesis was performed. The catheter was removed and a dressing applied. FINDINGS: A total of approximately 900 mL of serous fluid was removed. Samples were sent to the laboratory as requested by the clinical team. IMPRESSION: Successful ultrasound guided left thoracentesis yielding 900 mL of pleural fluid. Electronically Signed   By: Marijo Conception, M.D.   On: 06/07/2016 13:10    ASSESSMENT & PLAN:   #  75 year old male  patient with metastatic melanoma to the bladder/ bone/ metastases  Currently on  Combination immunotherapy is currently admitted to the hospital for  Worsening shortness of breath/ pleural effusion.   #  Acute shortness of breath/  Bilateral pleural effusion left more than right status post ultrasound-guided thoracentesis 900 cc. Question malignant;  Cytology pending.   #  Anemia/ needing transfusion as outpatient;  Hemoglobin today 8.2 Also thrombocytopenia -  Currently 40s to 60s-  Question related to  Bone marrow  infiltrative process  By underlying malignancy.  No prior bone marrow biopsy.   #  Back pain-  Secondary to malignancy; poorly controlled.   Poor tolerance to narcotics/ constipation.  Recommend tramadol 50 mg every 8 hours.   #  Positive for fatigue-  Multifactorial;  Rule out endocrine etiologies.  #  I will try to contact patient's primary oncologist at [facility]  In the morning.  The above plan of care was discussed with the patient.  Plan of care was also discussed with Dr. Manuella Ghazi.  I reviewed the images myself.   Thank you Dr.Shah for allowing me to participate in the care of your pleasant patient. Please do not hesitate to contact me with questions or concerns in the interim.     Cammie Sickle, MD 06/07/2016 6:30 PM

## 2016-06-07 NOTE — Care Management Important Message (Signed)
Important Message  Patient Details  Name: ODIE NEHER MRN: [id number] Date of Birth: 03/29/1941   Medicare Important Message Given:  Yes    Shelbie Ammons, RN 06/07/2016, 11:22 AM

## 2016-06-07 NOTE — Progress Notes (Signed)
Patient ID: [name] [name], male   DOB: 1940/12/22, 75 y.o.   MRN: [id number] [place] PROGRESS NOTE  RONDAL GRICE [id number] DOB: 03-07-1941 DOA: 06/06/2016 PCP: Coral Spikes, DO  HPI/Subjective: SOB, hypoxia, having lots of back pain.  Objective: Filed Vitals:   06/07/16 0352 06/07/16 0742  BP: 115/46 116/56  Pulse: 81 79  Temp: 98.7 F (37.1 C) 97.8 F (36.6 C)  Resp:      Filed Weights   06/06/16 1530 06/07/16 0418  Weight: 82.555 kg (182 lb) 84.55 kg (186 lb 6.4 oz)    ROS: Review of Systems  Constitutional: Positive for malaise/fatigue. Negative for chills.  Eyes: Negative for blurred vision.  Respiratory: Positive for shortness of breath. Negative for cough.   Cardiovascular: Negative for chest pain.  Gastrointestinal: Negative for nausea, vomiting, abdominal pain, diarrhea and constipation.  Genitourinary: Negative for dysuria.  Musculoskeletal: Positive for back pain. Negative for joint pain.  Neurological: Positive for weakness. Negative for dizziness and headaches.   Exam: Physical Exam  Constitutional: He is oriented to person, place, and time.  HENT:  Nose: No mucosal edema.  Mouth/Throat: No oropharyngeal exudate or posterior oropharyngeal edema.  Eyes: Conjunctivae, EOM and lids are normal. Pupils are equal, round, and reactive to light.  Neck: No JVD present. Carotid bruit is not present. No edema present. No thyroid mass and no thyromegaly present.  Cardiovascular: S1 normal and S2 normal.  Exam reveals no gallop.   No murmur heard. Pulses:      Dorsalis pedis pulses are 2+ on the right side, and 2+ on the left side.  Respiratory: No respiratory distress. He has decreased breath sounds in the right lower field and the left lower field. He has no wheezes. He has no rhonchi. He has no rales.  GI: Soft. Bowel sounds are normal. There is no tenderness.  Musculoskeletal:       Right ankle: He exhibits no swelling.       Left ankle: He exhibits  no swelling.  Lymphadenopathy:    He has no cervical adenopathy.  Neurological: He is alert and oriented to person, place, and time. No cranial nerve deficit.  Skin: Skin is warm. Nails show no clubbing.  Psychiatric: He has a normal mood and affect.    Data Reviewed: Basic Metabolic Panel:  Recent Labs Lab 06/06/16 1524 06/07/16 0416  NA 131* 133*  K 3.8 4.0  CL 95* 99*  CO2 24 26  GLUCOSE 156* 125*  BUN 29* 30*  CREATININE 0.93 0.89  CALCIUM 7.8* 7.6*   Liver Function Tests:  Recent Labs Lab 06/06/16 1524  AST 51*  ALT 15*  ALKPHOS 137*  BILITOT 1.1  PROT 4.8*  ALBUMIN 2.1*   No results for input(s): LIPASE, AMYLASE in the last 168 hours. No results for input(s): AMMONIA in the last 168 hours. CBC:  Recent Labs Lab 06/06/16 1524 06/07/16 0416  WBC 5.3 3.9  HGB 9.7* 8.2*  HCT 28.7* 24.8*  MCV 84.9 85.4  PLT 64* 47*   Cardiac Enzymes:  Recent Labs Lab 06/06/16 1524  TROPONINI <0.03    ProBNP (last 3 results)  Recent Labs  01/11/16 1217  PROBNP 137.0*    CBG:  Recent Labs Lab 06/06/16 1826 06/06/16 2121 06/07/16 0729  GLUCAP 124* 145* 128*    Recent Results (from the past 240 hour(s))  Culture, blood (routine x 2)     Status: None (Preliminary result)   Collection Time: 06/06/16  4:00 PM  Result Value Ref Range Status   Specimen Description BLOOD LEFT ARM  Final   Special Requests   Final    BOTTLES DRAWN AEROBIC AND ANAEROBIC AERO 20CC ANA 13CC   Culture NO GROWTH < 12 HOURS  Final   Report Status PENDING  Incomplete  Culture, blood (routine x 2)     Status: None (Preliminary result)   Collection Time: 06/06/16  4:16 PM  Result Value Ref Range Status   Specimen Description BLOOD RIGHT ASSIST CONTROL  Final   Special Requests BOTTLES DRAWN AEROBIC AND ANAEROBIC  [place]  Final   Culture NO GROWTH < 12 HOURS  Final   Report Status PENDING  Incomplete     Studies: Dg Chest 2 View  06/06/2016  CLINICAL DATA:  Weakness, shortness  of Breath, metastatic melanoma EXAM: CHEST  2 VIEW COMPARISON:  05/07/2016 FINDINGS: Cardiomediastinal silhouette is stable. Status post median sternotomy. Linear atelectasis or scarring noted right lower lobe perihilar. There is small left pleural effusion left basilar atelectasis or infiltrate. No pulmonary edema. IMPRESSION: Status post CABG. Small left pleural effusion with left basilar atelectasis or infiltrate. Electronically Signed   By: Lahoma Crocker M.D.   On: 06/06/2016 16:40   Ct Angio Chest Pe W/cm &/or Wo Cm  06/06/2016  CLINICAL DATA:  Coughing and shortness of breath. Evaluate for pulmonary embolus. EXAM: CT ANGIOGRAPHY CHEST WITH CONTRAST TECHNIQUE: Multidetector CT imaging of the chest was performed using the standard protocol during bolus administration of intravenous contrast. Multiplanar CT image reconstructions and MIPs were obtained to evaluate the vascular anatomy. CONTRAST:  100 mL of Isovue 370 COMPARISON:  CT scan of the chest January 11, 2016 and chest x-ray from today FINDINGS: The central airways are normal. No pneumothorax. There is a small right pleural effusion and moderate size left effusion, both with associated compressive atelectasis. No pulmonary nodules, masses, or other focal infiltrates. Evaluation for pulmonary emboli is limited in the lower lobes due to compressive atelectasis and effusions. Within this limitation, no pulmonary emboli are identified. There are coronary artery calcifications. The thoracic aorta is non aneurysmal with no dissection. The heart size is unchanged. No pericardial effusion. There is adenopathy in the chest and abdomen. The adenopathy is diffuse. For example, there is a node just deep to the left pectoralis muscle on series 4, image 14 measuring 18 x 18 mm. Nodes in the left axilla and elsewhere in the left retro pectoral region are prominent but not enlarged by CT criteria. There is also a node at the midline of the base of neck on series 4, image  6 which is larger in the interval. Enlarging prevascular nodes are seen. Numerous newly enlarged nodes are seen in the epicardial fat best seen on series 4, images 112-115. A representative node on image 113 to the left of midline measures 13 by 16 mm. Numerous enlarged nodes are seen in the upper abdomen as well. A representative node in the fat superior to the right kidney on series 4, image 136 measures 23 by 19 mm. Visualized portions of the adrenal glands, abdominal aorta, stomach, and liver are normal. The spleen is enlarged and heterogeneous but only partially visualized. Increased attenuation is seen in the subcutaneous fat diffusely suggesting volume overload/ edema. Bilateral gynecomastia is noted. There is increased patchy attenuation in the bones, best seen in the spine. The ribs and inferior sternum appear to be involved as well. Review of the MIP images confirms the above findings. IMPRESSION: 1. No pulmonary  emboli are identified. 2. Diffuse adenopathy in the chest and upper abdomen in addition to scattered sclerosis throughout the visualized bones is most consistent with metastatic disease. The patient has a history of melanoma and prostate cancer. 3. Bilateral pleural effusions and atelectasis. 4. The spleen remains enlarged and is heterogeneous. The heterogeneity may be at least partially due to see arterial phase imaging. Electronically Signed   By: Dorise Bullion III M.D   On: 06/06/2016 17:22    Scheduled Meds: . atorvastatin  10 mg Oral Daily  . docusate sodium  100 mg Oral BID  . enoxaparin (LOVENOX) injection  40 mg Subcutaneous Q24H  . insulin aspart  0-9 Units Subcutaneous TID WC  . insulin aspart protamine- aspart  14 Units Subcutaneous BID WC  . primidone  50 mg Oral Daily  . sodium chloride flush  3 mL Intravenous Q12H  . tamsulosin  0.4 mg Oral QPC breakfast   Continuous Infusions:    Assessment/Plan:  * Acute hypoxic respiratory failure due to bilateral pleural  effusions - Continue oxygen and wean as tolerated. Nebulizers when necessary. No signs of infection. 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   * Metastatic Melanoma - getting Immunotherapy at [facility] - will c/s Onco (d/w onco here)  * Weakness: - Likely multifactorial - due to metastatic melanoma  * Hyponatremia - likely due to mild dehydration - monitor with IVF  * Dehydration - monitor while on IV fluids.  * Insulin-dependent diabetes mellitus - Diabetic diet - Novolin 70/30 at 14 units twice a day. This is half the dose that he uses at home. - Sliding scale insulin.  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 Family Communication: Family to arrive later today, will try to talk with Wife  Disposition Plan: possible D/C in 2-3 days depending on Pulmo, onco eval and clinical condition   Time spent: 35 minutes  [facility], [place]

## 2016-06-07 NOTE — Consult Note (Signed)
Name: [name] [name] MRN: [id number] DOB: 1940/12/29    ADMISSION DATE:  06/06/2016 CONSULTATION DATE:  06/07/16  REFERRING MD :  Darvin Neighbours  CHIEF COMPLAINT: left sided pleural effusion  BRIEF PATIENT DESCRIPTION:  [name] [name] is a75 year old male with known history of Diabetes mellitus,CAD, Peyronie's disease, Hyperlipidemia, bladder diverticulum, metastatic melanoma, getting immunotherapy at [facility].  patient is presenting with shortness of breath due to pleural effusion on left >right  SIGNIFICANT EVENTS  6/27 patient admitted to [facility] with left-sided pleural effusion   STUDIES:   6/27 CT chest >>No pulmonary emboli are identified.. Diffuse adenopathy in the chest and upper abdomen in addition to scattered sclerosis throughout the visualized bones is mostconsistent with metastatic disease. The patient has a history ofmelanoma and prostate cancer.. Bilateral pleural effusions and atelectasis.. The spleen remains enlarged and is heterogeneous. Theheterogeneity may be at least partially due to see arterial phaseimaging.   HISTORY OF PRESENT ILLNESS:  [name] [name] is a75 year old male with known history of Diabetes mellitus,CAD, Peyronie's disease, Hyperlipidemia, bladder diverticulum, metastatic melanoma, getting immunotherapy at [facility]  Patient presented  To ED with complaints of shortness of breath and worsening back pain.Patient states that his back pain was ongoing for more than 2 weeks but his worsening  shortness of breath is new and progressively got worse in 2 days. Patient does not use any oxygen at home. When he was brought to the ED his sats where in 84%. His CT scan was concerning for bilateral pleural effusion with left greater than right. Upon review of records from [place] patient does have metastatic melanoma, pleural metastases including bone marrow involvement. Patient is getting immunotherapy at [facility] and has undergone 2 doses. Patient states that his last  immunotherapy was on 6/21 . [facility] M team was consulted for worsening shortness of breath due to pleural effusion left > right.   PAST MEDICAL HISTORY :   has a past medical history of Insulin dependent diabetes mellitus ([place]); Arteriosclerotic coronary artery disease; Peyronie's disease; Prostatitis; Benign essential tremor; Hyperlipidemia; Bladder diverticulum; and Melanoma ([place]).  has past surgical history that includes Cholecystectomy (1973) and Coronary artery bypass graft (04/2010). Prior to Admission medications   Medication Sig Start Date End Date Taking? Authorizing Provider  atorvastatin (LIPITOR) 10 MG tablet Take 10 mg by mouth daily.   Yes Historical Provider, MD  cholecalciferol (VITAMIN D) 1000 UNITS tablet Take 1,000 Units by mouth daily.     Yes Historical Provider, MD  docusate sodium (COLACE) 100 MG capsule Take 100 mg by mouth daily.   Yes Historical Provider, MD  insulin lispro protamine-lispro (HUMALOG MIX 75/25) (75-25) 100 UNIT/ML SUSP injection Inject 20-30 Units into the skin 2 (two) times daily with a meal. Pt uses per sliding scale.   Yes Historical Provider, MD  nitrofurantoin, macrocrystal-monohydrate, (MACROBID) 100 MG capsule Take 100 mg by mouth daily.   Yes Historical Provider, MD  primidone (MYSOLINE) 50 MG tablet Take 50 mg by mouth daily.    Yes Historical Provider, MD  tamsulosin (FLOMAX) 0.4 MG CAPS capsule Take 0.4 mg by mouth daily after breakfast.    Yes Historical Provider, MD  Turmeric 500 MG CAPS Take 500 mg by mouth daily.   Yes Historical Provider, MD  valsartan-hydrochlorothiazide (DIOVAN-HCT) 80-12.5 MG tablet Take 1 tablet by mouth daily.   Yes Historical Provider, MD  vitamin B-12 (CYANOCOBALAMIN) 1000 MCG tablet Take 1,000 mcg by mouth daily.   Yes Historical Provider, MD   Allergies  Allergen Reactions  .  Ciprofloxacin Nausea And Vomiting and Other (See Comments)    Reaction:  Shaking   . Codeine Other (See Comments)    Reaction:  Cramping      FAMILY HISTORY:  family history includes Arthritis in his mother; Diabetes in his brother and sister; Heart attack in his mother; Heart disease in his brother and mother. SOCIAL HISTORY:  reports that he has never smoked. He has never used smokeless tobacco. He reports that he drinks alcohol. He reports that he does not use illicit drugs.  REVIEW OF SYSTEMS:   Constitutional: Negative for fever, chills, weight loss, malaise/fatigue and diaphoresis.  HENT: Negative for hearing loss, ear pain, nosebleeds, congestion, sore throat, neck pain, tinnitus and ear discharge.   Eyes: Negative for blurred vision, double vision, photophobia, pain, discharge and redness.  Respiratory: Negative for cough, hemoptysis, sputum production, shortness of breath, wheezing and stridor.   Cardiovascular: Negative for chest pain, palpitations, orthopnea, claudication, leg swelling and PND.  Gastrointestinal: Negative for heartburn, nausea, vomiting, abdominal pain, diarrhea, constipation, blood in stool and melena.  Genitourinary: Negative for dysuria, urgency, frequency, hematuria and flank pain.  Musculoskeletal: Negative for myalgias, back pain, joint pain and falls.  Skin: Negative for itching and rash.  Neurological: Negative for dizziness, tingling, tremors, sensory change, speech change, focal weakness, seizures, loss of consciousness, weakness and headaches.  Endo/Heme/Allergies: Negative for environmental allergies and polydipsia. Does not bruise/bleed easily.  SUBJECTIVE:   "Patient states that he was short of breath and now feels better" VITAL SIGNS: Temp:  [97.4 F (36.3 C)-98.7 F (37.1 C)] 98.1 F (36.7 C) (06/28 1350) Pulse Rate:  [79-93] 87 (06/28 1350) Resp:  [18-34] 20 (06/28 1350) BP: (100-131)/(46-69) 116/56 mmHg (06/28 1350) SpO2:  [80 %-100 %] 100 % (06/28 1350) Weight:  [182 lb (82.555 kg)-186 lb 6.4 oz (84.55 kg)] 186 lb 6.4 oz (84.55 kg) (06/28 0418)  PHYSICAL  EXAMINATION: General:  Elderly white male, on 3 L, no respiratory distress  Neuro:  Awake, alert, oriented follows commands, no focal deficits  HEENT:Atraumatic, normocephalic, PERRLA, no discharge no JVD appreciated  Cardiovascular:  S1 and S2, regular, no MRG noted  Lungs:  Diminished towards left lower, symmetric chest expansion, no wheezes, crackles, rhonchi noted  Abdomen:  Soft, nontender, active bowel sounds Musculoskeletal:  trace edema bilateral lower extremities, no inflammation / deformity noted  Skin:  Wounds on left lower extremity,   Recent Labs Lab 06/06/16 1524 06/07/16 0416  NA 131* 133*  K 3.8 4.0  CL 95* 99*  CO2 24 26  BUN 29* 30*  CREATININE 0.93 0.89  GLUCOSE 156* 125*    Recent Labs Lab 06/06/16 1524 06/07/16 0416  HGB 9.7* 8.2*  HCT 28.7* 24.8*  WBC 5.3 3.9  PLT 64* 47*   Dg Chest 2 View  06/07/2016  CLINICAL DATA:  Left-sided thoracentesis EXAM: CHEST  2 VIEW COMPARISON:  Yesterday FINDINGS: Diminished left pleural effusion status post thoracentesis. No re-expansion edema or pneumothorax. Mild left basilar atelectasis. Mild residual pleural fluid or thickening. Normal heart size and aortic contours.  Status post CABG. Known sclerotic osseous metastases are not well visualized. IMPRESSION: 1. No acute finding after left thoracentesis. 2. Left basilar atelectasis. Electronically Signed   By: Monte Fantasia M.D.   On: 06/07/2016 12:58   Dg Chest 2 View  06/06/2016  CLINICAL DATA:  Weakness, shortness of Breath, metastatic melanoma EXAM: CHEST  2 VIEW COMPARISON:  05/07/2016 FINDINGS: Cardiomediastinal silhouette is stable. Status post median sternotomy. Linear atelectasis  or scarring noted right lower lobe perihilar. There is small left pleural effusion left basilar atelectasis or infiltrate. No pulmonary edema. IMPRESSION: Status post CABG. Small left pleural effusion with left basilar atelectasis or infiltrate. Electronically Signed   By: Lahoma Crocker M.D.    On: 06/06/2016 16:40   Dg Lumbar Spine Complete  06/07/2016  CLINICAL DATA:  Low back pain EXAM: LUMBAR SPINE - COMPLETE 4+ VIEW COMPARISON:  04/29/2014 FINDINGS: Scattered large and small bowel gas is noted. Fecal material is noted throughout the colon. A 12 mm triangular shaped density is noted in the lower pole of the right kidney consistent with a nonobstructing stone. There is fullness of the collecting system and ureter on the left suspicious for a distal ureteral calculus. Significant trabeculation of the bladder is noted. Multilevel degenerative changes are seen with osteophytic change and disc space narrowing. Facet hypertrophic changes are noted. No compression deformities are seen. Diffuse aortic calcifications are noted IMPRESSION: Increase in size in the right lower pole renal calculus. Changes suggestive of at least partial ureteral obstruction on the left. Further evaluation is recommended. This may contribute to the patient's underlying back pain. Degenerative changes of the lumbar spine without acute abnormality. Increased trabeculation of the bladder which may be related to bladder outlet obstruction. Electronically Signed   By: Inez Catalina M.D.   On: 06/07/2016 09:41   Ct Angio Chest Pe W/cm &/or Wo Cm  06/06/2016  CLINICAL DATA:  Coughing and shortness of breath. Evaluate for pulmonary embolus. EXAM: CT ANGIOGRAPHY CHEST WITH CONTRAST TECHNIQUE: Multidetector CT imaging of the chest was performed using the standard protocol during bolus administration of intravenous contrast. Multiplanar CT image reconstructions and MIPs were obtained to evaluate the vascular anatomy. CONTRAST:  100 mL of Isovue 370 COMPARISON:  CT scan of the chest January 11, 2016 and chest x-ray from today FINDINGS: The central airways are normal. No pneumothorax. There is a small right pleural effusion and moderate size left effusion, both with associated compressive atelectasis. No pulmonary nodules, masses, or other  focal infiltrates. Evaluation for pulmonary emboli is limited in the lower lobes due to compressive atelectasis and effusions. Within this limitation, no pulmonary emboli are identified. There are coronary artery calcifications. The thoracic aorta is non aneurysmal with no dissection. The heart size is unchanged. No pericardial effusion. There is adenopathy in the chest and abdomen. The adenopathy is diffuse. For example, there is a node just deep to the left pectoralis muscle on series 4, image 14 measuring 18 x 18 mm. Nodes in the left axilla and elsewhere in the left retro pectoral region are prominent but not enlarged by CT criteria. There is also a node at the midline of the base of neck on series 4, image 6 which is larger in the interval. Enlarging prevascular nodes are seen. Numerous newly enlarged nodes are seen in the epicardial fat best seen on series 4, images 112-115. A representative node on image 113 to the left of midline measures 13 by 16 mm. Numerous enlarged nodes are seen in the upper abdomen as well. A representative node in the fat superior to the right kidney on series 4, image 136 measures 23 by 19 mm. Visualized portions of the adrenal glands, abdominal aorta, stomach, and liver are normal. The spleen is enlarged and heterogeneous but only partially visualized. Increased attenuation is seen in the subcutaneous fat diffusely suggesting volume overload/ edema. Bilateral gynecomastia is noted. There is increased patchy attenuation in the bones, best seen  in the spine. The ribs and inferior sternum appear to be involved as well. Review of the MIP images confirms the above findings. IMPRESSION: 1. No pulmonary emboli are identified. 2. Diffuse adenopathy in the chest and upper abdomen in addition to scattered sclerosis throughout the visualized bones is most consistent with metastatic disease. The patient has a history of melanoma and prostate cancer. 3. Bilateral pleural effusions and  atelectasis. 4. The spleen remains enlarged and is heterogeneous. The heterogeneity may be at least partially due to see arterial phase imaging. Electronically Signed   By: Dorise Bullion III M.D   On: 06/06/2016 17:22   US Thoracentesis Asp Pleural Space W/img Guide  06/07/2016  INDICATION: Left pleural effusion. EXAM: ULTRASOUND GUIDED left THORACENTESIS MEDICATIONS: None. COMPLICATIONS: None immediate. PROCEDURE: An ultrasound guided thoracentesis was thoroughly discussed with the patient and questions answered. The benefits, risks, alternatives and complications were also discussed. The patient understands and wishes to proceed with the procedure. Written consent was obtained. Ultrasound was performed to localize and mark an adequate pocket of fluid in the left chest. The area was then prepped and draped in the normal sterile fashion. 1% Lidocaine was used for local anesthesia. Under ultrasound guidance a Safe-T-Centesis catheter was introduced. Thoracentesis was performed. The catheter was removed and a dressing applied. FINDINGS: A total of approximately 900 mL of serous fluid was removed. Samples were sent to the laboratory as requested by the clinical team. IMPRESSION: Successful ultrasound guided left thoracentesis yielding 900 mL of pleural fluid. Electronically Signed   By: Marijo Conception, M.D.   On: 06/07/2016 13:10    ASSESSMENT / PLAN:  Left sided pleural effusion possibly related to metastatic melanoma Back pain   Support with oxygen US Guided thoracentesis ( removed 900cc of fluid), patient feels much better not in any distress Followed at [facility] , with immunotherapy (nivolumab and ipilimumab) continue bronchodilators as needed Toradol prn for severe pain Rest per primary  Bincy Varughese,AG-ACNP Pulmonary and [place]   06/07/2016, 1:55 PM  STAFF NOTE: I, Dr. Vilinda Boehringer have personally reviewed patient's available data, including medical  history, events of note, physical examination and test results as part of my evaluation. I have discussed with NP Ethlyn Gallery  and other care providers such as pharmacist, RN and RRT.  In addition,  I personally evaluated patient and elicited key findings of   HPI:  75 year old male past medical history of metastatic melanoma, hyperlipidemia, diabetes, seen in consultation for left sided pleural effusion. Patient with known history of bladder cancer, ironically found to be melanoma, was started on immunotherapy about one month ago, since then has been experiencing worsening shortness of breath. Today he had a thoracentesis done with 900 mL removed. Had episodes of malaise, fatigue, and chronic dyspnea, which became worse over the last 2 days. Subsequently followed up in the ER that showed a chest x-ray consistent with moderate size left pleural effusion. Review of his imaging study showed he had a chest x-ray done about one month ago with small to minimal amount of pleural fluid on the left. He was also started on immunotherapy about one month ago, ipilimumab and nivolumab  Oncologic History 1. Metastatic Melanoma a. 03/31/16 CT urogram shows Diffuse nodular thickening of the urinary bladder with extensive, bulky lymphadenopathy involving the retroperitoneum and pelvis along with numerous soft tissue nodules scattered throughout the abdomen and pelvis. These findings are concerning for malignancy. 2 sclerotic foci in the T12 and L1 vertebral  bodies are indeterminate (degenerative favored over metastases) b. 04/10/16 resection of bladder tumor consistent with melanoma (+S100) c 05/01/16 CT c/a/p shows There is retroperitoneal and inguinal lymphadenopathy as well as diffuse retroperitoneal and mesenteric metastases. Bladder wall is also involved. Pleural metastases with associated malignant effusions. Bone marrow is diffusely heterogeneous, stress within the spine, which could reflect metastases or a separate  infiltrative process. No pathologic fracture; MRI brain shows Abnormal enhancement right clivus compatible with osseous metastatic disease d. 05/10/16 Cycle 1 ipilimumab '3mg'$ /kg IV and nivolumab '1mg'$ /kg IV e. 05/31/16 Cycle 2 ipilimumab '3mg'$ /kg IV and nivolumab '1mg'$ /kg IV 2. Genetic testing a. BRAF mutation not detected   A: 75 year old male with pleural effusion  P:   -Possible malignant pleural effusion given the level of his metastatic disease of stage IV melanoma. -We will follow up on pleural fluid studies -Immunotherapy can cause a pneumonitis, however isolated left-sided pleural effusion is uncommon.  -patient with significant improvement since having thoracentesis - monitor for pneumothorax, reexpansion pulmonary edema.   .  Rest per NP/medical resident whose note is outlined above and that I agree with  Pulmonary Care Time devoted to patient care services described in this note is  40 Minutes.   This time reflects time of care of this signee Dr Vilinda Boehringer.  This critical care time does not reflect procedure time, or teaching time or supervisory time of PA/NP/Med-student/Med Resident etc but could involve care discussion time.  Vilinda Boehringer, MD [facility] Pager [phone] (please enter 7-digits) On Call Pager [phone] (please enter 7-digits)  Note: This note was prepared with Dragon dictation along with smaller phrase technology. Any transcriptional errors that result from this process are unintentional.

## 2016-06-07 NOTE — Plan of Care (Signed)
Problem: Education: Goal: Knowledge of [facility] Education information/materials will improve Outcome: Progressing Pt likes to be called [name] [name]    Past Medical History   Diagnosis  Date   .  Insulin dependent diabetes mellitus ([place])     .  Arteriosclerotic coronary artery disease     .  Peyronie's disease     .  Prostatitis     .  Benign essential tremor     .  Hyperlipidemia     .  Bladder diverticulum     .  Melanoma ([place])            Pt is well controlled with home medications

## 2016-06-07 NOTE — Procedures (Signed)
Under US guidance, right thoracentesis was performed. CXR to follow. 

## 2016-06-08 ENCOUNTER — Telehealth: Payer: Self-pay | Admitting: Internal Medicine

## 2016-06-08 LAB — BASIC METABOLIC PANEL
Anion gap: 7 (ref 5–15)
BUN: 30 mg/dL — AB (ref 6–20)
CHLORIDE: 100 mmol/L — AB (ref 101–111)
CO2: 26 mmol/L (ref 22–32)
Calcium: 7.8 mg/dL — ABNORMAL LOW (ref 8.9–10.3)
Creatinine, Ser: 0.93 mg/dL (ref 0.61–1.24)
GFR calc Af Amer: >60 mL/min (ref >60)
GFR calc non Af Amer: >60 mL/min (ref >60)
Glucose, Bld: 122 mg/dL — ABNORMAL HIGH (ref 65–99)
POTASSIUM: 4 mmol/L (ref 3.5–5.1)
SODIUM: 133 mmol/L — AB (ref 135–145)

## 2016-06-08 LAB — GLUCOSE, CAPILLARY
Glucose-Capillary: 120 mg/dL — ABNORMAL HIGH (ref 65–99)
Glucose-Capillary: 151 mg/dL — ABNORMAL HIGH (ref 65–99)

## 2016-06-08 LAB — PH, BODY FLUID: PH, BODY FLUID: 7.7

## 2016-06-08 LAB — URINE CULTURE

## 2016-06-08 LAB — CBC
HEMATOCRIT: 26.2 % — AB (ref 40.0–52.0)
HEMOGLOBIN: 8.6 g/dL — AB (ref 13.0–18.0)
MCH: 27.7 pg (ref 26.0–34.0)
MCHC: 32.8 g/dL (ref 32.0–36.0)
MCV: 84.3 fL (ref 80.0–100.0)
Platelets: 48 10*3/uL — ABNORMAL LOW (ref 150–440)
RBC: 3.1 MIL/uL — AB (ref 4.40–5.90)
RDW: 17.6 % — ABNORMAL HIGH (ref 11.5–14.5)
WBC: 4.1 10*3/uL (ref 3.8–10.6)

## 2016-06-08 MED ORDER — TRAMADOL HCL 50 MG PO TABS: 50.0000 mg | ORAL_TABLET | Freq: Three times a day (TID) | ORAL | Status: DC | PRN

## 2016-06-08 MED ORDER — FUROSEMIDE 20 MG PO TABS: ORAL_TABLET | ORAL | Status: DC

## 2016-06-08 MED ORDER — POLYETHYLENE GLYCOL 3350 17 G PO PACK: 17.0000 g | PACK | Freq: Every day | ORAL | Status: DC | PRN

## 2016-06-08 NOTE — Telephone Encounter (Signed)
Pt is discharged. Spoke to patient re: my conversation with Dr.Salama. Wife not around to speak to me over the phone.  I recommend he call tomorrow and make appt with Dr.Salama next week. Dr.B

## 2016-06-08 NOTE — Discharge Summary (Signed)
[name] [facility] [place] NAME: [name] [name]    MR#:  [id number]  DATE OF BIRTH:  1941-08-22  DATE OF ADMISSION:  06/06/2016 ADMITTING PHYSICIAN: Hillary Bow, MD  DATE OF DISCHARGE: 06/08/2016  2:51 PM  PRIMARY CARE PHYSICIAN: Coral Spikes, DO    ADMISSION DIAGNOSIS:  Dyspnea [R06.00]  DISCHARGE DIAGNOSIS:  Active Problems:   Hypoxia   SECONDARY DIAGNOSIS:   Past Medical History  Diagnosis Date  . Insulin dependent diabetes mellitus ([place])   . Arteriosclerotic coronary artery disease   . Peyronie's disease   . Prostatitis   . Benign essential tremor   . Hyperlipidemia   . Bladder diverticulum   . Melanoma ([place])     HOSPITAL COURSE:   1. Acute hypoxic respiratory failure due to pleural effusion. Patient had a thoracentesis and felt much better and he was weaned off his oxygen. Patient was feeling so much better that he wanted to go home. This could be a malignant pleural effusion which could reaccumulate. Start Lasix 20 mg daily as needed for shortness of breath or swelling. 2. Metastatic melanoma. Patient receiving immunotherapy at [facility]. He received 2 doses of immunotherapy and neck stenosis is due on the 12th. 3. Weakness. Restart home health. 4. Essential hypertension. Get rid of Diovan HCT. 5. Hyponatremia secondary to mild dehydration 6. Insulin-dependent diabetes mellitus. Continue his usual regimen 7. BPH on Flomax 8. Anemia of chronic disease 9. Thrombocytopenia  With patient's medical issues he is high risk for readmission. Needs to be outpatient in order to receive immunotherapy.   DISCHARGE CONDITIONS:   Satisfactory  CONSULTS OBTAINED:  Treatment Team:  Laverle Hobby, MD Cammie Sickle, MD  DRUG ALLERGIES:   Allergies  Allergen Reactions  . Ciprofloxacin Nausea And Vomiting and Other (See Comments)    Reaction:  Shaking   . Codeine Other (See Comments)    Reaction:  Cramping     DISCHARGE  MEDICATIONS:   Discharge Medication List as of 06/08/2016  1:31 PM    START taking these medications   Details  furosemide (LASIX) 20 MG tablet Take one tab po daily as needed for swelling or shortness of breath, Print    polyethylene glycol (MIRALAX / GLYCOLAX) packet Take 17 g by mouth daily as needed for mild constipation., Starting 06/08/2016, Until Discontinued, Print    traMADol (ULTRAM) 50 MG tablet Take 1 tablet (50 mg total) by mouth every 8 (eight) hours as needed for moderate pain., Starting 06/08/2016, Until Discontinued, Print      CONTINUE these medications which have NOT CHANGED   Details  atorvastatin (LIPITOR) 10 MG tablet Take 10 mg by mouth daily., Until Discontinued, Historical Med    cholecalciferol (VITAMIN D) 1000 UNITS tablet Take 1,000 Units by mouth daily.  , Until Discontinued, Historical Med    docusate sodium (COLACE) 100 MG capsule Take 100 mg by mouth daily., Until Discontinued, Historical Med    insulin lispro protamine-lispro (HUMALOG MIX 75/25) (75-25) 100 UNIT/ML SUSP injection Inject 20-30 Units into the skin 2 (two) times daily with a meal. Pt uses per sliding scale., Until Discontinued, Historical Med    nitrofurantoin, macrocrystal-monohydrate, (MACROBID) 100 MG capsule Take 100 mg by mouth daily., Until Discontinued, Historical Med    primidone (MYSOLINE) 50 MG tablet Take 50 mg by mouth daily. , Until Discontinued, Historical Med    tamsulosin (FLOMAX) 0.4 MG CAPS capsule Take 0.4 mg by mouth daily after breakfast. , Until Discontinued, Historical  Med    Turmeric 500 MG CAPS Take 500 mg by mouth daily., Until Discontinued, Historical Med    vitamin B-12 (CYANOCOBALAMIN) 1000 MCG tablet Take 1,000 mcg by mouth daily., Until Discontinued, Historical Med      STOP taking these medications     valsartan-hydrochlorothiazide (DIOVAN-HCT) 80-12.5 MG tablet          DISCHARGE INSTRUCTIONS:   Follow-up PMD one week Follow up at [facility] for  immunotherapy  If you experience worsening of your admission symptoms, develop shortness of breath, life threatening emergency, suicidal or homicidal thoughts you must seek medical attention immediately by calling 911 or calling your MD immediately  if symptoms less severe.  You Must read complete instructions/literature along with all the possible adverse reactions/side effects for all the Medicines you take and that have been prescribed to you. Take any new Medicines after you have completely understood and accept all the possible adverse reactions/side effects.   Please note  You were cared for by a hospitalist during your hospital stay. If you have any questions about your discharge medications or the care you received while you were in the hospital after you are discharged, you can call the unit and asked to speak with the hospitalist on call if the hospitalist that took care of you is not available. Once you are discharged, your primary care physician will handle any further medical issues. Please note that NO REFILLS for any discharge medications will be authorized once you are discharged, as it is imperative that you return to your primary care physician (or establish a relationship with a primary care physician if you do not have one) for your aftercare needs so that they can reassess your need for medications and monitor your lab values.    Today   CHIEF COMPLAINT:   Chief Complaint  Patient presents with  . Shortness of Breath    HISTORY OF PRESENT ILLNESS:  [name] [name]  is a 75 y.o. male with a known history of Metastatic melanoma presented with shortness of breath.   VITAL SIGNS:  Blood pressure 112/54, pulse 101, temperature 98.4 F (36.9 C), temperature source Oral, resp. rate 18, height 5' 10.5" (1.791 m), weight 85.503 kg (188 lb 8 oz), SpO2 91 %.    PHYSICAL EXAMINATION:  GENERAL:  75 y.o.-year-old patient lying in the bed with no acute distress.  EYES: Pupils  equal, round, reactive to light and accommodation. No scleral icterus. Extraocular muscles intact.  HEENT: Head atraumatic, normocephalic. Oropharynx and nasopharynx clear.  NECK:  Supple, no jugular venous distention. No thyroid enlargement, no tenderness.  LUNGS: Normal breath sounds bilaterally, no wheezing, rales,rhonchi or crepitation. No use of accessory muscles of respiration.  CARDIOVASCULAR: S1, S2 normal. No murmurs, rubs, or gallops.  ABDOMEN: Soft, non-tender, non-distended. Bowel sounds present. No organomegaly or mass.  EXTREMITIES: Trace edema, no cyanosis, or clubbing.  NEUROLOGIC: Cranial nerves II through XII are intact. Muscle strength 5/5 in all extremities. Sensation intact. Gait not checked.  PSYCHIATRIC: The patient is alert and oriented x 3.  SKIN: No obvious rash, lesion, or ulcer.   DATA REVIEW:   CBC  Recent Labs Lab 06/08/16 0433  WBC 4.1  HGB 8.6*  HCT 26.2*  PLT 48*    Chemistries   Recent Labs Lab 06/06/16 1524  06/08/16 0433  NA 131*  < > 133*  K 3.8  < > 4.0  CL 95*  < > 100*  CO2 24  < > 26  GLUCOSE  156*  < > 122*  BUN 29*  < > 30*  CREATININE 0.93  < > 0.93  CALCIUM 7.8*  < > 7.8*  AST 51*  --   --   ALT 15*  --   --   ALKPHOS 137*  --   --   BILITOT 1.1  --   --   < > = values in this interval not displayed.  Cardiac Enzymes  Recent Labs Lab 06/06/16 1524  TROPONINI <0.03    Microbiology Results  Results for orders placed or performed during the hospital encounter of 06/06/16  Culture, blood (routine x 2)     Status: None (Preliminary result)   Collection Time: 06/06/16  4:00 PM  Result Value Ref Range Status   Specimen Description BLOOD LEFT ARM  Final   Special Requests   Final    BOTTLES DRAWN AEROBIC AND ANAEROBIC AERO 20CC ANA [place]   Culture NO GROWTH 2 DAYS  Final   Report Status PENDING  Incomplete  Culture, blood (routine x 2)     Status: None (Preliminary result)   Collection Time: 06/06/16  4:16 PM  Result  Value Ref Range Status   Specimen Description BLOOD RIGHT ASSIST CONTROL  Final   Special Requests BOTTLES DRAWN AEROBIC AND ANAEROBIC  [place]  Final   Culture NO GROWTH 2 DAYS  Final   Report Status PENDING  Incomplete  Urine culture     Status: Abnormal   Collection Time: 06/07/16  6:49 AM  Result Value Ref Range Status   Specimen Description URINE, RANDOM  Final   Special Requests NONE  Final   Culture MULTIPLE SPECIES PRESENT, SUGGEST RECOLLECTION (A)  Final   Report Status 06/08/2016 FINAL  Final  Culture, body fluid-bottle     Status: None (Preliminary result)   Collection Time: 06/07/16 12:30 PM  Result Value Ref Range Status   Specimen Description FLUID PERITONEAL  Final   Special Requests NONE  Final   Culture   Final    NO GROWTH < 24 HOURS Performed at [facility]    Report Status PENDING  Incomplete  Gram stain     Status: None   Collection Time: 06/07/16 12:30 PM  Result Value Ref Range Status   Specimen Description FLUID PERITONEAL  Final   Special Requests NONE  Final   Gram Stain   Final    FEW WBC PRESENT, PREDOMINANTLY MONONUCLEAR NO ORGANISMS SEEN Performed at [facility]    Report Status 06/07/2016 FINAL  Final    RADIOLOGY:  Dg Chest 2 View  06/07/2016  CLINICAL DATA:  Left-sided thoracentesis EXAM: CHEST  2 VIEW COMPARISON:  Yesterday FINDINGS: Diminished left pleural effusion status post thoracentesis. No re-expansion edema or pneumothorax. Mild left basilar atelectasis. Mild residual pleural fluid or thickening. Normal heart size and aortic contours.  Status post CABG. Known sclerotic osseous metastases are not well visualized. IMPRESSION: 1. No acute finding after left thoracentesis. 2. Left basilar atelectasis. Electronically Signed   By: Monte Fantasia M.D.   On: 06/07/2016 12:58   Dg Lumbar Spine Complete  06/07/2016  CLINICAL DATA:  Low back pain EXAM: LUMBAR SPINE - COMPLETE 4+ VIEW COMPARISON:  04/29/2014 FINDINGS: Scattered large  and small bowel gas is noted. Fecal material is noted throughout the colon. A 12 mm triangular shaped density is noted in the lower pole of the right kidney consistent with a nonobstructing stone. There is fullness of the collecting system and ureter on  the left suspicious for a distal ureteral calculus. Significant trabeculation of the bladder is noted. Multilevel degenerative changes are seen with osteophytic change and disc space narrowing. Facet hypertrophic changes are noted. No compression deformities are seen. Diffuse aortic calcifications are noted IMPRESSION: Increase in size in the right lower pole renal calculus. Changes suggestive of at least partial ureteral obstruction on the left. Further evaluation is recommended. This may contribute to the patient's underlying back pain. Degenerative changes of the lumbar spine without acute abnormality. Increased trabeculation of the bladder which may be related to bladder outlet obstruction. Electronically Signed   By: Inez Catalina M.D.   On: 06/07/2016 09:41   US Thoracentesis Asp Pleural Space W/img Guide  06/07/2016  INDICATION: Left pleural effusion. EXAM: ULTRASOUND GUIDED left THORACENTESIS MEDICATIONS: None. COMPLICATIONS: None immediate. PROCEDURE: An ultrasound guided thoracentesis was thoroughly discussed with the patient and questions answered. The benefits, risks, alternatives and complications were also discussed. The patient understands and wishes to proceed with the procedure. Written consent was obtained. Ultrasound was performed to localize and mark an adequate pocket of fluid in the left chest. The area was then prepped and draped in the normal sterile fashion. 1% Lidocaine was used for local anesthesia. Under ultrasound guidance a Safe-T-Centesis catheter was introduced. Thoracentesis was performed. The catheter was removed and a dressing applied. FINDINGS: A total of approximately 900 mL of serous fluid was removed. Samples were sent to the  laboratory as requested by the clinical team. IMPRESSION: Successful ultrasound guided left thoracentesis yielding 900 mL of pleural fluid. Electronically Signed   By: Marijo Conception, M.D.   On: 06/07/2016 13:10    Management plans discussed with the patient, family and they are in agreement.  CODE STATUS:     Code Status Orders        Start     Ordered   06/06/16 1810  Full code   Continuous     06/06/16 1811    Code Status History    Date Active Date Inactive Code Status Order ID Comments User Context   05/08/2016  1:03 AM 05/10/2016 10:53 AM Full Code [id number]  Quintella Baton, MD Inpatient    Advance Directive Documentation        Most Recent Value   Type of Advance Directive  Healthcare Power of Attorney   Pre-existing out of facility DNR order (yellow form or pink MOST form)     "MOST" Form in Place?        TOTAL TIME TAKING CARE OF THIS PATIENT: 35 minutes.    Loletha Grayer M.D on 06/08/2016 at 5:22 PM  Between 7am to 6pm - Pager - [phone]  After 6pm go to [email or web address] - Proofreader  [employer] Office  [phone]  CC: Primary care physician; Coral Spikes, DO

## 2016-06-08 NOTE — Progress Notes (Signed)
Discussed discharge instructions and medications with pt and his wife.  IVs removed. No questions at this time.  Pt transported home via car by his wife and daughter.  Clarise Cruz, RN

## 2016-06-08 NOTE — Progress Notes (Signed)
SATURATION QUALIFICATIONS: (This note is used to comply with regulatory documentation for home oxygen)  Patient Saturations on Room Air at Rest = 92%  Patient Saturations on Room Air while Ambulating = 95%   

## 2016-06-09 ENCOUNTER — Other Ambulatory Visit: Payer: Self-pay

## 2016-06-09 ENCOUNTER — Telehealth: Payer: Self-pay

## 2016-06-09 LAB — CHOLESTEROL, BODY FLUID: Cholesterol, Fluid: 34 mg/dL

## 2016-06-09 LAB — TRIGLYCERIDES, BODY FLUIDS: Triglycerides, Fluid: 19 mg/dL

## 2016-06-09 NOTE — Telephone Encounter (Signed)
Transition Care Management Follow-up Telephone Call   Date discharged? 06/08/16   How have you been since you were released from the hospital? [place] SOB.  NO DIZZINESS. NO PAIN. NO HOME OXYGEN.  EATING/DRINKING WITHOUT ISSUES. NO FALLS.   Do you understand why you were in the hospital? [place].   Do you understand the discharge instructions? YES, INCREASE ACTIVITY SLOWLY AND AS TOLERATED.  FOLLOW UP WITH IMMUNOTHERAPY AND PCP.   Where were you discharged to? HOME.   Items Reviewed:  Medications reviewed: YES, STOPPED TAKING DIOVAN 80-12.5MG .  STARTED TAKING LASIX 20MG , MIRALAX, TRAMADOL 50MG .  CONTINUING ALL OTHER SCHEDULED MEDICATIONS AS DIRECTED.  Allergies reviewed: YES, CIPROLFLAXIN, CODEINE.  Dietary changes reviewed: YES, REGULAR DIET.    Referrals reviewed: YES, APPOINTMENTS SCHEDULED WITH [facility] FOR IMMUNOTHERAPY AND PCP.   Functional Questionnaire:   Activities of Daily Living (ADLs):   He states they are independent in the following: TOILETING, GROOMING, SELF FEEDING. States they require assistance with the following:AMBULATING (CANE/WALKER IN USE), BATHING, DRESSING, MEAL PREP. WIFE ASSISTS.    Any transportation issues/concerns?: NO.   Any patient concerns? NO.   Confirmed importance and date/time of follow-up visits scheduled YES, APPOINTMENT SCHEDULED 06/15/16 AT 11:30.  Provider Appointment booked with Dr. Lacinda Axon (PCP).  Confirmed with patient if condition begins to worsen call PCP or go to the ER.  Patient was given the office number and encouraged to call back with question or concerns.  : YES, VERBALIZED UNDERSTANDING.

## 2016-06-11 ENCOUNTER — Emergency Department: Payer: Medicare Other

## 2016-06-11 ENCOUNTER — Inpatient Hospital Stay
Admission: EM | Admit: 2016-06-11 | Discharge: 2016-06-15 | DRG: 193 | Disposition: A | Discharge status: Skilled Nursing Facility | Payer: Medicare Other | Attending: Internal Medicine | Admitting: Internal Medicine

## 2016-06-11 ENCOUNTER — Encounter: Payer: Self-pay | Admitting: Internal Medicine

## 2016-06-11 DIAGNOSIS — J189 Pneumonia, unspecified organism: Secondary | ICD-10-CM | POA: Diagnosis present

## 2016-06-11 DIAGNOSIS — C779 Secondary and unspecified malignant neoplasm of lymph node, unspecified: Secondary | ICD-10-CM | POA: Diagnosis not present

## 2016-06-11 DIAGNOSIS — R0600 Dyspnea, unspecified: Secondary | ICD-10-CM

## 2016-06-11 DIAGNOSIS — Z951 Presence of aortocoronary bypass graft: Secondary | ICD-10-CM

## 2016-06-11 DIAGNOSIS — D649 Anemia, unspecified: Secondary | ICD-10-CM | POA: Diagnosis not present

## 2016-06-11 DIAGNOSIS — C7951 Secondary malignant neoplasm of bone: Secondary | ICD-10-CM | POA: Diagnosis not present

## 2016-06-11 DIAGNOSIS — J90 Pleural effusion, not elsewhere classified: Secondary | ICD-10-CM

## 2016-06-11 DIAGNOSIS — G9341 Metabolic encephalopathy: Secondary | ICD-10-CM | POA: Diagnosis present

## 2016-06-11 DIAGNOSIS — I2510 Atherosclerotic heart disease of native coronary artery without angina pectoris: Secondary | ICD-10-CM | POA: Diagnosis present

## 2016-06-11 DIAGNOSIS — C439 Malignant melanoma of skin, unspecified: Secondary | ICD-10-CM | POA: Diagnosis present

## 2016-06-11 DIAGNOSIS — E119 Type 2 diabetes mellitus without complications: Secondary | ICD-10-CM | POA: Diagnosis present

## 2016-06-11 DIAGNOSIS — D696 Thrombocytopenia, unspecified: Secondary | ICD-10-CM | POA: Diagnosis present

## 2016-06-11 DIAGNOSIS — G250 Essential tremor: Secondary | ICD-10-CM | POA: Diagnosis present

## 2016-06-11 DIAGNOSIS — N400 Enlarged prostate without lower urinary tract symptoms: Secondary | ICD-10-CM | POA: Diagnosis present

## 2016-06-11 DIAGNOSIS — Z79899 Other long term (current) drug therapy: Secondary | ICD-10-CM | POA: Diagnosis not present

## 2016-06-11 DIAGNOSIS — D638 Anemia in other chronic diseases classified elsewhere: Secondary | ICD-10-CM | POA: Diagnosis present

## 2016-06-11 DIAGNOSIS — R0902 Hypoxemia: Secondary | ICD-10-CM | POA: Diagnosis present

## 2016-06-11 DIAGNOSIS — Z8261 Family history of arthritis: Secondary | ICD-10-CM | POA: Diagnosis not present

## 2016-06-11 DIAGNOSIS — R410 Disorientation, unspecified: Secondary | ICD-10-CM

## 2016-06-11 DIAGNOSIS — Z794 Long term (current) use of insulin: Secondary | ICD-10-CM | POA: Diagnosis not present

## 2016-06-11 DIAGNOSIS — C7911 Secondary malignant neoplasm of bladder: Secondary | ICD-10-CM | POA: Diagnosis not present

## 2016-06-11 DIAGNOSIS — Z833 Family history of diabetes mellitus: Secondary | ICD-10-CM | POA: Diagnosis not present

## 2016-06-11 DIAGNOSIS — E871 Hypo-osmolality and hyponatremia: Secondary | ICD-10-CM | POA: Diagnosis present

## 2016-06-11 DIAGNOSIS — E785 Hyperlipidemia, unspecified: Secondary | ICD-10-CM | POA: Diagnosis present

## 2016-06-11 DIAGNOSIS — C7952 Secondary malignant neoplasm of bone marrow: Secondary | ICD-10-CM | POA: Diagnosis present

## 2016-06-11 DIAGNOSIS — R531 Weakness: Secondary | ICD-10-CM

## 2016-06-11 DIAGNOSIS — Z8249 Family history of ischemic heart disease and other diseases of the circulatory system: Secondary | ICD-10-CM

## 2016-06-11 HISTORY — DX: Malignant (primary) neoplasm, unspecified: C80.1

## 2016-06-11 LAB — URINALYSIS COMPLETE WITH MICROSCOPIC (ARMC ONLY)
BILIRUBIN URINE: NEGATIVE
Bacteria, UA: NONE SEEN
GLUCOSE, UA: 50 mg/dL — AB
KETONES UR: NEGATIVE mg/dL
Nitrite: NEGATIVE
PH: 5 (ref 5.0–8.0)
Protein, ur: NEGATIVE mg/dL
Specific Gravity, Urine: 1.017 (ref 1.005–1.030)

## 2016-06-11 LAB — CBC WITH DIFFERENTIAL/PLATELET
BAND NEUTROPHILS: 8 %
BASOS ABS: 0 10*3/uL (ref 0–0.1)
BASOS PCT: 0 %
Blasts: 0 %
EOS ABS: 0 10*3/uL (ref 0–0.7)
Eosinophils Relative: 0 %
HCT: 21.7 % — ABNORMAL LOW (ref 40.0–52.0)
Hemoglobin: 7.2 g/dL — ABNORMAL LOW (ref 13.0–18.0)
Lymphocytes Relative: 11 %
Lymphs Abs: 0.8 10*3/uL — ABNORMAL LOW (ref 1.0–3.6)
MCH: 28 pg (ref 26.0–34.0)
MCHC: 33.2 g/dL (ref 32.0–36.0)
MCV: 84.3 fL (ref 80.0–100.0)
MONO ABS: 0.5 10*3/uL (ref 0.2–1.0)
MYELOCYTES: 2 %
Metamyelocytes Relative: 12 %
Monocytes Relative: 7 %
NEUTROS PCT: 59 %
Neutro Abs: 5.8 10*3/uL (ref 1.4–6.5)
Other: 1 %
PLATELETS: 44 10*3/uL — AB (ref 150–440)
PROMYELOCYTES ABS: 0 %
RBC: 2.57 MIL/uL — ABNORMAL LOW (ref 4.40–5.90)
RDW: 18.4 % — AB (ref 11.5–14.5)
WBC: 7.1 10*3/uL (ref 3.8–10.6)
nRBC: 8 /100 WBC — ABNORMAL HIGH

## 2016-06-11 LAB — COMPREHENSIVE METABOLIC PANEL
ALT: 16 U/L — ABNORMAL LOW (ref 17–63)
AST: 43 U/L — ABNORMAL HIGH (ref 15–41)
Albumin: 1.9 g/dL — ABNORMAL LOW (ref 3.5–5.0)
Alkaline Phosphatase: 233 U/L — ABNORMAL HIGH (ref 38–126)
Anion gap: 8 (ref 5–15)
BILIRUBIN TOTAL: 1.1 mg/dL (ref 0.3–1.2)
BUN: 38 mg/dL — AB (ref 6–20)
CHLORIDE: 96 mmol/L — AB (ref 101–111)
CO2: 26 mmol/L (ref 22–32)
CREATININE: 0.84 mg/dL (ref 0.61–1.24)
Calcium: 7.6 mg/dL — ABNORMAL LOW (ref 8.9–10.3)
Glucose, Bld: 217 mg/dL — ABNORMAL HIGH (ref 65–99)
POTASSIUM: 4.1 mmol/L (ref 3.5–5.1)
Sodium: 130 mmol/L — ABNORMAL LOW (ref 135–145)
TOTAL PROTEIN: 4.8 g/dL — AB (ref 6.5–8.1)

## 2016-06-11 LAB — GLUCOSE, CAPILLARY
Glucose-Capillary: 199 mg/dL — ABNORMAL HIGH (ref 65–99)
Glucose-Capillary: 217 mg/dL — ABNORMAL HIGH (ref 65–99)

## 2016-06-11 LAB — TROPONIN I

## 2016-06-11 LAB — CULTURE, BLOOD (ROUTINE X 2)
CULTURE: NO GROWTH
Culture: NO GROWTH

## 2016-06-11 LAB — PREPARE RBC (CROSSMATCH)

## 2016-06-11 MED ORDER — SODIUM CHLORIDE 0.9% FLUSH
3.0000 mL | Freq: Two times a day (BID) | INTRAVENOUS | Status: DC
  Administered 2016-06-11 – 2016-06-15 (×4): 3 mL via INTRAVENOUS

## 2016-06-11 MED ORDER — SODIUM CHLORIDE 0.9 % IV SOLN
Freq: Once | INTRAVENOUS | Status: AC
  Administered 2016-06-11: 22:00:00 via INTRAVENOUS

## 2016-06-11 MED ORDER — TRAMADOL HCL 50 MG PO TABS
50.0000 mg | ORAL_TABLET | Freq: Three times a day (TID) | ORAL | Status: DC | PRN
  Administered 2016-06-11 – 2016-06-14 (×3): 50 mg via ORAL
  Filled 2016-06-11 (×3): qty 1

## 2016-06-11 MED ORDER — NITROFURANTOIN MONOHYD MACRO 100 MG PO CAPS
100.0000 mg | ORAL_CAPSULE | Freq: Every day | ORAL | Status: DC
  Administered 2016-06-11: 21:00:00 100 mg via ORAL
  Filled 2016-06-11 (×3): qty 1

## 2016-06-11 MED ORDER — ONDANSETRON HCL 4 MG/2ML IJ SOLN
4.0000 mg | Freq: Four times a day (QID) | INTRAMUSCULAR | Status: DC | PRN
  Administered 2016-06-12: 05:00:00 4 mg via INTRAVENOUS
  Filled 2016-06-11: qty 2

## 2016-06-11 MED ORDER — ONDANSETRON HCL 4 MG PO TABS: 4.0000 mg | ORAL_TABLET | Freq: Four times a day (QID) | ORAL | Status: DC | PRN

## 2016-06-11 MED ORDER — ACETAMINOPHEN 325 MG PO TABS
650.0000 mg | ORAL_TABLET | Freq: Four times a day (QID) | ORAL | Status: DC | PRN
  Filled 2016-06-11: qty 2

## 2016-06-11 MED ORDER — INSULIN ASPART 100 UNIT/ML ~~LOC~~ SOLN
0.0000 [IU] | Freq: Three times a day (TID) | SUBCUTANEOUS | Status: DC
  Administered 2016-06-12: 5 [IU] via SUBCUTANEOUS
  Administered 2016-06-12: 09:00:00 3 [IU] via SUBCUTANEOUS
  Administered 2016-06-12 – 2016-06-13 (×2): 5 [IU] via SUBCUTANEOUS
  Administered 2016-06-13 (×2): 3 [IU] via SUBCUTANEOUS
  Administered 2016-06-14 (×2): 2 [IU] via SUBCUTANEOUS
  Filled 2016-06-11: qty 5
  Filled 2016-06-11 (×2): qty 2
  Filled 2016-06-11: qty 3
  Filled 2016-06-11 (×2): qty 5
  Filled 2016-06-11 (×2): qty 3

## 2016-06-11 MED ORDER — ATORVASTATIN CALCIUM 10 MG PO TABS
10.0000 mg | ORAL_TABLET | Freq: Every day | ORAL | Status: DC
  Administered 2016-06-11 – 2016-06-14 (×4): 10 mg via ORAL
  Filled 2016-06-11 (×4): qty 1

## 2016-06-11 MED ORDER — FAMOTIDINE IN NACL 20-0.9 MG/50ML-% IV SOLN
20.0000 mg | Freq: Two times a day (BID) | INTRAVENOUS | Status: DC
  Administered 2016-06-11 – 2016-06-12 (×2): 20 mg via INTRAVENOUS
  Filled 2016-06-11 (×3): qty 50

## 2016-06-11 MED ORDER — DULOXETINE HCL 30 MG PO CPEP
30.0000 mg | ORAL_CAPSULE | Freq: Every day | ORAL | Status: DC
  Administered 2016-06-11 – 2016-06-14 (×4): 30 mg via ORAL
  Filled 2016-06-11 (×4): qty 1

## 2016-06-11 MED ORDER — SODIUM CHLORIDE 0.9 % IV SOLN
INTRAVENOUS | Status: DC
  Administered 2016-06-11 – 2016-06-14 (×3): via INTRAVENOUS

## 2016-06-11 MED ORDER — BISACODYL 10 MG RE SUPP: 10.0000 mg | Freq: Every day | RECTAL | Status: DC | PRN

## 2016-06-11 MED ORDER — MORPHINE SULFATE (PF) 2 MG/ML IV SOLN: 1.0000 mg | INTRAVENOUS | Status: DC | PRN

## 2016-06-11 MED ORDER — VITAMIN D 1000 UNITS PO TABS
1000.0000 [IU] | ORAL_TABLET | Freq: Every day | ORAL | Status: DC
  Administered 2016-06-11 – 2016-06-15 (×5): 1000 [IU] via ORAL
  Filled 2016-06-11 (×5): qty 1

## 2016-06-11 MED ORDER — ACETAMINOPHEN 650 MG RE SUPP: 650.0000 mg | Freq: Four times a day (QID) | RECTAL | Status: DC | PRN

## 2016-06-11 MED ORDER — TAMSULOSIN HCL 0.4 MG PO CAPS
0.4000 mg | ORAL_CAPSULE | Freq: Every day | ORAL | Status: DC
  Administered 2016-06-12 – 2016-06-15 (×4): 0.4 mg via ORAL
  Filled 2016-06-11 (×4): qty 1

## 2016-06-11 MED ORDER — VITAMIN B-12 1000 MCG PO TABS
1000.0000 ug | ORAL_TABLET | Freq: Every day | ORAL | Status: DC
  Administered 2016-06-11 – 2016-06-15 (×5): 1000 ug via ORAL
  Filled 2016-06-11 (×5): qty 1

## 2016-06-11 MED ORDER — DOCUSATE SODIUM 100 MG PO CAPS
100.0000 mg | ORAL_CAPSULE | Freq: Two times a day (BID) | ORAL | Status: DC
  Administered 2016-06-11 – 2016-06-15 (×7): 100 mg via ORAL
  Filled 2016-06-11 (×7): qty 1

## 2016-06-11 MED ORDER — PRIMIDONE 50 MG PO TABS
50.0000 mg | ORAL_TABLET | Freq: Every day | ORAL | Status: DC
  Administered 2016-06-11 – 2016-06-15 (×5): 50 mg via ORAL
  Filled 2016-06-11 (×5): qty 1

## 2016-06-11 MED ORDER — INSULIN ASPART PROT & ASPART (70-30 MIX) 100 UNIT/ML ~~LOC~~ SUSP
20.0000 [IU] | Freq: Two times a day (BID) | SUBCUTANEOUS | Status: DC
  Administered 2016-06-11 – 2016-06-15 (×8): 20 [IU] via SUBCUTANEOUS
  Filled 2016-06-11 (×8): qty 20

## 2016-06-11 MED ORDER — DEXTROSE 5 % IV SOLN
1.0000 g | Freq: Every day | INTRAVENOUS | Status: DC
  Administered 2016-06-11 – 2016-06-15 (×5): 1 g via INTRAVENOUS
  Filled 2016-06-11 (×5): qty 10

## 2016-06-11 NOTE — H&P (Signed)
History and Physical    ALVOID COYT [id number] DOB: January 25, 1941 DOA: 06/11/2016  Referring physician: Dr. Clearnce Hasten PCP: Coral Spikes, DO  Specialists: none  Chief Complaint: weakness  HPI: FADY HALES is a 75 y.o. male has a past medical history significant for melanoma, ASCVD, and anemia of chronic disease now with severe fatigue and weakness. Unable to stand. Some worsening SOB. Was hospitalized recently for pleural effusion and anemia. Presents to ER today with recurrent pleural effusion and worsening anemia with mild hypoxia. Denies CP. He is SOB. He is now admitted. Denies N/V/D.  Review of Systems: The patient denies anorexia, fever, weight loss,, vision loss, decreased hearing, hoarseness, chest pain, syncope, peripheral edema, balance deficits, hemoptysis, abdominal pain, melena, hematochezia, severe indigestion/heartburn, hematuria, incontinence, genital sores, suspicious skin lesions, transient blindness, , depression, unusual weight change, abnormal bleeding, enlarged lymph nodes, angioedema, and breast masses.   Past Medical History  Diagnosis Date  . Insulin dependent diabetes mellitus ([place])   . Arteriosclerotic coronary artery disease   . Peyronie's disease   . Prostatitis   . Benign essential tremor   . Hyperlipidemia   . Bladder diverticulum   . Melanoma [facility])    Past Surgical History  Procedure Laterality Date  . Cholecystectomy  1973  . 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 He has never used smokeless tobacco. He reports that he drinks alcohol. He reports that he does not use illicit drugs.  Allergies  Allergen Reactions  . Ciprofloxacin Nausea And Vomiting and Other (See Comments)    Reaction:  Shaking   . Codeine Other (See Comments)    Reaction:  Cramping     Family History  Problem Relation Age of Onset  . Arthritis Mother   . Heart disease Mother   . Heart attack  Mother   . Diabetes Sister   . Heart disease Brother   . Diabetes Brother     Prior to Admission medications   Medication Sig Start Date End Date Taking? Authorizing Provider  atorvastatin (LIPITOR) 10 MG tablet Take 10 mg by mouth daily.    Historical Provider, MD  cholecalciferol (VITAMIN D) 1000 UNITS tablet Take 1,000 Units by mouth daily.      Historical Provider, MD  docusate sodium (COLACE) 100 MG capsule Take 100 mg by mouth daily.    Historical Provider, MD  furosemide (LASIX) 20 MG tablet Take one tab po daily as needed for swelling or shortness of breath 06/08/16   Loletha Grayer, MD  insulin lispro protamine-lispro (HUMALOG MIX 75/25) (75-25) 100 UNIT/ML SUSP injection Inject 20-30 Units into the skin 2 (two) times daily with a meal. Pt uses per sliding scale.    Historical Provider, MD  nitrofurantoin, macrocrystal-monohydrate, (MACROBID) 100 MG capsule Take 100 mg by mouth daily.    Historical Provider, MD  polyethylene glycol (MIRALAX / GLYCOLAX) packet Take 17 g by mouth daily as needed for mild constipation. 06/08/16   Loletha Grayer, MD  primidone (MYSOLINE) 50 MG tablet Take 50 mg by mouth daily.     Historical Provider, MD  tamsulosin (FLOMAX) 0.4 MG CAPS capsule Take 0.4 mg by mouth daily after breakfast.     Historical Provider, MD  traMADol (ULTRAM) 50 MG tablet Take 1 tablet (50 mg total) by mouth every 8 (eight) hours as needed for moderate pain. 06/08/16   Loletha Grayer, MD  Turmeric  500 MG CAPS Take 500 mg by mouth daily.    Historical Provider, MD  vitamin B-12 (CYANOCOBALAMIN) 1000 MCG tablet Take 1,000 mcg by mouth daily.    Historical Provider, MD   Physical Exam: Filed Vitals:   06/11/16 1300 06/11/16 1330 06/11/16 1400 06/11/16 1430  BP: 101/61 103/52 111/57 119/56  Pulse: 99 96 93 94  Temp:      TempSrc:      Resp: 19 26 21 24   Height:      Weight:      SpO2: 100% 99% 100% 100%     General:  WDWN in mild distress. [place]/AT  Eyes: PERRL, EOMI, no  scleral icterus, conjunctiva pale  ENT: moist oropharynx without lesions or exudate, TM's benign, dentition fair  Neck: supple, no lymphadenopathy. No bruits or thyromegaly  Cardiovascular: regular rate without MRG; 2+ peripheral pulses, no JVD, trace peripheral edema  Respiratory: decreased breath sounds at the bases, L>R. No wheezes or rales. Respiratory effort increased  Abdomen: soft, non tender to palpation, positive bowel sounds, no guarding, no rebound  Skin: no rashes or lesions  Musculoskeletal: normal bulk and tone, no joint swelling  Psychiatric: normal mood and affect, A&OX3  Neurologic: CN 2-12 grossly intact, Motor strength 5/5 in all 4 groups with symmetric DTR's and non-focal sensory exam  Labs on Admission:  Basic Metabolic Panel:  Recent Labs Lab 06/06/16 1524 06/07/16 0416 06/08/16 0433 06/11/16 1036  NA 131* 133* 133* 130*  K 3.8 4.0 4.0 4.1  CL 95* 99* 100* 96*  CO2 24 26 26 26   GLUCOSE 156* 125* 122* 217*  BUN 29* 30* 30* 38*  CREATININE 0.93 0.89 0.93 0.84  CALCIUM 7.8* 7.6* 7.8* 7.6*   Liver Function Tests:  Recent Labs Lab 06/06/16 1524 06/11/16 1036  AST 51* 43*  ALT 15* 16*  ALKPHOS 137* 233*  BILITOT 1.1 1.1  PROT 4.8* 4.8*  ALBUMIN 2.1* 1.9*   No results for input(s): LIPASE, AMYLASE in the last 168 hours. No results for input(s): AMMONIA in the last 168 hours. CBC:  Recent Labs Lab 06/06/16 1524 06/07/16 0416 06/08/16 0433 06/11/16 1036  WBC 5.3 3.9 4.1 7.1  NEUTROABS  --   --   --  5.8  HGB 9.7* 8.2* 8.6* 7.2*  HCT 28.7* 24.8* 26.2* 21.7*  MCV 84.9 85.4 84.3 84.3  PLT 64* 47* 48* 44*   Cardiac Enzymes:  Recent Labs Lab 06/06/16 1524 06/11/16 1036  TROPONINI <0.03 <0.03    BNP (last 3 results)  Recent Labs  06/06/16 1524  BNP 73.0    ProBNP (last 3 results)  Recent Labs  01/11/16 1217  PROBNP 137.0*    CBG:  Recent Labs Lab 06/07/16 1117 06/07/16 1625 06/07/16 1958 06/08/16 0734  06/08/16 1124  GLUCAP 155* 189* 149* 120* 151*    Radiological Exams on Admission: Dg Chest 2 View  06/11/2016  CLINICAL DATA:  Shortness of Breath EXAM: CHEST  2 VIEW COMPARISON:  06/07/2016 FINDINGS: Cardiac shadow is within normal limits. Postoperative changes are noted. Increasing left-sided pleural effusion is noted with likely underlying atelectasis. A smaller right pleural effusion is noted as well. IMPRESSION: New effusions bilaterally left greater than right with associated left basilar atelectasis. Electronically Signed   By: Inez Catalina M.D.   On: 06/11/2016 12:14    EKG: Independently reviewed.  Assessment/Plan Principal Problem:   Symptomatic anemia Active Problems:   Weakness   Hypoxia   Pleural effusion, bilateral   Will admit to  floor and supplement O2. Consult Oncology and Surgery for possible thoracentesis. Transfuse 2u PRBC's. Consider IV Albumin. Repeat labs and CXR in AM. Consult PT and CSW. Begin empiric IV ABX.  Diet: soft Fluids: NS@75   DVT Prophylaxis: SQ Heparin  Code Status: FULL Family Communication: yes Disposition Plan: SNF  Time spent: 55 min

## 2016-06-11 NOTE — ED Provider Notes (Signed)
[facility] Emergency Department Provider Note   ____________________________________________  Time seen: Approximately 3382 AM  I have reviewed the triage vital signs and the nursing notes.   HISTORY  Chief Complaint Shortness of Breath   HPI [name] [name] is a 75 y.o. male with a history of multiple myeloma on immunotherapy at [place] who is presenting to the emergency department today with weakness and shortness of breath. He was recently admitted to the hospital and had a left-sided thoracentesis. He says that he did feel better after this procedure but began feeling short of breath associated home which is worsened in addition to weakness. He was barely able to walk yesterday because of the weakness in his lower extremities. There was some thought that this was due to tramadol but he didn't take a second dose of tramadol yesterday afternoon and didn't take any this morning and has been increasingly weak with shortness of breath. He denies any pain. No cough or fever. The symptoms are reminiscent of past episodes of anemia. The patient denies any blood in the stool. Says that he has not a bowel movement in several days.Patient said he vomited one time this morning after exerting himself and becoming very short of breath.   Past Medical History  Diagnosis Date  . Insulin dependent diabetes mellitus ([place])   . Arteriosclerotic coronary artery disease   . Peyronie's disease   . Prostatitis   . Benign essential tremor   . Hyperlipidemia   . Bladder diverticulum   . Melanoma [facility])     Patient Active Problem List   Diagnosis Date Noted  . Hypoxia 06/06/2016  . UTI (lower urinary tract infection) 05/07/2016  . Weakness 05/07/2016  . Melanoma ([place]) 05/07/2016  . Diabetes mellitus ([place]) 05/07/2016  . Metastatic melanoma ([place]) 04/25/2016  . Prostate cancer ([place]) 01/12/2016  . Venous insufficiency of leg 09/23/2015  . CAD (coronary artery disease) 06/30/2015    . Essential hypertension 06/30/2015  . Essential tremor 06/30/2015  . Preventative health care 06/30/2015  . Carotid artery disease ([place]) 10/02/2014  . Hyperlipidemia   . Type 2 diabetes mellitus with complications [facility])     Past Surgical History  Procedure Laterality Date  . Cholecystectomy  1973  . 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 atorvastatin (LIPITOR) 10 MG tablet   Oral   Take 10 mg by mouth daily.         . cholecalciferol (VITAMIN D) 1000 UNITS tablet   Oral   Take 1,000 Units by mouth daily.           Marland Kitchen docusate sodium (COLACE) 100 MG capsule   Oral   Take 100 mg by mouth daily.         . furosemide (LASIX) 20 MG tablet      Take one tab po daily as needed for swelling or shortness of breath   30 tablet   0   . insulin lispro protamine-lispro (HUMALOG MIX 75/25) (75-25) 100 UNIT/ML SUSP injection   Subcutaneous   Inject 20-30 Units into the skin 2 (two) times daily with a meal. Pt uses per sliding scale.         . nitrofurantoin, macrocrystal-monohydrate, (MACROBID) 100 MG capsule   Oral   Take 100 mg by mouth daily.         Marland Kitchen  polyethylene glycol (MIRALAX / GLYCOLAX) packet   Oral   Take 17 g by mouth daily as needed for mild constipation.   14 each   0   . primidone (MYSOLINE) 50 MG tablet   Oral   Take 50 mg by mouth daily.          . tamsulosin (FLOMAX) 0.4 MG CAPS capsule   Oral   Take 0.4 mg by mouth daily after breakfast.          . traMADol (ULTRAM) 50 MG tablet   Oral   Take 1 tablet (50 mg total) by mouth every 8 (eight) hours as needed for moderate pain.   30 tablet   0   . Turmeric 500 MG CAPS   Oral   Take 500 mg by mouth daily.         . vitamin B-12 (CYANOCOBALAMIN) 1000 MCG tablet   Oral   Take 1,000 mcg by mouth daily.           Allergies Ciprofloxacin and Codeine  Family History  Problem Relation Age of  Onset  . Arthritis Mother   . Heart disease Mother   . Heart attack Mother   . Diabetes Sister   . Heart disease Brother   . Diabetes Brother     Social History Social History  Substance Use Topics  . Smoking status: Never Smoker   . Smokeless tobacco: Never Used  . Alcohol Use: Yes    Review of Systems Constitutional: No fever/chills Eyes: No visual changes. ENT: No sore throat. Cardiovascular: Denies chest pain. Respiratory:  Gastrointestinal: No abdominal pain.  No diarrhea.  No constipation. Genitourinary: Negative for dysuria. Musculoskeletal: Negative for back pain. Skin: Negative for rash. Neurological: Negative for headaches, focal weakness or numbness.  10-point ROS otherwise negative.  ____________________________________________   PHYSICAL EXAM:  VITAL SIGNS: ED Triage Vitals  Enc Vitals Group     BP 06/11/16 1029 122/64 mmHg     Pulse Rate 06/11/16 1029 98     Resp --      Temp 06/11/16 1029 97.8 F (36.6 C)     Temp Source 06/11/16 1029 Oral     SpO2 06/11/16 1029 99 %     Weight 06/11/16 1029 195 lb (88.451 kg)     Height 06/11/16 1029 '5\' 11"'$  (1.803 m)     Head Cir --      Peak Flow --      Pain Score 06/11/16 1031 0     Pain Loc --      Pain Edu? --      Excl. in [place]? --     Constitutional: Alert and oriented. Ill appearing and in no acute distress. Eyes: Conjunctivae are normal. PERRL. EOMI. Head: Atraumatic. Nose: No congestion/rhinnorhea.Wearing nasal cannula oxygen. Not on home O2. Mouth/Throat: Mucous membranes are moist.   Neck: No stridor.   Cardiovascular: Tachycardia, regular rhythm. Grossly normal heart sounds.   Respiratory: Mild tachypnea especially after speaking. Bilateral lung sounds are reduced to the lower fields. Gastrointestinal: Soft and nontender. No distention.  Musculoskeletal:  Lower extremity edema which is chronic and unchanged patient had bypass surgery in the 90s. Neurologic:  Normal speech and language. No  gross focal neurologic deficits are appreciated.  Skin:  Skin is warm, dry and intact. No rash noted.  Left heel with small ulceration which appears well-healed with pink granulation tissue. Psychiatric: Mood and affect are normal. Speech and behavior are normal.  ____________________________________________   LABS (  all labs ordered are listed, but only abnormal results are displayed)  Labs Reviewed  CBC WITH DIFFERENTIAL/PLATELET - Abnormal; Notable for the following:    RBC 2.57 (*)    Hemoglobin 7.2 (*)    HCT 21.7 (*)    RDW 18.4 (*)    Platelets 44 (*)    nRBC 8 (*)    Lymphs Abs 0.8 (*)    All other components within normal limits  COMPREHENSIVE METABOLIC PANEL - Abnormal; Notable for the following:    Sodium 130 (*)    Chloride 96 (*)    Glucose, Bld 217 (*)    BUN 38 (*)    Calcium 7.6 (*)    Total Protein 4.8 (*)    Albumin 1.9 (*)    AST 43 (*)    ALT 16 (*)    Alkaline Phosphatase 233 (*)    All other components within normal limits  TROPONIN I  URINALYSIS COMPLETEWITH MICROSCOPIC ([facility] ONLY)  PATHOLOGIST SMEAR REVIEW  TYPE AND SCREEN  PREPARE RBC (CROSSMATCH)   ____________________________________________  EKG  ED ECG REPORT I, Doran Stabler, the attending physician, personally viewed and interpreted this ECG.   Date: 06/11/2016  EKG Time: 1128  Rate: 110  Rhythm: sinus tachycardia  Axis: Normal axis  Intervals:none  ST&T Change: No ST segment elevation or depression. No abnormal T-wave inversion.  ____________________________________________  [id number]  DG Chest 2 View (Final result) Result time: 06/11/16 12:14:50   Final result by Rad Results In Interface (06/11/16 12:14:50)   Narrative:   CLINICAL DATA: Shortness of Breath  EXAM: CHEST 2 VIEW  COMPARISON: 06/07/2016  FINDINGS: Cardiac shadow is within normal limits. Postoperative changes are noted. Increasing left-sided pleural effusion is noted with likely underlying  atelectasis. A smaller right pleural effusion is noted as well.  IMPRESSION: New effusions bilaterally left greater than right with associated left basilar atelectasis.   Electronically Signed By: Inez Catalina M.D. On: 06/11/2016 12:14       ____________________________________________   PROCEDURES ___________________________________________   INITIAL IMPRESSION / ASSESSMENT AND PLAN / ED COURSE  Pertinent labs & imaging results that were available during my care of the patient were reviewed by me and considered in my medical decision making (see chart for details).  ----------------------------------------- 2:39 PM on 06/11/2016 -----------------------------------------  Patient found to be anemic at 7.2. Denies any bleeding from his rectum. 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 Also with thrombocytopenia which appears chronic as well. Will be admitted to the hospital. Signed out to Dr. Doy Hutching. ____________________________________________   FINAL CLINICAL IMPRESSION(S) / ED DIAGNOSES  Acute on chronic anemia. Shortness of breath. bilateral pleural effusions.    NEW MEDICATIONS STARTED DURING THIS VISIT:  New Prescriptions   No medications on file     Note:  This document was prepared using Dragon voice recognition software and may include unintentional dictation errors.    Orbie Pyo, MD 06/11/16 1440

## 2016-06-11 NOTE — Progress Notes (Signed)
Pt admitted from the ED. VSS. Denies pain. Dyspnea at rest. O2 sats in the high 90's on 2L O2 per [place]. 1st blood transfusion infusing.

## 2016-06-11 NOTE — Progress Notes (Signed)
LCSW met with family and assessment was completed. Based on the patients progress the family will assess to see if he would be better off with increased home care vs SNF at this time.  LCSW provided them with information on  SNF facilities and once patient settled they will consult SW on their floor.  [employer] LCSW [phone]

## 2016-06-11 NOTE — Clinical Social Work Note (Signed)
Clinical Social Work Assessment  Patient Details  Name: [name] [name] MRN: [id number] Date of Birth: 1941-11-07  Date of referral:  06/11/16               Reason for consult:  Facility Placement                Permission sought to share information with:  Family Supports, Customer service manager Permission granted to share information::  Yes, Verbal Permission Granted  Name::     Nitesh Pitstick [phone] ( spouse) Michaelangelo Mittelman [phone]( son) Reino Kent  Daughter [phone]  Agency::  yes  Relationship::   family  Contact Information:  Trevone [place]wood [phone] ( spouse) Cabot Cromartie [phone]( son) Reino Kent  Daughter [phone]  Housing/Transportation Living arrangements for the past 2 months:  [place] of Information:  Patient, Adult Children, Spouse Patient Interpreter Needed:  None Criminal Activity/Legal Involvement Pertinent to Current Situation/Hospitalization:  No - Comment as needed Significant Relationships:  Adult Children, Spouse Lives with:  Spouse Do you feel safe going back to the place where you live?  Yes Need for family participation in patient care:  Yes (Comment)  Care giving concerns: family asked questions concerning in home supports and information on SNF. Resource list and SNF resource list left with family.   Social Worker assessment / plan:LCSW met with patient  and his wife and both children. Verbal consent was granted to speak to all family members/SNF facilities. Patient was oriented x4, he is currently attached to [facility] for cancer treatment. He is becoming very weak after only 2 treatments and is unable to walk, shortness of breath, very low hemoglobin and fluid on the lungs. He was fairly independent until 2 weeks ago and know reports he is unable to walk and needs a walker for gait and balance. Pt remains very tired. LCSW provided additional warm blankets as per patients request. Patient has insurance bcbs  and medicare part AB.  Patient has been supported at home with [facility] 2x week for PT and OT. Patients family is undecided on what the patients needs and are considering all option and information was provided.   Employment status:  Retired Forensic scientist:  Chief Operating Officer) PT Recommendations:  Not assessed at this time Information / Referral to community resources:  [place], Other (Comment Required) (In home health supports)  Patient/Family's Response to care: They would like to see pt receiving transfusions to assist with his overall health and question why doctors only transfuse when the patient is below 7.  Patient/Family's Understanding of and Emotional Response to Diagnosis, Current Treatment, and Prognosis: They want the best care for this patient.  Emotional Assessment Appearance:  Appears stated age Attitude/Demeanor/Rapport:   (Pleasant and cooperative) Affect (typically observed):  Calm, Quiet, Adaptable Orientation:  Oriented to Self, Oriented to Place, Oriented to  Time, Oriented to Situation, Fluctuating Orientation (Suspected and/or reported Sundowners) Alcohol / Substance use:  Never Used Psych involvement (Current and /or in the community):  No (Comment)  Discharge Needs  Concerns to be addressed:  Care Coordination Readmission within the last 30 days:  No Current discharge risk:   (Stage 4 Mylenoma) Barriers to Discharge:  Continued Medical Work up   [place], [place], LCSW 06/11/2016, 3:55 PM

## 2016-06-11 NOTE — ED Notes (Signed)
Pt ems from home for shortness of breath. Pt with stage 4 melanoma. Was d/ced from here 6/29. 90% room air for EMS at home

## 2016-06-12 ENCOUNTER — Inpatient Hospital Stay: Payer: Medicare Other

## 2016-06-12 DIAGNOSIS — D509 Iron deficiency anemia, unspecified: Secondary | ICD-10-CM

## 2016-06-12 DIAGNOSIS — D696 Thrombocytopenia, unspecified: Secondary | ICD-10-CM

## 2016-06-12 DIAGNOSIS — R05 Cough: Secondary | ICD-10-CM

## 2016-06-12 DIAGNOSIS — R5383 Other fatigue: Secondary | ICD-10-CM

## 2016-06-12 DIAGNOSIS — C779 Secondary and unspecified malignant neoplasm of lymph node, unspecified: Secondary | ICD-10-CM

## 2016-06-12 DIAGNOSIS — C439 Malignant melanoma of skin, unspecified: Secondary | ICD-10-CM

## 2016-06-12 DIAGNOSIS — C7800 Secondary malignant neoplasm of unspecified lung: Secondary | ICD-10-CM

## 2016-06-12 DIAGNOSIS — E119 Type 2 diabetes mellitus without complications: Secondary | ICD-10-CM

## 2016-06-12 DIAGNOSIS — C7951 Secondary malignant neoplasm of bone: Secondary | ICD-10-CM

## 2016-06-12 DIAGNOSIS — M545 Low back pain: Secondary | ICD-10-CM

## 2016-06-12 DIAGNOSIS — C7911 Secondary malignant neoplasm of bladder: Secondary | ICD-10-CM

## 2016-06-12 DIAGNOSIS — J910 Malignant pleural effusion: Secondary | ICD-10-CM

## 2016-06-12 DIAGNOSIS — D649 Anemia, unspecified: Secondary | ICD-10-CM

## 2016-06-12 DIAGNOSIS — I2510 Atherosclerotic heart disease of native coronary artery without angina pectoris: Secondary | ICD-10-CM

## 2016-06-12 DIAGNOSIS — R0602 Shortness of breath: Secondary | ICD-10-CM

## 2016-06-12 DIAGNOSIS — R630 Anorexia: Secondary | ICD-10-CM

## 2016-06-12 DIAGNOSIS — Z794 Long term (current) use of insulin: Secondary | ICD-10-CM

## 2016-06-12 LAB — COMPREHENSIVE METABOLIC PANEL
ALK PHOS: 248 U/L — AB (ref 38–126)
ALT: 16 U/L — AB (ref 17–63)
ANION GAP: 7 (ref 5–15)
AST: 46 U/L — AB (ref 15–41)
Albumin: 1.9 g/dL — ABNORMAL LOW (ref 3.5–5.0)
BUN: 36 mg/dL — ABNORMAL HIGH (ref 6–20)
CHLORIDE: 98 mmol/L — AB (ref 101–111)
CO2: 26 mmol/L (ref 22–32)
Calcium: 7.5 mg/dL — ABNORMAL LOW (ref 8.9–10.3)
Creatinine, Ser: 0.71 mg/dL (ref 0.61–1.24)
Glucose, Bld: 203 mg/dL — ABNORMAL HIGH (ref 65–99)
POTASSIUM: 4.1 mmol/L (ref 3.5–5.1)
SODIUM: 131 mmol/L — AB (ref 135–145)
Total Bilirubin: 1.7 mg/dL — ABNORMAL HIGH (ref 0.3–1.2)
Total Protein: 4.6 g/dL — ABNORMAL LOW (ref 6.5–8.1)

## 2016-06-12 LAB — CYTOLOGY - NON PAP

## 2016-06-12 LAB — GLUCOSE, CAPILLARY
GLUCOSE-CAPILLARY: 222 mg/dL — AB (ref 65–99)
GLUCOSE-CAPILLARY: 253 mg/dL — AB (ref 65–99)
Glucose-Capillary: 279 mg/dL — ABNORMAL HIGH (ref 65–99)
Glucose-Capillary: 259 mg/dL — ABNORMAL HIGH (ref 65–99)

## 2016-06-12 LAB — CULTURE, BODY FLUID-BOTTLE

## 2016-06-12 LAB — CBC
HCT: 28.4 % — ABNORMAL LOW (ref 40.0–52.0)
HEMOGLOBIN: 9.7 g/dL — AB (ref 13.0–18.0)
MCH: 28.7 pg (ref 26.0–34.0)
MCHC: 34.3 g/dL (ref 32.0–36.0)
MCV: 83.8 fL (ref 80.0–100.0)
PLATELETS: 30 10*3/uL — AB (ref 150–440)
RBC: 3.39 MIL/uL — AB (ref 4.40–5.90)
RDW: 16.9 % — ABNORMAL HIGH (ref 11.5–14.5)
WBC: 6.4 10*3/uL (ref 3.8–10.6)

## 2016-06-12 LAB — MRSA PCR SCREENING: MRSA by PCR: NEGATIVE

## 2016-06-12 LAB — PATHOLOGIST SMEAR REVIEW

## 2016-06-12 LAB — CULTURE, BODY FLUID W GRAM STAIN -BOTTLE: Culture: NO GROWTH

## 2016-06-12 MED ORDER — FAMOTIDINE 20 MG PO TABS
20.0000 mg | ORAL_TABLET | Freq: Two times a day (BID) | ORAL | Status: DC
  Administered 2016-06-12 – 2016-06-15 (×6): 20 mg via ORAL
  Filled 2016-06-12 (×6): qty 1

## 2016-06-12 MED ORDER — AZITHROMYCIN 250 MG PO TABS
500.0000 mg | ORAL_TABLET | Freq: Every day | ORAL | Status: AC
  Administered 2016-06-12: 10:00:00 500 mg via ORAL
  Filled 2016-06-12: qty 2

## 2016-06-12 MED ORDER — VANCOMYCIN HCL IN DEXTROSE 1-5 GM/200ML-% IV SOLN
1000.0000 mg | Freq: Once | INTRAVENOUS | Status: AC
  Administered 2016-06-12: 1000 mg via INTRAVENOUS
  Filled 2016-06-12: qty 200

## 2016-06-12 MED ORDER — GADOBENATE DIMEGLUMINE 529 MG/ML IV SOLN
20.0000 mL | Freq: Once | INTRAVENOUS | Status: AC | PRN
  Administered 2016-06-12: 15:00:00 18 mL via INTRAVENOUS

## 2016-06-12 MED ORDER — AZITHROMYCIN 250 MG PO TABS
250.0000 mg | ORAL_TABLET | Freq: Every day | ORAL | Status: DC
  Administered 2016-06-13 – 2016-06-15 (×3): 250 mg via ORAL
  Filled 2016-06-12 (×3): qty 1

## 2016-06-12 MED ORDER — VANCOMYCIN HCL 10 G IV SOLR
1250.0000 mg | Freq: Two times a day (BID) | INTRAVENOUS | Status: DC
  Administered 2016-06-12: 17:00:00 1250 mg via INTRAVENOUS
  Filled 2016-06-12 (×2): qty 1250

## 2016-06-12 NOTE — Care Management Important Message (Signed)
Important Message  Patient Details  Name: [name] [name] MRN: [id number] Date of Birth: [birth year]   Medicare Important Message Given:  Yes    [name] A, RN 06/12/2016, 7:17 AM

## 2016-06-12 NOTE — NC FL2 (Signed)
[place] LEVEL OF CARE SCREENING TOOL     IDENTIFICATION  Patient Name: [name] [name] Birthdate: 02/25/1941 Sex: male Admission Date (Current Location): 06/11/2016  [place] and [place] Number:  Engineering geologist and Address:  [facility], [address] [place], [place] [address]      Provider Number: [id number]  Attending Physician Name and Address:  Loletha Grayer, MD  Relative Name and Phone Number:       Current Level of Care: Hospital Recommended Level of Care: [place] Prior Approval Number:    Date Approved/Denied:   PASRR Number: [id number]  Discharge Plan: SNF    Current Diagnoses: Patient Active Problem List   Diagnosis Date Noted  . Pleural effusion, bilateral 06/11/2016  . Symptomatic anemia 06/11/2016  . Hypoxia 06/06/2016  . UTI (lower urinary tract infection) 05/07/2016  . Weakness 05/07/2016  . Melanoma ([place]) 05/07/2016  . Diabetes mellitus ([place]) 05/07/2016  . Metastatic melanoma ([place]) 04/25/2016  . Prostate cancer ([place]) 01/12/2016  . Venous insufficiency of leg 09/23/2015  . CAD (coronary artery disease) 06/30/2015  . Essential hypertension 06/30/2015  . Essential tremor 06/30/2015  . Preventative health care 06/30/2015  . Carotid artery disease ([place]) 10/02/2014  . Hyperlipidemia   . Type 2 diabetes mellitus with complications ([facility])     Orientation RESPIRATION BLADDER Height & Weight     Self, Time, Situation, Place  O2 (2L) Continent Weight: 194 lb 4.8 oz (88.134 kg) Height:  5\' 11"  (180.3 cm)  BEHAVIORAL SYMPTOMS/MOOD NEUROLOGICAL BOWEL NUTRITION STATUS      Continent Diet (Heart Health/Carb Modified)  AMBULATORY STATUS COMMUNICATION OF NEEDS Skin   Limited Assist Verbally Normal                       Personal Care Assistance Level of Assistance  Bathing, Feeding, Dressing Bathing Assistance: Limited assistance Feeding assistance: Independent Dressing Assistance:  Limited assistance     Functional Limitations Info  Sight, Hearing, Speech Sight Info: Adequate Hearing Info: Adequate Speech Info: Adequate    SPECIAL CARE FACTORS FREQUENCY                       Contractures      Additional Factors Info  Code Status, Allergies Code Status Info: Full Code Allergies Info: Ciprofloxcin, Codeine           Current Medications (06/12/2016):  This is the current hospital active medication list Current Facility-Administered Medications  Medication Dose Route Frequency Provider Last Rate Last Dose  . 0.9 %  sodium chloride infusion   Intravenous Continuous Loletha Grayer, MD 50 mL/hr at 06/12/16 1003    . acetaminophen (TYLENOL) tablet 650 mg  650 mg Oral Q6H PRN Idelle Crouch, MD       Or  . acetaminophen (TYLENOL) suppository 650 mg  650 mg Rectal Q6H PRN Idelle Crouch, MD      . atorvastatin (LIPITOR) tablet 10 mg  10 mg Oral Daily Idelle Crouch, MD   10 mg at 06/12/16 1005  . [START ON 06/13/2016] azithromycin (ZITHROMAX) tablet 250 mg  250 mg Oral Daily Loletha Grayer, MD      . bisacodyl (DULCOLAX) suppository 10 mg  10 mg Rectal Daily PRN Idelle Crouch, MD      . cefTRIAXone (ROCEPHIN) 1 g in dextrose 5 % 50 mL IVPB  1 g Intravenous Daily Idelle Crouch, MD 100 mL/hr at 06/12/16  1056 1 g at 06/12/16 1056  . cholecalciferol (VITAMIN D) tablet 1,000 Units  1,000 Units Oral Daily Idelle Crouch, MD   1,000 Units at 06/12/16 1005  . docusate sodium (COLACE) capsule 100 mg  100 mg Oral BID Idelle Crouch, MD   100 mg at 06/12/16 1005  . DULoxetine (CYMBALTA) DR capsule 30 mg  30 mg Oral Daily Idelle Crouch, MD   30 mg at 06/12/16 1005  . famotidine (PEPCID) tablet 20 mg  20 mg Oral BID Loletha Grayer, MD   20 mg at 06/12/16 1217  . insulin aspart (novoLOG) injection 0-9 Units  0-9 Units Subcutaneous TID WC Idelle Crouch, MD   5 Units at 06/12/16 1218  . insulin aspart protamine- aspart (NOVOLOG MIX 70/30) injection 20  Units  20 Units Subcutaneous BID WC Idelle Crouch, MD   20 Units at 06/12/16 [phone]  . ondansetron (ZOFRAN) tablet 4 mg  4 mg Oral Q6H PRN Idelle Crouch, MD       Or  . ondansetron [facility]) injection 4 mg  4 mg Intravenous Q6H PRN Idelle Crouch, MD   4 mg at 06/12/16 0442  . primidone (MYSOLINE) tablet 50 mg  50 mg Oral Daily Idelle Crouch, MD   50 mg at 06/12/16 1005  . sodium chloride flush (NS) 0.9 % injection 3 mL  3 mL Intravenous Q12H Idelle Crouch, MD   3 mL at 06/12/16 1008  . tamsulosin (FLOMAX) capsule 0.4 mg  0.4 mg Oral QPC breakfast Idelle Crouch, MD   0.4 mg at 06/12/16 0836  . traMADol (ULTRAM) tablet 50 mg  50 mg Oral Q8H PRN Idelle Crouch, MD   50 mg at 06/11/16 2341  . vancomycin (VANCOCIN) 1,250 mg in sodium chloride 0.9 % 250 mL IVPB  1,250 mg Intravenous Q12H Loletha Grayer, MD      . vitamin B-12 (CYANOCOBALAMIN) tablet 1,000 mcg  1,000 mcg Oral Daily Idelle Crouch, MD   1,000 mcg at 06/12/16 1005     Discharge Medications: Please see discharge summary for a list of discharge medications.  Relevant Imaging Results:  Relevant Lab Results:   Additional Information SSN:  [id number]  Darden Dates, LCSW

## 2016-06-12 NOTE — Clinical Social Work Note (Signed)
CSW met with pt's family to address discharge plan. PT is recommending SNF at discharge. Pt's family is agreeable to SNF search. CSW will follow up with bed offers. CSW will continue to follow.   Darden Dates, MSW, LCSW  Clinical Social Worker  [phone]

## 2016-06-12 NOTE — Progress Notes (Signed)
Notified MD of patient's critical platelets of 30,000 this AM.  No new orders give, will continue to monitor.

## 2016-06-12 NOTE — Progress Notes (Signed)
Patient ID: AWAIS MANZA, male   DOB: 04/10/1941, 75 y.o.   MRN: [id number] [facility] PROGRESS NOTE  DAMONTRE HINERMAN [id number] DOB: Feb 24, 1941 DOA: 06/11/2016 PCP: Coral Spikes, DO  HPI/Subjective: Patient having some confusion. Feeling very weak. Some shortness of breath.  Objective: Filed Vitals:   06/12/16 0437 06/12/16 1243  BP: 132/68 126/64  Pulse: 94 89  Temp: 97.6 F (36.4 C) 98.3 F (36.8 C)  Resp: 16 20    Filed Weights   06/11/16 1029 06/12/16 0437  Weight: 88.451 kg (195 lb) 88.134 kg (194 lb 4.8 oz)    ROS: Review of Systems  Constitutional: Negative for fever and chills.  Eyes: Negative for blurred vision.  Respiratory: Positive for cough and shortness of breath.   Cardiovascular: Negative for chest pain.  Gastrointestinal: Negative for nausea, vomiting, abdominal pain, diarrhea and constipation.  Genitourinary: Negative for dysuria.  Musculoskeletal: Negative for joint pain.  Neurological: Negative for dizziness and headaches.   Exam: Physical Exam  HENT:  Nose: No mucosal edema.  Mouth/Throat: No oropharyngeal exudate or posterior oropharyngeal edema.  Eyes: Conjunctivae, EOM and lids are normal. Pupils are equal, round, and reactive to light.  Neck: No JVD present. Carotid bruit is not present. No edema present. No thyroid mass and no thyromegaly present.  Cardiovascular: S1 normal and S2 normal.  Exam reveals no gallop.   No murmur heard. Pulses:      Dorsalis pedis pulses are 2+ on the right side, and 2+ on the left side.  Respiratory: No respiratory distress. He has no wheezes. He has rhonchi in the right lower field. He has no rales.  GI: Soft. Bowel sounds are normal. There is no tenderness.  Musculoskeletal:       Right ankle: He exhibits swelling.       Left ankle: He exhibits swelling.  Lymphadenopathy:    He has no cervical adenopathy.  Neurological: He is alert. No cranial nerve deficit.  Skin: Skin is warm. No rash noted. Nails  show no clubbing.  Psychiatric: He has a normal mood and affect.      Data Reviewed: Basic Metabolic Panel:  Recent Labs Lab 06/06/16 1524 06/07/16 0416 06/08/16 0433 06/11/16 1036 06/12/16 0409  NA 131* 133* 133* 130* 131*  K 3.8 4.0 4.0 4.1 4.1  CL 95* 99* 100* 96* 98*  CO2 24 26 26 26 26   GLUCOSE 156* 125* 122* 217* 203*  BUN 29* 30* 30* 38* 36*  CREATININE 0.93 0.89 0.93 0.84 0.71  CALCIUM 7.8* 7.6* 7.8* 7.6* 7.5*   Liver Function Tests:  Recent Labs Lab 06/06/16 1524 06/11/16 1036 06/12/16 0409  AST 51* 43* 46*  ALT 15* 16* 16*  ALKPHOS 137* 233* 248*  BILITOT 1.1 1.1 1.7*  PROT 4.8* 4.8* 4.6*  ALBUMIN 2.1* 1.9* 1.9*   CBC:  Recent Labs Lab 06/06/16 1524 06/07/16 0416 06/08/16 0433 06/11/16 1036 06/12/16 0409  WBC 5.3 3.9 4.1 7.1 6.4  NEUTROABS  --   --   --  5.8  --   HGB 9.7* 8.2* 8.6* 7.2* 9.7*  HCT 28.7* 24.8* 26.2* 21.7* 28.4*  MCV 84.9 85.4 84.3 84.3 83.8  PLT 64* 47* 48* 44* 30*   Cardiac Enzymes:  Recent Labs Lab 06/06/16 1524 06/11/16 1036  TROPONINI <0.03 <0.03   BNP (last 3 results)  Recent Labs  06/06/16 1524  BNP 73.0    ProBNP (last 3 results)  Recent Labs  01/11/16 1217  PROBNP 137.0*  CBG:  Recent Labs Lab 06/08/16 1124 06/11/16 1743 06/11/16 2112 06/12/16 0735 06/12/16 1146  GLUCAP 151* 199* 217* 222* 253*       Studies: Dg Chest 2 View  06/12/2016  CLINICAL DATA:  Follow-up bilateral pleural effusions. EXAM: CHEST  2 VIEW COMPARISON:  06/11/2016. FINDINGS: Prior CABG. Bibasilar subsegmental atelectasis. Mild left lower lobe infiltrate. Small left pleural effusion again noted. Tiny right pleural effusion cannot be excluded . No pneumothorax. IMPRESSION: 1. Prior CABG. 2. Bibasilar subsegmental atelectasis. Mild left lower lobe infiltrate and small left pleural effusion. Tiny right effusion cannot be entirely excluded . No significant change from prior exam . Electronically Signed   By: Marcello Moores   Register   On: 06/12/2016 08:11   Dg Chest 2 View  06/11/2016  CLINICAL DATA:  Shortness of Breath EXAM: CHEST  2 VIEW COMPARISON:  06/07/2016 FINDINGS: Cardiac shadow is within normal limits. Postoperative changes are noted. Increasing left-sided pleural effusion is noted with likely underlying atelectasis. A smaller right pleural effusion is noted as well. IMPRESSION: New effusions bilaterally left greater than right with associated left basilar atelectasis. Electronically Signed   By: Inez Catalina M.D.   On: 06/11/2016 12:14    Scheduled Meds: . atorvastatin  10 mg Oral Daily  . [START ON 06/13/2016] azithromycin  250 mg Oral Daily  . cefTRIAXone (ROCEPHIN)  IV  1 g Intravenous Daily  . cholecalciferol  1,000 Units Oral Daily  . docusate sodium  100 mg Oral BID  . DULoxetine  30 mg Oral Daily  . famotidine  20 mg Oral BID  . insulin aspart  0-9 Units Subcutaneous TID WC  . insulin aspart protamine- aspart  20 Units Subcutaneous BID WC  . primidone  50 mg Oral Daily  . sodium chloride flush  3 mL Intravenous Q12H  . tamsulosin  0.4 mg Oral QPC breakfast  . vancomycin  1,250 mg Intravenous Q12H  . vitamin B-12  1,000 mcg Oral Daily   Continuous Infusions: . sodium chloride 50 mL/hr at 06/12/16 1003    Assessment/Plan:  1. Pneumonia with confusion. Since patient recently in the hospital I'll give triple antibiotics with Rocephin and vancomycin and Zithromax. Repeat chest x-ray only shows a small left pleural effusion  I don't think there is enough there to do with thoracentesis. 2. Metastatic melanoma. Received 2 outpatient immunotherapy treatments.MRI of the brain with his confusion to rule out metastatic disease. Spoke with pathologist the thoracentesis fluid was negative for cancer. Spoke with oncology here and they feel that his disease is progressing. He tried to speak with the family about palliative care but not interested right now. 3. Symptomatic anemia. Patient responded to 2  units of packed red blood cells 4. 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 5. Weakness. Physical therapy recommended a rehabilitation 6. Type 2 diabetes. Continue his usual insulin regimen. 7. BPH on Flomax 8. Hyperlipidemia unspecified old atorvastatin.  Code Status:     Code Status Orders        Start     Ordered   06/11/16 1733  Full code   Continuous     06/11/16 1732    Code Status History    Date Active Date Inactive Code Status Order ID Comments User Context   06/06/2016  6:11 PM 06/08/2016  5:51 PM Full Code [id number]  Hillary Bow, MD ED   05/08/2016  1:03 AM 05/10/2016 10:53 AM Full Code [id number]  Quintella Baton, MD Inpatient    Advance  Directive Documentation        Most Recent Value   Type of Advance Directive  Healthcare Power of Attorney   Pre-existing out of facility DNR order (yellow form or pink MOST form)     "MOST" Form in Place?       Family Communication: unable to reach wife earlier. Spoke with daughter on the phone. Disposition Plan: to be determined  Consultants:  oncology  Antibiotics:  Vancomycin  Rocephin  Zithromax  Time spent: 25 minutes  [place], [place]

## 2016-06-12 NOTE — Consult Note (Signed)
Surgical Consultation  06/12/2016  [name] [name] is an 75 y.o. male.   CC: Recurrent pleural effusion  HPI: This a patient with multiple myeloma who had a thoracentesis on Friday and came to the emergency room short of breath and workup showed a small if not tiny pleural effusion recurrence. He is feeling better now and is not short of breath and is tolerating a regular diet  Past Medical History  Diagnosis Date  . Insulin dependent diabetes mellitus ([place])   . Arteriosclerotic coronary artery disease   . Peyronie's disease   . Prostatitis   . Benign essential tremor   . Hyperlipidemia   . Bladder diverticulum   . Melanoma ([place])   . Cancer ([place])     Stage 4 melanoma mets to bladder, kidneys, lymph nodes and possibly bone marrow per pt's spouse.    Past Surgical History  Procedure Laterality Date  . Cholecystectomy  1973  . 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 Arthritis Mother   . Heart disease Mother   . Heart attack Mother   . Diabetes Sister   . Heart disease Brother   . Diabetes Brother     Social History:  reports that he has never smoked. He has never used smokeless tobacco. He reports that he drinks alcohol. He reports that he does not use illicit drugs.  Allergies:  Allergies  Allergen Reactions  . Ciprofloxacin Nausea And Vomiting and Other (See Comments)    Reaction:  Shaking   . Codeine Other (See Comments)    Reaction:  Cramping     Medications reviewed.   Review of Systems:   Review of Systems  Constitutional: Positive for weight loss and malaise/fatigue. Negative for fever and chills.  Respiratory: Positive for shortness of breath. Negative for cough, hemoptysis and wheezing.   Cardiovascular: Negative for chest pain and palpitations.  Skin: Negative.      Physical Exam:  BP 126/64 mmHg  Pulse 89  Temp(Src) 98.3 F (36.8 C) (Oral)  Resp 20  Ht '5\' 11"'$  (1.803 m)   Wt 194 lb 4.8 oz (88.134 kg)  BMI 27.11 kg/m2  SpO2 99%  Physical Exam  Constitutional: No distress.  Cachectic male  Cardiovascular: Normal rate, regular rhythm and normal heart sounds.   Pulmonary/Chest: Effort normal and breath sounds normal. No respiratory distress.  Abdominal: Soft. He exhibits no distension.  Skin: He is not diaphoretic.  Vitals reviewed.     Results for orders placed or performed during the hospital encounter of 06/11/16 (from the past 48 hour(s))  CBC with Differential     Status: Abnormal   Collection Time: 06/11/16 10:36 AM  Result Value Ref Range   WBC 7.1 3.8 - 10.6 K/uL   RBC 2.57 (L) 4.40 - 5.90 MIL/uL   Hemoglobin 7.2 (L) 13.0 - 18.0 g/dL   HCT 21.7 (L) 40.0 - 52.0 %   MCV 84.3 80.0 - 100.0 fL   MCH 28.0 26.0 - 34.0 pg   MCHC 33.2 32.0 - 36.0 g/dL   RDW 18.4 (H) 11.5 - 14.5 %   Platelets 44 (L) 150 - 440 K/uL   Neutrophils Relative % 59 %   Lymphocytes Relative 11 %   Monocytes Relative 7 %   Eosinophils Relative 0 %   Basophils Relative 0 %   Band Neutrophils 8 %   Metamyelocytes Relative 12 %  Myelocytes 2 %   Promyelocytes Absolute 0 %   Blasts 0 %   nRBC 8 (H) 0 /100 WBC   Other 1 %   Neutro Abs 5.8 1.4 - 6.5 K/uL   Lymphs Abs 0.8 (L) 1.0 - 3.6 K/uL   Monocytes Absolute 0.5 0.2 - 1.0 K/uL   Eosinophils Absolute 0.0 0 - 0.7 K/uL   Basophils Absolute 0.0 0 - 0.1 K/uL   RBC Morphology POLYCHROMASIA PRESENT     Comment: MIXED RBC POPULATION   Smear Review PENDING PATHOLOGIST REVIEW   Comprehensive metabolic panel     Status: Abnormal   Collection Time: 06/11/16 10:36 AM  Result Value Ref Range   Sodium 130 (L) 135 - 145 mmol/L   Potassium 4.1 3.5 - 5.1 mmol/L   Chloride 96 (L) 101 - 111 mmol/L   CO2 26 22 - 32 mmol/L   Glucose, Bld 217 (H) 65 - 99 mg/dL   BUN 38 (H) 6 - 20 mg/dL   Creatinine, Ser 0.84 0.61 - 1.24 mg/dL   Calcium 7.6 (L) 8.9 - 10.3 mg/dL   Total Protein 4.8 (L) 6.5 - 8.1 g/dL   Albumin 1.9 (L) 3.5 - 5.0 g/dL    AST 43 (H) 15 - 41 U/L   ALT 16 (L) 17 - 63 U/L   Alkaline Phosphatase 233 (H) 38 - 126 U/L   Total Bilirubin 1.1 0.3 - 1.2 mg/dL   GFR calc non Af Amer >60 >60 mL/min   GFR calc Af Amer >60 >60 mL/min    Comment: (NOTE) The eGFR has been calculated using the CKD EPI equation. This calculation has not been validated in all clinical situations. eGFR's persistently <60 mL/min signify possible Chronic Kidney Disease.    Anion gap 8 5 - 15  Troponin I     Status: None   Collection Time: 06/11/16 10:36 AM  Result Value Ref Range   Troponin I <0.03 <0.03 ng/mL  Pathologist smear review     Status: None   Collection Time: 06/11/16 10:36 AM  Result Value Ref Range   Path Review Peripheral smear reveals:     Comment: Normocytic anemia with nucleated RBCs. Thrombocytopenia with hypogranular platelets. Patient with metastatic melanoma, currently under treatment. Above findings may be secondary to bone marrow involvement by malignancy. Reviewed by Dellia Nims Reuel Derby, M.D.   Type and screen [place]     Status: None (Preliminary result)   Collection Time: 06/11/16  2:34 PM  Result Value Ref Range   ABO/RH(D) O POS    Antibody Screen NEG    Sample Expiration 06/14/2016    Unit Number [id number]    Blood Component Type RED CELLS,LR    Unit division 00    Status of Unit ISSUED    Transfusion Status OK TO TRANSFUSE    Crossmatch Result Compatible    Unit Number [id number]    Blood Component Type RED CELLS,LR    Unit division 00    Status of Unit ISSUED    Transfusion Status OK TO TRANSFUSE    Crossmatch Result Compatible   Prepare RBC     Status: None   Collection Time: 06/11/16  2:34 PM  Result Value Ref Range   Order Confirmation ORDER PROCESSED BY BLOOD BANK   Glucose, capillary     Status: Abnormal   Collection Time: 06/11/16  5:43 PM  Result Value Ref Range   Glucose-Capillary 199 (H) 65 - 99 mg/dL  Urinalysis complete, with microscopic (  [place]  only)     Status: Abnormal   Collection Time: 06/11/16  8:45 PM  Result Value Ref Range   Color, Urine AMBER (A) YELLOW   APPearance HAZY (A) CLEAR   Glucose, UA 50 (A) NEGATIVE mg/dL   Bilirubin Urine NEGATIVE NEGATIVE   Ketones, ur NEGATIVE NEGATIVE mg/dL   Specific Gravity, Urine 1.017 1.005 - 1.030   Hgb urine dipstick 3+ (A) NEGATIVE   pH 5.0 5.0 - 8.0   Protein, ur NEGATIVE NEGATIVE mg/dL   Nitrite NEGATIVE NEGATIVE   Leukocytes, UA TRACE (A) NEGATIVE   RBC / HPF TOO NUMEROUS TO COUNT 0 - 5 RBC/hpf   WBC, UA TOO NUMEROUS TO COUNT 0 - 5 WBC/hpf   Bacteria, UA NONE SEEN NONE SEEN   Squamous Epithelial / LPF 0-5 (A) NONE SEEN   Mucous PRESENT   Glucose, capillary     Status: Abnormal   Collection Time: 06/11/16  9:12 PM  Result Value Ref Range   Glucose-Capillary 217 (H) 65 - 99 mg/dL  CBC     Status: Abnormal   Collection Time: 06/12/16  4:09 AM  Result Value Ref Range   WBC 6.4 3.8 - 10.6 K/uL   RBC 3.39 (L) 4.40 - 5.90 MIL/uL   Hemoglobin 9.7 (L) 13.0 - 18.0 g/dL    Comment: RESULT REPEATED AND VERIFIED   HCT 28.4 (L) 40.0 - 52.0 %   MCV 83.8 80.0 - 100.0 fL   MCH 28.7 26.0 - 34.0 pg   MCHC 34.3 32.0 - 36.0 g/dL   RDW 16.9 (H) 11.5 - 14.5 %   Platelets 30 (L) 150 - 440 K/uL    Comment: CRITICAL RESULT CALLED TO, READ BACK BY AND VERIFIED WITH: [facility] KLENNER AT 5956 ON 06/12/16 RWW   Comprehensive metabolic panel     Status: Abnormal   Collection Time: 06/12/16  4:09 AM  Result Value Ref Range   Sodium 131 (L) 135 - 145 mmol/L   Potassium 4.1 3.5 - 5.1 mmol/L   Chloride 98 (L) 101 - 111 mmol/L   CO2 26 22 - 32 mmol/L   Glucose, Bld 203 (H) 65 - 99 mg/dL   BUN 36 (H) 6 - 20 mg/dL   Creatinine, Ser 0.71 0.61 - 1.24 mg/dL   Calcium 7.5 (L) 8.9 - 10.3 mg/dL   Total Protein 4.6 (L) 6.5 - 8.1 g/dL   Albumin 1.9 (L) 3.5 - 5.0 g/dL   AST 46 (H) 15 - 41 U/L   ALT 16 (L) 17 - 63 U/L   Alkaline Phosphatase 248 (H) 38 - 126 U/L   Total Bilirubin 1.7 (H) 0.3 - 1.2 mg/dL    GFR calc non Af Amer >60 >60 mL/min   GFR calc Af Amer >60 >60 mL/min    Comment: (NOTE) The eGFR has been calculated using the CKD EPI equation. This calculation has not been validated in all clinical situations. eGFR's persistently <60 mL/min signify possible Chronic Kidney Disease.    Anion gap 7 5 - 15  Glucose, capillary     Status: Abnormal   Collection Time: 06/12/16  7:35 AM  Result Value Ref Range   Glucose-Capillary 222 (H) 65 - 99 mg/dL  Glucose, capillary     Status: Abnormal   Collection Time: 06/12/16 11:46 AM  Result Value Ref Range   Glucose-Capillary 253 (H) 65 - 99 mg/dL   Comment 1 Notify RN    Comment 2 Document in Chart   MRSA PCR Screening  Status: None   Collection Time: 06/12/16  2:30 PM  Result Value Ref Range   MRSA by PCR NEGATIVE NEGATIVE    Comment:        The GeneXpert MRSA Assay (FDA approved for NASAL specimens only), is one component of a comprehensive MRSA colonization surveillance program. It is not intended to diagnose MRSA infection nor to guide or monitor treatment for MRSA infections.   Glucose, capillary     Status: Abnormal   Collection Time: 06/12/16  4:34 PM  Result Value Ref Range   Glucose-Capillary 279 (H) 65 - 99 mg/dL   Comment 1 Notify RN    Comment 2 Document in Chart    Dg Chest 2 View  06/12/2016  CLINICAL DATA:  Follow-up bilateral pleural effusions. EXAM: CHEST  2 VIEW COMPARISON:  06/11/2016. FINDINGS: Prior CABG. Bibasilar subsegmental atelectasis. Mild left lower lobe infiltrate. Small left pleural effusion again noted. Tiny right pleural effusion cannot be excluded . No pneumothorax. IMPRESSION: 1. Prior CABG. 2. Bibasilar subsegmental atelectasis. Mild left lower lobe infiltrate and small left pleural effusion. Tiny right effusion cannot be entirely excluded . No significant change from prior exam . Electronically Signed   By: Marcello Moores  Register   On: 06/12/2016 08:11   Dg Chest 2 View  06/11/2016  CLINICAL  DATA:  Shortness of Breath EXAM: CHEST  2 VIEW COMPARISON:  06/07/2016 FINDINGS: Cardiac shadow is within normal limits. Postoperative changes are noted. Increasing left-sided pleural effusion is noted with likely underlying atelectasis. A smaller right pleural effusion is noted as well. IMPRESSION: New effusions bilaterally left greater than right with associated left basilar atelectasis. Electronically Signed   By: Inez Catalina M.D.   On: 06/11/2016 12:14   Mr Jeri Cos BH Contrast  06/12/2016  CLINICAL DATA:  Confusion.  History of melanoma. EXAM: MRI HEAD WITHOUT AND WITH CONTRAST TECHNIQUE: Multiplanar, multiecho pulse sequences of the brain and surrounding structures were obtained without and with intravenous contrast. CONTRAST:  27m MULTIHANCE GADOBENATE DIMEGLUMINE 529 MG/ML IV SOLN COMPARISON:  Head CT 05/06/2016 FINDINGS: Calvarium and upper cervical spine: Heterogeneous enhancement throughout the central skullbase and upper cervical spine consistent with metastatic disease. Orbits: Negative. Sinuses and Mastoids: Completely opacified left maxillary antrum with mild atelectasis, consistent with chronic obstruction. No nasal cavity mass is seen. Brain: No evidence of brain metastasis. No acute infarct, hemorrhage, hydrocephalus, or mass lesion. No evidence of large vessel occlusion. Moderate generalized atrophy. IMPRESSION: 1. No acute finding or brain metastasis. 2. Known osseous metastatic disease. 3. Moderate atrophy. 4. Chronically obstructed left maxillary sinus. Electronically Signed   By: JMonte FantasiaM.D.   On: 06/12/2016 15:37    Assessment/Plan:  Thrombocytopenic patient with a very small pleural effusion recurrence may not even be a true recurrence and that it could only be the amount is left over from prior thoracentesis on Friday. I see no need for thoracentesis at this point especially with his platelet count of 30,000. Reconsult as needed  RFlorene Glen MD, FACS

## 2016-06-12 NOTE — Consult Note (Signed)
[place] CONSULT NOTE  Patient Care Team: Coral Spikes, DO as PCP - General (Family Medicine)  CHIEF COMPLAINTS/PURPOSE OF CONSULTATION:  Metastatic melanoma  HISTORY OF PRESENTING ILLNESS:  [name] [name] 75 y.o.  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 Patient is currently being treated At [facility] with combination immunotherapy with ipi + Nivo; Status post #2 cycle on June 21st.  Patient was discharged from the hospital just last week- when he was admitted for worsening left-sided pleural effusion needing a thoracentesis.   However patient noted to have worsening shortness of breath the last few days. No fever no chills. No nausea no vomiting. Poor appetite. He feels weak. His back is improved.  At this admission in The hospital patient face noted to have a hemoglobin of 7 platelets of 30; is currently status post 2 units of PRBC transfusion. Chest x-ray showed small bilateral effusions/atelectasis versus underlying infiltrate. Is currently on antibiotics. No significant diarrhea.  ROS: A complete 10 point review of system is done which is negative except mentioned above in history of present illness  MEDICAL HISTORY:  Past Medical History  Diagnosis Date  . Insulin dependent diabetes mellitus ([place])   . Arteriosclerotic coronary artery disease   . Peyronie's disease   . Prostatitis   . Benign essential tremor   . Hyperlipidemia   . Bladder diverticulum   . Melanoma ([place])   . Cancer ([place])     Stage 4 melanoma mets to bladder, kidneys, lymph nodes and possibly bone marrow per pt's spouse.    SURGICAL HISTORY: Past Surgical History  Procedure Laterality Date  . Cholecystectomy  1973  . 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 Marital Status: Married    Spouse Name: N/A  .  Number of Children: N/A  . Years of Education: 16   Occupational History  . Merchant    Social History Main Topics  . Smoking status: Never Smoker   . Smokeless tobacco: Never Used  . Alcohol Use: Yes     Comment: 4OZ of wine qxnight.  . Drug Use: No  . Sexual Activity: Not on file   Other Topics Concern  . Not on file   Social History Narrative   Regular exercise-yes    FAMILY HISTORY: Family History  Problem Relation Age of Onset  . Arthritis Mother   . Heart disease Mother   . Heart attack Mother   . Diabetes Sister   . Heart disease Brother   . Diabetes Brother     ALLERGIES:  is allergic to ciprofloxacin and codeine.  MEDICATIONS:  Current Facility-Administered Medications  Medication Dose Route Frequency Provider Last Rate Last Dose  . 0.9 %  sodium chloride infusion   Intravenous Continuous Loletha Grayer, MD 50 mL/hr at 06/12/16 1556    . acetaminophen (TYLENOL) tablet 650 mg  650 mg Oral Q6H PRN Idelle Crouch, MD       Or  . acetaminophen (TYLENOL) suppository 650 mg  650 mg Rectal Q6H PRN Idelle Crouch, MD      . atorvastatin (LIPITOR) tablet 10 mg  10 mg Oral Daily Idelle Crouch, MD   10 mg at 06/12/16 1005  . [START ON 06/13/2016] azithromycin (ZITHROMAX) tablet 250 mg  250 mg Oral Daily Richard  Wieting, MD      . bisacodyl (DULCOLAX) suppository 10 mg  10 mg Rectal Daily PRN Idelle Crouch, MD      . cefTRIAXone (ROCEPHIN) 1 g in dextrose 5 % 50 mL IVPB  1 g Intravenous Daily Idelle Crouch, MD 100 mL/hr at 06/12/16 1056 1 g at 06/12/16 1056  . cholecalciferol (VITAMIN D) tablet 1,000 Units  1,000 Units Oral Daily Idelle Crouch, MD   1,000 Units at 06/12/16 1005  . docusate sodium (COLACE) capsule 100 mg  100 mg Oral BID Idelle Crouch, MD   100 mg at 06/12/16 1005  . DULoxetine (CYMBALTA) DR capsule 30 mg  30 mg Oral Daily Idelle Crouch, MD   30 mg at 06/12/16 1005  . famotidine (PEPCID) tablet 20 mg  20 mg Oral BID Loletha Grayer, MD    20 mg at 06/12/16 1217  . insulin aspart (novoLOG) injection 0-9 Units  0-9 Units Subcutaneous TID WC Idelle Crouch, MD   5 Units at 06/12/16 1703  . insulin aspart protamine- aspart (NOVOLOG MIX 70/30) injection 20 Units  20 Units Subcutaneous BID WC Idelle Crouch, MD   20 Units at 06/12/16 1704  . ondansetron (ZOFRAN) tablet 4 mg  4 mg Oral Q6H PRN Idelle Crouch, MD       Or  . ondansetron [facility]) injection 4 mg  4 mg Intravenous Q6H PRN Idelle Crouch, MD   4 mg at 06/12/16 0442  . primidone (MYSOLINE) tablet 50 mg  50 mg Oral Daily Idelle Crouch, MD   50 mg at 06/12/16 1005  . sodium chloride flush (NS) 0.9 % injection 3 mL  3 mL Intravenous Q12H Idelle Crouch, MD   3 mL at 06/12/16 1008  . tamsulosin (FLOMAX) capsule 0.4 mg  0.4 mg Oral QPC breakfast Idelle Crouch, MD   0.4 mg at 06/12/16 0836  . traMADol (ULTRAM) tablet 50 mg  50 mg Oral Q8H PRN Idelle Crouch, MD   50 mg at 06/11/16 2341  . vancomycin (VANCOCIN) 1,250 mg in sodium chloride 0.9 % 250 mL IVPB  1,250 mg Intravenous Q12H Loletha Grayer, MD   1,250 mg at 06/12/16 1704  . vitamin B-12 (CYANOCOBALAMIN) tablet 1,000 mcg  1,000 mcg Oral Daily Idelle Crouch, MD   1,000 mcg at 06/12/16 1005      .  PHYSICAL EXAMINATION:   Filed Vitals:   06/12/16 1243 06/12/16 1834  BP: 126/64 107/67  Pulse: 89 108  Temp: 98.3 F (36.8 C)   Resp: 20 22   Filed Weights   06/11/16 1029 06/12/16 0437  Weight: 195 lb (88.451 kg) 194 lb 4.8 oz (88.134 kg)    GENERAL: Moderately nourished moderately built; Alert, mild distress sec to pain. Accompanied by his wife EYES: no pallor or icterus OROPHARYNX: no thrush or ulceration. NECK: supple, no masses felt LYMPH: no palpable lymphadenopathy in the cervical, axillary or inguinal regions LUNGS: decreased breath sounds to auscultation at bases and No wheeze or crackles HEART/CVS: regular rate & rhythm and no murmurs; No lower extremity edema ABDOMEN: abdomen  soft, non-tender and normal bowel sounds Musculoskeletal:no cyanosis of digits and no clubbing  PSYCH: alert & oriented x 3 with fluent speech NEURO: no focal motor/sensory deficits SKIN: Multiple 1 cm sized "moles " noted on the chest [new as per fmaily]  LABORATORY DATA:  I have reviewed the data as listed Lab Results  Component Value Date  WBC 6.4 06/12/2016   HGB 9.7* 06/12/2016   HCT 28.4* 06/12/2016   MCV 83.8 06/12/2016   PLT 30* 06/12/2016    Recent Labs  05/09/16 1019  06/06/16 1524  06/08/16 0433 06/11/16 1036 06/12/16 0409  NA  --   < > 131*  < > 133* 130* 131*  K  --   < > 3.8  < > 4.0 4.1 4.1  CL  --   < > 95*  < > 100* 96* 98*  CO2  --   < > 24  < > 26 26 26   GLUCOSE  --   < > 156*  < > 122* 217* 203*  BUN  --   < > 29*  < > 30* 38* 36*  CREATININE  --   < > 0.93  < > 0.93 0.84 0.71  CALCIUM  --   < > 7.8*  < > 7.8* 7.6* 7.5*  GFRNONAA  --   < > >60  < > >60 >60 >60  GFRAA  --   < > >60  < > >60 >60 >60  PROT 4.8*  --  4.8*  --   --  4.8* 4.6*  ALBUMIN 1.8*  --  2.1*  --   --  1.9* 1.9*  AST 105*  --  51*  --   --  43* 46*  ALT 62  --  15*  --   --  16* 16*  ALKPHOS 191*  --  137*  --   --  233* 248*  BILITOT 0.5  --  1.1  --   --  1.1 1.7*  BILIDIR 0.2  --   --   --   --   --   --   IBILI 0.3  --   --   --   --   --   --   < > = values in this interval not displayed.  RADIOGRAPHIC STUDIES: I have personally reviewed the radiological images as listed and agreed with the findings in the report. Dg Chest 2 View  06/12/2016  CLINICAL DATA:  Follow-up bilateral pleural effusions. EXAM: CHEST  2 VIEW COMPARISON:  06/11/2016. FINDINGS: Prior CABG. Bibasilar subsegmental atelectasis. Mild left lower lobe infiltrate. Small left pleural effusion again noted. Tiny right pleural effusion cannot be excluded . No pneumothorax. IMPRESSION: 1. Prior CABG. 2. Bibasilar subsegmental atelectasis. Mild left lower lobe infiltrate and small left pleural effusion. Tiny right  effusion cannot be entirely excluded . No significant change from prior exam . Electronically Signed   By: Marcello Moores  Register   On: 06/12/2016 08:11   Dg Chest 2 View  06/11/2016  CLINICAL DATA:  Shortness of Breath EXAM: CHEST  2 VIEW COMPARISON:  06/07/2016 FINDINGS: Cardiac shadow is within normal limits. Postoperative changes are noted. Increasing left-sided pleural effusion is noted with likely underlying atelectasis. A smaller right pleural effusion is noted as well. IMPRESSION: New effusions bilaterally left greater than right with associated left basilar atelectasis. Electronically Signed   By: Inez Catalina M.D.   On: 06/11/2016 12:14   Dg Chest 2 View  06/07/2016  CLINICAL DATA:  Left-sided thoracentesis EXAM: CHEST  2 VIEW COMPARISON:  Yesterday FINDINGS: Diminished left pleural effusion status post thoracentesis. No re-expansion edema or pneumothorax. Mild left basilar atelectasis. Mild residual pleural fluid or thickening. Normal heart size and aortic contours.  Status post CABG. Known sclerotic osseous metastases are not well visualized. IMPRESSION: 1. No acute finding after left thoracentesis.  2. Left basilar atelectasis. Electronically Signed   By: Monte Fantasia M.D.   On: 06/07/2016 12:58   Dg Chest 2 View  06/06/2016  CLINICAL DATA:  Weakness, shortness of Breath, metastatic melanoma EXAM: CHEST  2 VIEW COMPARISON:  05/07/2016 FINDINGS: Cardiomediastinal silhouette is stable. Status post median sternotomy. Linear atelectasis or scarring noted right lower lobe perihilar. There is small left pleural effusion left basilar atelectasis or infiltrate. No pulmonary edema. IMPRESSION: Status post CABG. Small left pleural effusion with left basilar atelectasis or infiltrate. Electronically Signed   By: Lahoma Crocker M.D.   On: 06/06/2016 16:40   Dg Lumbar Spine Complete  06/07/2016  CLINICAL DATA:  Low back pain EXAM: LUMBAR SPINE - COMPLETE 4+ VIEW COMPARISON:  04/29/2014 FINDINGS: Scattered large  and small bowel gas is noted. Fecal material is noted throughout the colon. A 12 mm triangular shaped density is noted in the lower pole of the right kidney consistent with a nonobstructing stone. There is fullness of the collecting system and ureter on the left suspicious for a distal ureteral calculus. Significant trabeculation of the bladder is noted. Multilevel degenerative changes are seen with osteophytic change and disc space narrowing. Facet hypertrophic changes are noted. No compression deformities are seen. Diffuse aortic calcifications are noted IMPRESSION: Increase in size in the right lower pole renal calculus. Changes suggestive of at least partial ureteral obstruction on the left. Further evaluation is recommended. This may contribute to the patient's underlying back pain. Degenerative changes of the lumbar spine without acute abnormality. Increased trabeculation of the bladder which may be related to bladder outlet obstruction. Electronically Signed   By: Inez Catalina M.D.   On: 06/07/2016 09:41   Ct Angio Chest Pe W/cm &/or Wo Cm  06/06/2016  CLINICAL DATA:  Coughing and shortness of breath. Evaluate for pulmonary embolus. EXAM: CT ANGIOGRAPHY CHEST WITH CONTRAST TECHNIQUE: Multidetector CT imaging of the chest was performed using the standard protocol during bolus administration of intravenous contrast. Multiplanar CT image reconstructions and MIPs were obtained to evaluate the vascular anatomy. CONTRAST:  100 mL of Isovue 370 COMPARISON:  CT scan of the chest January 11, 2016 and chest x-ray from today FINDINGS: The central airways are normal. No pneumothorax. There is a small right pleural effusion and moderate size left effusion, both with associated compressive atelectasis. No pulmonary nodules, masses, or other focal infiltrates. Evaluation for pulmonary emboli is limited in the lower lobes due to compressive atelectasis and effusions. Within this limitation, no pulmonary emboli are  identified. There are coronary artery calcifications. The thoracic aorta is non aneurysmal with no dissection. The heart size is unchanged. No pericardial effusion. There is adenopathy in the chest and abdomen. The adenopathy is diffuse. For example, there is a node just deep to the left pectoralis muscle on series 4, image 14 measuring 18 x 18 mm. Nodes in the left axilla and elsewhere in the left retro pectoral region are prominent but not enlarged by CT criteria. There is also a node at the midline of the base of neck on series 4, image 6 which is larger in the interval. Enlarging prevascular nodes are seen. Numerous newly enlarged nodes are seen in the epicardial fat best seen on series 4, images 112-115. A representative node on image 113 to the left of midline measures 13 by 16 mm. Numerous enlarged nodes are seen in the upper abdomen as well. A representative node in the fat superior to the right kidney on series 4, image 136  measures 23 by 19 mm. Visualized portions of the adrenal glands, abdominal aorta, stomach, and liver are normal. The spleen is enlarged and heterogeneous but only partially visualized. Increased attenuation is seen in the subcutaneous fat diffusely suggesting volume overload/ edema. Bilateral gynecomastia is noted. There is increased patchy attenuation in the bones, best seen in the spine. The ribs and inferior sternum appear to be involved as well. Review of the MIP images confirms the above findings. IMPRESSION: 1. No pulmonary emboli are identified. 2. Diffuse adenopathy in the chest and upper abdomen in addition to scattered sclerosis throughout the visualized bones is most consistent with metastatic disease. The patient has a history of melanoma and prostate cancer. 3. Bilateral pleural effusions and atelectasis. 4. The spleen remains enlarged and is heterogeneous. The heterogeneity may be at least partially due to see arterial phase imaging. Electronically Signed   By: Dorise Bullion III M.D   On: 06/06/2016 17:22   Mr Brain W Wo Contrast  06/12/2016  CLINICAL DATA:  Confusion.  History of melanoma. EXAM: MRI HEAD WITHOUT AND WITH CONTRAST TECHNIQUE: Multiplanar, multiecho pulse sequences of the brain and surrounding structures were obtained without and with intravenous contrast. CONTRAST:  16mL MULTIHANCE GADOBENATE DIMEGLUMINE 529 MG/ML IV SOLN COMPARISON:  Head CT 05/06/2016 FINDINGS: Calvarium and upper cervical spine: Heterogeneous enhancement throughout the central skullbase and upper cervical spine consistent with metastatic disease. Orbits: Negative. Sinuses and Mastoids: Completely opacified left maxillary antrum with mild atelectasis, consistent with chronic obstruction. No nasal cavity mass is seen. Brain: No evidence of brain metastasis. No acute infarct, hemorrhage, hydrocephalus, or mass lesion. No evidence of large vessel occlusion. Moderate generalized atrophy. IMPRESSION: 1. No acute finding or brain metastasis. 2. Known osseous metastatic disease. 3. Moderate atrophy. 4. Chronically obstructed left maxillary sinus. Electronically Signed   By: Monte Fantasia M.D.   On: 06/12/2016 15:37   US Thoracentesis Asp Pleural Space W/img Guide  06/07/2016  INDICATION: Left pleural effusion. EXAM: ULTRASOUND GUIDED left THORACENTESIS MEDICATIONS: None. COMPLICATIONS: None immediate. PROCEDURE: An ultrasound guided thoracentesis was thoroughly discussed with the patient and questions answered. The benefits, risks, alternatives and complications were also discussed. The patient understands and wishes to proceed with the procedure. Written consent was obtained. Ultrasound was performed to localize and mark an adequate pocket of fluid in the left chest. The area was then prepped and draped in the normal sterile fashion. 1% Lidocaine was used for local anesthesia. Under ultrasound guidance a Safe-T-Centesis catheter was introduced. Thoracentesis was performed. The catheter was  removed and a dressing applied. FINDINGS: A total of approximately 900 mL of serous fluid was removed. Samples were sent to the laboratory as requested by the clinical team. IMPRESSION: Successful ultrasound guided left thoracentesis yielding 900 mL of pleural fluid. Electronically Signed   By: Marijo Conception, M.D.   On: 06/07/2016 13:10    ASSESSMENT & PLAN:   75 year old male patient with metastatic melanoma to the bladder/ bone/ metastases Currently on Combination immunotherapy is currently admitted to the hospital forWorsening shortness of breath.  # Worsening shortness of breath- likely from worsening anemia/likely secondary to bone marrow infiltration of malignancy. Status post 2 units of PRBC transfusion patient feels slightly better.  # Mild/small bilateral pleural effusion/infiltrate- likely third spacing- because of hypo-albuminemia/albumin 1.9- from underlying malignancy. No thoracentesis stated. Antibiotics as per primary team.  #Platelets- 33 thousand. No bleeding. Transfuse platelets less than 20. Likely secondary to bone marrow infiltration.  #  I spoke to the  patient wife/daughter- regarding the likely progression of the disease. However wife still seems to looking forward to further treatment options- which unfortunately might not be case- give the significant decline in the performance status/rapidly progression of disease. I had previously spoken to [place] re: patients status.  Overall poor prognosis.  I also spoke to Dr.Weiting; regarding the above.  Thank you for allowing me to participate in the care of your pleasant patient. Please do not hesitate to contact me with questions or concerns in the interim.     Cammie Sickle, MD 06/12/2016 6:57 PM

## 2016-06-12 NOTE — Progress Notes (Signed)
Lab on unit to draw AM labs, requested patient's routine CBC be drawn now in lieu of another H&H.  Patient has received 2 units PRBC, no transfusion reactions.  Patient's color appears better upon assessment and weakness has diminished.  Will continue to monitor.

## 2016-06-12 NOTE — Progress Notes (Signed)
Pharmacy Antibiotic Note  [name] [name] is a 75 y.o. male admitted on 06/11/2016 with pneumonia.  Pharmacy has been consulted for vancomycin dosing.  Plan: Patient ordered vancomycin 1g IV at 1000. Will start patient on vancomycin 1250mg  IV Q12hr for goal trough of 15-20. Will obtain trough prior to am dose on 7/5.      Height: 5\' 11"  (180.3 cm) Weight: 194 lb 4.8 oz (88.134 kg) IBW/kg (Calculated) : 75.3  Temp (24hrs), Avg:98 F (36.7 C), Min:97.5 F (36.4 C), Max:98.3 F (36.8 C)   Recent Labs Lab 06/06/16 1524 06/06/16 1616 06/07/16 0416 06/08/16 0433 06/11/16 1036 06/12/16 0409  WBC 5.3  --  3.9 4.1 7.1 6.4  CREATININE 0.93  --  0.89 0.93 0.84 0.71  LATICACIDVEN  --  1.9  --   --   --   --     Estimated Creatinine Clearance: 85 mL/min (by C-G formula based on Cr of 0.71).    Allergies  Allergen Reactions  . Ciprofloxacin Nausea And Vomiting and Other (See Comments)    Reaction:  Shaking   . Codeine Other (See Comments)    Reaction:  Cramping     Antimicrobials this admission: Ceftriaxone 7/2 >>  Azithromycin 7/3 >>  Vancomycin 7/3 >>  Dose adjustments this admission: N/A   Microbiology results: 7/2 UCx: pending   7/3 MRSA PCR: pending   Pharmacy will continue to monitor and adjust per consult.    Simpson,Michael L 06/12/2016 4:20 PM

## 2016-06-12 NOTE — Progress Notes (Signed)
   06/12/16 1834  What Happened  Was fall witnessed? Yes  Who witnessed fall? Sivan Cuello, RN and pt's son.  Patients activity before fall ambulating-assisted (to the bathroom)  Point of contact other (comment) (Pt eased to the floor.)  Was patient injured? No  Follow Up  MD notified Dr Ether Griffins.  Time MD notified [place] notified Yes-comment (Pt's son witnessed fall. Spouse at the bedside.)  Time family notified 1825 (Time fall occured.)  Additional tests No  Simple treatment (none)  Progress note created (see row info) Yes  Adult Fall Risk Assessment  Risk Factor Category (scoring not indicated) High fall risk per protocol (document High fall risk)  Age 75  Fall History: Fall within 6 months prior to admission 0  Elimination; Bowel and/or Urine Incontinence 0  Elimination; Bowel and/or Urine Urgency/Frequency 2  Medications: includes PCA/Opiates, Anti-convulsants, Anti-hypertensives, Diuretics, Hypnotics, Laxatives, Sedatives, and Psychotropics 5  Patient Care Equipment 2  Mobility-Assistance 2  Mobility-Gait 2  Mobility-Sensory Deficit 0  Altered awareness of immediate physical environment 0  Impulsiveness 0  Lack of understanding of one's physical/cognitive limitations 0  Total Score 15  Patient's Fall Risk High Fall Risk (>13 points)  Adult Fall Risk Interventions  Required Bundle Interventions *See Row Information* High fall risk - low, moderate, and high requirements implemented  Additional Interventions Individualized elimination schedule;Room near nurses station;Use of appropriate toileting equipment (bedpan, BSC, etc.)  Screening for Fall Injury Risk  Risk For Fall Injury- See Row Information  Nurse judgement  Vitals  BP 107/67 mmHg  BP Location Right Arm  BP Method Automatic  Patient Position (if appropriate) Lying  Pulse Rate (!) 108  Pulse Rate Source Monitor  Resp (!) 22  Oxygen Therapy  SpO2 100 %  O2 Device Nasal Cannula  O2 Flow Rate (L/min) 2 L/min   Pain Assessment  Pain Assessment 0-10  Pain Score 0  Neurological  Neuro (WDL) WDL  Level of Consciousness Alert  Orientation Level Oriented X4  Cognition Appropriate at baseline  Speech Clear  Glasgow Coma Scale  Eye Opening 4  Best Verbal Response (NON-intubated) 5  Best Motor Response 6  Musculoskeletal  Musculoskeletal (WDL) X  Assistive Device Front wheel walker  Generalized Weakness Yes  Weight Bearing Restrictions No  Musculoskeletal Details  RLE Weakness  LLE Weakness  Integumentary  Integumentary (WDL) X  Skin Color Appropriate for ethnicity  Skin Condition Dry  Skin Integrity Other (Comment) (Wound on L heel.Pt/spouse refused dressing removal for eval)  Pain Assessment  Date Pain First Started (Pt denies pain.)  Pain Screening  Clinical Progression Not changed  Pain Assessment  Work-Related Injury No

## 2016-06-12 NOTE — Progress Notes (Signed)
Physical Therapy Evaluation Patient Details Name: [name] [name] MRN: [id number] DOB: [birth year] Today's Date: 06/12/2016   History of Present Illness  [name] [name] is a 75 y.o. male has a past medical history significant for melanoma, ASCVD, and anemia of chronic disease now with severe fatigue and weakness. Unable to stand. Some worsening SOB. Was hospitalized recently for pleural effusion and anemia. Presents to ER today with recurrent pleural effusion and worsening anemia with mild hypoxia.   Clinical Impression  Pt presents to PT with fatigue and generalized weakness, requiring 3L of O2 and would benefit from acute PT services to address objective findings.  Pt easily aroused, but visibly fatigued.  Pt required Min A for supine to sit transfers and CGA for ambulation in room with RW for 15' and no SOB.  Pt's bedroom is on 3 story of home which is a barrier to discharge at this point.      Follow Up Recommendations SNF    Equipment Recommendations  Rolling walker with 5" wheels    Recommendations for Other Services       Precautions / Restrictions Precautions Precautions: Fall Precaution Comments: High Restrictions Weight Bearing Restrictions: No      Mobility  Bed Mobility Overal bed mobility: Needs Assistance Bed Mobility: Supine to Sit;Sit to Supine     Supine to sit: Min assist Sit to supine: Supervision;HOB elevated   General bed mobility comments: Scoots up in bed using B bed rails and pushing with B LE's against bed  Transfers Overall transfer level: Needs assistance Equipment used: Rolling walker (2 wheeled) Transfers: Sit to/from Stand Sit to Stand: Min guard         General transfer comment: verbal cues for safe hand placement with RW; pushing up with one hand on bed and other on RW; bracing against bed with LE's to steady self  Ambulation/Gait Ambulation/Gait assistance: Min guard Ambulation Distance (Feet): 15 Feet Assistive device: Rolling walker (2  wheeled) Gait Pattern/deviations: Step-through pattern     General Gait Details: initially with decreased posterior balance but able to self correct with CGA for safety; amb short distance on room on 3L O2 via n.c.; no c/o SOB  Stairs            Wheelchair Mobility    Modified Rankin (Stroke Patients Only)       Balance Overall balance assessment: Needs assistance       Postural control: Posterior lean Standing balance support: Bilateral upper extremity supported;During functional activity                                 Pertinent Vitals/Pain Pain Assessment: No/denies pain    Home Living Family/patient expects to be discharged to:: Private residence Living Arrangements: Spouse/significant other Available Help at Discharge: Family;Available 24 hours/day Type of Home: House       Home Layout: Multi-level;Bed/bath upstairs (easy chair on main level) Home Equipment: Walker - 2 wheels;Cane - single point;Shower seat      Prior Function Level of Independence: Independent with assistive device(s)         Comments: some help with meals     Hand Dominance   Dominant Hand: Right    Extremity/Trunk Assessment   Upper Extremity Assessment: Overall WFL for tasks assessed           Lower Extremity Assessment: Generalized weakness         Communication   Communication:  No difficulties  Cognition Arousal/Alertness: Lethargic Behavior During Therapy: Flat affect Overall Cognitive Status: Within Functional Limits for tasks assessed (pt just seemed tired, sleepy)                      General Comments General comments (skin integrity, edema, etc.): L heel wound (old), wrapped; has ortho shoe    Exercises        Assessment/Plan    PT Assessment Patient needs continued PT services  PT Diagnosis Difficulty walking;Generalized weakness   PT Problem List Decreased strength;Decreased activity tolerance;Decreased balance;Decreased  mobility;Decreased knowledge of use of DME;Cardiopulmonary status limiting activity  PT Treatment Interventions DME instruction;Gait training;Stair training;Functional mobility training;Therapeutic activities;Therapeutic exercise;Balance training;Patient/family education   PT Goals (Current goals can be found in the Care Plan section) Acute Rehab PT Goals Patient Stated Goal: To get stronger. PT Goal Formulation: With patient/family Time For Goal Achievement: 06/26/16 Potential to Achieve Goals: Good    Frequency Min 2X/week   Barriers to discharge Inaccessible home environment bedroom on 3rd floor of home, recliner on main level    Co-evaluation               End of Session Equipment Utilized During Treatment: Gait belt;Oxygen (3L) Activity Tolerance: Patient tolerated treatment well Patient left: in bed;with call bell/phone within reach;with bed alarm set;with family/visitor present Nurse Communication: Mobility status         Time: [id number] PT Time Calculation (min) (ACUTE ONLY): 32 min   Charges:   PT Evaluation $PT Eval Moderate Complexity: 1 Procedure     PT G Codes:        Donata Reddick A Josefa [place], PT 06/12/2016, 12:31 PM

## 2016-06-12 NOTE — Progress Notes (Signed)
Pharmacy Antibiotic Note  [name] [name] is a 75 y.o. male admitted on 06/11/2016 with pneumonia.  Pharmacy has been consulted for Vancomycin dosing.  Plan: After discussion with Dr. Jannifer [name], antibiotics will be changed by stopping Vancomycin.  Height: 5\' 11"  (180.3 cm) Weight: 194 lb 4.8 oz (88.134 kg) IBW/kg (Calculated) : 75.3  Temp (24hrs), Avg:98 F (36.7 C), Min:97.6 F (36.4 C), Max:98.3 F (36.8 C)   Recent Labs Lab 06/06/16 1524 06/06/16 1616 06/07/16 0416 06/08/16 0433 06/11/16 1036 06/12/16 0409  WBC 5.3  --  3.9 4.1 7.1 6.4  CREATININE 0.93  --  0.89 0.93 0.84 0.71  LATICACIDVEN  --  1.9  --   --   --   --     Estimated Creatinine Clearance: 85 mL/min (by C-G formula based on Cr of 0.71).    Allergies  Allergen Reactions  . Ciprofloxacin Nausea And Vomiting and Other (See Comments)    Reaction:  Shaking   . Codeine Other (See Comments)    Reaction:  Cramping     Microbiology results: 7/3 MRSA PCR: negative  Thank you for allowing pharmacy to be a part of this patient's care.  [name] 06/12/2016 9:20 PM

## 2016-06-13 LAB — URINE CULTURE

## 2016-06-13 LAB — TYPE AND SCREEN
ABO/RH(D): O POS
Antibody Screen: NEGATIVE
Unit division: 0
Unit division: 0

## 2016-06-13 LAB — GLUCOSE, CAPILLARY
GLUCOSE-CAPILLARY: 222 mg/dL — AB (ref 65–99)
Glucose-Capillary: 244 mg/dL — ABNORMAL HIGH (ref 65–99)
Glucose-Capillary: 255 mg/dL — ABNORMAL HIGH (ref 65–99)
Glucose-Capillary: 213 mg/dL — ABNORMAL HIGH (ref 65–99)

## 2016-06-13 NOTE — Progress Notes (Signed)
Patient ID: NOEMI BRISSEY, male   DOB: 10/29/1941, 75 y.o.   MRN: [id number] [facility] PROGRESS NOTE  KEHINDE PAREKH [id number] DOB: 1941/11/18 DOA: 06/11/2016 PCP: Coral Spikes, DO  HPI/Subjective: Patient feeling better.  He states his vitals are stable. Some cough. Still with weakness.  Objective: Filed Vitals:   06/13/16 0434 06/13/16 0839  BP:  134/69  Pulse:  99  Temp: 97.8 F (36.6 C)   Resp:      Filed Weights   06/11/16 1029 06/12/16 0437 06/13/16 0429  Weight: 88.451 kg (195 lb) 88.134 kg (194 lb 4.8 oz) 88.8 kg (195 lb 12.3 oz)    ROS: Review of Systems  Constitutional: Negative for fever and chills.  Eyes: Negative for blurred vision.  Respiratory: Positive for cough and shortness of breath.   Cardiovascular: Negative for chest pain.  Gastrointestinal: Negative for nausea, vomiting, abdominal pain, diarrhea and constipation.  Genitourinary: Negative for dysuria.  Musculoskeletal: Negative for joint pain.  Neurological: Negative for dizziness and headaches.   Exam: Physical Exam  HENT:  Nose: No mucosal edema.  Mouth/Throat: No oropharyngeal exudate or posterior oropharyngeal edema.  Eyes: Conjunctivae, EOM and lids are normal. Pupils are equal, round, and reactive to light.  Neck: No JVD present. Carotid bruit is not present. No edema present. No thyroid mass and no thyromegaly present.  Cardiovascular: S1 normal and S2 normal.  Exam reveals no gallop.   No murmur heard. Pulses:      Dorsalis pedis pulses are 2+ on the right side, and 2+ on the left side.  Respiratory: No respiratory distress. He has no wheezes. He has rhonchi in the right lower field. He has no rales.  GI: Soft. Bowel sounds are normal. There is no tenderness.  Musculoskeletal:       Right ankle: He exhibits swelling.       Left ankle: He exhibits swelling.  Lymphadenopathy:    He has no cervical adenopathy.  Neurological: He is alert. No cranial nerve deficit.  Skin: Skin is  warm. No rash noted. Nails show no clubbing.  Psychiatric: He has a normal mood and affect.      Data Reviewed: Basic Metabolic Panel:  Recent Labs Lab 06/06/16 1524 06/07/16 0416 06/08/16 0433 06/11/16 1036 06/12/16 0409  NA 131* 133* 133* 130* 131*  K 3.8 4.0 4.0 4.1 4.1  CL 95* 99* 100* 96* 98*  CO2 24 26 26 26 26   GLUCOSE 156* 125* 122* 217* 203*  BUN 29* 30* 30* 38* 36*  CREATININE 0.93 0.89 0.93 0.84 0.71  CALCIUM 7.8* 7.6* 7.8* 7.6* 7.5*   Liver Function Tests:  Recent Labs Lab 06/06/16 1524 06/11/16 1036 06/12/16 0409  AST 51* 43* 46*  ALT 15* 16* 16*  ALKPHOS 137* 233* 248*  BILITOT 1.1 1.1 1.7*  PROT 4.8* 4.8* 4.6*  ALBUMIN 2.1* 1.9* 1.9*   CBC:  Recent Labs Lab 06/06/16 1524 06/07/16 0416 06/08/16 0433 06/11/16 1036 06/12/16 0409  WBC 5.3 3.9 4.1 7.1 6.4  NEUTROABS  --   --   --  5.8  --   HGB 9.7* 8.2* 8.6* 7.2* 9.7*  HCT 28.7* 24.8* 26.2* 21.7* 28.4*  MCV 84.9 85.4 84.3 84.3 83.8  PLT 64* 47* 48* 44* 30*   Cardiac Enzymes:  Recent Labs Lab 06/06/16 1524 06/11/16 1036  TROPONINI <0.03 <0.03   BNP (last 3 results)  Recent Labs  06/06/16 1524  BNP 73.0    ProBNP (last 3 results)  Recent  Labs  01/11/16 1217  PROBNP 137.0*    CBG:  Recent Labs Lab 06/12/16 1146 06/12/16 1634 06/12/16 2209 06/13/16 0722 06/13/16 1136  GLUCAP 253* 279* 259* 244* 255*       Studies: Dg Chest 2 View  06/12/2016  CLINICAL DATA:  Follow-up bilateral pleural effusions. EXAM: CHEST  2 VIEW COMPARISON:  06/11/2016. FINDINGS: Prior CABG. Bibasilar subsegmental atelectasis. Mild left lower lobe infiltrate. Small left pleural effusion again noted. Tiny right pleural effusion cannot be excluded . No pneumothorax. IMPRESSION: 1. Prior CABG. 2. Bibasilar subsegmental atelectasis. Mild left lower lobe infiltrate and small left pleural effusion. Tiny right effusion cannot be entirely excluded . No significant change from prior exam . Electronically  Signed   By: Marcello Moores  Register   On: 06/12/2016 08:11   Mr Brain W Wo Contrast  06/12/2016  CLINICAL DATA:  Confusion.  History of melanoma. EXAM: MRI HEAD WITHOUT AND WITH CONTRAST TECHNIQUE: Multiplanar, multiecho pulse sequences of the brain and surrounding structures were obtained without and with intravenous contrast. CONTRAST:  65mL MULTIHANCE GADOBENATE DIMEGLUMINE 529 MG/ML IV SOLN COMPARISON:  Head CT 05/06/2016 FINDINGS: Calvarium and upper cervical spine: Heterogeneous enhancement throughout the central skullbase and upper cervical spine consistent with metastatic disease. Orbits: Negative. Sinuses and Mastoids: Completely opacified left maxillary antrum with mild atelectasis, consistent with chronic obstruction. No nasal cavity mass is seen. Brain: No evidence of brain metastasis. No acute infarct, hemorrhage, hydrocephalus, or mass lesion. No evidence of large vessel occlusion. Moderate generalized atrophy. IMPRESSION: 1. No acute finding or brain metastasis. 2. Known osseous metastatic disease. 3. Moderate atrophy. 4. Chronically obstructed left maxillary sinus. Electronically Signed   By: Monte Fantasia M.D.   On: 06/12/2016 15:37    Scheduled Meds: . atorvastatin  10 mg Oral Daily  . azithromycin  250 mg Oral Daily  . cefTRIAXone (ROCEPHIN)  IV  1 g Intravenous Daily  . cholecalciferol  1,000 Units Oral Daily  . docusate sodium  100 mg Oral BID  . DULoxetine  30 mg Oral Daily  . famotidine  20 mg Oral BID  . insulin aspart  0-9 Units Subcutaneous TID WC  . insulin aspart protamine- aspart  20 Units Subcutaneous BID WC  . primidone  50 mg Oral Daily  . sodium chloride flush  3 mL Intravenous Q12H  . tamsulosin  0.4 mg Oral QPC breakfast  . vitamin B-12  1,000 mcg Oral Daily   Continuous Infusions: . sodium chloride 30 mL/hr at 06/13/16 1000    Assessment/Plan:  1. Pneumonia with confusion. Rocephin, vancomycin and Zithromax. Repeat chest x-ray only shows a small left pleural  effusion. 2. Metastatic melanoma. Received 2 outpatient immunotherapy treatments.MRI of the brain negative for brain metastasis. Spoke with pathologist the thoracentesis fluid was negative for cancer. 3. Symptomatic anemia. Patient responded to 2 units of packed red blood cells 4. 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 5. Weakness. Physical therapy recommended a rehabilitation 6. Type 2 diabetes. Continue his usual insulin regimen. 7. BPH on Flomax 8. Hyperlipidemia unspecified old atorvastatin.  Code Status:     Code Status Orders        Start     Ordered   06/11/16 1733  Full code   Continuous     06/11/16 1732    Code Status History    Date Active Date Inactive Code Status Order ID Comments User Context   06/06/2016  6:11 PM 06/08/2016  5:51 PM Full Code [id number]  Srikar  Sudini, MD ED   05/08/2016  1:03 AM 05/10/2016 10:53 AM Full Code [id number]  Quintella Baton, MD Inpatient    Advance Directive Documentation        Most Recent Value   Type of Advance Directive  Healthcare Power of Attorney   Pre-existing out of facility DNR order (yellow form or pink MOST form)     "MOST" Form in Place?       Family Communication: Left message for daughter on the phone. Disposition Plan: to be determined  Consultants:  oncology  Antibiotics:  Vancomycin  Rocephin  Zithromax  Time spent: 23 minutes  [place], [place]

## 2016-06-14 ENCOUNTER — Inpatient Hospital Stay: Payer: Medicare Other

## 2016-06-14 LAB — BASIC METABOLIC PANEL
ANION GAP: 10 (ref 5–15)
BUN: 35 mg/dL — AB (ref 6–20)
CHLORIDE: 97 mmol/L — AB (ref 101–111)
CO2: 23 mmol/L (ref 22–32)
Calcium: 8.1 mg/dL — ABNORMAL LOW (ref 8.9–10.3)
Creatinine, Ser: 0.84 mg/dL (ref 0.61–1.24)
GFR calc Af Amer: >60 mL/min (ref >60)
GLUCOSE: 145 mg/dL — AB (ref 65–99)
POTASSIUM: 4.4 mmol/L (ref 3.5–5.1)
Sodium: 130 mmol/L — ABNORMAL LOW (ref 135–145)

## 2016-06-14 LAB — CBC
HEMATOCRIT: 25.3 % — AB (ref 40.0–52.0)
HEMOGLOBIN: 8.6 g/dL — AB (ref 13.0–18.0)
MCH: 28.5 pg (ref 26.0–34.0)
MCHC: 33.9 g/dL (ref 32.0–36.0)
MCV: 84.1 fL (ref 80.0–100.0)
PLATELETS: 21 10*3/uL — AB (ref 150–440)
RBC: 3.01 MIL/uL — ABNORMAL LOW (ref 4.40–5.90)
RDW: 17.2 % — AB (ref 11.5–14.5)
WBC: 7.9 10*3/uL (ref 3.8–10.6)

## 2016-06-14 LAB — GLUCOSE, CAPILLARY
GLUCOSE-CAPILLARY: 144 mg/dL — AB (ref 65–99)
Glucose-Capillary: 163 mg/dL — ABNORMAL HIGH (ref 65–99)
Glucose-Capillary: 191 mg/dL — ABNORMAL HIGH (ref 65–99)
Glucose-Capillary: 156 mg/dL — ABNORMAL HIGH (ref 65–99)
Glucose-Capillary: 104 mg/dL — ABNORMAL HIGH (ref 65–99)

## 2016-06-14 MED ORDER — INSULIN ASPART 100 UNIT/ML ~~LOC~~ SOLN: 0.0000 [IU] | Freq: Every day | SUBCUTANEOUS | Status: DC

## 2016-06-14 MED ORDER — INSULIN ASPART 100 UNIT/ML ~~LOC~~ SOLN
0.0000 [IU] | Freq: Three times a day (TID) | SUBCUTANEOUS | Status: DC
  Administered 2016-06-14: 17:00:00 2 [IU] via SUBCUTANEOUS
  Filled 2016-06-14: qty 3

## 2016-06-14 NOTE — Progress Notes (Signed)
Inpatient Diabetes Program Recommendations  AACE/ADA: New Consensus Statement on Inpatient Glycemic Control (2015)  Target Ranges:  Prepandial:   less than 140 mg/dL      Peak postprandial:   less than 180 mg/dL (1-2 hours)      Critically ill patients:  140 - 180 mg/dL   Lab Results  Component Value Date   GLUCAP 191* 06/14/2016   HGBA1C 6.4 01/24/2016    Review of Glycemic Control  Results for [name] [name], [name] [name] (MRN [id number]) as of 06/14/2016 11:10  Ref. Range 06/13/2016 07:22 06/13/2016 11:36 06/13/2016 17:12 06/13/2016 20:24 06/14/2016 07:46  Glucose-Capillary Latest Ref Range: 65-99 mg/dL 244 (H) 255 (H) 213 (H) 222 (H) 163 (H)    Diabetes history: Type 2 Outpatient Diabetes medications: Humolog 75/25 20-30 units bid Current orders for Inpatient glycemic control: Novolog 70/30 20 units bid, Novolog sensitive correction 0-9 units tid  Inpatient Diabetes Program Recommendations:  Elevated post prandial blood sugars despite Novolog sensitive correction currently ordered- please consider increasing Novolog correction to moderate correction scale and add Novolog correction 0-5 units qhs.   Gentry Fitz, RN, BA, MHA, CDE Diabetes Coordinator Inpatient Diabetes Program  [phone] (Team Pager) [phone] ([place]) 06/14/2016 11:21 AM

## 2016-06-14 NOTE — Care Management Important Message (Signed)
Important Message  Patient Details  Name: [name] [name] MRN: [id number] Date of Birth: 11/03/1941   Medicare Important Message Given:  Yes    [name] 06/14/2016, 10:45 AM

## 2016-06-14 NOTE — Progress Notes (Signed)
Patient ID: [name] [name], male   DOB: June 10, 1941, 75 y.o.   MRN: [id number] [name] [name] PROGRESS NOTE  [name] [name] [id number] DOB: 26-Dec-1940 DOA: 06/11/2016 PCP: Coral Spikes, DO  HPI/Subjective: Seems confused, weak. Wife worried that he's getting more weak and not getting daily PT while here.  Objective: Filed Vitals:   06/14/16 0425 06/14/16 0858  BP: 117/55 117/61  Pulse: 103 102  Temp: 97.8 F (36.6 C)   Resp: 18     Filed Weights   06/12/16 0437 06/13/16 0429 06/14/16 0451  Weight: 88.134 kg (194 lb 4.8 oz) 88.8 kg (195 lb 12.3 oz) 90.039 kg (198 lb 8 oz)    ROS: Review of Systems  Constitutional: Negative for fever and chills.  Eyes: Negative for blurred vision.  Respiratory: Positive for cough and shortness of breath.   Cardiovascular: Negative for chest pain.  Gastrointestinal: Negative for nausea, vomiting, abdominal pain, diarrhea and constipation.  Genitourinary: Negative for dysuria.  Musculoskeletal: Negative for joint pain.  Neurological: Positive for dizziness. Negative for headaches.   Exam: Physical Exam  HENT:  Nose: No mucosal edema.  Mouth/Throat: No oropharyngeal exudate or posterior oropharyngeal edema.  Eyes: Conjunctivae, EOM and lids are normal. Pupils are equal, round, and reactive to light.  Neck: No JVD present. Carotid bruit is not present. No edema present. No thyroid mass and no thyromegaly present.  Cardiovascular: S1 normal and S2 normal.  Exam reveals no gallop.   No murmur heard. Respiratory: No respiratory distress. He has no wheezes. He has no rhonchi. He has no rales.  GI: Soft. Bowel sounds are normal. There is no tenderness.  Musculoskeletal:       Right ankle: He exhibits no swelling.       Left ankle: He exhibits no swelling.  Lymphadenopathy:    He has no cervical adenopathy.  Neurological: He is alert. No cranial nerve deficit.  Skin: Skin is warm. No rash noted. Nails show no clubbing.  Psychiatric: He has a  normal mood and affect.    Data Reviewed: Basic Metabolic Panel:  Recent Labs Lab 06/08/16 0433 06/11/16 1036 06/12/16 0409 06/14/16 0447  NA 133* 130* 131* 130*  K 4.0 4.1 4.1 4.4  CL 100* 96* 98* 97*  CO2 26 26 26 23   GLUCOSE 122* 217* 203* 145*  BUN 30* 38* 36* 35*  CREATININE 0.93 0.84 0.71 0.84  CALCIUM 7.8* 7.6* 7.5* 8.1*   Liver Function Tests:  Recent Labs Lab 06/11/16 1036 06/12/16 0409  AST 43* 46*  ALT 16* 16*  ALKPHOS 233* 248*  BILITOT 1.1 1.7*  PROT 4.8* 4.6*  ALBUMIN 1.9* 1.9*   CBC:  Recent Labs Lab 06/08/16 0433 06/11/16 1036 06/12/16 0409 06/14/16 0631  WBC 4.1 7.1 6.4 7.9  NEUTROABS  --  5.8  --   --   HGB 8.6* 7.2* 9.7* 8.6*  HCT 26.2* 21.7* 28.4* 25.3*  MCV 84.3 84.3 83.8 84.1  PLT 48* 44* 30* 21*    CBG:  Recent Labs Lab 06/13/16 0722 06/13/16 1136 06/13/16 1712 06/13/16 2024 06/14/16 0746  GLUCAP 244* 255* 213* 222* 163*       Studies: Dg Chest 2 View  06/14/2016  CLINICAL DATA:  Pneumonia, history of diabetes, coronary artery disease, nonsmoker. EXAM: CHEST  2 VIEW COMPARISON:  Chest x-ray of June 12, 2016 FINDINGS: The exam is performed in a lordotic projection. The lungs remain mildly hypoinflated. Bibasilar densities are present consistent with atelectasis and pleural effusions. The heart  is normal in size. The pulmonary vascularity is not engorged. The patient has undergone previous CABG. The bony thorax exhibits no acute abnormality. There is chronic deformity of the lateral aspect of the left fifth rib. IMPRESSION: Small bilateral pleural effusions. Bibasilar atelectasis or pneumonia greater on the left. No significant pulmonary edema. Electronically Signed   By: David  Martinique M.D.   On: 06/14/2016 07:35   Mr [name] [name] [id number] Contrast  06/12/2016  CLINICAL DATA:  Confusion.  History of melanoma. EXAM: MRI HEAD WITHOUT AND WITH CONTRAST TECHNIQUE: Multiplanar, multiecho pulse sequences of the brain and surrounding structures  were obtained without and with intravenous contrast. CONTRAST:  91mL MULTIHANCE GADOBENATE DIMEGLUMINE 529 MG/ML IV SOLN COMPARISON:  Head CT 05/06/2016 FINDINGS: Calvarium and upper cervical spine: Heterogeneous enhancement throughout the central skullbase and upper cervical spine consistent with metastatic disease. Orbits: Negative. Sinuses and Mastoids: Completely opacified left maxillary antrum with mild atelectasis, consistent with chronic obstruction. No nasal cavity mass is seen. Brain: No evidence of brain metastasis. No acute infarct, hemorrhage, hydrocephalus, or mass lesion. No evidence of large vessel occlusion. Moderate generalized atrophy. IMPRESSION: 1. No acute finding or brain metastasis. 2. Known osseous metastatic disease. 3. Moderate atrophy. 4. Chronically obstructed left maxillary sinus. Electronically Signed   By: Monte Fantasia M.D.   On: 06/12/2016 15:37    Scheduled Meds: . azithromycin  250 mg Oral Daily  . cefTRIAXone (ROCEPHIN)  IV  1 g Intravenous Daily  . cholecalciferol  1,000 Units Oral Daily  . docusate sodium  100 mg Oral BID  . famotidine  20 mg Oral BID  . insulin aspart  0-9 Units Subcutaneous TID WC  . insulin aspart protamine- aspart  20 Units Subcutaneous BID WC  . primidone  50 mg Oral Daily  . sodium chloride flush  3 mL Intravenous Q12H  . tamsulosin  0.4 mg Oral QPC breakfast  . vitamin B-12  1,000 mcg Oral Daily   Continuous Infusions: . sodium chloride 30 mL/hr at 06/13/16 1000    Assessment/Plan:  1. Pneumonia with confusion: continue Rocephin and Zithromax for 2 more days. Repeat chest x-ray only shows a small left pleural effusion.  2. Acute metabolic encephalopathy: could be multifactorial, will stop tramadol and cymbalta as those r new meds this admission and see if that helps.  3. Metastatic melanoma. Received 2 outpatient immunotherapy treatments.MRI of the brain negative for brain metastasis. thoracentesis fluid was negative for  cancer. 4. Symptomatic anemia. Patient responded to 2 units of packed red blood cells. Hb stable 5. 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 6. Weakness. Physical therapy recommended a rehabilitation, stop lipitor as that can also contribute to muscle weakness. 7. Type 2 diabetes. Continue his usual insulin regimen. 8. BPH on Flomax 9. Hyperlipidemia unspecified old atorvastatin. Stopped.  Code Status: FULL CODE Family Communication: d/w wife at bedside Disposition Plan: to be determined, Needs STR/SNF placement  Consultants:  oncology  Antibiotics:  Rocephin  Zithromax  Time spent: 23 minutes  [place], [place]

## 2016-06-15 ENCOUNTER — Other Ambulatory Visit: Payer: Self-pay | Admitting: Internal Medicine

## 2016-06-15 ENCOUNTER — Ambulatory Visit: Payer: BLUE CROSS/BLUE SHIELD | Admitting: Family Medicine

## 2016-06-15 ENCOUNTER — Telehealth: Payer: Self-pay | Admitting: *Deleted

## 2016-06-15 LAB — CBC
HEMATOCRIT: 26.4 % — AB (ref 40.0–52.0)
Hemoglobin: 8.8 g/dL — ABNORMAL LOW (ref 13.0–18.0)
MCH: 28.2 pg (ref 26.0–34.0)
MCHC: 33.4 g/dL (ref 32.0–36.0)
MCV: 84.3 fL (ref 80.0–100.0)
Platelets: 23 10*3/uL — CL (ref 150–440)
RBC: 3.13 MIL/uL — AB (ref 4.40–5.90)
RDW: 17.5 % — ABNORMAL HIGH (ref 11.5–14.5)
WBC: 10.1 10*3/uL (ref 3.8–10.6)

## 2016-06-15 LAB — GLUCOSE, CAPILLARY
GLUCOSE-CAPILLARY: 83 mg/dL (ref 65–99)
GLUCOSE-CAPILLARY: 104 mg/dL — AB (ref 65–99)
Glucose-Capillary: 73 mg/dL (ref 65–99)

## 2016-06-15 MED ORDER — AZITHROMYCIN 250 MG PO TABS: 250.0000 mg | ORAL_TABLET | Freq: Every day | ORAL | Status: DC

## 2016-06-15 NOTE — Clinical Social Work Note (Signed)
Pt is ready for discharge today. CSW presented bed offers. Pt's daughter chose [facility]. Facility is ready to accept pt at discharge as they have received discharge information. Pt and family are aware and agreeable to discharge plan. RN called report and EMS will provide transportation. CSW is signing off as no further needs identified.   [name] [name], MSW, LCSW  Clinical Social Worker  [phone]

## 2016-06-15 NOTE — Discharge Summary (Signed)
[facility] NAME: Name [name] [name]    MR#:  [id number]  DATE OF BIRTH:  1941/04/18  DATE OF ADMISSION:  06/11/2016 ADMITTING PHYSICIAN: Idelle Crouch, MD  DATE OF DISCHARGE: 06/15/2016 PRIMARY CARE PHYSICIAN: Coral Spikes, DO    ADMISSION DIAGNOSIS:  Dyspnea [R06.00] Bilateral pleural effusion [J90] Pleural effusion, bilateral [J90] Anemia, unspecified anemia type [D64.9]   DISCHARGE DIAGNOSIS:  Pneumonia  Acute metabolic encephalopathy Metastatic melanoma. Symptomatic anemia. 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 SECONDARY DIAGNOSIS:   Past Medical History  Diagnosis Date  . Insulin dependent diabetes mellitus ([place])   . Arteriosclerotic coronary artery disease   . Peyronie's disease   . Prostatitis   . Benign essential tremor   . Hyperlipidemia   . Bladder diverticulum   . Melanoma ([place])   . 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    HOSPITAL COURSE:   1. Pneumonia with confusion: treated wtih Rocephin and Zithromax for 1 more day. Repeat chest x-ray only shows a small left pleural effusion.  2. Acute metabolic encephalopathy: could be multifactorial, improved after stopping tramadol and cymbalta ( those r new meds this admission and see if that helps).  3. Metastatic melanoma. Received 2 outpatient immunotherapy treatments.MRI of the brain negative for brain metastasis. thoracentesis fluid was negative for cancer. 4. Symptomatic anemia. Patient responded to 2 units of packed red blood cells. Hb stable 5. 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 F/u oncology as outpatient. 6. Weakness. Physical therapy recommended a rehabilitation, stopped lipitor as that can also contribute to muscle weakness. 7. Type 2 diabetes. Continue his usual insulin regimen. 8. BPH on  Flomax 9. Hyperlipidemia unspecified, atorvastatin was stopped. 10. Chronic hyponatremia.  DISCHARGE CONDITIONS:   Stable, discharge to SNF today.  CONSULTS OBTAINED:  Treatment Team:  Cammie Sickle, MD Maryruth Hancock, MD  DRUG ALLERGIES:   Allergies  Allergen Reactions  . Ciprofloxacin Nausea And Vomiting and Other (See Comments)    Reaction:  Shaking   . Codeine Other (See Comments)    Reaction:  Cramping     DISCHARGE MEDICATIONS:   Current Discharge Medication List    START taking these medications   Details  azithromycin (ZITHROMAX) 250 MG tablet Take 1 tablet (250 mg total) by mouth daily. Qty: 1 each, Refills: 0      CONTINUE these medications which have NOT CHANGED   Details  cholecalciferol (VITAMIN D) 1000 UNITS tablet Take 1,000 Units by mouth daily.      docusate sodium (COLACE) 100 MG capsule Take 100 mg by mouth daily.    furosemide (LASIX) 20 MG tablet Take one tab po daily as needed for swelling or shortness of breath Qty: 30 tablet, Refills: 0    insulin lispro protamine-lispro (HUMALOG MIX 75/25) (75-25) 100 UNIT/ML SUSP injection Inject 20-30 Units into the skin 2 (two) times daily with a meal. Pt uses per sliding scale.    nitrofurantoin, macrocrystal-monohydrate, (MACROBID) 100 MG capsule Take 100 mg by mouth daily.    polyethylene glycol (MIRALAX / GLYCOLAX) packet Take 17 g by mouth daily as needed for mild constipation. Qty: 14 each, Refills: 0    primidone (MYSOLINE) 50 MG tablet Take 50 mg by mouth daily.     tamsulosin (FLOMAX) 0.4 MG CAPS capsule Take 0.4 mg by mouth daily after breakfast.  Turmeric 500 MG CAPS Take 500 mg by mouth daily.    vitamin B-12 (CYANOCOBALAMIN) 1000 MCG tablet Take 1,000 mcg by mouth daily.      STOP taking these medications     atorvastatin (LIPITOR) 10 MG tablet      traMADol (ULTRAM) 50 MG tablet          DISCHARGE INSTRUCTIONS:    If you experience worsening of your admission  symptoms, develop shortness of breath, life threatening emergency, suicidal or homicidal thoughts you must seek medical attention immediately by calling 911 or calling your MD immediately  if symptoms less severe.  You Must read complete instructions/literature along with all the possible adverse reactions/side effects for all the Medicines you take and that have been prescribed to you. Take any new Medicines after you have completely understood and accept all the possible adverse reactions/side effects.   Please note  You were cared for by a hospitalist during your hospital stay. If you have any questions about your discharge medications or the care you received while you were in the hospital after you are discharged, you can call the unit and asked to speak with the hospitalist on call if the hospitalist that took care of you is not available. Once you are discharged, your primary care physician will handle any further medical issues. Please note that NO REFILLS for any discharge medications will be authorized once you are discharged, as it is imperative that you return to your primary care physician (or establish a relationship with a primary care physician if you do not have one) for your aftercare needs so that they can reassess your need for medications and monitor your lab values.    Today   SUBJECTIVE    No complaint.   VITAL SIGNS:  Blood pressure 118/61, pulse 110, temperature 97.6 F (36.4 C), temperature source Oral, resp. rate 18, height 5\' 11"  (1.803 m), weight 201 lb 12.8 oz (91.536 kg), SpO2 100 %.  I/O:   Intake/Output Summary (Last 24 hours) at 06/15/16 0927 Last data filed at 06/14/16 1700  Gross per 24 hour  Intake    240 ml  Output      0 ml  Net    240 ml    PHYSICAL EXAMINATION:  GENERAL:  75 y.o.-year-old patient lying in the bed with no acute distress.  EYES: Pupils equal, round, reactive to light and accommodation. No scleral icterus. Extraocular muscles  intact.  HEENT: Head atraumatic, normocephalic. Oropharynx and nasopharynx clear.  NECK:  Supple, no jugular venous distention. No thyroid enlargement, no tenderness.  LUNGS: Normal breath sounds bilaterally, no wheezing, rales,rhonchi or crepitation. No use of accessory muscles of respiration.  CARDIOVASCULAR: S1, S2 normal. No murmurs, rubs, or gallops.  ABDOMEN: Soft, non-tender, non-distended. Bowel sounds present. No organomegaly or mass.  EXTREMITIES: positive b/l leg edema, no cyanosis, or clubbing.  NEUROLOGIC: Cranial nerves II through XII are intact. Muscle strength 5/5 in all extremities. Sensation intact. Gait not checked.  PSYCHIATRIC: The patient is alert and oriented x 3.  SKIN: No obvious rash, lesion, or ulcer.   DATA REVIEW:   CBC  Recent Labs Lab 06/15/16 0744  WBC 10.1  HGB 8.8*  HCT 26.4*  PLT 23*    Chemistries   Recent Labs Lab 06/12/16 0409 06/14/16 0447  NA 131* 130*  K 4.1 4.4  CL 98* 97*  CO2 26 23  GLUCOSE 203* 145*  BUN 36* 35*  CREATININE 0.71 0.84  CALCIUM 7.5* 8.1*  AST 46*  --   ALT 16*  --   ALKPHOS 248*  --   BILITOT 1.7*  --     Cardiac Enzymes  Recent Labs Lab 06/11/16 1036  TROPONINI <0.03    Microbiology Results  Results for orders placed or performed during the hospital encounter of 06/11/16  Urine culture     Status: Abnormal   Collection Time: 06/11/16  8:45 PM  Result Value Ref Range Status   Specimen Description URINE, CLEAN CATCH  Final   Special Requests Immunocompromised  Final   Culture MULTIPLE SPECIES PRESENT, SUGGEST RECOLLECTION (A)  Final   Report Status 06/13/2016 FINAL  Final  MRSA PCR Screening     Status: None   Collection Time: 06/12/16  2:30 PM  Result Value Ref Range Status   MRSA by PCR NEGATIVE NEGATIVE Final    Comment:        The GeneXpert MRSA Assay (FDA approved for NASAL specimens only), is one component of a comprehensive MRSA colonization surveillance program. It is not intended  to diagnose MRSA infection nor to guide or monitor treatment for MRSA infections.     RADIOLOGY:  Dg Chest 2 View  06/14/2016  CLINICAL DATA:  Pneumonia, history of diabetes, coronary artery disease, nonsmoker. EXAM: CHEST  2 VIEW COMPARISON:  Chest x-ray of June 12, 2016 FINDINGS: The exam is performed in a lordotic projection. The lungs remain mildly hypoinflated. Bibasilar densities are present consistent with atelectasis and pleural effusions. The heart is normal in size. The pulmonary vascularity is not engorged. The patient has undergone previous CABG. The bony thorax exhibits no acute abnormality. There is chronic deformity of the lateral aspect of the left fifth rib. IMPRESSION: Small bilateral pleural effusions. Bibasilar atelectasis or pneumonia greater on the left. No significant pulmonary edema. Electronically Signed   By: David  Martinique M.D.   On: 06/14/2016 07:35        Management plans discussed with the patient, family and they are in agreement.  CODE STATUS:     Code Status Orders        Start     Ordered   06/11/16 1733  Full code   Continuous     06/11/16 1732    Code Status History    Date Active Date Inactive Code Status Order ID Comments User Context   06/06/2016  6:11 PM 06/08/2016  5:51 PM Full Code [id number]  Hillary Bow, MD ED   05/08/2016  1:03 AM 05/10/2016 10:53 AM Full Code [id number]  Quintella Baton, MD Inpatient    Advance Directive Documentation        Most Recent Value   Type of Advance Directive  Healthcare Power of Attorney   Pre-existing out of facility DNR order (yellow form or pink MOST form)     "MOST" Form in Place?        TOTAL TIME TAKING CARE OF THIS PATIENT: 37 minutes.    Demetrios Loll M.D on 06/15/2016 at 9:27 AM  Between 7am to 6pm - Pager - [phone]  After 6pm go to [email or web address] - password [email or web address] [facility]  Office  [phone]  CC: Primary care physician; Coral Spikes, DO

## 2016-06-15 NOTE — Progress Notes (Signed)
Pt is being discharged to [facility]. Discharge packet in pt's slot. Report given to [facility], LPN. Awaiting EMS.

## 2016-06-15 NOTE — Discharge Instructions (Signed)
Heart healthy and ADA diet. °Activity as tolerated. °

## 2016-06-15 NOTE — Telephone Encounter (Signed)
Patient will discharge from [facility] on today.

## 2016-06-15 NOTE — Clinical Social Work Placement (Signed)
   CLINICAL SOCIAL WORK PLACEMENT  NOTE  Date:  06/15/2016  Patient Details  Name: [name] [name] MRN: [id number] Date of Birth: December 22, 1940  Clinical Social Work is seeking post-discharge placement for this patient at the [place] level of care (*CSW will initial, date and re-position this form in  chart as items are completed):  Yes   Patient/family provided with [facility] Work Department's list of facilities offering this level of care within the geographic area requested by the patient (or if unable, by the patient's family).  Yes   Patient/family informed of their freedom to choose among providers that offer the needed level of care, that participate in Medicare, Medicaid or managed care program needed by the patient, have an available bed and are willing to accept the patient.  Yes   Patient/family informed of [facility]'s ownership interest in [facility] and [facility], as well as of the fact that they are under no obligation to receive care at these facilities.  PASRR submitted to EDS on 06/12/16     PASRR number received on 06/12/16     Existing PASRR number confirmed on       FL2 transmitted to all facilities in geographic area requested by pt/family on 06/12/16     FL2 transmitted to all facilities within larger geographic area on       Patient informed that his/her managed care company has contracts with or will negotiate with certain facilities, including the following:        Yes   Patient/family informed of bed offers received.  Patient chooses bed at [facility]     Physician recommends and patient chooses bed at      Patient to be transferred to [facility] on 06/15/16.  Patient to be transferred to facility by [facility]     Patient family notified on 06/15/16 of transfer.  Name of family member notified:  Pt's wife and daughter     PHYSICIAN       Additional Comment:     _______________________________________________ Darden Dates, LCSW 06/15/2016, 2:24 PM

## 2016-06-16 NOTE — Telephone Encounter (Signed)
Noted.  Discharged to SNF.  Wife to call and schedule hospital follow up appointment once he is discharged home.  Transitional care management to be completed at that time.

## 2016-06-19 ENCOUNTER — Inpatient Hospital Stay: Payer: Medicare Other

## 2016-06-19 ENCOUNTER — Inpatient Hospital Stay
Admission: EM | Admit: 2016-06-19 | Discharge: 2016-06-23 | DRG: 871 | Disposition: A | Discharge status: Hospice | Payer: Medicare Other | Attending: Internal Medicine | Admitting: Internal Medicine

## 2016-06-19 ENCOUNTER — Encounter: Payer: Self-pay | Admitting: *Deleted

## 2016-06-19 ENCOUNTER — Ambulatory Visit: Payer: BLUE CROSS/BLUE SHIELD | Admitting: Family Medicine

## 2016-06-19 ENCOUNTER — Emergency Department: Payer: Medicare Other

## 2016-06-19 DIAGNOSIS — N486 Induration penis plastica: Secondary | ICD-10-CM

## 2016-06-19 DIAGNOSIS — E872 Acidosis, unspecified: Secondary | ICD-10-CM

## 2016-06-19 DIAGNOSIS — I2510 Atherosclerotic heart disease of native coronary artery without angina pectoris: Secondary | ICD-10-CM | POA: Diagnosis present

## 2016-06-19 DIAGNOSIS — N400 Enlarged prostate without lower urinary tract symptoms: Secondary | ICD-10-CM | POA: Diagnosis present

## 2016-06-19 DIAGNOSIS — Z515 Encounter for palliative care: Secondary | ICD-10-CM | POA: Insufficient documentation

## 2016-06-19 DIAGNOSIS — R6521 Severe sepsis with septic shock: Secondary | ICD-10-CM | POA: Diagnosis present

## 2016-06-19 DIAGNOSIS — C7911 Secondary malignant neoplasm of bladder: Secondary | ICD-10-CM | POA: Diagnosis present

## 2016-06-19 DIAGNOSIS — R4182 Altered mental status, unspecified: Secondary | ICD-10-CM | POA: Diagnosis present

## 2016-06-19 DIAGNOSIS — J9601 Acute respiratory failure with hypoxia: Secondary | ICD-10-CM | POA: Diagnosis not present

## 2016-06-19 DIAGNOSIS — Z79899 Other long term (current) drug therapy: Secondary | ICD-10-CM | POA: Diagnosis not present

## 2016-06-19 DIAGNOSIS — I110 Hypertensive heart disease with heart failure: Secondary | ICD-10-CM | POA: Diagnosis present

## 2016-06-19 DIAGNOSIS — C439 Malignant melanoma of skin, unspecified: Secondary | ICD-10-CM | POA: Diagnosis present

## 2016-06-19 DIAGNOSIS — E785 Hyperlipidemia, unspecified: Secondary | ICD-10-CM | POA: Diagnosis present

## 2016-06-19 DIAGNOSIS — E875 Hyperkalemia: Secondary | ICD-10-CM

## 2016-06-19 DIAGNOSIS — G250 Essential tremor: Secondary | ICD-10-CM | POA: Diagnosis present

## 2016-06-19 DIAGNOSIS — R69 Illness, unspecified: Secondary | ICD-10-CM

## 2016-06-19 DIAGNOSIS — D696 Thrombocytopenia, unspecified: Secondary | ICD-10-CM | POA: Diagnosis present

## 2016-06-19 DIAGNOSIS — A419 Sepsis, unspecified organism: Secondary | ICD-10-CM | POA: Diagnosis present

## 2016-06-19 DIAGNOSIS — N390 Urinary tract infection, site not specified: Secondary | ICD-10-CM | POA: Diagnosis not present

## 2016-06-19 DIAGNOSIS — N179 Acute kidney failure, unspecified: Secondary | ICD-10-CM

## 2016-06-19 DIAGNOSIS — I959 Hypotension, unspecified: Secondary | ICD-10-CM

## 2016-06-19 DIAGNOSIS — N10 Acute tubulo-interstitial nephritis: Secondary | ICD-10-CM | POA: Diagnosis present

## 2016-06-19 DIAGNOSIS — N323 Diverticulum of bladder: Secondary | ICD-10-CM

## 2016-06-19 DIAGNOSIS — N170 Acute kidney failure with tubular necrosis: Secondary | ICD-10-CM | POA: Diagnosis not present

## 2016-06-19 DIAGNOSIS — L89622 Pressure ulcer of left heel, stage 2: Secondary | ICD-10-CM | POA: Diagnosis present

## 2016-06-19 DIAGNOSIS — R601 Generalized edema: Secondary | ICD-10-CM

## 2016-06-19 DIAGNOSIS — E119 Type 2 diabetes mellitus without complications: Secondary | ICD-10-CM | POA: Diagnosis present

## 2016-06-19 DIAGNOSIS — D649 Anemia, unspecified: Secondary | ICD-10-CM | POA: Diagnosis present

## 2016-06-19 DIAGNOSIS — L8990 Pressure ulcer of unspecified site, unspecified stage: Secondary | ICD-10-CM | POA: Insufficient documentation

## 2016-06-19 DIAGNOSIS — I5031 Acute diastolic (congestive) heart failure: Secondary | ICD-10-CM | POA: Diagnosis present

## 2016-06-19 DIAGNOSIS — J9600 Acute respiratory failure, unspecified whether with hypoxia or hypercapnia: Secondary | ICD-10-CM | POA: Diagnosis present

## 2016-06-19 DIAGNOSIS — R740 Nonspecific elevation of levels of transaminase and lactic acid dehydrogenase [LDH]: Secondary | ICD-10-CM

## 2016-06-19 DIAGNOSIS — C779 Secondary and unspecified malignant neoplasm of lymph node, unspecified: Secondary | ICD-10-CM | POA: Diagnosis present

## 2016-06-19 DIAGNOSIS — I509 Heart failure, unspecified: Secondary | ICD-10-CM

## 2016-06-19 DIAGNOSIS — E871 Hypo-osmolality and hyponatremia: Secondary | ICD-10-CM | POA: Diagnosis present

## 2016-06-19 DIAGNOSIS — Z951 Presence of aortocoronary bypass graft: Secondary | ICD-10-CM

## 2016-06-19 DIAGNOSIS — C7951 Secondary malignant neoplasm of bone: Secondary | ICD-10-CM

## 2016-06-19 DIAGNOSIS — I5021 Acute systolic (congestive) heart failure: Secondary | ICD-10-CM | POA: Diagnosis not present

## 2016-06-19 DIAGNOSIS — Z794 Long term (current) use of insulin: Secondary | ICD-10-CM | POA: Diagnosis not present

## 2016-06-19 DIAGNOSIS — N419 Inflammatory disease of prostate, unspecified: Secondary | ICD-10-CM

## 2016-06-19 DIAGNOSIS — Z66 Do not resuscitate: Secondary | ICD-10-CM | POA: Diagnosis present

## 2016-06-19 LAB — COMPREHENSIVE METABOLIC PANEL
ALBUMIN: 2 g/dL — AB (ref 3.5–5.0)
ALK PHOS: 283 U/L — AB (ref 38–126)
ALT: 32 U/L (ref 17–63)
ANION GAP: 12 (ref 5–15)
AST: 103 U/L — AB (ref 15–41)
BILIRUBIN TOTAL: 1.3 mg/dL — AB (ref 0.3–1.2)
BUN: 91 mg/dL — AB (ref 6–20)
CALCIUM: 8 mg/dL — AB (ref 8.9–10.3)
CO2: 20 mmol/L — ABNORMAL LOW (ref 22–32)
CREATININE: 1.84 mg/dL — AB (ref 0.61–1.24)
Chloride: 97 mmol/L — ABNORMAL LOW (ref 101–111)
GFR calc Af Amer: 40 mL/min — ABNORMAL LOW (ref >60)
GFR calc non Af Amer: 34 mL/min — ABNORMAL LOW (ref >60)
GLUCOSE: 230 mg/dL — AB (ref 65–99)
Potassium: 5.5 mmol/L — ABNORMAL HIGH (ref 3.5–5.1)
Sodium: 129 mmol/L — ABNORMAL LOW (ref 135–145)
TOTAL PROTEIN: 4.5 g/dL — AB (ref 6.5–8.1)

## 2016-06-19 LAB — CBC WITH DIFFERENTIAL/PLATELET
BAND NEUTROPHILS: 27 %
BASOS ABS: 0 10*3/uL (ref 0–0.1)
BASOS PCT: 0 %
BLASTS: 0 %
EOS ABS: 0.3 10*3/uL (ref 0–0.7)
Eosinophils Relative: 3 %
HEMATOCRIT: 23.3 % — AB (ref 40.0–52.0)
HEMOGLOBIN: 7.5 g/dL — AB (ref 13.0–18.0)
Lymphocytes Relative: 19 %
Lymphs Abs: 1.9 10*3/uL (ref 1.0–3.6)
MCH: 27.9 pg (ref 26.0–34.0)
MCHC: 32.3 g/dL (ref 32.0–36.0)
MCV: 86.6 fL (ref 80.0–100.0)
METAMYELOCYTES PCT: 10 %
MONO ABS: 0.5 10*3/uL (ref 0.2–1.0)
MYELOCYTES: 0 %
Monocytes Relative: 5 %
Neutro Abs: 7.5 10*3/uL — ABNORMAL HIGH (ref 1.4–6.5)
Neutrophils Relative %: 36 %
Other: 0 %
PROMYELOCYTES ABS: 0 %
Platelets: 19 10*3/uL — CL (ref 150–440)
RBC: 2.69 MIL/uL — ABNORMAL LOW (ref 4.40–5.90)
RDW: 18.9 % — ABNORMAL HIGH (ref 11.5–14.5)
WBC: 10.2 10*3/uL (ref 3.8–10.6)
nRBC: 18 /100 WBC — ABNORMAL HIGH

## 2016-06-19 LAB — BLOOD GAS, VENOUS
ACID-BASE DEFICIT: 5.9 mmol/L — AB (ref 0.0–2.0)
BICARBONATE: 19.5 meq/L — AB (ref 21.0–28.0)
PATIENT TEMPERATURE: 37
PCO2 VEN: 37 mmHg — AB (ref 44.0–60.0)
PH VEN: 7.33 (ref 7.320–7.430)
pO2, Ven: <31 mmHg — ABNORMAL LOW (ref 31.0–45.0)

## 2016-06-19 LAB — URINALYSIS COMPLETE WITH MICROSCOPIC (ARMC ONLY)
Bilirubin Urine: NEGATIVE
Glucose, UA: 50 mg/dL — AB
Ketones, ur: NEGATIVE mg/dL
Nitrite: NEGATIVE
PH: 5 (ref 5.0–8.0)
PROTEIN: NEGATIVE mg/dL
Specific Gravity, Urine: 1.014 (ref 1.005–1.030)

## 2016-06-19 LAB — TROPONIN I: Troponin I: <0.03 ng/mL (ref <0.03)

## 2016-06-19 LAB — GLUCOSE, CAPILLARY: GLUCOSE-CAPILLARY: 218 mg/dL — AB (ref 65–99)

## 2016-06-19 LAB — LACTIC ACID, PLASMA
LACTIC ACID, VENOUS: 5.8 mmol/L — AB (ref 0.5–1.9)
LACTIC ACID, VENOUS: 4.7 mmol/L — AB (ref 0.5–1.9)

## 2016-06-19 LAB — PREPARE RBC (CROSSMATCH)

## 2016-06-19 LAB — BRAIN NATRIURETIC PEPTIDE: B NATRIURETIC PEPTIDE 5: 80 pg/mL (ref 0.0–100.0)

## 2016-06-19 MED ORDER — INSULIN ASPART 100 UNIT/ML ~~LOC~~ SOLN
0.0000 [IU] | Freq: Three times a day (TID) | SUBCUTANEOUS | Status: DC
  Administered 2016-06-19: 3 [IU] via SUBCUTANEOUS
  Administered 2016-06-20: 5 [IU] via SUBCUTANEOUS
  Administered 2016-06-20: 3 [IU] via SUBCUTANEOUS
  Administered 2016-06-20: 2 [IU] via SUBCUTANEOUS
  Administered 2016-06-21: 5 [IU] via SUBCUTANEOUS
  Filled 2016-06-19: qty 5
  Filled 2016-06-19 (×3): qty 3
  Filled 2016-06-19: qty 5

## 2016-06-19 MED ORDER — CEFEPIME HCL 2 G IJ SOLR
2.0000 g | Freq: Once | INTRAMUSCULAR | Status: AC
  Administered 2016-06-19: 2 g via INTRAVENOUS
  Filled 2016-06-19: qty 2

## 2016-06-19 MED ORDER — DEXTROSE 5 % IV SOLN
INTRAVENOUS | Status: DC
  Administered 2016-06-19 – 2016-06-21 (×4): via INTRAVENOUS
  Filled 2016-06-19 (×10): qty 1000

## 2016-06-19 MED ORDER — SODIUM CHLORIDE 0.9 % IV SOLN
250.0000 mL | INTRAVENOUS | Status: DC | PRN
  Administered 2016-06-19: 250 mL via INTRAVENOUS

## 2016-06-19 MED ORDER — SODIUM CHLORIDE 0.9 % IV SOLN: Freq: Once | INTRAVENOUS | Status: DC

## 2016-06-19 MED ORDER — DEXTROSE 5 % IV SOLN
0.0000 ug/min | INTRAVENOUS | Status: DC
  Administered 2016-06-19: 100 ug/min via INTRAVENOUS
  Administered 2016-06-19: 50 ug/min via INTRAVENOUS
  Filled 2016-06-19: qty 1

## 2016-06-19 MED ORDER — SODIUM BICARBONATE 8.4 % IV SOLN: INTRAVENOUS | Status: DC

## 2016-06-19 MED ORDER — FUROSEMIDE 10 MG/ML IJ SOLN
40.0000 mg | Freq: Two times a day (BID) | INTRAMUSCULAR | Status: DC
  Administered 2016-06-19 – 2016-06-21 (×4): 40 mg via INTRAVENOUS
  Filled 2016-06-19 (×4): qty 4

## 2016-06-19 MED ORDER — SODIUM CHLORIDE 0.9 % IV BOLUS (SEPSIS)
1000.0000 mL | Freq: Once | INTRAVENOUS | Status: AC
  Administered 2016-06-19: 1000 mL via INTRAVENOUS

## 2016-06-19 MED ORDER — PHENYLEPHRINE HCL 10 MG/ML IJ SOLN
0.0000 ug/min | INTRAVENOUS | Status: DC
  Administered 2016-06-19 – 2016-06-20 (×2): 100 ug/min via INTRAVENOUS
  Administered 2016-06-20: 70 ug/min via INTRAVENOUS
  Administered 2016-06-20: 80 ug/min via INTRAVENOUS
  Filled 2016-06-19 (×6): qty 4

## 2016-06-19 MED ORDER — VANCOMYCIN HCL IN DEXTROSE 1-5 GM/200ML-% IV SOLN
1000.0000 mg | Freq: Once | INTRAVENOUS | Status: AC
  Administered 2016-06-19: 1000 mg via INTRAVENOUS
  Filled 2016-06-19: qty 200

## 2016-06-19 MED ORDER — VANCOMYCIN HCL 10 G IV SOLR
1250.0000 mg | INTRAVENOUS | Status: DC
  Administered 2016-06-19: 1250 mg via INTRAVENOUS
  Filled 2016-06-19 (×2): qty 1250

## 2016-06-19 MED ORDER — SODIUM CHLORIDE 0.9% FLUSH
3.0000 mL | Freq: Two times a day (BID) | INTRAVENOUS | Status: DC
  Administered 2016-06-19 – 2016-06-23 (×6): 3 mL via INTRAVENOUS

## 2016-06-19 MED ORDER — ALBUMIN HUMAN 25 % IV SOLN
12.5000 g | Freq: Two times a day (BID) | INTRAVENOUS | Status: DC
  Administered 2016-06-19 – 2016-06-21 (×6): 12.5 g via INTRAVENOUS
  Filled 2016-06-19 (×8): qty 50

## 2016-06-19 MED ORDER — NOREPINEPHRINE BITARTRATE 1 MG/ML IV SOLN: 0.0000 ug/min | INTRAVENOUS | Status: DC

## 2016-06-19 MED ORDER — DEXTROSE 5 % IV SOLN
1.0000 g | Freq: Two times a day (BID) | INTRAVENOUS | Status: DC
  Administered 2016-06-19 – 2016-06-22 (×7): 1 g via INTRAVENOUS
  Filled 2016-06-19 (×8): qty 1

## 2016-06-19 MED ORDER — SODIUM CHLORIDE 0.9% FLUSH
3.0000 mL | INTRAVENOUS | Status: DC | PRN
  Administered 2016-06-19 – 2016-06-21 (×2): 3 mL via INTRAVENOUS
  Filled 2016-06-19 (×2): qty 3

## 2016-06-19 MED ORDER — SODIUM CHLORIDE 0.9% FLUSH
3.0000 mL | Freq: Two times a day (BID) | INTRAVENOUS | Status: DC
  Administered 2016-06-19 – 2016-06-22 (×7): 3 mL via INTRAVENOUS

## 2016-06-19 MED ORDER — NOREPINEPHRINE 4 MG/250ML-% IV SOLN
0.0000 ug/min | INTRAVENOUS | Status: DC
  Administered 2016-06-19: 5 ug/min via INTRAVENOUS
  Administered 2016-06-19: 2 ug/min via INTRAVENOUS
  Filled 2016-06-19: qty 250

## 2016-06-19 MED ORDER — ENOXAPARIN SODIUM 40 MG/0.4ML ~~LOC~~ SOLN: 40.0000 mg | SUBCUTANEOUS | Status: DC

## 2016-06-19 NOTE — Progress Notes (Signed)
1847 remains on Neo at 147mcg- sbp barely over 100. NAacetate in d5w at 10ml. No change in assessment.

## 2016-06-19 NOTE — ED Notes (Signed)
EDP at bedside, pt placed on RA

## 2016-06-19 NOTE — Consult Note (Signed)
[place] CONSULT NOTE  Patient Care Team: Coral Spikes, DO as PCP - General (Family Medicine)  CHIEF COMPLAINTS/PURPOSE OF CONSULTATION:  Metastatic melanoma  HISTORY OF PRESENTING ILLNESS:  [name] [name] 75 y.o.  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 Patient is currently being treated At [facility] with combination immunotherapy with ipi + Nivo; Status post #2 cycle on June 21st.  Patient was recently discharged from our hospital 4 days ago-to a nursing home.   He returns back with altered mental status; hypotension; acute renal failure creatinine 1.8; elevated lactic acid 5. He is currently on pressors in the ICU. Likely sepsis/UTI. On broad-spectrum antibiotics.  Nephrology has been consulted- for acidosis/acute renal failure; critical care is also been involved. Family has agreed to DO NOT RESUSCITATE/no aggressive interventions at this time.  ROS: Unable to assess as patient has altered mental status. Family not around.  MEDICAL HISTORY:  Past Medical History  Diagnosis Date  . Insulin dependent diabetes mellitus ([place])   . Arteriosclerotic coronary artery disease   . Peyronie's disease   . Prostatitis   . Benign essential tremor   . Hyperlipidemia   . Bladder diverticulum   . Melanoma ([name])   . Cancer ([place])     Stage 4 melanoma mets to bladder, kidneys, lymph nodes and possibly bone marrow per pt's spouse.    SURGICAL HISTORY: Past Surgical History  Procedure Laterality Date  . Cholecystectomy  1973  . Coronary artery bypass graft  04/2010     x6, Done at [place]: Social History   Social History  . Marital Status: Married    Spouse Name: N/A  . Number of Children: N/A  . Years of Education: 16   Occupational History  . Merchant    Social History Main Topics  . Smoking status: Never Smoker   . Smokeless tobacco: Never  Used  . Alcohol Use: Yes     Comment: 4OZ of wine qxnight.  . Drug Use: No  . Sexual Activity: Not on file   Other Topics Concern  . Not on file   Social History Narrative   Regular exercise-yes    FAMILY HISTORY: Family History  Problem Relation Age of Onset  . Arthritis Mother   . Heart disease Mother   . Heart attack Mother   . Diabetes Sister   . Heart disease Brother   . Diabetes Brother     ALLERGIES:  is allergic to ciprofloxacin and codeine.  MEDICATIONS:  Current Facility-Administered Medications  Medication Dose Route Frequency Provider Last Rate Last Dose  . 0.9 %  sodium chloride infusion  250 mL Intravenous PRN Theodoro Grist, MD 10 mL/hr at 06/19/16 1815 250 mL at 06/19/16 1815  . 0.9 %  sodium chloride infusion   Intravenous Once Theodoro Grist, MD      . albumin human 25 % solution 12.5 g  12.5 g Intravenous BID Theodoro Grist, MD   12.5 g at 06/19/16 1815  . ceFEPIme (MAXIPIME) 1 g in dextrose 5 % 50 mL IVPB  1 g Intravenous Q12H Sheema M Hallaji, RPH   1 g at 06/19/16 1726  . dextrose 5 % 1,000 mL with sodium acetate 150 mEq infusion   Intravenous Continuous Lavonia Dana, MD 75 mL/hr at 06/19/16 1757    . furosemide (LASIX) injection 40 mg  40 mg Intravenous BID Theodoro Grist,  MD   40 mg at 06/19/16 1825  . insulin aspart (novoLOG) injection 0-9 Units  0-9 Units Subcutaneous TID WC Theodoro Grist, MD   3 Units at 06/19/16 1639  . phenylephrine (NEO-SYNEPHRINE) 40 mg in dextrose 5 % 250 mL (0.16 mg/mL) infusion  0-400 mcg/min Intravenous Titrated Theodoro Grist, MD 37.5 mL/hr at 06/19/16 1819 100 mcg/min at 06/19/16 1819  . sodium chloride flush (NS) 0.9 % injection 3 mL  3 mL Intravenous Q12H Theodoro Grist, MD   3 mL at 06/19/16 1827  . sodium chloride flush (NS) 0.9 % injection 3 mL  3 mL Intravenous Q12H Theodoro Grist, MD   3 mL at 06/19/16 1817  . sodium chloride flush (NS) 0.9 % injection 3 mL  3 mL Intravenous PRN Theodoro Grist, MD   3 mL at 06/19/16 1817   . vancomycin (VANCOCIN) 1,250 mg in sodium chloride 0.9 % 250 mL IVPB  1,250 mg Intravenous Q24H Sheema M Hallaji, RPH          .  PHYSICAL EXAMINATION:   Filed Vitals:   06/19/16 1800 06/19/16 1815  BP: 109/65 106/45  Pulse: 90 90  Temp:    Resp: 26 36   Filed Weights   06/19/16 1212  Weight: 201 lb (91.173 kg)    GENERAL: Moderately nourished; Poorly nourished Caucasian male patient. He is alone. EYES: Positive for pallor. OROPHARYNX: no thrush or ulceration. NECK: supple, no masses felt LYMPH: no palpable lymphadenopathy in the cervical, axillary or inguinal regions LUNGS: decreased breath sounds to auscultation at bases and No wheeze or crackles HEART/CVS: regular rate & rhythm and no murmurs; No lower extremity edema ABDOMEN: abdomen soft, non-tender and normal bowel sounds Musculoskeletal:no cyanosis of digits and no clubbing  PSYCH: Drowsy; arousable.  NEURO: no focal motor/sensory deficits SKIN: Multiple 1 cm sized "moles " noted on the chest  LABORATORY DATA:  I have reviewed the data as listed Lab Results  Component Value Date   WBC 10.2 06/19/2016   HGB 7.5* 06/19/2016   HCT 23.3* 06/19/2016   MCV 86.6 06/19/2016   PLT 19* 06/19/2016    Recent Labs  05/09/16 1019  06/11/16 1036 06/12/16 0409 06/14/16 0447 06/19/16 1232  NA  --   < > 130* 131* 130* 129*  K  --   < > 4.1 4.1 4.4 5.5*  CL  --   < > 96* 98* 97* 97*  CO2  --   < > 26 26 23  20*  GLUCOSE  --   < > 217* 203* 145* 230*  BUN  --   < > 38* 36* 35* 91*  CREATININE  --   < > 0.84 0.71 0.84 1.84*  CALCIUM  --   < > 7.6* 7.5* 8.1* 8.0*  GFRNONAA  --   < > >60 >60 >60 34*  GFRAA  --   < > >60 >60 >60 40*  PROT 4.8*  < > 4.8* 4.6*  --  4.5*  ALBUMIN 1.8*  < > 1.9* 1.9*  --  2.0*  AST 105*  < > 43* 46*  --  103*  ALT 62  < > 16* 16*  --  32  ALKPHOS 191*  < > 233* 248*  --  283*  BILITOT 0.5  < > 1.1 1.7*  --  1.3*  BILIDIR 0.2  --   --   --   --   --   IBILI 0.3  --   --   --   --    --   < > =  values in this interval not displayed.  RADIOGRAPHIC STUDIES: I have personally reviewed the radiological images as listed and agreed with the findings in the report. Dg Chest 2 View  06/14/2016  CLINICAL DATA:  Pneumonia, history of diabetes, coronary artery disease, nonsmoker. EXAM: CHEST  2 VIEW COMPARISON:  Chest x-ray of June 12, 2016 FINDINGS: The exam is performed in a lordotic projection. The lungs remain mildly hypoinflated. Bibasilar densities are present consistent with atelectasis and pleural effusions. The heart is normal in size. The pulmonary vascularity is not engorged. The patient has undergone previous CABG. The bony thorax exhibits no acute abnormality. There is chronic deformity of the lateral aspect of the left fifth rib. IMPRESSION: Small bilateral pleural effusions. Bibasilar atelectasis or pneumonia greater on the left. No significant pulmonary edema. Electronically Signed   By: David  Martinique M.D.   On: 06/14/2016 07:35   Dg Chest 2 View  06/12/2016  CLINICAL DATA:  Follow-up bilateral pleural effusions. EXAM: CHEST  2 VIEW COMPARISON:  06/11/2016. FINDINGS: Prior CABG. Bibasilar subsegmental atelectasis. Mild left lower lobe infiltrate. Small left pleural effusion again noted. Tiny right pleural effusion cannot be excluded . No pneumothorax. IMPRESSION: 1. Prior CABG. 2. Bibasilar subsegmental atelectasis. Mild left lower lobe infiltrate and small left pleural effusion. Tiny right effusion cannot be entirely excluded . No significant change from prior exam . Electronically Signed   By: Marcello Moores  Register   On: 06/12/2016 08:11   Dg Chest 2 View  06/11/2016  CLINICAL DATA:  Shortness of Breath EXAM: CHEST  2 VIEW COMPARISON:  06/07/2016 FINDINGS: Cardiac shadow is within normal limits. Postoperative changes are noted. Increasing left-sided pleural effusion is noted with likely underlying atelectasis. A smaller right pleural effusion is noted as well. IMPRESSION: New  effusions bilaterally left greater than right with associated left basilar atelectasis. Electronically Signed   By: Inez Catalina M.D.   On: 06/11/2016 12:14   Dg Chest 2 View  06/07/2016  CLINICAL DATA:  Left-sided thoracentesis EXAM: CHEST  2 VIEW COMPARISON:  Yesterday FINDINGS: Diminished left pleural effusion status post thoracentesis. No re-expansion edema or pneumothorax. Mild left basilar atelectasis. Mild residual pleural fluid or thickening. Normal heart size and aortic contours.  Status post CABG. Known sclerotic osseous metastases are not well visualized. IMPRESSION: 1. No acute finding after left thoracentesis. 2. Left basilar atelectasis. Electronically Signed   By: Monte Fantasia M.D.   On: 06/07/2016 12:58   Dg Chest 2 View  06/06/2016  CLINICAL DATA:  Weakness, shortness of Breath, metastatic melanoma EXAM: CHEST  2 VIEW COMPARISON:  05/07/2016 FINDINGS: Cardiomediastinal silhouette is stable. Status post median sternotomy. Linear atelectasis or scarring noted right lower lobe perihilar. There is small left pleural effusion left basilar atelectasis or infiltrate. No pulmonary edema. IMPRESSION: Status post CABG. Small left pleural effusion with left basilar atelectasis or infiltrate. Electronically Signed   By: Lahoma Crocker M.D.   On: 06/06/2016 16:40   Dg Lumbar Spine Complete  06/07/2016  CLINICAL DATA:  Low back pain EXAM: LUMBAR SPINE - COMPLETE 4+ VIEW COMPARISON:  04/29/2014 FINDINGS: Scattered large and small bowel gas is noted. Fecal material is noted throughout the colon. A 12 mm triangular shaped density is noted in the lower pole of the right kidney consistent with a nonobstructing stone. There is fullness of the collecting system and ureter on the left suspicious for a distal ureteral calculus. Significant trabeculation of the bladder is noted. Multilevel degenerative changes are seen with osteophytic change and disc  space narrowing. Facet hypertrophic changes are noted. No  compression deformities are seen. Diffuse aortic calcifications are noted IMPRESSION: Increase in size in the right lower pole renal calculus. Changes suggestive of at least partial ureteral obstruction on the left. Further evaluation is recommended. This may contribute to the patient's underlying back pain. Degenerative changes of the lumbar spine without acute abnormality. Increased trabeculation of the bladder which may be related to bladder outlet obstruction. Electronically Signed   By: Inez Catalina M.D.   On: 06/07/2016 09:41   Ct Angio Chest Pe W/cm &/or Wo Cm  06/06/2016  CLINICAL DATA:  Coughing and shortness of breath. Evaluate for pulmonary embolus. EXAM: CT ANGIOGRAPHY CHEST WITH CONTRAST TECHNIQUE: Multidetector CT imaging of the chest was performed using the standard protocol during bolus administration of intravenous contrast. Multiplanar CT image reconstructions and MIPs were obtained to evaluate the vascular anatomy. CONTRAST:  100 mL of Isovue 370 COMPARISON:  CT scan of the chest January 11, 2016 and chest x-ray from today FINDINGS: The central airways are normal. No pneumothorax. There is a small right pleural effusion and moderate size left effusion, both with associated compressive atelectasis. No pulmonary nodules, masses, or other focal infiltrates. Evaluation for pulmonary emboli is limited in the lower lobes due to compressive atelectasis and effusions. Within this limitation, no pulmonary emboli are identified. There are coronary artery calcifications. The thoracic aorta is non aneurysmal with no dissection. The heart size is unchanged. No pericardial effusion. There is adenopathy in the chest and abdomen. The adenopathy is diffuse. For example, there is a node just deep to the left pectoralis muscle on series 4, image 14 measuring 18 x 18 mm. Nodes in the left axilla and elsewhere in the left retro pectoral region are prominent but not enlarged by CT criteria. There is also a node at  the midline of the base of neck on series 4, image 6 which is larger in the interval. Enlarging prevascular nodes are seen. Numerous newly enlarged nodes are seen in the epicardial fat best seen on series 4, images 112-115. A representative node on image 113 to the left of midline measures 13 by 16 mm. Numerous enlarged nodes are seen in the upper abdomen as well. A representative node in the fat superior to the right kidney on series 4, image 136 measures 23 by 19 mm. Visualized portions of the adrenal glands, abdominal aorta, stomach, and liver are normal. The spleen is enlarged and heterogeneous but only partially visualized. Increased attenuation is seen in the subcutaneous fat diffusely suggesting volume overload/ edema. Bilateral gynecomastia is noted. There is increased patchy attenuation in the bones, best seen in the spine. The ribs and inferior sternum appear to be involved as well. Review of the MIP images confirms the above findings. IMPRESSION: 1. No pulmonary emboli are identified. 2. Diffuse adenopathy in the chest and upper abdomen in addition to scattered sclerosis throughout the visualized bones is most consistent with metastatic disease. The patient has a history of melanoma and prostate cancer. 3. Bilateral pleural effusions and atelectasis. 4. The spleen remains enlarged and is heterogeneous. The heterogeneity may be at least partially due to see arterial phase imaging. Electronically Signed   By: Dorise Bullion III M.D   On: 06/06/2016 17:22   Mr Brain W Wo Contrast  06/12/2016  CLINICAL DATA:  Confusion.  History of melanoma. EXAM: MRI HEAD WITHOUT AND WITH CONTRAST TECHNIQUE: Multiplanar, multiecho pulse sequences of the brain and surrounding structures were obtained without and  with intravenous contrast. CONTRAST:  57mL MULTIHANCE GADOBENATE DIMEGLUMINE 529 MG/ML IV SOLN COMPARISON:  Head CT 05/06/2016 FINDINGS: Calvarium and upper cervical spine: Heterogeneous enhancement throughout the  central skullbase and upper cervical spine consistent with metastatic disease. Orbits: Negative. Sinuses and Mastoids: Completely opacified left maxillary antrum with mild atelectasis, consistent with chronic obstruction. No nasal cavity mass is seen. Brain: No evidence of brain metastasis. No acute infarct, hemorrhage, hydrocephalus, or mass lesion. No evidence of large vessel occlusion. Moderate generalized atrophy. IMPRESSION: 1. No acute finding or brain metastasis. 2. Known osseous metastatic disease. 3. Moderate atrophy. 4. Chronically obstructed left maxillary sinus. Electronically Signed   By: Monte Fantasia M.D.   On: 06/12/2016 15:37   US Renal  06/19/2016  CLINICAL DATA:  Acute renal failure. EXAM: RENAL / URINARY TRACT ULTRASOUND COMPLETE COMPARISON:  Abdomen and pelvis CT dated 10/12/2006. FINDINGS: Right Kidney: Length: 10.4 cm. Borderline increased echogenicity. Mildly dilated collecting system. Left Kidney: Length: 11.3 cm. Borderline increased echogenicity. Mildly dilated collecting system. Bladder: 1.4 cm rounded mass in the posterior aspect of the urinary bladder on the left. There is also mild posterior bladder wall thickening and irregularity at that location. The previously seen that probable bladder diverticula are smaller and not as well visualized today. The urinary bladder is distended and the patient was unable to empty his bladder during the examination. IMPRESSION: 1. Distended urinary bladder with associated mild bilateral hydronephrosis. 2. Borderline increased echogenicity of both kidneys. 3. 1.4 cm rounded mass in the posterior aspect of the urinary bladder on the left with mild bladder wall thickening and irregularity in that area. Differential considerations include polyp and malignancy. Electronically Signed   By: Claudie Revering M.D.   On: 06/19/2016 17:17   Dg Chest Port 1 View  06/19/2016  CLINICAL DATA:  Altered mental status. Metastatic melanoma. Decreased oxygen  saturation EXAM: PORTABLE CHEST 1 VIEW COMPARISON:  June 14, 2016 FINDINGS: There are bilateral pleural effusions, larger on the left than on the right. There is atelectasis in the right mid lung. Heart is upper normal in size with pulmonary vascularity within normal limits. No adenopathy evident.  No bone lesions. IMPRESSION: Bilateral pleural effusions. Suspect a degree of congestive heart failure. No airspace consolidation. There is right midlung atelectasis. Stable cardiac silhouette. Electronically Signed   By: Lowella Grip III M.D.   On: 06/19/2016 12:50   US Thoracentesis Asp Pleural Space W/img Guide  06/07/2016  INDICATION: Left pleural effusion. EXAM: ULTRASOUND GUIDED left THORACENTESIS MEDICATIONS: None. COMPLICATIONS: None immediate. PROCEDURE: An ultrasound guided thoracentesis was thoroughly discussed with the patient and questions answered. The benefits, risks, alternatives and complications were also discussed. The patient understands and wishes to proceed with the procedure. Written consent was obtained. Ultrasound was performed to localize and mark an adequate pocket of fluid in the left chest. The area was then prepped and draped in the normal sterile fashion. 1% Lidocaine was used for local anesthesia. Under ultrasound guidance a Safe-T-Centesis catheter was introduced. Thoracentesis was performed. The catheter was removed and a dressing applied. FINDINGS: A total of approximately 900 mL of serous fluid was removed. Samples were sent to the laboratory as requested by the clinical team. IMPRESSION: Successful ultrasound guided left thoracentesis yielding 900 mL of pleural fluid. Electronically Signed   By: Marijo Conception, M.D.   On: 06/07/2016 13:10    ASSESSMENT & PLAN:   74 year old male patient with metastatic melanoma to the bladder/ bone/ metastases Currently on Combination immunotherapy is  currently admitted to the hospital foraltered mental status.  # Altered mental  status-likely deteriorate with sepsis- with multiorgan dysfunction- acute renal failure/respiratory failure/shock from sepsis. Currently on antibiotics pressors.    # Likely progressive metastatic melanoma- currently on immunotherapy.  # Long discussion with the patient's son over the phone; regarding the patient's declining clinical status/and my recommendation not to be very aggressive at this time- as the underlying melanoma cannot be treated. Also discussed that multiorgan dysfunction is likely from sepsis/infection. Unfortunately treating infection itself- most likely not reverse the multiorgan dysfunction/especially the context of progressive melanoma.  Discussed with him that the goal of  further treatment to avoid any more discomfort.He seems to understand. For now continue current care. Await palliative care evaluation in the morning.    # I had also spoken multiple times to Dr. Raynelle Chary patient's primary oncologist at [facility] - who had also been recommending hospice for the patient.   Thank you Dr.Vaickute for allowing me to participate in the care of your pleasant patient. Please do not hesitate to contact me with questions or concerns in the interim.     Cammie Sickle, MD 06/19/2016 6:47 PM

## 2016-06-19 NOTE — ED Notes (Signed)
MD at bedside. 

## 2016-06-19 NOTE — NC FL2 (Signed)
[facility] LEVEL OF CARE SCREENING TOOL     IDENTIFICATION  Patient Name: [name] [name] Birthdate: 06-19-1941 Sex: male Admission Date (Current Location): 06/19/2016  [place] and [place] Number:  Engineering geologist and Address:  [facility], [address] [place], [place] [address]      Provider Number: [id number]  Attending Physician Name and Address:  Theodoro Grist, MD  Relative Name and Phone Number:       Current Level of Care: Hospital Recommended Level of Care: [place] Prior Approval Number:    Date Approved/Denied:   PASRR Number: [id number]  Discharge Plan: SNF    Current Diagnoses: Patient Active Problem List   Diagnosis Date Noted  . Anasarca 06/19/2016  . Acute CHF (congestive heart failure) ([place]) 06/19/2016  . ARF (acute renal failure) ([place]) 06/19/2016  . Hypotension 06/19/2016  . Acute pyelonephritis 06/19/2016  . Sepsis ([place]) 06/19/2016  . Hyperkalemia 06/19/2016  . Lactic acidosis 06/19/2016  . Pleural effusion, bilateral 06/11/2016  . Symptomatic anemia 06/11/2016  . Hypoxia 06/06/2016  . UTI (lower urinary tract infection) 05/07/2016  . Weakness 05/07/2016  . Melanoma ([place]) 05/07/2016  . Diabetes mellitus ([place]) 05/07/2016  . Metastatic melanoma ([place]) 04/25/2016  . Prostate cancer ([place]) 01/12/2016  . Venous insufficiency of leg 09/23/2015  . CAD (coronary artery disease) 06/30/2015  . Essential hypertension 06/30/2015  . Essential tremor 06/30/2015  . Preventative health care 06/30/2015  . Carotid artery disease ([place]) 10/02/2014  . Hyperlipidemia   . Type 2 diabetes mellitus with complications (H[facility])     Orientation RESPIRATION BLADDER Height & Weight      (Fluctuating Orientation levels )  Normal Incontinent Weight: 201 lb (91.173 kg) Height:  6\' 1"  (185.4 cm)  BEHAVIORAL SYMPTOMS/MOOD NEUROLOGICAL BOWEL NUTRITION STATUS   (None)  (None) Incontinent Diet (Heart)  AMBULATORY  STATUS COMMUNICATION OF NEEDS Skin   Extensive Assist Verbally Normal                       Personal Care Assistance Level of Assistance  Bathing, Feeding, Dressing Bathing Assistance: Limited assistance Feeding assistance: Independent Dressing Assistance: Limited assistance     Functional Limitations Info  Sight, Hearing, Speech Sight Info: Adequate Hearing Info: Adequate Speech Info: Adequate    SPECIAL CARE FACTORS FREQUENCY                       Contractures      Additional Factors Info  Code Status, Allergies Code Status Info: Partial Code Allergies Info: Ciprofloxacin, Codeine           Current Medications (06/19/2016):  This is the current hospital active medication list Current Facility-Administered Medications  Medication Dose Route Frequency Provider Last Rate Last Dose  . 0.9 %  sodium chloride infusion   Intravenous Once Theodoro Grist, MD      . albumin human 25 % solution 12.5 g  12.5 g Intravenous BID Theodoro Grist, MD      . furosemide (LASIX) injection 40 mg  40 mg Intravenous BID Theodoro Grist, MD      . norepinephrine (LEVOPHED) 4mg  in D5W 275mL premix infusion  0-40 mcg/min Intravenous Titrated Theodoro Grist, MD 7.5 mL/hr at 06/19/16 1430 2 mcg/min at 06/19/16 1430   Current Outpatient Prescriptions  Medication Sig Dispense Refill  . azithromycin (ZITHROMAX) 250 MG tablet Take 1 tablet (250 mg total) by mouth daily. 1 each 0  . cholecalciferol (  VITAMIN D) 1000 UNITS tablet Take 1,000 Units by mouth daily.      Marland Kitchen docusate sodium (COLACE) 100 MG capsule Take 100 mg by mouth daily.    . furosemide (LASIX) 20 MG tablet Take one tab po daily as needed for swelling or shortness of breath 30 tablet 0  . insulin lispro protamine-lispro (HUMALOG MIX 75/25) (75-25) 100 UNIT/ML SUSP injection Inject 20-30 Units into the skin 2 (two) times daily with a meal. Pt uses per sliding scale.    . nitrofurantoin, macrocrystal-monohydrate, (MACROBID) 100 MG  capsule Take 100 mg by mouth daily.    . polyethylene glycol (MIRALAX / GLYCOLAX) packet Take 17 g by mouth daily as needed for mild constipation. 14 each 0  . primidone (MYSOLINE) 50 MG tablet Take 50 mg by mouth daily.     . tamsulosin (FLOMAX) 0.4 MG CAPS capsule Take 0.4 mg by mouth daily after breakfast.     . Turmeric 500 MG CAPS Take 500 mg by mouth daily.    . vitamin B-12 (CYANOCOBALAMIN) 1000 MCG tablet Take 1,000 mcg by mouth daily.       Discharge Medications: Please see discharge summary for a list of discharge medications.  Relevant Imaging Results:  Relevant Lab Results:   Additional Information SSN [id number]  Georga Kaufmann, LCSWA

## 2016-06-19 NOTE — H&P (Addendum)
[facility] NAME: [name] [name]    MR#:  [id number]  DATE OF BIRTH:  Oct 24, 1941  DATE OF ADMISSION:  06/19/2016  PRIMARY CARE PHYSICIAN: Coral Spikes, DO   REQUESTING/REFERRING PHYSICIAN:   CHIEF COMPLAINT:   Chief Complaint  Patient presents with  . Altered Mental Status    HISTORY OF PRESENT ILLNESS: [name] [name]  is a 75 y.o. male with a known history of Metastatic melanoma, who was recently admitted with pneumonia diagnosis and was discharged on 06/15/2016 to [place] facility comes back from the facility with hypotension and altered mental status, worsening somnolence. Patient himself denies any pain or significant shortness of breath, however, not able to review of systems due to his somnolence. On arrival to the hospital patient was noted to have hypotension, hyperkalemia, lactic acidosis, acute renal failure, anasarca, CHF on chest x-ray. Hospitalist services were contacted for admission had urinalysis revealed pyuria, concerning for urinary tract infection  PAST MEDICAL HISTORY:   Past Medical History  Diagnosis Date  . Insulin dependent diabetes mellitus ([place])   . Arteriosclerotic coronary artery disease   . Peyronie's disease   . Prostatitis   . Benign essential tremor   . Hyperlipidemia   . Bladder diverticulum   . Melanoma ([place])   . 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    PAST SURGICAL HISTORY: Past Surgical History  Procedure Laterality Date  . Cholecystectomy  1973  . 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 Smoking status: Never Smoker   . Smokeless tobacco: Never Used  . Alcohol Use: Yes     Comment: 4OZ of wine qxnight.    FAMILY HISTORY:  Family History  Problem Relation Age of Onset  . Arthritis Mother   . Heart disease Mother    . Heart attack Mother   . Diabetes Sister   . Heart disease Brother   . Diabetes Brother     DRUG ALLERGIES:  Allergies  Allergen Reactions  . Ciprofloxacin Nausea And Vomiting and Other (See Comments)    Reaction:  Shaking   . Codeine Other (See Comments)    Reaction:  Cramping     Review of Systems  Unable to perform ROS: medical condition    MEDICATIONS AT HOME:  Prior to Admission medications   Medication Sig Start Date End Date Taking? Authorizing Provider  azithromycin (ZITHROMAX) 250 MG tablet Take 1 tablet (250 mg total) by mouth daily. 06/15/16  Yes Demetrios Loll, MD  cholecalciferol (VITAMIN D) 1000 UNITS tablet Take 1,000 Units by mouth daily.     Yes Historical Provider, MD  docusate sodium (COLACE) 100 MG capsule Take 100 mg by mouth daily.   Yes Historical Provider, MD  furosemide (LASIX) 20 MG tablet Take one tab po daily as needed for swelling or shortness of breath 06/08/16  Yes Loletha Grayer, MD  insulin lispro protamine-lispro (HUMALOG MIX 75/25) (75-25) 100 UNIT/ML SUSP injection Inject 20-30 Units into the skin 2 (two) times daily with a meal. Pt uses per sliding scale.   Yes Historical Provider, MD  nitrofurantoin, macrocrystal-monohydrate, (MACROBID) 100 MG capsule Take 100 mg by mouth daily.   Yes Historical Provider, MD  polyethylene glycol (MIRALAX / GLYCOLAX) packet Take 17 g by mouth  daily as needed for mild constipation. 06/08/16  Yes Loletha Grayer, MD  primidone (MYSOLINE) 50 MG tablet Take 50 mg by mouth daily.    Yes Historical Provider, MD  tamsulosin (FLOMAX) 0.4 MG CAPS capsule Take 0.4 mg by mouth daily after breakfast.    Yes Historical Provider, MD  Turmeric 500 MG CAPS Take 500 mg by mouth daily.   Yes Historical Provider, MD  vitamin B-12 (CYANOCOBALAMIN) 1000 MCG tablet Take 1,000 mcg by mouth daily.   Yes Historical Provider, MD      PHYSICAL EXAMINATION:   VITAL SIGNS: Blood pressure 103/50, pulse 96, temperature 97.6 F (36.4 C),  temperature source Oral, resp. rate 22, height 6\' 1"  (1.854 m), weight 91.173 kg (201 lb), SpO2 96 %.  GENERAL:  75 y.o.-year-old patient lying in the bed In mild respiratory distress, breathing through the mouth, tachypneic.  EYES: Pupils equal, round, reactive to light and accommodation. No scleral icterus. Extraocular muscles intact.  HEENT: Head atraumatic, normocephalic. Oropharynx and nasopharynx clear. Lymphadenopathy submandibularly NECK:  Supple, no jugular venous distention. No thyroid enlargement, no tenderness.  LUNGS: Normal breath sounds bilaterally in upper lobes, decreased breath sounds bilaterally in the bases, no wheezing, few scattered rales,rhonchi and crepitations at bases. Intermittently using accessory muscles of respiration.  CARDIOVASCULAR: S1, S2 normal, rhythm is regular, tachycardic. No murmurs, rubs, or gallops.  ABDOMEN: Soft, nontender, nondistended. Bowel sounds present. No organomegaly or mass.  EXTREMITIES: 3+ lower extremity and pedal edema bilaterally, no cyanosis, or clubbing.  NEUROLOGIC: Cranial nerves II through XII are intact. Muscle strength not able to evaluate due to significant somnolence and poor cooperation. Sensation grossly intact. Gait not checked.  PSYCHIATRIC: The patient is somnolent, opens his eyes and briefly answers questions yes and no, then drifts back to sleep   SKIN: No obvious rash, lesion, or ulcer.   LABORATORY PANEL:   CBC  Recent Labs Lab 06/14/16 0631 06/15/16 0744  WBC 7.9 10.1  HGB 8.6* 8.8*  HCT 25.3* 26.4*  PLT 21* 23*  MCV 84.1 84.3  MCH 28.5 28.2  MCHC 33.9 33.4  RDW 17.2* 17.5*   ------------------------------------------------------------------------------------------------------------------  Chemistries   Recent Labs Lab 06/19/16 1232  NA 129*  K 5.5*  CL 97*  CO2 20*  GLUCOSE 230*  BUN 91*  CREATININE 1.84*  CALCIUM 8.0*  AST 103*  ALT 32  ALKPHOS 283*  BILITOT 1.3*    ------------------------------------------------------------------------------------------------------------------  Cardiac Enzymes  Recent Labs Lab 06/19/16 1232  TROPONINI <0.03   ------------------------------------------------------------------------------------------------------------------  RADIOLOGY: Dg Chest Port 1 View  06/19/2016  CLINICAL DATA:  Altered mental status. Metastatic melanoma. Decreased oxygen saturation EXAM: PORTABLE CHEST 1 VIEW COMPARISON:  June 14, 2016 FINDINGS: There are bilateral pleural effusions, larger on the left than on the right. There is atelectasis in the right mid lung. Heart is upper normal in size with pulmonary vascularity within normal limits. No adenopathy evident.  No bone lesions. IMPRESSION: Bilateral pleural effusions. Suspect a degree of congestive heart failure. No airspace consolidation. There is right midlung atelectasis. Stable cardiac silhouette. Electronically Signed   By: Lowella Grip III M.D.   On: 06/19/2016 12:50    EKG: Orders placed or performed during the hospital encounter of 06/19/16  . ED EKG 12-Lead  . ED EKG 12-Lead  EKG is pending  IMPRESSION AND PLAN:  Principal Problem:   Sepsis ([place]) Active Problems:   Acute CHF (congestive heart failure) ([place])   ARF (acute renal failure) (H[facility])   Anasarca  Hypotension   Acute pyelonephritis   Hyperkalemia   Lactic acidosis  #1. Sepsis, admitted to a medical floor, initiate patient on broad-spectrum antibiotic therapy after urinary, blood cultures were taken, follow culture results #2. Acute CHF, start patient on albumin/ low-dose of Lasix combination intravenously, follow ins and outs, place Foley catheter, get echocardiogram #3. Acute renal failure, please Foley catheter in, initiate patient on Levophed to improve blood pressure, albumin to improve oncotic pressure, get nephrologist involved for further recommendations, get renal ultrasound, follow creatinine in the  morning #4. Hyponatremia and fluid overloaded patient, patient may benefit from tolvaptan, getting nephrologist involved for recommendations, follow with diuretics #5. Hyperkalemia, follow with diuretics #6. Hypotension, possibly due to sepsis/intravascular depletion due to hypoalbuminemia, initiate Levophed keeping map at 65 and above #7. Lactic acidosis, follow lactic acid level closely #8 symptomatic anemia, transfuse patient with one unit of packed red blood cells, follow CBC, transfuse platelets if needed, get oncologist involved All the records are reviewed and case discussed with ED provider. Management plans discussed with the patient, family and they are in agreement.  CODE STATUS: Code Status History    Date Active Date Inactive Code Status Order ID Comments User Context   06/11/2016  5:32 PM 06/15/2016  5:52 PM Full Code [id number]  Idelle Crouch, MD Inpatient   06/06/2016  6:11 PM 06/08/2016  5:51 PM Full Code [id number]  Hillary Bow, MD ED   05/08/2016  1:03 AM 05/10/2016 10:53 AM Full Code [id number]  Quintella Baton, MD Inpatient       TOTAL Critical care TIME TAKING CARE OF THIS PATIENT: 80 minutes.  Patient is limited code, discussed this patient's family extensively, discussed with Dr. Dorris Singh M.D on 06/19/2016 at 2:21 PM  Between 7am to 6pm - Pager - [phone] After 6pm go to [email or web address] - password [email or web address]  Office  [phone]  CC: Primary care physician; Coral Spikes, DO

## 2016-06-19 NOTE — ED Notes (Addendum)
Pt arrives via EMS from [facility] with AMS over the past 48 hours, pt being tx for stage 4 melanoma, EMs reports o2 sat in the 80s, arrives on NRB, edema in lower extremitys

## 2016-06-19 NOTE — Consult Note (Signed)
PULMONARY / CRITICAL CARE MEDICINE   Name: [name] [name] MRN: [id number] DOB: Feb 26, 1941    ADMISSION DATE:  06/19/2016 CONSULTATION DATE:  06/19/2016  REFERRING MD:  Dr. Ether Griffins  CHIEF COMPLAINT: Altered mental statua  HISTORY OF PRESENT ILLNESS:   This is a 75 y.o male with PMH metastatic melanoma, prostatitis, DM, peyronie's disease, benign essential tremor, hyperlipidemia, and CAD who was recently admitted with a pneumonia diagnosis and discharged on 06/15/2016 to [facility] facility who presented to [facility] ER on 7/10 due to hypotension, altered mental status, and worsening somnolence.  On arrival to hospital it was noted he was hypotensive, hyperkalemia, lactic acidosis, acute renal failure, anasarca, CHF on CXR.  PCCM consulted 7/10 due to sepsis secondary to urinalysis revealing pyuria concerning for urinary tract infection and ?HCAP requiring pressors. Patient lethargic and unable to provide ROS  PAST MEDICAL HISTORY :  He  has a past medical history of Insulin dependent diabetes mellitus ([place]); Arteriosclerotic coronary artery disease; Peyronie's disease; Prostatitis; Benign essential tremor; Hyperlipidemia; Bladder diverticulum; Melanoma ([place]); and Cancer ([place]).  PAST SURGICAL HISTORY: He  has past surgical history that includes Cholecystectomy (1973) and Coronary artery bypass graft (04/2010).  Allergies  Allergen Reactions  . Ciprofloxacin Nausea And Vomiting and Other (See Comments)    Reaction:  Shaking   . Codeine Other (See Comments)    Reaction:  Cramping     No current facility-administered medications on file prior to encounter.   Current Outpatient Prescriptions on File Prior to Encounter  Medication Sig  . azithromycin (ZITHROMAX) 250 MG tablet Take 1 tablet (250 mg total) by mouth daily.  . cholecalciferol (VITAMIN D) 1000 UNITS tablet Take 1,000 Units by mouth daily.    Marland Kitchen docusate sodium (COLACE) 100 MG capsule Take 100 mg by mouth daily.  . furosemide  (LASIX) 20 MG tablet Take one tab po daily as needed for swelling or shortness of breath  . insulin lispro protamine-lispro (HUMALOG MIX 75/25) (75-25) 100 UNIT/ML SUSP injection Inject 20-30 Units into the skin 2 (two) times daily with a meal. Pt uses per sliding scale.  . nitrofurantoin, macrocrystal-monohydrate, (MACROBID) 100 MG capsule Take 100 mg by mouth daily.  . polyethylene glycol (MIRALAX / GLYCOLAX) packet Take 17 g by mouth daily as needed for mild constipation.  . primidone (MYSOLINE) 50 MG tablet Take 50 mg by mouth daily.   . tamsulosin (FLOMAX) 0.4 MG CAPS capsule Take 0.4 mg by mouth daily after breakfast.   . Turmeric 500 MG CAPS Take 500 mg by mouth daily.  . vitamin B-12 (CYANOCOBALAMIN) 1000 MCG tablet Take 1,000 mcg by mouth daily.    FAMILY HISTORY:  His indicated that his mother is deceased.   SOCIAL HISTORY: He  reports that he has never smoked. He has never used smokeless tobacco. He reports that he drinks alcohol. He reports that he does not use illicit drugs.  REVIEW OF SYSTEMS:   Unable to assess pt lethargic  SUBJECTIVE:  Pt currently lethargic able to follow very simple commands denies shortness of breath.  VITAL SIGNS: BP 82/53 mmHg  Pulse 90  Temp(Src) 97.6 F (36.4 C) (Oral)  Resp 18  Ht 6\' 1"  (1.854 m)  Wt 201 lb (91.173 kg)  BMI 26.52 kg/m2  SpO2 97%      PHYSICAL EXAMINATION: General:  Critically ill appearing male Neuro:  Lethargic follows commands  HEENT:  Supple, no JVD Cardiovascular:  s1s2, rrr, no M/R/G Lungs: rhonchi bilateral lower bases, diminished throughout, even,  non labored Abdomen:  Hypoactive BS x4, soft, non tender, non distended  Musculoskeletal:  Normal bulk and tone Skin:  Pale, buttocks reddened, stage II pressure ulcer left heel  LABS:  BMET  Recent Labs Lab 06/14/16 0447 06/19/16 1232  NA 130* 129*  K 4.4 5.5*  CL 97* 97*  CO2 23 20*  BUN 35* 91*  CREATININE 0.84 1.84*  GLUCOSE 145* 230*     Electrolytes  Recent Labs Lab 06/14/16 0447 06/19/16 1232  CALCIUM 8.1* 8.0*    CBC  Recent Labs Lab 06/14/16 0631 06/15/16 0744 06/19/16 1232  WBC 7.9 10.1 10.2  HGB 8.6* 8.8* 7.5*  HCT 25.3* 26.4* 23.3*  PLT 21* 23* 19*    Coag's No results for input(s): APTT, INR in the last 168 hours.  Sepsis Markers  Recent Labs Lab 06/19/16 1232  LATICACIDVEN 5.8*    ABG No results for input(s): PHART, PCO2ART, PO2ART in the last 168 hours.  Liver Enzymes  Recent Labs Lab 06/19/16 1232  AST 103*  ALT 32  ALKPHOS 283*  BILITOT 1.3*  ALBUMIN 2.0*    Cardiac Enzymes  Recent Labs Lab 06/19/16 1232  TROPONINI <0.03    Glucose  Recent Labs Lab 06/14/16 1646 06/14/16 2117 06/15/16 0743 06/15/16 1147 06/15/16 1416 06/19/16 1538  GLUCAP 144* 104* 73 83 104* 218*    Imaging Dg Chest Port 1 View  06/19/2016  CLINICAL DATA:  Altered mental status. Metastatic melanoma. Decreased oxygen saturation EXAM: PORTABLE CHEST 1 VIEW COMPARISON:  June 14, 2016 FINDINGS: There are bilateral pleural effusions, larger on the left than on the right. There is atelectasis in the right mid lung. Heart is upper normal in size with pulmonary vascularity within normal limits. No adenopathy evident.  No bone lesions. IMPRESSION: Bilateral pleural effusions. Suspect a degree of congestive heart failure. No airspace consolidation. There is right midlung atelectasis. Stable cardiac silhouette. Electronically Signed   By: Lowella Grip III M.D.   On: 06/19/2016 12:50     STUDIES:  None  CULTURES: Blood 7/10>> Urine 7/10>>  ANTIBIOTICS: Maxipime 7/10>> Vancomycin 7/10>>  SIGNIFICANT EVENTS: -Pt admitted to ICU on 7/10 due to septic shock secondary to UTI requiring pressors  LINES/TUBES: PIV x3  DISCUSSION: This is a 75 y.o. presented to [facility] on 7/10 on arrival he was hypotensive, hyperkalemia, lactic acidosis, acute renal failure, anasarca, CHF on CXR. PCCM  consulted 7/10 due to sepsis secondary to urinalysis revealing pyuria concerning for urinary tract infection and ?HCAP requiring pressors.  ASSESSMENT / PLAN:  PULMONARY A: CHF exacerbation P:   Continue lasix  Supplemental O2 as needed to maintain O2 sats 92% or above Pulmonary hygiene  CARDIOVASCULAR A: Hypotension Hx: CAD and Hyperlipidemia P:  Telemetry monitoring  Neo synephrine as needed to maintain map >65 via peripheral will not place central line due to platelet count of 19  RENAL A:   Acute renal failure Lactic Acidosis Hyperkalemia Hyponatremia  P:   Trend BMP's Monitor UOP  Nephrology consulted appreciate input Administer albumin and low dose lasix  As per Nephrology- family does NOT want HD  GASTROINTESTINAL A:   No acute issues P:   Keep NPO for now    HEMATOLOGIC A:   Anemia Thrombocytopenia Hx: Metastatic melanoma  P:  Transfuse 1 unit pRBC's managed per internal medicine physician Monitor for s/sx of bleeding Oncology consulted appreciate input  INFECTIOUS A:   Acute pyelonephritis  ?HCAP P:   Continue abx as listed above Trend WBC and  monitor fever curve Follow cultures Trend PCT's and lactic acid   ENDOCRINE A:   DM  P:   SSI Monitor CBG's q4hrs  NEUROLOGIC A:   Hx: Benign essential tremor  P:   RASS goal: N/A Avoid sedating medications Frequent reorientation  Lights on during day    FAMILY  - Updates: No family at bedside to update  - Inter-disciplinary family meet or Palliative Care meeting due by:  06/19/16  Marda Stalker, [place] Pager [phone] (please enter 7 digits) PCCM Consult Pager [phone] (please enter 7 digits)   STAFF NOTE: I, Dr. Corrin Parker,  have personally reviewed patient's available data, including medical history, events of note, physical examination and test results as part of my evaluation. I have discussed with NP and other care providers such as  pharmacist, RN and RRT.  In addition,  I personally evaluated patient and elicited key findings     A:severe acidosis and shock from sepsis from UTI  P: sepsis protocol, IVF's empiric abx Prognosis is poor. Will change to DNR status  biPAP if needed   The Rest per NP whose note is outlined above and that I agree with    The Patient requires high complexity decision making for assessment and support, frequent evaluation and titration of therapies, application of advanced monitoring technologies and extensive interpretation of multiple databases. Critical Care Time devoted to patient care services described in this note is 45 minutes.  This Critical care time does not reflrect procedure time or supervisory time of NP but could involve care discussion time Overall, patient is critically ill, prognosis is guarded.  Patient with Multiorgan failure and at high risk for cardiac arrest and death.  Patient is DNR/DNI.  Corrin Parker, M.D.  [facility]  Medical Director [place] Director [facility] Cardio-Pulmonary Department

## 2016-06-19 NOTE — ED Provider Notes (Signed)
[facility] Emergency Department Provider Note  ____________________________________________  Time seen: Approximately 1:35 PM  I have reviewed the triage vital signs and the nursing notes.   HISTORY  Chief Complaint Altered Mental Status   HPI [name] [name] is a 75 y.o. male a history of diabetes, stage IV melanoma who presents for evaluation of hypotension. Patient was found to be hypotensive at his facility by physical therapy earlier today. Unclear if patient has been having fevers at the facility. Patient reports generalized fatigue. History is limited due to patient's mental status and somnolence. According to the family, patient started to get worse yesterday with somnolence and less interactive. He was recently discharged from [facility] on 7/6 when he presented with pneumonia. He finished a course of azithromycin and Rocephin. According to the family patient hasn't been eating or drinking anything since discharge. Patient denies chest pain, shortness of breath, abdominal pain, nausea, vomiting, diarrhea, dysuria.  Past Medical History  Diagnosis Date  . Insulin dependent diabetes mellitus ([place])   . Arteriosclerotic coronary artery disease   . Peyronie's disease   . Prostatitis   . Benign essential tremor   . Hyperlipidemia   . Bladder diverticulum   . Melanoma ([place])   . 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    Patient Active Problem List   Diagnosis Date Noted  . Anasarca 06/19/2016  . Acute CHF (congestive heart failure) ([place]) 06/19/2016  . ARF (acute renal failure) ([place]) 06/19/2016  . Hypotension 06/19/2016  . Acute pyelonephritis 06/19/2016  . Sepsis ([place]) 06/19/2016  . Hyperkalemia 06/19/2016  . Lactic acidosis 06/19/2016  . Pressure ulcer 06/19/2016  . Pleural effusion, bilateral 06/11/2016  . Symptomatic anemia 06/11/2016  . Hypoxia 06/06/2016  . UTI (lower  urinary tract infection) 05/07/2016  . Weakness 05/07/2016  . Melanoma ([place]) 05/07/2016  . Diabetes mellitus ([place]) 05/07/2016  . Metastatic melanoma ([place]) 04/25/2016  . Prostate cancer ([place]) 01/12/2016  . Venous insufficiency of leg 09/23/2015  . CAD (coronary artery disease) 06/30/2015  . Essential hypertension 06/30/2015  . Essential tremor 06/30/2015  . Preventative health care 06/30/2015  . Carotid artery disease ([place]) 10/02/2014  . Hyperlipidemia   . Type 2 diabetes mellitus with complications [facility])     Past Surgical History  Procedure Laterality Date  . Cholecystectomy  1973  . 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  Allergies Ciprofloxacin and Codeine  Family History  Problem Relation Age of Onset  . Arthritis Mother   . Heart disease Mother   . Heart attack Mother   . Diabetes Sister   . Heart disease Brother   . Diabetes Brother     Social History Social History  Substance Use Topics  . Smoking status: Never Smoker   . Smokeless tobacco: Never Used  . Alcohol Use: Yes     Comment: 4OZ of wine qxnight.    Review of Systems Constitutional: Negative for fever. + somnolence and fatigue Eyes: Negative for visual changes. ENT: Negative for sore throat. Cardiovascular: Negative for chest pain. Respiratory: Negative for shortness of breath. Gastrointestinal: Negative for abdominal pain, vomiting or diarrhea. + anorexia Genitourinary: Negative for dysuria. Musculoskeletal: Negative for back pain. Skin: Negative for rash. Neurological: Negative for headaches, weakness or numbness.  ____________________________________________   PHYSICAL EXAM:  VITAL SIGNS: ED  Triage Vitals  Enc Vitals Group     BP 06/19/16 1212 90/50 mmHg     Pulse Rate 06/19/16 1212 95     Resp 06/19/16 1212 26     Temp 06/19/16 1212 97.6 F (36.4 C)     Temp Source 06/19/16 1212 Oral     SpO2 06/19/16 1209 86  %     Weight 06/19/16 1212 201 lb (91.173 kg)     Height 06/19/16 1212 6\' 1"  (1.854 m)     Head Cir --      Peak Flow --      Pain Score --      Pain Loc --      Pain Edu? --      Excl. in [place]? --     Constitutional: Somnolent, opens eyes when I called his name, will fall asleep during my interview. No distress HEENT:      Head: Normocephalic and atraumatic.         Eyes: Conjunctivae are normal. Sclera is non-icteric. EOMI. PERRL      Mouth/Throat: Mucous membranes are moist.       Neck: Supple with no signs of meningismus. Cardiovascular: Regular rate and rhythm. No murmurs, gallops, or rubs. 2+ symmetrical distal pulses are present in all extremities. No JVD. Respiratory: Normal respiratory effort. Lungs are clear to auscultation bilaterally. No wheezes, crackles, or rhonchi.  Gastrointestinal: Soft, non tender, and non distended with positive bowel sounds. No rebound or guarding. Genitourinary: No suprapubic tenderness. No CVA tenderness. Musculoskeletal: Severe pitting edema on b/l LE, trace edema on abdominal wall and b/l UE. Neurologic: Slurring speech. Face is symmetric. Moving all extremities. No gross focal neurologic deficits are appreciated. Skin: Skin is warm, dry and intact. No rash noted.  ____________________________________________   LABS (all labs ordered are listed, but only abnormal results are displayed)  Labs Reviewed  COMPREHENSIVE METABOLIC PANEL - Abnormal; Notable for the following:    Sodium 129 (*)    Potassium 5.5 (*)    Chloride 97 (*)    CO2 20 (*)    Glucose, Bld 230 (*)    BUN 91 (*)    Creatinine, Ser 1.84 (*)    Calcium 8.0 (*)    Total Protein 4.5 (*)    Albumin 2.0 (*)    AST 103 (*)    Alkaline Phosphatase 283 (*)    Total Bilirubin 1.3 (*)    GFR calc non Af Amer 34 (*)    GFR calc Af Amer 40 (*)    All other components within normal limits  CBC WITH DIFFERENTIAL/PLATELET - Abnormal; Notable for the following:    RBC 2.69 (*)     Hemoglobin 7.5 (*)    HCT 23.3 (*)    RDW 18.9 (*)    Platelets 19 (*)    nRBC 18 (*)    Neutro Abs 7.5 (*)    All other components within normal limits  LACTIC ACID, PLASMA - Abnormal; Notable for the following:    Lactic Acid, Venous 5.8 (*)    All other components within normal limits  LACTIC ACID, PLASMA - Abnormal; Notable for the following:    Lactic Acid, Venous 4.7 (*)    All other components within normal limits  BLOOD GAS, VENOUS - Abnormal; Notable for the following:    pCO2, Ven 37 (*)    pO2, Ven <31.0 (*)    Bicarbonate 19.5 (*)    Acid-base deficit 5.9 (*)  All other components within normal limits  URINALYSIS COMPLETEWITH MICROSCOPIC ([facility] ONLY) - Abnormal; Notable for the following:    Color, Urine AMBER (*)    APPearance HAZY (*)    Glucose, UA 50 (*)    Hgb urine dipstick 3+ (*)    Leukocytes, UA TRACE (*)    Bacteria, UA RARE (*)    Squamous Epithelial / LPF 0-5 (*)    All other components within normal limits  GLUCOSE, CAPILLARY - Abnormal; Notable for the following:    Glucose-Capillary 218 (*)    All other components within normal limits  CULTURE, BLOOD (ROUTINE X 2)  CULTURE, BLOOD (ROUTINE X 2)  URINE CULTURE  BRAIN NATRIURETIC PEPTIDE  TROPONIN I  HEMOGLOBIN A1C  COMPREHENSIVE METABOLIC PANEL  CBC  PROCALCITONIN  TYPE AND SCREEN  PREPARE RBC (CROSSMATCH)   ____________________________________________  EKG  ED ECG REPORT I, Rudene Re, the attending physician, personally viewed and interpreted this ECG.  Sinus rhythm, low voltage QRS, normal intervals, normal axis, no STE or depression. Unchanged from prior. ____________________________________________  RADIOLOGY  CXR: b/l pleural effusions and CHF ____________________________________________   PROCEDURES  Procedure(s) performed: None Critical Care performed: yes  CRITICAL CARE Performed by: Rudene Re  ?  Total critical care time: 40 min  Critical care  time was exclusive of separately billable procedures and treating other patients.  Critical care was necessary to treat or prevent imminent or life-threatening deterioration.  Critical care was time spent personally by me on the following activities: development of treatment plan with patient and/or surrogate as well as nursing, discussions with consultants, evaluation of patient's response to treatment, examination of patient, obtaining history from patient or surrogate, ordering and performing treatments and interventions, ordering and review of laboratory studies, ordering and review of radiographic studies, pulse oximetry and re-evaluation of patient's condition.  ____________________________________________   INITIAL IMPRESSION / ASSESSMENT AND PLAN / ED COURSE   75 y.o. male a history of diabetes, stage IV melanoma who presents for evaluation of hypotension. On exam patient is sleepy but arouses, his vital signs show blood pressure 90/50, patient has severe third spacing on his abdomen in 4 extremities, has diminished air sounds in bilateral bases but is not hypoxic. Chest x-ray showing bilateral pleural effusions but no consolidation. UA with possible UTI with leuks, rare bacteria however no nitrites, BNP WNL, lactic acid 5.8, troponin negative, normal WBC, worsening anemia with hgb 7.5 (baseline 8.8), AKI and hyperkalemia with k 5.5. Differential diagnosis including infection versus dehydration versus third spacing. Patient's albumin is very low at 2. Patient will need IV albumin to be able to diurese safely at this time. We'll consult the hospitalist for admission. Patient was started on cefepime and vancomycin for possible infection/ sepsis. Patient was given 500 cc of fluid. Gentle hydration due to third spacing.  Pertinent labs & imaging results that were available during my care of the patient were reviewed by me and considered in my medical decision making (see chart for  details).    ____________________________________________   FINAL CLINICAL IMPRESSION(S) / ED DIAGNOSES  Final diagnoses:  Sepsis, due to unspecified organism ([place])  AKI Hyperkalemia Anasarca Anemia    NEW MEDICATIONS STARTED DURING THIS VISIT:  Current Discharge Medication List       Note:  This document was prepared using Dragon voice recognition software and may include unintentional dictation errors.    Rudene Re, MD 06/20/16 [phone]

## 2016-06-19 NOTE — Progress Notes (Signed)
Anticoagulation monitoring(Lovenox):  75 yo  ordered Lovenox 30 mg Q24h  Filed Weights   06/19/16 1212  Weight: 201 lb (91.173 kg)   BMI  Lab Results  Component Value Date   CREATININE 1.84* 06/19/2016   CREATININE 0.84 06/14/2016   CREATININE 0.71 06/12/2016   Estimated Creatinine Clearance: 39.2 mL/min (by C-G formula based on Cr of 1.84). Hemoglobin & Hematocrit     Component Value Date/Time   HGB 7.5* 06/19/2016 1232   HCT 23.3* 06/19/2016 1232     Per Protocol for Patient with estCrcl> 30 ml/min and BMI < 40, will transition to Lovenox 40 mg Q24h.

## 2016-06-19 NOTE — Clinical Social Work Note (Signed)
Clinical Social Work Assessment  Patient Details  Name: [name] [name] MRN: [id number] Date of Birth: 1941/09/30  Date of referral:  06/19/16               Reason for consult:  Facility Placement                Permission sought to share information with:  Family Supports, Facility Sport and exercise psychologist Permission granted to share information::     Name::     [name] [name] [phone]  Agency::     Relationship::     Contact Information:     Housing/Transportation Living arrangements for the past 2 months:  [place] of Information:  Facility Patient Interpreter Needed:  None Criminal Activity/Legal Involvement Pertinent to Current Situation/Hospitalization:  No - Comment as needed Significant Relationships:  Adult Children, Spouse Lives with:  Facility Resident Do you feel safe going back to the place where you live?  No (Possible SNF placment needed) Need for family participation in patient care:  Yes (Comment)  Care giving concerns: Pt unable to care for himself at this time.   Social Worker assessment / plan:  CSW completed assessment with facility worker via telephone [phone]. Worker informed CSW that pt has been having problems with his daily cares and is currently unable to ambulate on his own. Pt also has been very confused and has fluctuating levels of orientation. Pt is able to return to facility upon d/c. CSW informed facility that pt will be admitted to ICU. ICU CSW has been notified.  Employment status:  Retired Forensic scientist:  Medicare PT Recommendations:  Not assessed at this time [place] / Referral to community resources:  [place]  Patient/Family's Response to care: Pt will return to SNF upon d/c.  Patient/Family's Understanding of and Emotional Response to Diagnosis, Current Treatment, and Prognosis: Pt is appreciative of CSW assistance at this time.  Emotional Assessment Appearance:  Appears stated  age Attitude/Demeanor/Rapport:  Unable to Assess Affect (typically observed):  Unable to Assess Orientation:  Fluctuating Orientation (Suspected and/or reported [place]) Alcohol / Substance use:  Not Applicable Psych involvement (Current and /or in the community):  No (Comment)  Discharge Needs  Concerns to be addressed:  Care Coordination Readmission within the last 30 days:  Yes Current discharge risk:  None Barriers to Discharge:  No Barriers Identified   Georga Kaufmann, LCSWA 06/19/2016, 2:54 PM

## 2016-06-19 NOTE — Consult Note (Signed)
[facility]  CONSULT NOTE    Date: 06/19/2016                  Patient Name:  [name] [name]  MRN: [id number]  DOB: June 02, 1941  Age / Sex: 75 y.o., male         PCP: Coral Spikes, DO                 Service Requesting Consult: Dr. Ether Griffins                 Reason for Consult: Acute Renal Failure            History of Present Illness: [name] [name] is a 75 y.o. white male with metastaticmelanoma, insulin dependent diabetes mellitus type II, hypertension, BPH, tremor, cholecystecomty , who was admitted to [facility] on 06/19/2016 for ARF (acute renal failure) ([place]) [N17.9] Sepsis, due to unspecified organism ([place]) [A41.9]  Son at bedside and gives history. Patient was admitted to [facility] from 7/2 to 7/6 for pneumonia. He was sent to SNF and now returns in septic shock. Family is unclear how aggressive they would like to be.   Potassium elevated with metabolic acidosis.   Currently being treated with ipilimumab and nivolumab for his malignancy.     Medications: Outpatient medications: Prescriptions prior to admission  Medication Sig Dispense Refill Last Dose  . azithromycin (ZITHROMAX) 250 MG tablet Take 1 tablet (250 mg total) by mouth daily. 1 each 0 unknown at unknown  . cholecalciferol (VITAMIN D) 1000 UNITS tablet Take 1,000 Units by mouth daily.     unknown at unknown  . docusate sodium (COLACE) 100 MG capsule Take 100 mg by mouth daily.   unknown at unknown  . furosemide (LASIX) 20 MG tablet Take one tab po daily as needed for swelling or shortness of breath 30 tablet 0 prn at prn  . insulin lispro protamine-lispro (HUMALOG MIX 75/25) (75-25) 100 UNIT/ML SUSP injection Inject 20-30 Units into the skin 2 (two) times daily with a meal. Pt uses per sliding scale.   unknown at unknown  . nitrofurantoin, macrocrystal-monohydrate, (MACROBID) 100 MG capsule Take 100 mg by mouth daily.   unknown at unknown  . polyethylene glycol (MIRALAX / GLYCOLAX) packet Take 17 g  by mouth daily as needed for mild constipation. 14 each 0 prn at prn  . primidone (MYSOLINE) 50 MG tablet Take 50 mg by mouth daily.    unknown at unknown  . tamsulosin (FLOMAX) 0.4 MG CAPS capsule Take 0.4 mg by mouth daily after breakfast.    unknown at unknown  . Turmeric 500 MG CAPS Take 500 mg by mouth daily.   unknown at unknown  . vitamin B-12 (CYANOCOBALAMIN) 1000 MCG tablet Take 1,000 mcg by mouth daily.   unknown at unknown    Current medications: Current Facility-Administered Medications  Medication Dose Route Frequency Provider Last Rate Last Dose  . 0.9 %  sodium chloride infusion  250 mL Intravenous PRN Theodoro Grist, MD      . 0.9 %  sodium chloride infusion   Intravenous Once Theodoro Grist, MD      . albumin human 25 % solution 12.5 g  12.5 g Intravenous BID Theodoro Grist, MD      . ceFEPIme (MAXIPIME) 1 g in dextrose 5 % 50 mL IVPB  1 g Intravenous Q12H Sheema M Hallaji, RPH      . furosemide (LASIX) injection 40 mg  40 mg Intravenous  BID Theodoro Grist, MD      . insulin aspart (novoLOG) injection 0-9 Units  0-9 Units Subcutaneous TID WC Theodoro Grist, MD      . phenylephrine (NEO-SYNEPHRINE) 10 mg in dextrose 5 % 250 mL (0.04 mg/mL) infusion  0-400 mcg/min Intravenous Titrated Awilda Bill, NP      . sodium chloride flush (NS) 0.9 % injection 3 mL  3 mL Intravenous Q12H Theodoro Grist, MD      . sodium chloride flush (NS) 0.9 % injection 3 mL  3 mL Intravenous Q12H Theodoro Grist, MD      . sodium chloride flush (NS) 0.9 % injection 3 mL  3 mL Intravenous PRN Theodoro Grist, MD      . vancomycin (VANCOCIN) 1,250 mg in sodium chloride 0.9 % 250 mL IVPB  1,250 mg Intravenous Q24H Sheema M Hallaji, RPH          Allergies: Allergies  Allergen Reactions  . Ciprofloxacin Nausea And Vomiting and Other (See Comments)    Reaction:  Shaking   . Codeine Other (See Comments)    Reaction:  Cramping       Past Medical History: Past Medical History  Diagnosis Date  . Insulin  dependent diabetes mellitus ([place])   . Arteriosclerotic coronary artery disease   . Peyronie's disease   . Prostatitis   . Benign essential tremor   . Hyperlipidemia   . Bladder diverticulum   . Melanoma ([place])   . 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     Past Surgical History: Past Surgical History  Procedure Laterality Date  . Cholecystectomy  1973  . Coronary artery bypass graft  04/2010     x6, Done at [facility]     Family History: Family History  Problem Relation Age of Onset  . Arthritis Mother   . Heart disease Mother   . Heart attack Mother   . Diabetes Sister   . Heart disease Brother   . Diabetes Brother      Social History: Social History   Social History  . Marital Status: Married    Spouse Name: N/A  . Number of Children: N/A  . Years of Education: 16   Occupational History  . Merchant    Social History Main Topics  . Smoking status: Never Smoker   . Smokeless tobacco: Never Used  . Alcohol Use: Yes     Comment: 4OZ of wine qxnight.  . Drug Use: No  . Sexual Activity: Not on file   Other Topics Concern  . Not on file   Social History Narrative   Regular exercise-yes     Review of Systems: Review of Systems  Unable to perform ROS: critical illness    Vital Signs: Blood pressure 82/53, pulse 88, temperature 97.9 F (36.6 C), temperature source Oral, resp. rate 26, height 6\' 1"  (1.854 m), weight 91.173 kg (201 lb), SpO2 97 %.  Weight trends: Filed Weights   06/19/16 1212  Weight: 91.173 kg (201 lb)    Physical Exam: General: Critically ill  Head: Normocephalic, atraumatic. Moist oral mucosal membranes  Eyes: Anicteric, PERRL  Neck: Supple, trachea midline  Lungs:  Clear to auscultation  Heart: Regular rate and rhythm  Abdomen:  Soft, nontender  Extremities: no peripheral edema.  Neurologic: Nonfocal, moving all four extremities  Skin: No lesions         Lab results: Basic  Metabolic Panel:  Recent Labs Lab 06/14/16 0447 06/19/16 1232  NA 130* 129*  K 4.4 5.5*  CL 97* 97*  CO2 23 20*  GLUCOSE 145* 230*  BUN 35* 91*  CREATININE 0.84 1.84*  CALCIUM 8.1* 8.0*    Liver Function Tests:  Recent Labs Lab 06/19/16 1232  AST 103*  ALT 32  ALKPHOS 283*  BILITOT 1.3*  PROT 4.5*  ALBUMIN 2.0*   No results for input(s): LIPASE, AMYLASE in the last 168 hours. No results for input(s): AMMONIA in the last 168 hours.  CBC:  Recent Labs Lab 06/14/16 0631 06/15/16 0744 06/19/16 1232  WBC 7.9 10.1 10.2  NEUTROABS  --   --  7.5*  HGB 8.6* 8.8* 7.5*  HCT 25.3* 26.4* 23.3*  MCV 84.1 84.3 86.6  PLT 21* 23* 19*    Cardiac Enzymes:  Recent Labs Lab 06/19/16 1232  TROPONINI <0.03    BNP: Invalid input(s): POCBNP  CBG:  Recent Labs Lab 06/14/16 2117 06/15/16 0743 06/15/16 1147 06/15/16 1416 06/19/16 1538  GLUCAP 104* 73 83 104* 218*    Microbiology: Results for orders placed or performed during the hospital encounter of 06/11/16  Urine culture     Status: Abnormal   Collection Time: 06/11/16  8:45 PM  Result Value Ref Range Status   Specimen Description URINE, CLEAN CATCH  Final   Special Requests Immunocompromised  Final   Culture MULTIPLE SPECIES PRESENT, SUGGEST RECOLLECTION (A)  Final   Report Status 06/13/2016 FINAL  Final  MRSA PCR Screening     Status: None   Collection Time: 06/12/16  2:30 PM  Result Value Ref Range Status   MRSA by PCR NEGATIVE NEGATIVE Final    Comment:        The GeneXpert MRSA Assay (FDA approved for NASAL specimens only), is one component of a comprehensive MRSA colonization surveillance program. It is not intended to diagnose MRSA infection nor to guide or monitor treatment for MRSA infections.     Coagulation Studies: No results for input(s): LABPROT, INR in the last 72 hours.  Urinalysis:  Recent Labs  06/19/16 1243  COLORURINE AMBER*  LABSPEC 1.014   PHURINE 5.0  GLUCOSEU 50*  HGBUR 3+*  BILIRUBINUR NEGATIVE  KETONESUR NEGATIVE  PROTEINUR NEGATIVE  NITRITE NEGATIVE  LEUKOCYTESUR TRACE*      Imaging: Dg Chest Port 1 View  06/19/2016  CLINICAL DATA:  Altered mental status. Metastatic melanoma. Decreased oxygen saturation EXAM: PORTABLE CHEST 1 VIEW COMPARISON:  June 14, 2016 FINDINGS: There are bilateral pleural effusions, larger on the left than on the right. There is atelectasis in the right mid lung. Heart is upper normal in size with pulmonary vascularity within normal limits. No adenopathy evident.  No bone lesions. IMPRESSION: Bilateral pleural effusions. Suspect a degree of congestive heart failure. No airspace consolidation. There is right midlung atelectasis. Stable cardiac silhouette. Electronically Signed   By: Lowella Grip III M.D.   On: 06/19/2016 12:50      Assessment & Plan: [name] [name] is a 75 y.o. white male with metastaticmelanoma, insulin dependent diabetes mellitus type II, hypertension, BPH, tremor, cholecystecomty , who was admitted to [facility] on 06/19/2016  1. Acute Renal Failure 2. Hyperkalemia 3. Metabolic Acidosis 4. Hyponatremia 5. Anemia/thrombocytopenia 6. Septic shock 7. Urinary Tract Infection  Plan Discussed case with family. Checking renal ultrasound. Family is not interested in renal replacement therapy at this time and are moving towards comfort care.  - Start sodium acetate for IV  fluids  LOS: 0 Hargun Spurling 7/10/20174:30 PM

## 2016-06-19 NOTE — Progress Notes (Signed)
Pharmacy Antibiotic Note  [name] [name] is a 75 y.o. male admitted on 06/19/2016 with sepsis.  Pharmacy has been consulted for cefepime and vancomycin dosing. Patient has PMH of metastatic melanoma.  Plan: CrCl: 39 ml/min   Scr: 1.84  Ke: 0.037  Vd: 63   T1/2: 18.7  Patient received cefepime 1gm IV and Vancomycin 1gm IV x1 in ED. Will start patient on vancomycin 1250mg  every 24 hours with 6 hour stack dosing. Calculated trough at Css is 15.8. Will order trough prior to 5th dose.   Will order cefepime 1gm IV every 12 hours based on CrCl <34ml/min.   Will recheck Scr with AM labs and adjust doses as needed if kidney function improves.   Height: 6\' 1"  (185.4 cm) Weight: 201 lb (91.173 kg) IBW/kg (Calculated) : 79.9  Temp (24hrs), Avg:97.6 F (36.4 C), Min:97.6 F (36.4 C), Max:97.6 F (36.4 C)   Recent Labs Lab 06/14/16 0447 06/14/16 0631 06/15/16 0744 06/19/16 1232  WBC  --  7.9 10.1 10.2  CREATININE 0.84  --   --  1.84*  LATICACIDVEN  --   --   --  5.8*    Estimated Creatinine Clearance: 39.2 mL/min (by C-G formula based on Cr of 1.84).    Allergies  Allergen Reactions  . Ciprofloxacin Nausea And Vomiting and Other (See Comments)    Reaction:  Shaking   . Codeine Other (See Comments)    Reaction:  Cramping     Antimicrobials this admission: 7/10 Cefepime  >>  7/10 Vancomycin >>   Microbiology results: 7/10 BCx: pending  7/10 UCx: pending   Thank you for allowing pharmacy to be a part of this patient's care. Nancy Fetter, PharmD Clinical Pharmacist 06/19/2016 2:40 PM

## 2016-06-20 ENCOUNTER — Inpatient Hospital Stay: Payer: Medicare Other

## 2016-06-20 DIAGNOSIS — Z66 Do not resuscitate: Secondary | ICD-10-CM | POA: Insufficient documentation

## 2016-06-20 DIAGNOSIS — C779 Secondary and unspecified malignant neoplasm of lymph node, unspecified: Secondary | ICD-10-CM | POA: Insufficient documentation

## 2016-06-20 DIAGNOSIS — C439 Malignant melanoma of skin, unspecified: Secondary | ICD-10-CM | POA: Insufficient documentation

## 2016-06-20 DIAGNOSIS — Z515 Encounter for palliative care: Secondary | ICD-10-CM

## 2016-06-20 LAB — COMPREHENSIVE METABOLIC PANEL
ALBUMIN: 2.1 g/dL — AB (ref 3.5–5.0)
ALK PHOS: 210 U/L — AB (ref 38–126)
ALT: 26 U/L (ref 17–63)
AST: 84 U/L — AB (ref 15–41)
Anion gap: 12 (ref 5–15)
BILIRUBIN TOTAL: 1.6 mg/dL — AB (ref 0.3–1.2)
BUN: 80 mg/dL — AB (ref 6–20)
CO2: 20 mmol/L — ABNORMAL LOW (ref 22–32)
CREATININE: 1.38 mg/dL — AB (ref 0.61–1.24)
Calcium: 7.6 mg/dL — ABNORMAL LOW (ref 8.9–10.3)
Chloride: 98 mmol/L — ABNORMAL LOW (ref 101–111)
GFR calc Af Amer: 56 mL/min — ABNORMAL LOW (ref >60)
GFR, EST NON AFRICAN AMERICAN: 48 mL/min — AB (ref >60)
GLUCOSE: 197 mg/dL — AB (ref 65–99)
POTASSIUM: 4.5 mmol/L (ref 3.5–5.1)
Sodium: 130 mmol/L — ABNORMAL LOW (ref 135–145)
TOTAL PROTEIN: 4.2 g/dL — AB (ref 6.5–8.1)

## 2016-06-20 LAB — GLUCOSE, CAPILLARY
Glucose-Capillary: 230 mg/dL — ABNORMAL HIGH (ref 65–99)
Glucose-Capillary: 222 mg/dL — ABNORMAL HIGH (ref 65–99)
Glucose-Capillary: 256 mg/dL — ABNORMAL HIGH (ref 65–99)
Glucose-Capillary: 256 mg/dL — ABNORMAL HIGH (ref 65–99)

## 2016-06-20 LAB — CBC
HEMATOCRIT: 28.4 % — AB (ref 40.0–52.0)
Hemoglobin: 9.4 g/dL — ABNORMAL LOW (ref 13.0–18.0)
MCH: 29.6 pg (ref 26.0–34.0)
MCHC: 33.3 g/dL (ref 32.0–36.0)
MCV: 89 fL (ref 80.0–100.0)
PLATELETS: 16 10*3/uL — AB (ref 150–440)
RBC: 3.19 MIL/uL — ABNORMAL LOW (ref 4.40–5.90)
RDW: 18.4 % — AB (ref 11.5–14.5)
WBC: 12.1 10*3/uL — ABNORMAL HIGH (ref 3.8–10.6)

## 2016-06-20 LAB — URINE CULTURE: CULTURE: NO GROWTH

## 2016-06-20 LAB — HEMOGLOBIN A1C: Hgb A1c MFr Bld: 6.5 % — ABNORMAL HIGH (ref 4.0–6.0)

## 2016-06-20 LAB — PROCALCITONIN: Procalcitonin: 1.69 ng/mL

## 2016-06-20 MED ORDER — VANCOMYCIN HCL IN DEXTROSE 1-5 GM/200ML-% IV SOLN
1000.0000 mg | Freq: Two times a day (BID) | INTRAVENOUS | Status: DC
  Administered 2016-06-20 – 2016-06-21 (×2): 1000 mg via INTRAVENOUS
  Filled 2016-06-20 (×3): qty 200

## 2016-06-20 MED ORDER — HYDROCORTISONE NA SUCCINATE PF 100 MG IJ SOLR
50.0000 mg | Freq: Four times a day (QID) | INTRAMUSCULAR | Status: DC
  Administered 2016-06-20 – 2016-06-22 (×9): 50 mg via INTRAVENOUS
  Filled 2016-06-20 (×9): qty 2

## 2016-06-20 NOTE — Care Management (Signed)
Patient from [facility]. CSW updated.

## 2016-06-20 NOTE — Consult Note (Signed)
Consultation Note Date: 06/20/2016   Patient Name: [name] [name]  DOB: March 10, 1941  MRN: [id number]  Age / Sex: 75 y.o., male  PCP: Coral Spikes, DO Referring Physician: Vaughan Basta, MD  Reason for Consultation: Establishing goals of care and Psychosocial/spiritual support  HPI/Patient Profile: 75 y.o. 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 Patient is currently being treated [facility] with combination immunotherapy  who was recently admitted with pneumonia diagnosis and was discharged on 06/15/2016 to [facility] facility comes back from the facility with hypotension and altered mental status, worsening somnolence. On arrival to the hospital patient was noted to have hypotension, hyperkalemia, lactic acidosis, acute renal failure, anasarca, CHF on chest x-ray. Urinalysis revealed pyuria, concerning for urinary tract infection  Acute Renal Failure, Hyperkalemia,  Metabolic Acidosis, Hyponatremia, Anemia/thrombocytopenia,  Urinary Tract Infection  Family is processing concept of mortality and limitations of life prolonging medical intervetnions  Family is faced with advanced directive decisions and anticipatory care needs.  Per medical records Dr Raynelle Chary has discussed hospice with patient and his family  Clinical Assessment and Goals of Care:    This NP Wadie Lessen reviewed medical records, received report from team, assessed the patient and then meet at the patient's bedside along with his wife and son/Ryan  to discuss diagnosis prognosis, GOC, EOL wishes disposition and options.  A  discussion was had today regarding advanced directives.  Concepts specific to code status, artifical feeding and hydration, continued IV antibiotics and  rehospitalization was had.  The difference between a aggressive medical intervention path  and a palliative comfort care path for this patient at this time was had.  Values and goals of care important to patient and family were attempted to be elicited.  Concepts of Hospice and Palliative Care were discussed   Questions and concerns addressed.  Hard Choices booklet left for review. Family encouraged to call with questions or concerns.  PMT will continue to support holistically.       SUMMARY OF RECOMMENDATIONS    Code Status/Advance Care Planning:  DNR    Palliative Prophylaxis:   Aspiration, Bowel Regimen, Delirium Protocol, Frequent Pain Assessment and Oral Care  Additional Recommendations (Limitations, Scope, Preferences):   At this time family's goal is to treat the treatable and remain hopeful for improvement.  They understand the seriousness of the situation!  This man was only diagnosied in May /2017 and they are trying to ensure that they have dione everything and given "him every chance" for improvement and possible return to acceptable  quality of life  They are open to continued conversations regarding aggressiveness of medical interventions over the next few days  Psycho-social/Spiritual:   Desire for further Chaplaincy support:no- decline at this time  Additional Recommendations: Education on Hospice  Prognosis:   Prognosis is poor, without life prolonging measures, likely hours to days.  Discharge Planning: To Be Determined  Primary Diagnoses: Present on Admission:  **None**  I have reviewed the medical record, interviewed the patient and family, and examined the patient. The following aspects are pertinent.  Past Medical History  Diagnosis Date  . Insulin dependent diabetes mellitus ([place])   . Arteriosclerotic coronary artery disease   . Peyronie's disease   . Prostatitis   . Benign essential tremor   . Hyperlipidemia   . Bladder diverticulum    . Melanoma ([place])   . 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   Social History   Social History  . Marital Status: Married    Spouse Name: N/A  . Number of Children: N/A  . Years of Education: 16   Occupational History  . Merchant    Social History Main Topics  . Smoking status: Never Smoker   . Smokeless tobacco: Never Used  . Alcohol Use: Yes     Comment: 4OZ of wine qxnight.  . Drug Use: No  . Sexual Activity: Not Asked   Other Topics Concern  . None   Social History Narrative   Regular exercise-yes   Family History  Problem Relation Age of Onset  . Arthritis Mother   . Heart disease Mother   . Heart attack Mother   . Diabetes Sister   . Heart disease Brother   . Diabetes Brother    Scheduled Meds: . sodium chloride   Intravenous Once  . albumin human  12.5 g Intravenous BID  . ceFEPime (MAXIPIME) IV  1 g Intravenous Q12H  . furosemide  40 mg Intravenous BID  . insulin aspart  0-9 Units Subcutaneous TID WC  . sodium chloride flush  3 mL Intravenous Q12H  . sodium chloride flush  3 mL Intravenous Q12H  . vancomycin  1,250 mg Intravenous Q24H   Continuous Infusions: . dextrose 5 % 1,000 mL with sodium acetate 150 mEq infusion 75 mL/hr at 06/19/16 1757  . phenylephrine (NEO-SYNEPHRINE) Adult infusion 70 mcg/min (06/20/16 0827)   PRN Meds:.sodium chloride, sodium chloride flush Medications Prior to Admission:  Prior to Admission medications   Medication Sig Start Date End Date Taking? Authorizing Provider  azithromycin (ZITHROMAX) 250 MG tablet Take 1 tablet (250 mg total) by mouth daily. 06/15/16  Yes Demetrios Loll, MD  cholecalciferol (VITAMIN D) 1000 UNITS tablet Take 1,000 Units by mouth daily.     Yes Historical Provider, MD  docusate sodium (COLACE) 100 MG capsule Take 100 mg by mouth daily.   Yes Historical Provider, MD  furosemide (LASIX) 20 MG tablet Take one tab po daily as needed  for swelling or shortness of breath 06/08/16  Yes Loletha Grayer, MD  insulin lispro protamine-lispro (HUMALOG MIX 75/25) (75-25) 100 UNIT/ML SUSP injection Inject 20-30 Units into the skin 2 (two) times daily with a meal. Pt uses per sliding scale.   Yes Historical Provider, MD  nitrofurantoin, macrocrystal-monohydrate, (MACROBID) 100 MG capsule Take 100 mg by mouth daily.   Yes Historical Provider, MD  polyethylene glycol (MIRALAX / GLYCOLAX) packet Take 17 g by mouth daily as needed for mild constipation. 06/08/16  Yes Loletha Grayer, MD  primidone (MYSOLINE) 50 MG tablet Take 50 mg by mouth daily.    Yes Historical Provider, MD  tamsulosin (FLOMAX) 0.4 MG CAPS capsule Take 0.4 mg by mouth daily after breakfast.    Yes Historical Provider, MD  Turmeric 500 MG CAPS Take 500 mg by mouth daily.  Yes Historical Provider, MD  vitamin B-12 (CYANOCOBALAMIN) 1000 MCG tablet Take 1,000 mcg by mouth daily.   Yes Historical Provider, MD   Allergies  Allergen Reactions  . Ciprofloxacin Nausea And Vomiting and Other (See Comments)    Reaction:  Shaking   . Codeine Other (See Comments)    Reaction:  Cramping    Review of Systems  Unable to perform ROS: Mental status change    Physical Exam  Constitutional: He appears lethargic. He appears ill.  Cardiovascular: Normal rate, regular rhythm and normal heart sounds.   Pulmonary/Chest: He has decreased breath sounds.  Neurological: He appears lethargic.  Skin: Skin is warm and dry.    Vital Signs: BP 108/57 mmHg  Pulse 87  Temp(Src) 98.2 F (36.8 C) (Oral)  Resp 18  Ht 6\' 1"  (1.854 m)  Wt 91.173 kg (201 lb)  BMI 26.52 kg/m2  SpO2 98% Pain Assessment: No/denies pain   Pain Score: 0-No pain   SpO2: SpO2: 98 % O2 Device:SpO2: 98 % O2 Flow Rate: .O2 Flow Rate (L/min): 13 L/min  IO: Intake/output summary:  Intake/Output Summary (Last 24 hours) at 06/20/16 0947 Last data filed at 06/20/16 0800  Gross per 24 hour  Intake 1773.8 ml    Output      0 ml  Net 1773.8 ml    LBM: Last BM Date: 06/20/16 Baseline Weight: Weight: 91.173 kg (201 lb) Most recent weight: Weight: 91.173 kg (201 lb)      Palliative Assessment/Data: 20 % at best   Discussed with Dr Mortimer Fries  Time In: 0850 Time Out: 1005 Time Total: 75 min Greater than 50%  of this time was spent counseling and coordinating care related to the above assessment and plan.  Signed by: Wadie Lessen, NP   Please contact Palliative Medicine Team phone at [phone] for questions and concerns.  For individual provider: See Shea Evans

## 2016-06-20 NOTE — Clinical Documentation Improvement (Signed)
Internal Medicine  Can the type of Hypotension be further specified? Please document findings in next progress note. Thank you!   Shock - septic shock, hypovolemic shock, other type  Shock ruled out  Hypotension secondary to volume depletion with ARF  Other Cause  Clinically Undetermined  Document suspected or known cause and any associated diagnoses/conditions.  Supporting Information:  "Hypotension, possibly due to sepsis/intravascular depletion due to hypoalbuminemia, initiate Levophed keeping map at 65 and above"   Heart Rate in high 80's, low 90's with Respiratory rates ranging from 23 to 26  BP's 89/48 with Map of 61, 75/48 Map of 57  Fluid resuscitation given in ED with Levophed initiated  Please exercise your independent, professional judgment when responding. A specific answer is not anticipated or expected.  Thank You,  Norm Parcel RN, BSN, [place] Clinical Documentation Specialist [facility] [phone]; cell [phone]

## 2016-06-20 NOTE — Progress Notes (Signed)
Inpatient Diabetes Program Recommendations  AACE/ADA: New Consensus Statement on Inpatient Glycemic Control (2015)  Target Ranges:  Prepandial:   less than 140 mg/dL      Peak postprandial:   less than 180 mg/dL (1-2 hours)      Critically ill patients:  140 - 180 mg/dL   Lab Results  Component Value Date   GLUCAP 230* 06/20/2016   HGBA1C 6.5* 06/15/2016    Review of Glycemic Control  Results for [name] [name], [name] [name] (MRN [id number]) as of 06/20/2016 09:36  Ref. Range 06/19/2016 15:38 06/20/2016 07:18  Glucose-Capillary Latest Ref Range: 65-99 mg/dL 218 (H) 230 (H)   Diabetes history: Type 2 Outpatient Diabetes medications: Humolog 75/25 20-30 units bid Current orders for Inpatient glycemic control: ordered on 06/14/16- Novolog 70/30 20 units bid, Novolog sensitive correction 0-9 units tid        currently Novolog 0-9 units tid  Inpatient Diabetes Program Recommendations: NPO status noted.  Consider adding Lantus 9 units qday (0.1unit/kg) starting now and increase Novolog correction to sensitive correction scale (0-9units) q4h  Gentry Fitz, RN, [place], [place], CDE Diabetes Coordinator Inpatient Diabetes Program  [phone] (Team Pager) [phone] ([place]) 06/20/2016 9:44 AM

## 2016-06-20 NOTE — Progress Notes (Signed)
[place] increased from 74mcg to 67mcg and finally to 110mcg to maintain greater than SBP of 80. B/P holding steady now. Periods of alertness noted. Smiled at times. No other changes.Palative care in to consult with family. Family wishes to wait another 24 hours before making any decisions. Son , daughter and wife with patient all day.

## 2016-06-20 NOTE — Progress Notes (Signed)
Pharmacy Antibiotic Note  [name] [name] is a 75 y.o. male admitted on 06/19/2016 with sepsis.  Pharmacy has been consulted for cefepime and vancomycin dosing. Patient has PMH of metastatic melanoma.  Plan: Will continue cefepime 1g IV Q12hr.    Will transition patient to vancomycin 1g IV Q12hr for goal trough of 15-20. Will obtain trough prior to am dose on 7/13.    Height: 6\' 1"  (185.4 cm) Weight: 201 lb (91.173 kg) IBW/kg (Calculated) : 79.9  Temp (24hrs), Avg:97.8 F (36.6 C), Min:97.6 F (36.4 C), Max:98.2 F (36.8 C)   Recent Labs Lab 06/14/16 0447 06/14/16 0631 06/15/16 0744 06/19/16 1232 06/19/16 1547 06/20/16 0439  WBC  --  7.9 10.1 10.2  --  12.1*  CREATININE 0.84  --   --  1.84*  --  1.38*  LATICACIDVEN  --   --   --  5.8* 4.7*  --     Estimated Creatinine Clearance: 52.3 mL/min (by C-G formula based on Cr of 1.38).    Allergies  Allergen Reactions  . Ciprofloxacin Nausea And Vomiting and Other (See Comments)    Reaction:  Shaking   . Codeine Other (See Comments)    Reaction:  Cramping     Antimicrobials this admission: 7/10 Cefepime  >>  7/10 Vancomycin >>   Microbiology results: 7/10 BCx: pending  7/10 UCx: no growth 7/2 MRSA PCR: negative   Pharmacy will continue to monitor and adjust per consult.   MLS 06/20/2016 2:40 PM

## 2016-06-20 NOTE — Progress Notes (Signed)
[facility] [place] NAME: [name] [name]    MR#:  [id number]  DATE OF BIRTH:  November 03, 1941  SUBJECTIVE:  CHIEF COMPLAINT:   Chief Complaint  Patient presents with  . Altered Mental Status    recent admission with pneumonia, sent to NH 4 days ago. Came back with hypotension and somnolence.   Being treated for sepsis and UTI, with Ac renal failure.   Pt is drowsy and not giving andy complains or ROS.  REVIEW OF SYSTEMS:   Pt is drowsy.  ROS  DRUG ALLERGIES:   Allergies  Allergen Reactions  . Ciprofloxacin Nausea And Vomiting and Other (See Comments)    Reaction:  Shaking   . Codeine Other (See Comments)    Reaction:  Cramping     VITALS:  Blood pressure 101/60, pulse 85, temperature 98.2 F (36.8 C), temperature source Oral, resp. rate 17, height 6\' 1"  (1.854 m), weight 91.173 kg (201 lb), SpO2 100 %.  PHYSICAL EXAMINATION:   GENERAL: 74 y.o.-year-old patient lying in the bed In mild respiratory distress, breathing through the mouth, tachypneic.  EYES: Pupils equal, round, reactive to light and accommodation. No scleral icterus. Extraocular muscles intact.  HEENT: Head atraumatic, normocephalic. Oropharynx and nasopharynx clear. Lymphadenopathy submandibularly NECK: Supple, no jugular venous distention. No thyroid enlargement, no tenderness.  LUNGS: Normal breath sounds bilaterally in upper lobes, decreased breath sounds bilaterally in the bases, no wheezing, few scattered rales,rhonchi and crepitations at bases. Intermittently using accessory muscles of respiration.  CARDIOVASCULAR: S1, S2 normal, rhythm is regular, tachycardic. No murmurs, rubs, or gallops.  ABDOMEN: Soft, nontender, nondistended. Bowel sounds present. No organomegaly or mass.  EXTREMITIES: 3+ lower extremity and pedal edema bilaterally, no cyanosis, or clubbing.  NEUROLOGIC: Cranial nerves and Muscle strength not able to evaluate due to significant  somnolence and poor cooperation. Sensation grossly intact. Gait not checked.  PSYCHIATRIC: The patient is somnolent, opens his eyes and briefly answers questions yes and no, then drifts back to sleep   Physical Exam LABORATORY PANEL:   CBC  Recent Labs Lab 06/20/16 0439  WBC 12.1*  HGB 9.4*  HCT 28.4*  PLT 16*   ------------------------------------------------------------------------------------------------------------------  Chemistries   Recent Labs Lab 06/20/16 0439  NA 130*  K 4.5  CL 98*  CO2 20*  GLUCOSE 197*  BUN 80*  CREATININE 1.38*  CALCIUM 7.6*  AST 84*  ALT 26  ALKPHOS 210*  BILITOT 1.6*   ------------------------------------------------------------------------------------------------------------------  Cardiac Enzymes  Recent Labs Lab 06/19/16 1232  TROPONINI <0.03   ------------------------------------------------------------------------------------------------------------------  RADIOLOGY:  US Renal  06/19/2016  CLINICAL DATA:  Acute renal failure. EXAM: RENAL / URINARY TRACT ULTRASOUND COMPLETE COMPARISON:  Abdomen and pelvis CT dated 10/12/2006. FINDINGS: Right Kidney: Length: 10.4 cm. Borderline increased echogenicity. Mildly dilated collecting system. Left Kidney: Length: 11.3 cm. Borderline increased echogenicity. Mildly dilated collecting system. Bladder: 1.4 cm rounded mass in the posterior aspect of the urinary bladder on the left. There is also mild posterior bladder wall thickening and irregularity at that location. The previously seen that probable bladder diverticula are smaller and not as well visualized today. The urinary bladder is distended and the patient was unable to empty his bladder during the examination. IMPRESSION: 1. Distended urinary bladder with associated mild bilateral hydronephrosis. 2. Borderline increased echogenicity of both kidneys. 3. 1.4 cm rounded mass in the posterior aspect of the urinary bladder on the left  with mild bladder wall thickening and irregularity in that area. Differential  considerations include polyp and malignancy. Electronically Signed   By: Claudie Revering M.D.   On: 06/19/2016 17:17   Dg Chest Port 1 View  06/20/2016  CLINICAL DATA:  Acute respiratory failure, acute CHF, acute renal failure, sepsis, CABG in 2011 EXAM: PORTABLE CHEST 1 VIEW COMPARISON:  Portable chest x-ray of June 19, 2016 FINDINGS: The lungs are borderline hypoinflated. There small bilateral pleural effusions. The heart is normal in size. There is perihilar interstitial prominence bilaterally. Sternal wires are intact. There are post CABG changes. The sternal wires are intact. IMPRESSION: Persistent small bilateral pleural effusions. Mild perihilar interstitial prominence suggestive of low-grade CHF. No cardiomegaly. There has not been significant change since yesterday's study. Electronically Signed   By: David  Martinique M.D.   On: 06/20/2016 08:01   Dg Chest Port 1 View  06/19/2016  CLINICAL DATA:  Altered mental status. Metastatic melanoma. Decreased oxygen saturation EXAM: PORTABLE CHEST 1 VIEW COMPARISON:  June 14, 2016 FINDINGS: There are bilateral pleural effusions, larger on the left than on the right. There is atelectasis in the right mid lung. Heart is upper normal in size with pulmonary vascularity within normal limits. No adenopathy evident.  No bone lesions. IMPRESSION: Bilateral pleural effusions. Suspect a degree of congestive heart failure. No airspace consolidation. There is right midlung atelectasis. Stable cardiac silhouette. Electronically Signed   By: Lowella Grip III M.D.   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 Septic shock- on broad-spectrum antibiotic therapy after urinary, blood cultures were taken, follow culture results   On neocenephrine IV drip. #2. Acute CHF, start patient on albumin/ low-dose of Lasix combination intravenously, follow ins and outs, place Foley catheter, get echocardiogram #3. Acute renal failure,   get renal ultrasound, follow creatinine in the morning   Nephro consult appreciated, Family denied for HD. #4. Hyponatremia    Likely due to infection and sepsis. #5. Hyperkalemia, follow with diuretics,  #6. Hypotension, due to sepsis/intravascular depletion due to hypoalbuminemia, initiate Levophed keeping map at 65 and above #7. Lactic acidosis, follow lactic acid level closely #8 symptomatic anemia, transfuse patient with one unit of packed red blood cells, follow CBC, transfuse platelets if needed, get oncologist involved  All the records are reviewed and case discussed with Care Management/Social Workerr. Management plans discussed with the patient, family and they are in agreement.  CODE STATUS: DNR  TOTAL TIME TAKING CARE OF THIS PATIENT: 35 critical care minutes.   Extremely poor prognosis, called palliative care consult.  POSSIBLE D/C IN 2-3 DAYS, DEPENDING ON CLINICAL CONDITION.   Vaughan Basta M.D on 06/20/2016   Between 7am to 6pm - Pager - [phone]  After 6pm go to [email or web address] - password [email or web address] [facility]  Office  [phone]  CC: Primary care physician; Coral Spikes, DO  Note: This dictation was prepared with Dragon dictation along with smaller phrase technology. Any transcriptional errors that result from this process are unintentional.

## 2016-06-21 DIAGNOSIS — N170 Acute kidney failure with tubular necrosis: Secondary | ICD-10-CM

## 2016-06-21 DIAGNOSIS — D649 Anemia, unspecified: Secondary | ICD-10-CM

## 2016-06-21 DIAGNOSIS — N10 Acute tubulo-interstitial nephritis: Secondary | ICD-10-CM

## 2016-06-21 DIAGNOSIS — D696 Thrombocytopenia, unspecified: Secondary | ICD-10-CM

## 2016-06-21 LAB — GLUCOSE, CAPILLARY
GLUCOSE-CAPILLARY: 240 mg/dL — AB (ref 65–99)
GLUCOSE-CAPILLARY: 231 mg/dL — AB (ref 65–99)
Glucose-Capillary: 258 mg/dL — ABNORMAL HIGH (ref 65–99)
Glucose-Capillary: 262 mg/dL — ABNORMAL HIGH (ref 65–99)

## 2016-06-21 LAB — BASIC METABOLIC PANEL
ANION GAP: 8 (ref 5–15)
BUN: 63 mg/dL — AB (ref 6–20)
CALCIUM: 7.4 mg/dL — AB (ref 8.9–10.3)
CO2: 28 mmol/L (ref 22–32)
CREATININE: 1.1 mg/dL (ref 0.61–1.24)
Chloride: 96 mmol/L — ABNORMAL LOW (ref 101–111)
GFR calc Af Amer: >60 mL/min (ref >60)
GLUCOSE: 257 mg/dL — AB (ref 65–99)
Potassium: 3.6 mmol/L (ref 3.5–5.1)
Sodium: 132 mmol/L — ABNORMAL LOW (ref 135–145)

## 2016-06-21 LAB — CBC
HCT: 23.2 % — ABNORMAL LOW (ref 40.0–52.0)
Hemoglobin: 7.6 g/dL — ABNORMAL LOW (ref 13.0–18.0)
MCH: 28.8 pg (ref 26.0–34.0)
MCHC: 32.9 g/dL (ref 32.0–36.0)
MCV: 87.4 fL (ref 80.0–100.0)
PLATELETS: 13 10*3/uL — AB (ref 150–440)
RBC: 2.65 MIL/uL — ABNORMAL LOW (ref 4.40–5.90)
RDW: 18.8 % — ABNORMAL HIGH (ref 11.5–14.5)
WBC: 9.4 10*3/uL (ref 3.8–10.6)

## 2016-06-21 MED ORDER — INSULIN ASPART 100 UNIT/ML ~~LOC~~ SOLN
0.0000 [IU] | SUBCUTANEOUS | Status: DC
  Administered 2016-06-21 (×2): 3 [IU] via SUBCUTANEOUS
  Administered 2016-06-21: 5 [IU] via SUBCUTANEOUS
  Administered 2016-06-22 (×3): 3 [IU] via SUBCUTANEOUS
  Filled 2016-06-21 (×4): qty 3
  Filled 2016-06-21: qty 5

## 2016-06-21 MED ORDER — SODIUM CHLORIDE 0.9 % IV SOLN
Freq: Once | INTRAVENOUS | Status: AC
  Administered 2016-06-21: 14:00:00 via INTRAVENOUS

## 2016-06-21 MED ORDER — INSULIN GLARGINE 100 UNIT/ML ~~LOC~~ SOLN
14.0000 [IU] | SUBCUTANEOUS | Status: DC
  Administered 2016-06-21: 14 [IU] via SUBCUTANEOUS
  Filled 2016-06-21 (×2): qty 0.14

## 2016-06-21 MED ORDER — INSULIN GLARGINE 100 UNIT/ML ~~LOC~~ SOLN
8.0000 [IU] | SUBCUTANEOUS | Status: DC
  Filled 2016-06-21: qty 0.08

## 2016-06-21 NOTE — Progress Notes (Signed)
Initial Nutrition Assessment  DOCUMENTATION CODES:   Not applicable  INTERVENTION:  -Diet progression as medically able; SLP has been consulted   NUTRITION DIAGNOSIS:   Inadequate oral intake related to acute illness as evidenced by NPO status.  GOAL:   Patient will meet greater than or equal to 90% of their needs  MONITOR:   Diet advancement, Labs, Weight trends, I & O's  REASON FOR ASSESSMENT:   Malnutrition Screening Tool    ASSESSMENT:    75 yo male admitted with AMS, sepsis due to UTI; ARF, CHF. Pt with stage IV melanoma with mets to bladder, kidneys, lymph nodes, bone; palliative care following  Pt currently on pressors, pt has been lethargic. Unable to complete Nutrition-Focused physical exam at this time.    Past Medical History  Diagnosis Date  . Insulin dependent diabetes mellitus ([place])   . Arteriosclerotic coronary artery disease   . Peyronie's disease   . Prostatitis   . Benign essential tremor   . Hyperlipidemia   . Bladder diverticulum   . Melanoma ([place])   . 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     Diet Order:  Diet NPO time specified  Skin:   (stage II heel)  Last BM:  06/20/16   Glucose Profile:   Recent Labs  06/21/16 0728 06/21/16 1223 06/21/16 1543  GLUCAP 258* 262* 240*   Meds: D5-LR at 75 ml;hr, ss novolog, lantus, neosynephrine  Height:   Ht Readings from Last 1 Encounters:  06/19/16 6\' 1"  (1.854 m)    Weight:   Wt Readings from Last 1 Encounters:  06/19/16 201 lb (91.173 kg)    Ideal Body Weight:     BMI:  Body mass index is 26.52 kg/(m^2).  Estimated Nutritional Needs:   Kcal:  2275-2730 kcals   Protein:  110-127 g   Fluid:  >/= 2.2 L  EDUCATION NEEDS:   No education needs identified at this time  [place], [place], [place] [phone] Pager  [phone] Weekend/On-Call Pager

## 2016-06-21 NOTE — Progress Notes (Signed)
[name] [name] at [place] NAME: [name] [name]    MR#:  [id number]  DATE OF BIRTH:  1941-12-02  SUBJECTIVE:  CHIEF COMPLAINT:   Chief Complaint  Patient presents with  . Altered Mental Status    recent admission with pneumonia, sent to NH 4 days ago. Came back with hypotension and somnolence.   Being treated for sepsis and UTI, with Ac renal failure.  renal func and sepsis improved some.   Pt is alert and tolerated oral liquids today.  REVIEW OF SYSTEMS:   Pt is alert, but going back to sleep easily, not talking much.  ROS  DRUG ALLERGIES:   Allergies  Allergen Reactions  . Ciprofloxacin Nausea And Vomiting and Other (See Comments)    Reaction:  Shaking   . Codeine Other (See Comments)    Reaction:  Cramping     VITALS:  Blood pressure 109/60, pulse 85, temperature 98 F (36.7 C), temperature source Oral, resp. rate 24, height 6\' 1"  (1.854 m), weight 91.173 kg (201 lb), SpO2 100 %.  PHYSICAL EXAMINATION:   GENERAL: 75 y.o.-year-old patient lying in the bed , no acute distress.  EYES: Pupils equal, round, reactive to light and accommodation. No scleral icterus. Extraocular muscles intact.  HEENT: Head atraumatic, normocephalic. Oropharynx and nasopharynx clear. Lymphadenopathy submandibularly NECK: Supple, no jugular venous distention. No thyroid enlargement, no tenderness.  LUNGS: Normal breath sounds bilaterally in upper lobes, decreased breath sounds bilaterally in the bases, no wheezing, few scattered rales,rhonchi and crepitations at bases.  CARDIOVASCULAR: S1, S2 normal, rhythm is regular, tachycardic. No murmurs, rubs, or gallops.  ABDOMEN: Soft, nontender, nondistended. Bowel sounds present. No organomegaly or mass.  EXTREMITIES: 3+ lower extremity and pedal edema bilaterally, no cyanosis, or clubbing.  NEUROLOGIC: alert, but drowsy in between, and appears very weak, not much talking, wispers few words- which could not  be identified. Moves limbs very little spontaneously. PSYCHIATRIC: The patient is drowsy, not able to check mood or psych status.   Physical Exam LABORATORY PANEL:   CBC  Recent Labs Lab 06/21/16 0344  WBC 9.4  HGB 7.6*  HCT 23.2*  PLT 13*   ------------------------------------------------------------------------------------------------------------------  Chemistries   Recent Labs Lab 06/20/16 0439 06/21/16 0344  NA 130* 132*  K 4.5 3.6  CL 98* 96*  CO2 20* 28  GLUCOSE 197* 257*  BUN 80* 63*  CREATININE 1.38* 1.10  CALCIUM 7.6* 7.4*  AST 84*  --   ALT 26  --   ALKPHOS 210*  --   BILITOT 1.6*  --    ------------------------------------------------------------------------------------------------------------------  Cardiac Enzymes  Recent Labs Lab 06/19/16 1232  TROPONINI <0.03   ------------------------------------------------------------------------------------------------------------------  RADIOLOGY:  US Renal  06/19/2016  CLINICAL DATA:  Acute renal failure. EXAM: RENAL / URINARY TRACT ULTRASOUND COMPLETE COMPARISON:  Abdomen and pelvis CT dated 10/12/2006. FINDINGS: Right Kidney: Length: 10.4 cm. Borderline increased echogenicity. Mildly dilated collecting system. Left Kidney: Length: 11.3 cm. Borderline increased echogenicity. Mildly dilated collecting system. Bladder: 1.4 cm rounded mass in the posterior aspect of the urinary bladder on the left. There is also mild posterior bladder wall thickening and irregularity at that location. The previously seen that probable bladder diverticula are smaller and not as well visualized today. The urinary bladder is distended and the patient was unable to empty his bladder during the examination. IMPRESSION: 1. Distended urinary bladder with associated mild bilateral hydronephrosis. 2. Borderline increased echogenicity of both kidneys. 3. 1.4 cm rounded mass in the posterior  aspect of the urinary bladder on the left with  mild bladder wall thickening and irregularity in that area. Differential considerations include polyp and malignancy. Electronically Signed   By: Claudie Revering M.D.   On: 06/19/2016 17:17   Dg Chest Port 1 View  06/20/2016  CLINICAL DATA:  Acute respiratory failure, acute CHF, acute renal failure, sepsis, CABG in 2011 EXAM: PORTABLE CHEST 1 VIEW COMPARISON:  Portable chest x-ray of June 19, 2016 FINDINGS: The lungs are borderline hypoinflated. There small bilateral pleural effusions. The heart is normal in size. There is perihilar interstitial prominence bilaterally. Sternal wires are intact. There are post CABG changes. The sternal wires are intact. IMPRESSION: Persistent small bilateral pleural effusions. Mild perihilar interstitial prominence suggestive of low-grade CHF. No cardiomegaly. There has not been significant change since yesterday's study. Electronically Signed   By: David  Martinique M.D.   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 Septic shock- due  To UTI    on broad-spectrum antibiotic therapy after urinary, blood cultures were taken, negative so far.   On neocenephrine IV drip tapering now, BP more stable now.   Ur cx negative so far. #2. Acute CHF, start patient on albumin    No lasix due to hypotension.    Echo ordered. #3. Acute renal failure,    renal ultrasound, follow creatinine in the morning   Nephro consult appreciated, Family denied for HD.    Renal func improved gradually. #4. Hyponatremia    Likely due to infection and sepsis. Monitor. #5. Hyperkalemia, follow with diuretics, improved. #6. Hypotension, due to  sepsis/intravascular depletion due to hypoalbuminemia, initiate Levophed keeping map at 65 and above, improved now, tapering off the pressors. #7. Lactic acidosis, follow lactic acid level closely- improved. #8 symptomatic anemia, and thrombocytopenia transfuse patient with one unit of packed red blood cells, follow CBC, transfuse platelets if needed,  Appreciated oncology inputs. #9. Bladder mass   Urology consult.  All the records are reviewed and case discussed with Care Management/Social Workerr. Management plans discussed with the patient, family and they are in agreement.  CODE STATUS: DNR  TOTAL TIME TAKING CARE OF THIS PATIENT: 35 critical care minutes.   Extremely poor prognosis, called palliative care consult. Some improvement today.  POSSIBLE D/C IN 2-3 DAYS, DEPENDING ON CLINICAL CONDITION.   Vaughan Basta M.D on 06/21/2016   Between 7am to 6pm - Pager - [phone]  After 6pm go to [email or web address] - password [email or web address] [facility]  Office  [phone]  CC: Primary care physician; Coral Spikes, DO  Note: This dictation was prepared with Dragon dictation along with smaller phrase technology. Any transcriptional errors that result from this process are unintentional.

## 2016-06-21 NOTE — Progress Notes (Addendum)
Notified Dr. Mortimer Fries that patient is refusing platelet and PRBC infusion now that I have received the platelets from blood bank.  Paged Dr. Rogue Bussing that patient is refusing both transfusions and waiting on call back at this time.  Wife at bedside.  Per MD Kasa- hold off infusion per patient wishes.

## 2016-06-21 NOTE — Progress Notes (Signed)
Inpatient Diabetes Program Recommendations  AACE/ADA: New Consensus Statement on Inpatient Glycemic Control (2015)  Target Ranges:  Prepandial:   less than 140 mg/dL      Peak postprandial:   less than 180 mg/dL (1-2 hours)      Critically ill patients:  140 - 180 mg/dL  Results for [name] [name], [name] [name] (MRN [id number]) as of 06/21/2016 09:10  Ref. Range 06/20/2016 07:18 06/20/2016 11:43 06/20/2016 16:40 06/20/2016 21:54 06/21/2016 07:28  Glucose-Capillary Latest Ref Range: 65-99 mg/dL 230 (H) 222 (H) 256 (H) 256 (H) 258 (H)    Review of Glycemic Control  Diabetes history: DM2 Outpatient Diabetes medications: Humalog 75/25 20-30 units BID Current orders for Inpatient glycemic control: Novolog 0-9 units TID with meals  Inpatient Diabetes Program Recommendations: Insulin - Basal: Since patient is NPO and glucose has been consistently greater than 250 mg/dl, please consider ordering Lantus 14 units Q24H starting now (based on 91 kg x 0.15 units). Correction (SSI): If patient will remain NPO, please consider changing frequency of CBGs and Novolog to Q4H.   Thanks, Barnie Alderman, RN, MSN, CDE Diabetes Coordinator Inpatient Diabetes Program [phone] (Team Pager from [place] to [place]) [phone] ([facility] office) [phone] [facility] office) [phone] [facility] office)

## 2016-06-21 NOTE — Progress Notes (Signed)
Notified Dr. Rogue Bussing that patient has refused platelet and PRBC infusion.  Patient never had that platelets infused- though bag was scanned- but patient refused right before the actual infusion.  Wife at bedside and aware.  Per MD- hold off infusion per patient wishes.

## 2016-06-21 NOTE — Consult Note (Signed)
PULMONARY / CRITICAL CARE MEDICINE   Name: [name] [name] MRN: [id number] DOB: 09/21/1941    ADMISSION DATE:  06/19/2016 CONSULTATION DATE:  06/19/2016  REFERRING MD:  Dr. Ether Griffins  CHIEF COMPLAINT: Altered mental statua  HISTORY OF PRESENT ILLNESS:   requiring pressors. Patient lethargic and unable to provide ROS    REVIEW OF SYSTEMS:   Unable to assess pt lethargic    VITAL SIGNS: BP 108/61 mmHg  Pulse 81  Temp(Src) 97.6 F (36.4 C) (Oral)  Resp 26  Ht 6\' 1"  (1.854 m)  Wt 201 lb (91.173 kg)  BMI 26.52 kg/m2  SpO2 100%   I/O last 3 completed shifts: In: 3227.6 [P.O.:75; I.V.:2772.6; Blood:380] Out: 4 [Urine:3; Stool:1]  PHYSICAL EXAMINATION: General:  Critically ill appearing male Neuro:  Lethargic follows commands  HEENT:  Supple, no JVD Cardiovascular:  s1s2, rrr, no M/R/G Lungs: rhonchi bilateral lower bases, diminished throughout, even, non labored Abdomen:  Hypoactive BS x4, soft, non tender, non distended  Musculoskeletal:  Normal bulk and tone Skin:  Pale, buttocks reddened, stage II pressure ulcer left heel  LABS:  BMET  Recent Labs Lab 06/19/16 1232 06/20/16 0439 06/21/16 0344  NA 129* 130* 132*  K 5.5* 4.5 3.6  CL 97* 98* 96*  CO2 20* 20* 28  BUN 91* 80* 63*  CREATININE 1.84* 1.38* 1.10  GLUCOSE 230* 197* 257*    Electrolytes  Recent Labs Lab 06/19/16 1232 06/20/16 0439 06/21/16 0344  CALCIUM 8.0* 7.6* 7.4*    CBC  Recent Labs Lab 06/15/16 0744 06/19/16 1232 06/20/16 0439  WBC 10.1 10.2 12.1*  HGB 8.8* 7.5* 9.4*  HCT 26.4* 23.3* 28.4*  PLT 23* 19* 16*    Coag's No results for input(s): APTT, INR in the last 168 hours.  Sepsis Markers  Recent Labs Lab 06/19/16 1232 06/19/16 1547 06/20/16 0439  LATICACIDVEN 5.8* 4.7*  --   PROCALCITON  --   --  1.69    ABG No results for input(s): PHART, PCO2ART, PO2ART in the last 168 hours.  Liver Enzymes  Recent Labs Lab 06/19/16 1232 06/20/16 0439  AST 103* 84*   ALT 32 26  ALKPHOS 283* 210*  BILITOT 1.3* 1.6*  ALBUMIN 2.0* 2.1*    Cardiac Enzymes  Recent Labs Lab 06/19/16 1232  TROPONINI <0.03    Glucose  Recent Labs Lab 06/19/16 1538 06/20/16 0718 06/20/16 1143 06/20/16 1640 06/20/16 2154 06/21/16 0728  GLUCAP 218* 230* 222* 256* 256* 258*    Imaging No results found.   STUDIES:  None  CULTURES: Blood 7/10>> Urine 7/10>>  ANTIBIOTICS: Maxipime 7/10>> Vancomycin 7/10>>  SIGNIFICANT EVENTS: -Pt admitted to ICU on 7/10 due to septic shock secondary to UTI requiring pressors  LINES/TUBES: PIV x3  DISCUSSION: This is a 75 y.o. presented [facility] on [facility]/10 on arrival he was hypotensive, hyperkalemia, lactic acidosis, acute renal failure, anasarca, CHF on CXR. PCCM consulted 7/10 due to sepsis secondary to urinalysis revealing pyuria concerning for urinary tract infection and  requiring pressors.  ASSESSMENT / PLAN:  PULMONARY Oxygen as needed  CARDIOVASCULAR A: Hypotension Hx: CAD and Hyperlipidemia P:  Telemetry monitoring  Neo synephrine as needed to maintain map >65 via peripheral will not place central line due to platelet count of 16  RENAL A:   Acute renal failure Lactic Acidosis Hyperkalemia Hyponatremia  P:   Trend BMP's Monitor UOP  Nephrology consulted appreciate input Administer albumin and low dose lasix  As per Nephrology- family does NOT want HD  GASTROINTESTINAL A:   No acute issues P:   Keep NPO for now    HEMATOLOGIC A:   Anemia Thrombocytopenia Hx: Metastatic melanoma  P:  Transfuse 1 unit pRBC's managed per internal medicine physician Monitor for s/sx of bleeding Oncology consulted appreciate input  INFECTIOUS A:   Acute pyelonephritis  ?HCAP P:   Continue abx as listed above Trend WBC and monitor fever curve Follow cultures - lactic acid   ENDOCRINE A:   DM  P:   SSI Monitor CBG's q4hrs  NEUROLOGIC A:   Hx: Benign essential tremor  P:   RASS goal:  N/A Avoid sedating medications Frequent reorientation  Lights on during day    FAMILY  - Updates: No family at bedside to update      The Patient requires high complexity decision making for assessment and support, frequent evaluation and titration of therapies, application of advanced monitoring technologies and extensive interpretation of multiple databases. Critical Care Time devoted to patient care services described in this note is 35 minutes.   Overall, patient is critically ill, prognosis is guarded.  Patient with Multiorgan failure and at high risk for cardiac arrest and death.  Recommend comfort care measures in setting of metastatic melanoma, palliative care following  Rivaldo Hineman Patricia Pesa, M.D.  [facility]  Medical Director [place] Director [facility] Cardio-Pulmonary Department

## 2016-06-21 NOTE — Consult Note (Signed)
[facility] NOTE  Patient Care Team: Coral Spikes, DO as PCP - General (Family Medicine)  CHIEF COMPLAINTS/PURPOSE OF CONSULTATION:  Metastatic melanoma  CC:  Patient is drowsy; intermittently alert. Hence difficult to elicit any history.  As per the RN- he continues to be drowsy; but responsive verbally. He continues to be on Neo- however this is being weaned off. This morning patient has declined a transfusion support. No bleeding. No pain.  ROS: Unable to assess as patient has altered mental status.   MEDICAL HISTORY:  Past Medical History  Diagnosis Date  . Insulin dependent diabetes mellitus ([place])   . Arteriosclerotic coronary artery disease   . Peyronie's disease   . Prostatitis   . Benign essential tremor   . Hyperlipidemia   . Bladder diverticulum   . Melanoma ([place])   . 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    SURGICAL HISTORY: Past Surgical History  Procedure Laterality Date  . Cholecystectomy  1973  . Coronary artery bypass graft  04/2010     x6, Done at [facility]: Social History   Social History  . Marital Status: Married    Spouse Name: N/A  . Number of Children: N/A  . Years of Education: 16   Occupational History  . Merchant    Social History Main Topics  . Smoking status: Never Smoker   . Smokeless tobacco: Never Used  . Alcohol Use: Yes     Comment: 4OZ of wine qxnight.  . Drug Use: No  . Sexual Activity: Not on file   Other Topics Concern  . Not on file   Social History Narrative   Regular exercise-yes    FAMILY HISTORY: Family History  Problem Relation Age of Onset  . Arthritis Mother   . Heart disease Mother   . Heart attack Mother   . Diabetes Sister   . Heart disease Brother   . Diabetes Brother     ALLERGIES:  is allergic to ciprofloxacin and codeine.  MEDICATIONS:  Current Facility-Administered Medications   Medication Dose Route Frequency Provider Last Rate Last Dose  . 0.9 %  sodium chloride infusion  250 mL Intravenous PRN Theodoro Grist, MD   Stopped at 06/19/16 2000  . 0.9 %  sodium chloride infusion   Intravenous Once Theodoro Grist, MD      . albumin human 25 % solution 12.5 g  12.5 g Intravenous BID Theodoro Grist, MD   12.5 g at 06/21/16 1020  . ceFEPIme (MAXIPIME) 1 g in dextrose 5 % 50 mL IVPB  1 g Intravenous Q12H Flora Lipps, MD   1 g at 06/21/16 1020  . dextrose 5 % 1,000 mL with sodium acetate 150 mEq infusion   Intravenous Continuous Lavonia Dana, MD 75 mL/hr at 06/21/16 1714    . hydrocortisone sodium succinate (SOLU-CORTEF) 100 MG injection 50 mg  50 mg Intravenous Q6H Bincy S Varughese, NP   50 mg at 06/21/16 1639  . insulin aspart (novoLOG) injection 0-9 Units  0-9 Units Subcutaneous Q4H Flora Lipps, MD   3 Units at 06/21/16 2025  . insulin glargine (LANTUS) injection 14 Units  14 Units Subcutaneous Q24H Vaughan Basta, MD   14 Units at 06/21/16 1242  . phenylephrine (NEO-SYNEPHRINE) 40 mg in dextrose 5 % 250 mL (0.16 mg/mL) infusion  0-400 mcg/min Intravenous Titrated Theodoro Grist, MD  Stopped at 06/21/16 1644  . sodium chloride flush (NS) 0.9 % injection 3 mL  3 mL Intravenous Q12H Theodoro Grist, MD   3 mL at 06/20/16 2145  . sodium chloride flush (NS) 0.9 % injection 3 mL  3 mL Intravenous Q12H Theodoro Grist, MD   3 mL at 06/21/16 1028  . sodium chloride flush (NS) 0.9 % injection 3 mL  3 mL Intravenous PRN Theodoro Grist, MD   3 mL at 06/21/16 1021      .  PHYSICAL EXAMINATION:   Filed Vitals:   06/21/16 1900 06/21/16 2000  BP: 99/58 99/59  Pulse: 81 86  Temp: 98 F (36.7 C)   Resp: 15 30   Filed Weights   06/19/16 1212  Weight: 201 lb (91.173 kg)    GENERAL: Moderately nourished; Poorly nourished Caucasian male patient.  EYES: Positive for pallor. OROPHARYNX: no thrush or ulceration. NECK: supple, no masses felt LYMPH: no palpable lymphadenopathy  in the cervical, axillary or inguinal regions LUNGS: decreased breath sounds to auscultation at bases and No wheeze or crackles HEART/CVS: regular rate & rhythm and no murmurs;Positive for edema extremity edema ABDOMEN: abdomen soft, non-tender and normal bowel sounds Musculoskeletal:no cyanosis of digits and no clubbing  PSYCH: Drowsy; arousable.  NEURO: no focal motor/sensory deficits SKIN: Multiple 1 cm sized "moles " noted on the chest  LABORATORY DATA:  I have reviewed the data as listed Lab Results  Component Value Date   WBC 9.4 06/21/2016   HGB 7.6* 06/21/2016   HCT 23.2* 06/21/2016   MCV 87.4 06/21/2016   PLT 13* 06/21/2016    Recent Labs  05/09/16 1019  06/12/16 0409  06/19/16 1232 06/20/16 0439 06/21/16 0344  NA  --   < > 131*  < > 129* 130* 132*  K  --   < > 4.1  < > 5.5* 4.5 3.6  CL  --   < > 98*  < > 97* 98* 96*  CO2  --   < > 26  < > 20* 20* 28  GLUCOSE  --   < > 203*  < > 230* 197* 257*  BUN  --   < > 36*  < > 91* 80* 63*  CREATININE  --   < > 0.71  < > 1.84* 1.38* 1.10  CALCIUM  --   < > 7.5*  < > 8.0* 7.6* 7.4*  GFRNONAA  --   < > >60  < > 34* 48* >60  GFRAA  --   < > >60  < > 40* 56* >60  PROT 4.8*  < > 4.6*  --  4.5* 4.2*  --   ALBUMIN 1.8*  < > 1.9*  --  2.0* 2.1*  --   AST 105*  < > 46*  --  103* 84*  --   ALT 62  < > 16*  --  32 26  --   ALKPHOS 191*  < > 248*  --  283* 210*  --   BILITOT 0.5  < > 1.7*  --  1.3* 1.6*  --   BILIDIR 0.2  --   --   --   --   --   --   IBILI 0.3  --   --   --   --   --   --   < > = values in this interval not displayed.  RADIOGRAPHIC STUDIES: I have personally reviewed the radiological images as listed and agreed with  the findings in the report. Dg Chest 2 View  06/14/2016  CLINICAL DATA:  Pneumonia, history of diabetes, coronary artery disease, nonsmoker. EXAM: CHEST  2 VIEW COMPARISON:  Chest x-ray of June 12, 2016 FINDINGS: The exam is performed in a lordotic projection. The lungs remain mildly hypoinflated.  Bibasilar densities are present consistent with atelectasis and pleural effusions. The heart is normal in size. The pulmonary vascularity is not engorged. The patient has undergone previous CABG. The bony thorax exhibits no acute abnormality. There is chronic deformity of the lateral aspect of the left fifth rib. IMPRESSION: Small bilateral pleural effusions. Bibasilar atelectasis or pneumonia greater on the left. No significant pulmonary edema. Electronically Signed   By: David  Martinique M.D.   On: 06/14/2016 07:35   Dg Chest 2 View  06/12/2016  CLINICAL DATA:  Follow-up bilateral pleural effusions. EXAM: CHEST  2 VIEW COMPARISON:  06/11/2016. FINDINGS: Prior CABG. Bibasilar subsegmental atelectasis. Mild left lower lobe infiltrate. Small left pleural effusion again noted. Tiny right pleural effusion cannot be excluded . No pneumothorax. IMPRESSION: 1. Prior CABG. 2. Bibasilar subsegmental atelectasis. Mild left lower lobe infiltrate and small left pleural effusion. Tiny right effusion cannot be entirely excluded . No significant change from prior exam . Electronically Signed   By: Marcello Moores  Register   On: 06/12/2016 08:11   Dg Chest 2 View  06/11/2016  CLINICAL DATA:  Shortness of Breath EXAM: CHEST  2 VIEW COMPARISON:  06/07/2016 FINDINGS: Cardiac shadow is within normal limits. Postoperative changes are noted. Increasing left-sided pleural effusion is noted with likely underlying atelectasis. A smaller right pleural effusion is noted as well. IMPRESSION: New effusions bilaterally left greater than right with associated left basilar atelectasis. Electronically Signed   By: Inez Catalina M.D.   On: 06/11/2016 12:14   Dg Chest 2 View  06/07/2016  CLINICAL DATA:  Left-sided thoracentesis EXAM: CHEST  2 VIEW COMPARISON:  Yesterday FINDINGS: Diminished left pleural effusion status post thoracentesis. No re-expansion edema or pneumothorax. Mild left basilar atelectasis. Mild residual pleural fluid or thickening.  Normal heart size and aortic contours.  Status post CABG. Known sclerotic osseous metastases are not well visualized. IMPRESSION: 1. No acute finding after left thoracentesis. 2. Left basilar atelectasis. Electronically Signed   By: Monte Fantasia M.D.   On: 06/07/2016 12:58   Dg Chest 2 View  06/06/2016  CLINICAL DATA:  Weakness, shortness of Breath, metastatic melanoma EXAM: CHEST  2 VIEW COMPARISON:  05/07/2016 FINDINGS: Cardiomediastinal silhouette is stable. Status post median sternotomy. Linear atelectasis or scarring noted right lower lobe perihilar. There is small left pleural effusion left basilar atelectasis or infiltrate. No pulmonary edema. IMPRESSION: Status post CABG. Small left pleural effusion with left basilar atelectasis or infiltrate. Electronically Signed   By: Lahoma Crocker M.D.   On: 06/06/2016 16:40   Dg Lumbar Spine Complete  06/07/2016  CLINICAL DATA:  Low back pain EXAM: LUMBAR SPINE - COMPLETE 4+ VIEW COMPARISON:  04/29/2014 FINDINGS: Scattered large and small bowel gas is noted. Fecal material is noted throughout the colon. A 12 mm triangular shaped density is noted in the lower pole of the right kidney consistent with a nonobstructing stone. There is fullness of the collecting system and ureter on the left suspicious for a distal ureteral calculus. Significant trabeculation of the bladder is noted. Multilevel degenerative changes are seen with osteophytic change and disc space narrowing. Facet hypertrophic changes are noted. No compression deformities are seen. Diffuse aortic calcifications are noted IMPRESSION: Increase in  size in the right lower pole renal calculus. Changes suggestive of at least partial ureteral obstruction on the left. Further evaluation is recommended. This may contribute to the patient's underlying back pain. Degenerative changes of the lumbar spine without acute abnormality. Increased trabeculation of the bladder which may be related to bladder outlet  obstruction. Electronically Signed   By: Inez Catalina M.D.   On: 06/07/2016 09:41   Ct Angio Chest Pe W/cm &/or Wo Cm  06/06/2016  CLINICAL DATA:  Coughing and shortness of breath. Evaluate for pulmonary embolus. EXAM: CT ANGIOGRAPHY CHEST WITH CONTRAST TECHNIQUE: Multidetector CT imaging of the chest was performed using the standard protocol during bolus administration of intravenous contrast. Multiplanar CT image reconstructions and MIPs were obtained to evaluate the vascular anatomy. CONTRAST:  100 mL of Isovue 370 COMPARISON:  CT scan of the chest January 11, 2016 and chest x-ray from today FINDINGS: The central airways are normal. No pneumothorax. There is a small right pleural effusion and moderate size left effusion, both with associated compressive atelectasis. No pulmonary nodules, masses, or other focal infiltrates. Evaluation for pulmonary emboli is limited in the lower lobes due to compressive atelectasis and effusions. Within this limitation, no pulmonary emboli are identified. There are coronary artery calcifications. The thoracic aorta is non aneurysmal with no dissection. The heart size is unchanged. No pericardial effusion. There is adenopathy in the chest and abdomen. The adenopathy is diffuse. For example, there is a node just deep to the left pectoralis muscle on series 4, image 14 measuring 18 x 18 mm. Nodes in the left axilla and elsewhere in the left retro pectoral region are prominent but not enlarged by CT criteria. There is also a node at the midline of the base of neck on series 4, image 6 which is larger in the interval. Enlarging prevascular nodes are seen. Numerous newly enlarged nodes are seen in the epicardial fat best seen on series 4, images 112-115. A representative node on image 113 to the left of midline measures 13 by 16 mm. Numerous enlarged nodes are seen in the upper abdomen as well. A representative node in the fat superior to the right kidney on series 4, image 136  measures 23 by 19 mm. Visualized portions of the adrenal glands, abdominal aorta, stomach, and liver are normal. The spleen is enlarged and heterogeneous but only partially visualized. Increased attenuation is seen in the subcutaneous fat diffusely suggesting volume overload/ edema. Bilateral gynecomastia is noted. There is increased patchy attenuation in the bones, best seen in the spine. The ribs and inferior sternum appear to be involved as well. Review of the MIP images confirms the above findings. IMPRESSION: 1. No pulmonary emboli are identified. 2. Diffuse adenopathy in the chest and upper abdomen in addition to scattered sclerosis throughout the visualized bones is most consistent with metastatic disease. The patient has a history of melanoma and prostate cancer. 3. Bilateral pleural effusions and atelectasis. 4. The spleen remains enlarged and is heterogeneous. The heterogeneity may be at least partially due to see arterial phase imaging. Electronically Signed   By: Dorise Bullion III M.D   On: 06/06/2016 17:22   Mr Brain W Wo Contrast  06/12/2016  CLINICAL DATA:  Confusion.  History of melanoma. EXAM: MRI HEAD WITHOUT AND WITH CONTRAST TECHNIQUE: Multiplanar, multiecho pulse sequences of the brain and surrounding structures were obtained without and with intravenous contrast. CONTRAST:  8mL MULTIHANCE GADOBENATE DIMEGLUMINE 529 MG/ML IV SOLN COMPARISON:  Head CT 05/06/2016 FINDINGS: Calvarium  and upper cervical spine: Heterogeneous enhancement throughout the central skullbase and upper cervical spine consistent with metastatic disease. Orbits: Negative. Sinuses and Mastoids: Completely opacified left maxillary antrum with mild atelectasis, consistent with chronic obstruction. No nasal cavity mass is seen. Brain: No evidence of brain metastasis. No acute infarct, hemorrhage, hydrocephalus, or mass lesion. No evidence of large vessel occlusion. Moderate generalized atrophy. IMPRESSION: 1. No acute  finding or brain metastasis. 2. Known osseous metastatic disease. 3. Moderate atrophy. 4. Chronically obstructed left maxillary sinus. Electronically Signed   By: Monte Fantasia M.D.   On: 06/12/2016 15:37   US Renal  06/19/2016  CLINICAL DATA:  Acute renal failure. EXAM: RENAL / URINARY TRACT ULTRASOUND COMPLETE COMPARISON:  Abdomen and pelvis CT dated 10/12/2006. FINDINGS: Right Kidney: Length: 10.4 cm. Borderline increased echogenicity. Mildly dilated collecting system. Left Kidney: Length: 11.3 cm. Borderline increased echogenicity. Mildly dilated collecting system. Bladder: 1.4 cm rounded mass in the posterior aspect of the urinary bladder on the left. There is also mild posterior bladder wall thickening and irregularity at that location. The previously seen that probable bladder diverticula are smaller and not as well visualized today. The urinary bladder is distended and the patient was unable to empty his bladder during the examination. IMPRESSION: 1. Distended urinary bladder with associated mild bilateral hydronephrosis. 2. Borderline increased echogenicity of both kidneys. 3. 1.4 cm rounded mass in the posterior aspect of the urinary bladder on the left with mild bladder wall thickening and irregularity in that area. Differential considerations include polyp and malignancy. Electronically Signed   By: Claudie Revering M.D.   On: 06/19/2016 17:17   Dg Chest Port 1 View  06/20/2016  CLINICAL DATA:  Acute respiratory failure, acute CHF, acute renal failure, sepsis, CABG in 2011 EXAM: PORTABLE CHEST 1 VIEW COMPARISON:  Portable chest x-ray of June 19, 2016 FINDINGS: The lungs are borderline hypoinflated. There small bilateral pleural effusions. The heart is normal in size. There is perihilar interstitial prominence bilaterally. Sternal wires are intact. There are post CABG changes. The sternal wires are intact. IMPRESSION: Persistent small bilateral pleural effusions. Mild perihilar interstitial prominence  suggestive of low-grade CHF. No cardiomegaly. There has not been significant change since yesterday's study. Electronically Signed   By: David  Martinique M.D.   On: 06/20/2016 08:01   Dg Chest Port 1 View  06/19/2016  CLINICAL DATA:  Altered mental status. Metastatic melanoma. Decreased oxygen saturation EXAM: PORTABLE CHEST 1 VIEW COMPARISON:  June 14, 2016 FINDINGS: There are bilateral pleural effusions, larger on the left than on the right. There is atelectasis in the right mid lung. Heart is upper normal in size with pulmonary vascularity within normal limits. No adenopathy evident.  No bone lesions. IMPRESSION: Bilateral pleural effusions. Suspect a degree of congestive heart failure. No airspace consolidation. There is right midlung atelectasis. Stable cardiac silhouette. Electronically Signed   By: Lowella Grip III M.D.   On: 06/19/2016 12:50   US Thoracentesis Asp Pleural Space W/img Guide  06/07/2016  INDICATION: Left pleural effusion. EXAM: ULTRASOUND GUIDED left THORACENTESIS MEDICATIONS: None. COMPLICATIONS: None immediate. PROCEDURE: An ultrasound guided thoracentesis was thoroughly discussed with the patient and questions answered. The benefits, risks, alternatives and complications were also discussed. The patient understands and wishes to proceed with the procedure. Written consent was obtained. Ultrasound was performed to localize and mark an adequate pocket of fluid in the left chest. The area was then prepped and draped in the normal sterile fashion. 1% Lidocaine was used for local  anesthesia. Under ultrasound guidance a Safe-T-Centesis catheter was introduced. Thoracentesis was performed. The catheter was removed and a dressing applied. FINDINGS: A total of approximately 900 mL of serous fluid was removed. Samples were sent to the laboratory as requested by the clinical team. IMPRESSION: Successful ultrasound guided left thoracentesis yielding 900 mL of pleural fluid. Electronically Signed    By: Marijo Conception, M.D.   On: 06/07/2016 13:10    ASSESSMENT & PLAN:   75 year old male patient with metastatic melanoma to the bladder/ bone/ metastases Currently on Combination immunotherapy is currently admitted to the hospital foraltered mental status.  # Altered mental status/sepsis-slight improvement on current IV antibiotics. Continues to be on vasopressors.  # Metastatic melanoma/progressive-poor prognosis  # Severe anemia severe thrombocytopenia platelets 13- no active bleeding. Patient declined any PRBC transfusion or platelet transfusion. He expresses wishes to the nurse. Family aware of the patient's wishes.  #I had a long discussion with the patient's wife daughter and son- even though there is slight clinical improvement; in the big picture the overall prognosis continues to be poor. Since the patient wished to transfusion; I would not recommend any transfusions at this time. With regards to next plan of care- await palliative care reevaluation the morning; and then transition the patient to in patient hospice in Hospital versus hospice in hospice home. Family seemed to be more realistic of the overall poor prognosis at this time. I conveyed my recommendations to the nurse also. For now continue current care.     Cammie Sickle, MD 06/21/2016 8:28 PM

## 2016-06-22 ENCOUNTER — Inpatient Hospital Stay
Admit: 2016-06-22 | Discharge: 2016-06-22 | Disposition: A | Discharge status: Home / Self Care | Payer: Medicare Other | Attending: Internal Medicine | Admitting: Internal Medicine

## 2016-06-22 DIAGNOSIS — I5021 Acute systolic (congestive) heart failure: Secondary | ICD-10-CM

## 2016-06-22 DIAGNOSIS — J9600 Acute respiratory failure, unspecified whether with hypoxia or hypercapnia: Secondary | ICD-10-CM | POA: Insufficient documentation

## 2016-06-22 DIAGNOSIS — J9601 Acute respiratory failure with hypoxia: Secondary | ICD-10-CM

## 2016-06-22 LAB — TYPE AND SCREEN
ABO/RH(D): O POS
ANTIBODY SCREEN: NEGATIVE
UNIT DIVISION: 0
Unit division: 0

## 2016-06-22 LAB — CBC
HCT: 24.7 % — ABNORMAL LOW (ref 40.0–52.0)
Hemoglobin: 8.2 g/dL — ABNORMAL LOW (ref 13.0–18.0)
MCH: 29.5 pg (ref 26.0–34.0)
MCHC: 33.4 g/dL (ref 32.0–36.0)
MCV: 88.3 fL (ref 80.0–100.0)
PLATELETS: 13 10*3/uL — AB (ref 150–440)
RBC: 2.79 MIL/uL — ABNORMAL LOW (ref 4.40–5.90)
RDW: 18.9 % — AB (ref 11.5–14.5)
WBC: 7.5 10*3/uL (ref 3.8–10.6)

## 2016-06-22 LAB — GLUCOSE, CAPILLARY
GLUCOSE-CAPILLARY: 222 mg/dL — AB (ref 65–99)
Glucose-Capillary: 229 mg/dL — ABNORMAL HIGH (ref 65–99)
Glucose-Capillary: 231 mg/dL — ABNORMAL HIGH (ref 65–99)

## 2016-06-22 LAB — COMPREHENSIVE METABOLIC PANEL
ALBUMIN: 2.3 g/dL — AB (ref 3.5–5.0)
ALT: 18 U/L (ref 17–63)
ANION GAP: 11 (ref 5–15)
AST: 46 U/L — ABNORMAL HIGH (ref 15–41)
Alkaline Phosphatase: 157 U/L — ABNORMAL HIGH (ref 38–126)
BILIRUBIN TOTAL: 1.7 mg/dL — AB (ref 0.3–1.2)
BUN: 55 mg/dL — ABNORMAL HIGH (ref 6–20)
CHLORIDE: 95 mmol/L — AB (ref 101–111)
CO2: 29 mmol/L (ref 22–32)
Calcium: 7.4 mg/dL — ABNORMAL LOW (ref 8.9–10.3)
Creatinine, Ser: 1.03 mg/dL (ref 0.61–1.24)
GFR calc Af Amer: >60 mL/min (ref >60)
GFR calc non Af Amer: >60 mL/min (ref >60)
GLUCOSE: 232 mg/dL — AB (ref 65–99)
POTASSIUM: 3.5 mmol/L (ref 3.5–5.1)
SODIUM: 135 mmol/L (ref 135–145)
TOTAL PROTEIN: 4.4 g/dL — AB (ref 6.5–8.1)

## 2016-06-22 LAB — PREPARE RBC (CROSSMATCH)

## 2016-06-22 LAB — PREPARE PLATELET PHERESIS: Unit division: 0

## 2016-06-22 LAB — ECHOCARDIOGRAM COMPLETE
HEIGHTINCHES: 73 in
WEIGHTICAEL: 3216 [oz_av]

## 2016-06-22 MED ORDER — DEXTROSE 5 % IV SOLN
1.0000 g | Freq: Three times a day (TID) | INTRAVENOUS | Status: DC
  Filled 2016-06-22 (×2): qty 1

## 2016-06-22 NOTE — Progress Notes (Signed)
1730 After family changed  their decisions multiple times, Patient finally chose to become comfort care and go to a [facility]. Family refused to take patient home or send him back to [facility] for care.

## 2016-06-22 NOTE — Progress Notes (Signed)
[name] [facility] [place] NAME: [name] [name]    MR#:  [id number]  DATE OF BIRTH:  14-Nov-1941  SUBJECTIVE:  CHIEF COMPLAINT:   Chief Complaint  Patient presents with  . Altered Mental Status    recent admission with pneumonia, sent to NH 4 days ago. Came back with hypotension and somnolence.   Being treated for sepsis and UTI, with Ac renal failure.  renal func and sepsis improved some.   Pt is alert and tolerated oral liquids today.   BP stable - Off neo cenephrine.   Platelets very low- refused for transfusion last night, and again today - want to have transfusion, but after explaination by Dr. Mortimer Fries- agreed to give a second thought. Family also discussing with [facility] Oncologist to decide further plan.  REVIEW OF SYSTEMS:   Pt is alert, but going back to sleep easily, not talking much.  ROS  DRUG ALLERGIES:   Allergies  Allergen Reactions  . Ciprofloxacin Nausea And Vomiting and Other (See Comments)    Reaction:  Shaking   . Codeine Other (See Comments)    Reaction:  Cramping     VITALS:  Blood pressure 88/56, pulse 87, temperature 98.2 F (36.8 C), temperature source Axillary, resp. rate 24, height 6\' 1"  (1.854 m), weight 91.173 kg (201 lb), SpO2 100 %.  PHYSICAL EXAMINATION:   GENERAL: 75 y.o.-year-old patient lying in the bed , no acute distress.  EYES: Pupils equal, round, reactive to light and accommodation. No scleral icterus. Extraocular muscles intact.  HEENT: Head atraumatic, normocephalic. Oropharynx and nasopharynx clear. Lymphadenopathy submandibularly NECK: Supple, no jugular venous distention. No thyroid enlargement, no tenderness.  LUNGS: Normal breath sounds bilaterally in upper lobes, decreased breath sounds bilaterally in the bases, no wheezing, few scattered rales,rhonchi and crepitations at bases.  CARDIOVASCULAR: S1, S2 normal, rhythm is regular, tachycardic. No murmurs, rubs, or gallops.  ABDOMEN: Soft,  nontender, nondistended. Bowel sounds present. No organomegaly or mass.  EXTREMITIES: 3+ lower extremity and pedal edema bilaterally, no cyanosis, or clubbing.  NEUROLOGIC: alert, but drowsy in between, and appears very weak, not much talking, wispers few words- which could not be identified. Moves limbs very little spontaneously. PSYCHIATRIC: The patient is drowsy, not able to check mood or psych status.   Physical Exam LABORATORY PANEL:   CBC  Recent Labs Lab 06/22/16 0654  WBC 7.5  HGB 8.2*  HCT 24.7*  PLT 13*   ------------------------------------------------------------------------------------------------------------------  Chemistries   Recent Labs Lab 06/22/16 0654  NA 135  K 3.5  CL 95*  CO2 29  GLUCOSE 232*  BUN 55*  CREATININE 1.03  CALCIUM 7.4*  AST 46*  ALT 18  ALKPHOS 157*  BILITOT 1.7*   ------------------------------------------------------------------------------------------------------------------  Cardiac Enzymes  Recent Labs Lab 06/19/16 1232  TROPONINI <0.03   ------------------------------------------------------------------------------------------------------------------  RADIOLOGY:  No results found.  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 Septic shock- due  To UTI    on broad-spectrum antibiotic therapy after urinary, blood cultures were taken, negative so far.   On neocenephrine IV drip tapering now, BP more stable now.  Ur cx negative so far. MRSA screen negative- stopped vanc. #2. Acute CHF, start patient on albumin    No lasix due to hypotension.    Echo ordered. Awaiting result. #3. Acute  renal failure,    renal ultrasound, follow creatinine in the morning   Nephro consult appreciated, Family denied for HD.    Renal func improved gradually. #4. Hyponatremia    Likely due to infection and sepsis. Monitor. #5. Hyperkalemia, follow with diuretics, improved. #6. Hypotension, due to sepsis/intravascular depletion due to hypoalbuminemia, initiate Levophed keeping map at 65 and above, improved now, tapered off the pressors. #7. Lactic acidosis, follow lactic acid level closely- improved. #8 symptomatic anemia, and thrombocytopenia transfuse patient with one unit of packed red blood cells, follow CBC, transfuse platelets if needed,  Appreciated oncology inputs.   Dropped again- Pt and family now deciding for getting transfusion vs hospice.   They are discussing with his Primary oncologist at [facility] and palliative care- and will let us know. #9. Bladder mass   Urology consult.  All the records are reviewed and case discussed with Care Management/Social Workerr. Management plans discussed with the patient, family and they are in agreement.  CODE STATUS: DNR  TOTAL TIME TAKING CARE OF THIS PATIENT: 35  minutes.   Extremely poor prognosis, called palliative care consult. Some improvement today. Family to decide further plan- transfusion Vs hospice.  POSSIBLE D/C IN 2-3 DAYS, DEPENDING ON CLINICAL CONDITION.   Vaughan Basta M.D on 06/22/2016   Between 7am to 6pm - Pager - [phone]  After 6pm go to [email or web address] - password [email or web address] [place] [facility]  Office  [phone]  CC: Primary care physician; Coral Spikes, DO  Note: This dictation was prepared with Dragon dictation along with smaller phrase technology. Any transcriptional errors that result from this process are unintentional.

## 2016-06-22 NOTE — Care Management (Signed)
Chart reviewed. Aware of potential comfort care and hospice home. Following progression.

## 2016-06-22 NOTE — Progress Notes (Signed)
Report called to [place], rn on 1C.  Pt is accompanied by son.  Pt will be moved on bed currently in to help with comfort.  Pt is alert.  Pt is moving to room 106.

## 2016-06-22 NOTE — Clinical Social Work Note (Signed)
CSW notified via consult that patient is going to be made comfort care and family has spoken to Palliative Care and has chosen to have patient discharged to hospice home if possible. CSW has notified Santiago Glad with hospice home and she will speak with family regarding the logistics. Currently CSW is being told that there are no hospice beds available at this time. Patient may be transferred to 1C for comfort care. Shela Leff MSW,LCSW [phone]

## 2016-06-22 NOTE — Consult Note (Signed)
PULMONARY / CRITICAL CARE MEDICINE   Name: [name] [name] MRN: [id number] DOB: 08-26-1941    ADMISSION DATE:  06/19/2016 CONSULTATION DATE:  06/19/2016  REFERRING MD:  Dr. Ether Griffins  CHIEF COMPLAINT: Altered mental status  HISTORY OF PRESENT ILLNESS:   Pressors are off, patient refuses to have any further treatment at this time    Review of Systems:  Gen:  Denies  fever, sweats, chills weigh loss   HEENT: Denies blurred vision, double vision, ear pain, eye pain, hearing loss, nose bleeds, sore throat  Cardiac:  No dizziness, chest pain or heaviness, chest tightness,edema  Resp:   Denies cough or sputum porduction, shortness of breath,wheezing, hemoptysis,     Other:  All other systems negative      VITAL SIGNS: BP 88/56 mmHg  Pulse 87  Temp(Src) 98.2 F (36.8 C) (Axillary)  Resp 24  Ht 6\' 1"  (1.854 m)  Wt 201 lb (91.173 kg)  BMI 26.52 kg/m2  SpO2 100%   I/O last 3 completed shifts: In: 2787.9 [P.O.:75; I.V.:2662.9; IV Piggyback:50] Out: 1204 [Urine:1203; Stool:1]  PHYSICAL EXAMINATION: General:  Critically ill appearing male Neuro:  Lethargic follows commands  HEENT:  Supple, no JVD Cardiovascular:  s1s2, rrr, no M/R/G Lungs: rhonchi bilateral lower bases, diminished throughout, even, non labored Abdomen:  Hypoactive BS x4, soft, non tender, non distended  Musculoskeletal:  Normal bulk and tone Skin:  Pale, buttocks reddened, stage II pressure ulcer left heel  LABS:  BMET  Recent Labs Lab 06/20/16 0439 06/21/16 0344 06/22/16 0654  NA 130* 132* 135  K 4.5 3.6 3.5  CL 98* 96* 95*  CO2 20* 28 29  BUN 80* 63* 55*  CREATININE 1.38* 1.10 1.03  GLUCOSE 197* 257* 232*    Electrolytes  Recent Labs Lab 06/20/16 0439 06/21/16 0344 06/22/16 0654  CALCIUM 7.6* 7.4* 7.4*    CBC  Recent Labs Lab 06/20/16 0439 06/21/16 0344 06/22/16 0654  WBC 12.1* 9.4 7.5  HGB 9.4* 7.6* 8.2*  HCT 28.4* 23.2* 24.7*  PLT 16* 13* 13*    Coag's No  results for input(s): APTT, INR in the last 168 hours.  Sepsis Markers  Recent Labs Lab 06/19/16 1232 06/19/16 1547 06/20/16 0439  LATICACIDVEN 5.8* 4.7*  --   PROCALCITON  --   --  1.69    ABG No results for input(s): PHART, PCO2ART, PO2ART in the last 168 hours.  Liver Enzymes  Recent Labs Lab 06/19/16 1232 06/20/16 0439 06/22/16 0654  AST 103* 84* 46*  ALT 32 26 18  ALKPHOS 283* 210* 157*  BILITOT 1.3* 1.6* 1.7*  ALBUMIN 2.0* 2.1* 2.3*    Cardiac Enzymes  Recent Labs Lab 06/19/16 1232  TROPONINI <0.03    Glucose  Recent Labs Lab 06/21/16 1223 06/21/16 1543 06/21/16 2000 06/21/16 2358 06/22/16 0418 06/22/16 0717  GLUCAP 262* 240* 231* 222* 229* 231*    Imaging No results found.   STUDIES:  None  CULTURES: Blood 7/10>> Urine 7/10>>  ANTIBIOTICS: Maxipime 7/10>> Vancomycin 7/10>>7/12  SIGNIFICANT EVENTS: -Pt admitted to ICU on 7/10 due to septic shock secondary to UTI requiring pressors  LINES/TUBES: PIV x3  DISCUSSION: This is a 75 y.o. presented to [facility] on 7/10 on arrival he was hypotensive, hyperkalemia, lactic acidosis, acute renal failure, anasarca, CHF on CXR. PCCM consulted 7/10 due to sepsis secondary to urinalysis revealing pyuria concerning for urinary tract infection and  requiring pressors.  ASSESSMENT / PLAN:  PULMONARY Oxygen as needed  CARDIOVASCULAR A: Hypotension Hx: CAD  and Hyperlipidemia P:  Telemetry monitoring  Neo synephrine as needed to maintain map >65 via peripheral will not place central line due to platelet count of 16 -wean off as tolerated  RENAL A:   Acute renal failure Lactic Acidosis Hyperkalemia Hyponatremia  P:   Trend BMP's Monitor UOP  Nephrology consulted appreciate input Administer albumin and low dose lasix  As per Nephrology- family does NOT want HD  GASTROINTESTINAL A:   No acute issues P:   Advance diet as tolerated   HEMATOLOGIC A:   Anemia Thrombocytopenia Hx:  Metastatic melanoma  P:  Oncology consulted appreciate input PATIENT REFUSED BLOOD PRODUCTS  INFECTIOUS A:   Acute pyelonephritis  ?HCAP P:   Continue abx as listed above Trend WBC and monitor fever curve  ENDOCRINE A:   DM  P:   SSI Monitor CBG's q4hrs  NEUROLOGIC A:   Hx: Benign essential tremor  P:   RASS goal: N/A Avoid sedating medications Frequent reorientation  Lights on during day    FAMILY  - Updates: I updated family yesterday, I have suggested to listen to the request of the patient who stated that he does not want any more treatments. No family at bedside to update today  Recommend home with hospice   prognosis is guarded.  Patient with Multiorgan failure and at high risk for cardiac arrest and death.  Recommend comfort care measures in setting of metastatic melanoma, palliative care following  Sherle Mello Patricia Pesa, M.D.  [facility]  Medical Director [place] Director [facility] Cardio-Pulmonary Department

## 2016-06-22 NOTE — Progress Notes (Signed)
*  PRELIMINARY RESULTS* Echocardiogram 2D Echocardiogram has been performed.  Sherrie Sport 06/22/2016, 3:24 PM

## 2016-06-22 NOTE — Progress Notes (Signed)
(  1015) This NP meet briefly with wife outside room and she shared with me family's desicion to transition to comfort and request for hospice facility.  States "he doesn't want any of this anymore"  We discussed that focus would be comfort, quality and dignity.  All  medical interventions would focus on comfort, no further diagnostic, minimize medication, no artificial feeding or hydration.  She verbalizes understanding.  She request to wait for her daughter to come before putting in request for bed at [facility] facility.  (1100) When returned to room to f/u with daughter, family now requests to speaks with Dr Mortimer Fries regarding change of heart and desire for further life prolonging interventions.  Dr Mortimer Fries notified.  Wadie Lessen NP  Palliative Medicine Team Team Phone # [phone] Pager [phone]

## 2016-06-22 NOTE — Progress Notes (Signed)
Mr. Hee will open his eyes when spoken to. Follows commands. Drowsy most of the night. Bath given. Neosynephrine, on hold since 5 pm. Incontinent of bowel and bladder. Medium BM. Family at bedside.  Pt started on 2L nasal cannula due to pt feeling all SOB.

## 2016-06-22 NOTE — Progress Notes (Signed)
Inpatient Diabetes Program Recommendations  AACE/ADA: New Consensus Statement on Inpatient Glycemic Control (2015)  Target Ranges:  Prepandial:   less than 140 mg/dL      Peak postprandial:   less than 180 mg/dL (1-2 hours)      Critically ill patients:  140 - 180 mg/dL   Lab Results  Component Value Date   GLUCAP 231* 06/22/2016   HGBA1C 6.5* 06/15/2016    Review of Glycemic Control  Results for LOU, SANDBORN (MRN [id number]) as of 06/22/2016 09:46  Ref. Range 06/21/2016 15:43 06/21/2016 20:00 06/21/2016 23:58 06/22/2016 04:18 06/22/2016 07:17  Glucose-Capillary Latest Ref Range: 65-99 mg/dL 240 (H) 231 (H) 222 (H) 229 (H) 231 (H)    Diabetes history: DM2 Outpatient Diabetes medications: Humalog 75/25 20-30 units BID Current orders for Inpatient glycemic control: Novolog 0-9 units q4h, Lantus 14 units qday  Inpatient Diabetes Program Recommendations:  Elevated CBG- increase Lantus to 18 units qday (0.2units/kg) - with current recommendations for palliative care this may not be appropriate.  Gentry Fitz, RN, BA, MHA, CDE Diabetes Coordinator Inpatient Diabetes Program  [phone] (Team Pager) [phone] ([place]) 06/22/2016 9:49 AM

## 2016-06-22 NOTE — Progress Notes (Signed)
Daily Progress Note   Patient Name: [name] [name]       Date: 06/22/2016 DOB: 07-11-1941  Age: 75 y.o. MRN#: [id number] Attending Physician: Vaughan Basta, MD Primary Care Physician: Coral Spikes, DO Admit Date: 06/19/2016  Reason for Consultation/Follow-up:  Establishing GOC and emotional support  Subjective: -family request continued conversation regarding plan of care and disposition options  -continued conversation with Gina/daughter.   Patient and family have made definitive decision regarding focus of care.  [place] is for comfort and dignity, no further diagnostics or life prolonging measures.  Hope is for hospice facility for EOL care  Length of Stay: 3  Current Medications: Scheduled Meds:  . sodium chloride   Intravenous Once  . sodium chloride flush  3 mL Intravenous Q12H  . sodium chloride flush  3 mL Intravenous Q12H    Continuous Infusions:    PRN Meds: sodium chloride, sodium chloride flush  Physical Exam  Constitutional: He appears ill.  HENT:  Mouth/Throat: Oropharynx is clear and moist.  Neurological: He is alert.            Vital Signs: BP 88/56 mmHg  Pulse 87  Temp(Src) 98.2 F (36.8 C) (Axillary)  Resp 24  Ht 6\' 1"  (1.854 m)  Wt 91.173 kg (201 lb)  BMI 26.52 kg/m2  SpO2 100% SpO2: SpO2: 100 % O2 Device: O2 Device: Nasal Cannula O2 Flow Rate: O2 Flow Rate (L/min): 2 L/min  Intake/output summary:  Intake/Output Summary (Last 24 hours) at 06/22/16 1502 Last data filed at 06/22/16 0650  Gross per 24 hour  Intake 1821.02 ml  Output   1200 ml  Net 621.02 ml   LBM: Last BM Date: 06/21/16 Baseline Weight: Weight: 91.173 kg (201 lb) Most recent weight: Weight: 91.173 kg (201 lb)       Palliative Assessment/Data: 20%    Flowsheet Rows          Most Recent Value   Intake Tab    Referral Department  Hospitalist   Unit at Time of Referral  ICU   Palliative Care Primary Diagnosis  Cancer   Date Notified  06/19/16   Palliative Care Type  New Palliative care   Reason for referral  Clarify Goals of Care   Date of Admission  06/19/16   Date first seen by Palliative  Care  06/20/16   # of days Palliative referral response time  1 Day(s)   # of days IP prior to Palliative referral  0   Clinical Assessment    Psychosocial & Spiritual Assessment    Palliative Care Outcomes       Patient Active Problem List   Diagnosis Date Noted  . DNR (do not resuscitate)   . Palliative care encounter   . 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 Anasarca 06/19/2016  . Acute CHF (congestive heart failure) ([place]) 06/19/2016  . ARF (acute renal failure) ([place]) 06/19/2016  . Hypotension 06/19/2016  . Acute pyelonephritis 06/19/2016  . Sepsis ([place]) 06/19/2016  . Hyperkalemia 06/19/2016  . Lactic acidosis 06/19/2016  . Pressure ulcer 06/19/2016  . Pleural effusion, bilateral 06/11/2016  . Symptomatic anemia 06/11/2016  . Hypoxia 06/06/2016  . UTI (lower urinary tract infection) 05/07/2016  . Weakness 05/07/2016  . Melanoma ([place]) 05/07/2016  . Diabetes mellitus ([place]) 05/07/2016  . Metastatic melanoma ([place]) 04/25/2016  . Prostate cancer ([place]) 01/12/2016  . Venous insufficiency of leg 09/23/2015  . CAD (coronary artery disease) 06/30/2015  . Essential hypertension 06/30/2015  . Essential tremor 06/30/2015  . Preventative health care 06/30/2015  . Carotid artery disease ([place]) 10/02/2014  . Hyperlipidemia   . Type 2 diabetes mellitus with complications [facility]     Palliative Care Assessment & Plan    Assessment: 75 y.o. male 06/19/2016 with known history of Metastatic melanoma,(Recently diagnosed with metastatic melanoma B-raf/negative with diffuse metastatic disease ; including possibly to the bone marrow. Patient is  currently being treated At [facility] with combination immunotherapy who was recently admitted with pneumonia diagnosis and was discharged on 06/15/2016 to [place] facility comes back from the facility with hypotension and altered mental status, worsening somnolence. On arrival to the hospital patient was noted to have hypotension, hyperkalemia, lactic acidosis, acute renal failure, anasarca, CHF on chest x-ray. Urinalysis revealed pyuria, concerning for urinary tract infection  Acute Renal Failure, Hyperkalemia, Metabolic Acidosis, Hyponatremia, Anemia/thrombocytopenia, Urinary Tract Infection  It has been difficult for patient and family grasping concepts of mortality and limitations of life prolonging medical interventions, but today they have made the shift to a full comfort path.   Recommendations/Plan:  Transition to hospice facility for EOL care    If no beds at facility move to comfort bed on 1C until transfer is available.  No further diagnostics, artificial hydration or transfusions.   Symptom management    Code Status:    Code Status Orders        Start     Ordered   06/19/16 1656  Do not attempt resuscitation (DNR)   Continuous    Question Answer Comment  In the event of cardiac or respiratory ARREST Do not call a "code blue"   In the event of cardiac or respiratory ARREST Do not perform Intubation, CPR, defibrillation or ACLS   In the event of cardiac or respiratory ARREST Use medication by any route, position, wound care, and other measures to relive pain and suffering. May use oxygen, suction and manual treatment of airway obstruction as needed for comfort.      06/19/16 1657    Code Status History    Date Active Date Inactive Code Status Order ID Comments User Context   06/19/2016  3:43 PM 06/19/2016  4:54 PM Full Code [id number]  Theodoro Grist, MD Inpatient   06/19/2016  2:39 PM 06/19/2016  3:43 PM Partial Code [id number]  Theodoro Grist, MD ED   06/11/2016   5:32 PM 06/15/2016  5:52 PM Full Code [id number]  Idelle Crouch, MD Inpatient   06/06/2016  6:11 PM 06/08/2016  5:51 PM Full Code [id number]  Hillary Bow, MD ED   05/08/2016  1:03 AM 05/10/2016 10:53 AM Full Code [id number]  Quintella Baton, MD Inpatient    Advance Directive Documentation        Most Recent Value   Type of Advance Directive  Healthcare Power of Attorney   Pre-existing out of facility DNR order (yellow form or pink MOST form)     "MOST" Form in Place?         Prognosis:  Likely less than one week  Discharge Planning:  Hospice facility, will write for choice   Care plan was discussed with Dr Mortimer Fries and RN at bedside  Thank you for allowing the Palliative Medicine Team to assist in the care of this patient.   Time In: 1425 Time Out:  1500 Total Time 35 min Prolonged Time Billed  no      Greater than 50%  of this time was spent counseling and coordinating care related to the above assessment and plan.  Wadie Lessen, NP  Please contact Palliative Medicine Team phone at [phone] for questions and concerns.

## 2016-06-22 NOTE — Progress Notes (Signed)
ST received orders, chart reviewed and nursing interviewed. Nursing states that pt is consuming thin liquids and items like yogurt without s/s of aspiration. Nursing informs that pt will be transitioning to Hospice services and a Bedside Swallow Evaluation is no longer indicated. Orders are discontinued.

## 2016-06-23 MED ORDER — LORAZEPAM 2 MG/ML IJ SOLN
0.5000 mg | Freq: Once | INTRAMUSCULAR | Status: DC
  Filled 2016-06-23: qty 1

## 2016-06-23 MED ORDER — ALPRAZOLAM 0.25 MG PO TABS
0.2500 mg | ORAL_TABLET | Freq: Three times a day (TID) | ORAL | Status: DC | PRN
  Administered 2016-06-23: 0.25 mg via ORAL
  Filled 2016-06-23: qty 1

## 2016-06-23 MED ORDER — MORPHINE SULFATE (CONCENTRATE) 10 MG/0.5ML PO SOLN: 2.5000 mg | ORAL | Status: AC | PRN

## 2016-06-23 MED ORDER — ALPRAZOLAM 0.25 MG PO TABS: 0.2500 mg | ORAL_TABLET | Freq: Three times a day (TID) | ORAL | Status: AC | PRN

## 2016-06-23 MED ORDER — MORPHINE SULFATE (CONCENTRATE) 10 MG/0.5ML PO SOLN
2.5000 mg | ORAL | Status: DC | PRN
  Administered 2016-06-23: 2.6 mg via ORAL
  Filled 2016-06-23: qty 1

## 2016-06-23 NOTE — Progress Notes (Signed)
Patient discharge to Hospice with EMS in stretcher

## 2016-06-23 NOTE — Progress Notes (Signed)
Nutrition Brief Note  Chart reviewed. Pt now transitioning to comfort care with discharge orders placed with Hospice. Pt continues on Soft diet order. No further nutrition interventions warranted at this time.  Please re-consult as needed.   Dwyane Luo, RD, LDN Pager [phone] Weekend/On-Call Pager [phone]

## 2016-06-23 NOTE — Progress Notes (Signed)
[place] referral received from [place] following a Palliative Medicine consult. [name] [name] is a 75 year old man with a known history of metastatic melanoma admitted to [facility] on 7/10 for evaluation of sepsis and suspected UTI. He was previously hospitalized from 6/27-7/2 for pneumonia and discharged to [facility] for rehab. He has continued to decline in functional status, with poor oral intake. He has required blood pressure support for continued low BP. He and his family have met with both Oncology and Palliative Medicine several times over the course of this hospitalization and have decided to focus on his comfort with transfer to the hospice home. Writer met in the family room with [name] [name] and daughter [name] [name] to initiate education regarding hospice services, philosophy and team approach to care with good understanding voiced. Questions answered, consents signed. Writer also discussed at length symptom management at the hospice home. Family reports patient has been having pain, but has only been taking Aleve, per chart review patient has not had any Aleve this admission. [name] [name] stated that she thinks her father is "waiting to get to the hospice home" and will them want his pain managed. Writer provided education regarding use of low dose liquid morphine, both [name] and [name] both voiced understanding. Report called to the hospice home, EMS notified for transport. Discharge summary faxed to hospice referral. Thank you for the opportunity to be involved in the care of this patient and his family. Flo Shanks RN, BSN, [place] and [facility] of [place], [facility] [phone] c

## 2016-06-23 NOTE — Discharge Summary (Signed)
[facility] NAME: [name] [name]    MR#:  [id number]  DATE OF BIRTH:  1941/10/19  DATE OF ADMISSION:  06/19/2016 ADMITTING PHYSICIAN: Theodoro Grist, MD  DATE OF DISCHARGE: 06/23/2016  PRIMARY CARE PHYSICIAN: Coral Spikes, DO    ADMISSION DIAGNOSIS:  ARF (acute renal failure) (HCC) [N17.9] Sepsis, due to unspecified organism ([place]) [A41.9]  DISCHARGE DIAGNOSIS:  Principal Problem:   Sepsis ([place]) Active Problems:   Anasarca   Acute CHF (congestive heart failure) (HCC)   ARF (acute renal failure) (HCC)   Hypotension   Acute pyelonephritis   Hyperkalemia   Lactic acidosis   Pressure ulcer   DNR (do not resuscitate)   Palliative care encounter   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 Insulin dependent diabetes mellitus ([place])   . Arteriosclerotic coronary artery disease   . Peyronie's disease   . Prostatitis   . Benign essential tremor   . Hyperlipidemia   . Bladder diverticulum   . Melanoma ([place])   . 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    HOSPITAL COURSE:   #1. Septic shock- due To UTI   on broad-spectrum antibiotic therapy after urinary, blood cultures were taken, negative so far.  On neocenephrine IV drip tapering now, BP more stable now.  Ur cx negative so far. MRSA screen negative- stopped vanc. #2. Acute CHF, start patient on albumin  No lasix due to hypotension.  Echo ordered.  #3. Acute renal failure,   renal ultrasound, follow creatinine in the morning  Nephro consult appreciated, Family denied for HD.  Renal func improved gradually. #4. Hyponatremia   Likely due to infection and sepsis. Monitor. #5. Hyperkalemia, follow with diuretics, improved. #6. Hypotension, due to  sepsis/intravascular depletion due to hypoalbuminemia, initiate Levophed keeping map at 65 and above, improved now, tapered off the pressors. #7. Lactic acidosis, follow lactic acid level closely- improved. #8 symptomatic anemia, and thrombocytopenia transfuse patient with one unit of packed red blood cells, follow CBC, transfuse platelets if needed, Appreciated oncology inputs.  Dropped again- Pt and family now deciding for getting hospice.  Family and pt agreed on hospice and comfort care, which is arranged and being transferred to hospice home.  DISCHARGE CONDITIONS:  Stable.  CONSULTS OBTAINED:  Treatment Team:  Lavonia Dana, MD Cammie Sickle, MD  DRUG ALLERGIES:   Allergies  Allergen Reactions  . Ciprofloxacin Nausea And Vomiting and Other (See Comments)    Reaction:  Shaking   . Codeine Other (See Comments)    Reaction:  Cramping     DISCHARGE MEDICATIONS:   Current Discharge Medication List    START taking these medications   Details  ALPRAZolam (XANAX) 0.25 MG tablet Take 1 tablet (0.25 mg total) by mouth 3 (three) times daily as needed for anxiety. Qty: 30 tablet, Refills: 0    Morphine Sulfate (MORPHINE CONCENTRATE) 10 MG/0.5ML SOLN concentrated solution Take 0.13 mLs (2.6 mg total) by mouth every 2 (two) hours as needed for moderate pain or severe pain (anxiety). Qty: 180 mL, Refills: 0      STOP taking these medications     azithromycin (ZITHROMAX) 250 MG tablet      cholecalciferol (VITAMIN D) 1000 UNITS tablet  docusate sodium (COLACE) 100 MG capsule      furosemide (LASIX) 20 MG tablet      insulin lispro protamine-lispro (HUMALOG MIX 75/25) (75-25) 100 UNIT/ML SUSP injection      nitrofurantoin, macrocrystal-monohydrate, (MACROBID) 100 MG capsule      polyethylene glycol (MIRALAX / GLYCOLAX) packet      primidone (MYSOLINE) 50 MG tablet      tamsulosin (FLOMAX) 0.4 MG CAPS capsule      Turmeric 500 MG CAPS      vitamin B-12  (CYANOCOBALAMIN) 1000 MCG tablet          DISCHARGE INSTRUCTIONS:   Follow as needed.  If you experience worsening of your admission symptoms, develop shortness of breath, life threatening emergency, suicidal or homicidal thoughts you must seek medical attention immediately by calling 911 or calling your MD immediately  if symptoms less severe.  You Must read complete instructions/literature along with all the possible adverse reactions/side effects for all the Medicines you take and that have been prescribed to you. Take any new Medicines after you have completely understood and accept all the possible adverse reactions/side effects.   Please note  You were cared for by a hospitalist during your hospital stay. If you have any questions about your discharge medications or the care you received while you were in the hospital after you are discharged, you can call the unit and asked to speak with the hospitalist on call if the hospitalist that took care of you is not available. Once you are discharged, your primary care physician will handle any further medical issues. Please note that NO REFILLS for any discharge medications will be authorized once you are discharged, as it is imperative that you return to your primary care physician (or establish a relationship with a primary care physician if you do not have one) for your aftercare needs so that they can reassess your need for medications and monitor your lab values.    Today   CHIEF COMPLAINT:   Chief Complaint  Patient presents with  . Altered Mental Status    HISTORY OF PRESENT ILLNESS:  [name] [name]  is a 75 y.o. male with a known history of Metastatic melanoma, who was recently admitted with pneumonia diagnosis and was discharged on 06/15/2016 to [place] facility comes back from the facility with hypotension and altered mental status, worsening somnolence. Patient himself denies any pain or significant shortness of breath,  however, not able to review of systems due to his somnolence. On arrival to the hospital patient was noted to have hypotension, hyperkalemia, lactic acidosis, acute renal failure, anasarca, CHF on chest x-ray. Hospitalist services were contacted for admission had urinalysis revealed pyuria, concerning for urinary tract infection   VITAL SIGNS:  Blood pressure 101/50, pulse 97, temperature 97.7 F (36.5 C), temperature source Axillary, resp. rate 16, height 6\' 1"  (1.854 m), weight 91.173 kg (201 lb), SpO2 100 %.  I/O:   Intake/Output Summary (Last 24 hours) at 06/23/16 1230 Last data filed at 06/23/16 0930  Gross per 24 hour  Intake      0 ml  Output    300 ml  Net   -300 ml    PHYSICAL EXAMINATION:   GENERAL: 75 y.o.-year-old patient lying in the bed , no acute distress.  EYES: Pupils equal, round, reactive to light and accommodation. No scleral icterus. Extraocular muscles intact.  HEENT: Head atraumatic, normocephalic. Oropharynx and nasopharynx clear. Lymphadenopathy submandibularly NECK: Supple, no jugular venous distention. No thyroid  enlargement, no tenderness.  LUNGS: Normal breath sounds bilaterally in upper lobes, decreased breath sounds bilaterally in the bases, no wheezing, few scattered rales,rhonchi and crepitations at bases.  CARDIOVASCULAR: S1, S2 normal, rhythm is regular, tachycardic. No murmurs, rubs, or gallops.  ABDOMEN: Soft, nontender, nondistended. Bowel sounds present. No organomegaly or mass.  EXTREMITIES: 3+ lower extremity and pedal edema bilaterally, no cyanosis, or clubbing.  NEUROLOGIC: alert, but drowsy in between, and appears very weak, not much talking, wispers few words- which could not be identified. Moves limbs very little spontaneously. PSYCHIATRIC: The patient is drowsy, not able to check mood or psych status.   DATA REVIEW:   CBC  Recent Labs Lab 06/22/16 0654  WBC 7.5  HGB 8.2*  HCT 24.7*  PLT 13*    Chemistries   Recent  Labs Lab 06/22/16 0654  NA 135  K 3.5  CL 95*  CO2 29  GLUCOSE 232*  BUN 55*  CREATININE 1.03  CALCIUM 7.4*  AST 46*  ALT 18  ALKPHOS 157*  BILITOT 1.7*    Cardiac Enzymes  Recent Labs Lab 06/19/16 1232  TROPONINI <0.03    Microbiology Results  Results for orders placed or performed during the hospital encounter of 06/19/16  Blood Culture (routine x 2)     Status: None (Preliminary result)   Collection Time: 06/19/16 12:31 PM  Result Value Ref Range Status   Specimen Description BLOOD RIGHT ARM  Final   Special Requests BOTTLES DRAWN AEROBIC AND ANAEROBIC 5CC  Final   Culture NO GROWTH 4 DAYS  Final   Report Status PENDING  Incomplete  Blood Culture (routine x 2)     Status: None (Preliminary result)   Collection Time: 06/19/16 12:33 PM  Result Value Ref Range Status   Specimen Description BLOOD LEFT ARM  Final   Special Requests BOTTLES DRAWN AEROBIC AND ANAEROBIC 5CC  Final   Culture NO GROWTH 4 DAYS  Final   Report Status PENDING  Incomplete  Urine culture     Status: None   Collection Time: 06/19/16 12:43 PM  Result Value Ref Range Status   Specimen Description URINE, RANDOM  Final   Special Requests NONE  Final   Culture NO GROWTH Performed at [facility]   Final   Report Status 06/20/2016 FINAL  Final    RADIOLOGY:  No results found.  EKG:   Orders placed or performed during the hospital encounter of 06/19/16  . ED EKG 12-Lead  . ED EKG 12-Lead  . EKG 12-Lead  . EKG 12-Lead  . EKG 12-Lead  . EKG 12-Lead  . EKG 12-Lead  . EKG 12-Lead      Management plans discussed with the patient, family and they are in agreement.  CODE STATUS: DNR    Code Status Orders        Start     Ordered   06/19/16 1656  Do not attempt resuscitation (DNR)   Continuous    Question Answer Comment  In the event of cardiac or respiratory ARREST Do not call a "code blue"   In the event of cardiac or respiratory ARREST Do not perform Intubation, CPR,  defibrillation or ACLS   In the event of cardiac or respiratory ARREST Use medication by any route, position, wound care, and other measures to relive pain and suffering. May use oxygen, suction and manual treatment of airway obstruction as needed for comfort.      06/19/16 1657    Code Status History  Date Active Date Inactive Code Status Order ID Comments User Context   06/19/2016  3:43 PM 06/19/2016  4:54 PM Full Code [id number]  Theodoro Grist, MD Inpatient   06/19/2016  2:39 PM 06/19/2016  3:43 PM Partial Code [id number]  Theodoro Grist, MD ED   06/11/2016  5:32 PM 06/15/2016  5:52 PM Full Code [id number]  Idelle Crouch, MD Inpatient   06/06/2016  6:11 PM 06/08/2016  5:51 PM Full Code [id number]  Hillary Bow, MD ED   05/08/2016  1:03 AM 05/10/2016 10:53 AM Full Code [id number]  Quintella Baton, MD Inpatient    Advance Directive Documentation        Most Recent Value   Type of Advance Directive  Healthcare Power of Attorney   Pre-existing out of facility DNR order (yellow form or pink MOST form)     "MOST" Form in Place?        TOTAL TIME TAKING CARE OF THIS PATIENT: 35 minutes.    Vaughan Basta M.D on 06/23/2016 at 12:30 PM  Between 7am to 6pm - Pager - [phone]  After 6pm go to [email or web address] - password [email or web address] [facility]  Office  [phone]  CC: Primary care physician; Coral Spikes, DO   Note: This dictation was prepared with Dragon dictation along with smaller phrase technology. Any transcriptional errors that result from this process are unintentional.

## 2016-06-23 NOTE — Care Management Important Message (Signed)
Important Message  Patient Details  Name: [name] [name] MRN: [id number] Date of Birth: 05/08/1941   Medicare Important Message Given:  Yes    [name] 06/23/2016, 11:23 AM

## 2016-06-24 LAB — CULTURE, BLOOD (ROUTINE X 2)
CULTURE: NO GROWTH
Culture: NO GROWTH

## 2016-06-26 ENCOUNTER — Telehealth: Payer: Self-pay | Admitting: Family Medicine

## 2016-06-26 NOTE — Telephone Encounter (Signed)
Leann [phone] called from [place] called regarding wanting to inform that pt passed away on Jul 05, 2016 @ 8:43am. Thank you!

## 2016-06-26 NOTE — Telephone Encounter (Signed)
Please advise, thanks.

## 2016-06-29 ENCOUNTER — Inpatient Hospital Stay: Payer: BLUE CROSS/BLUE SHIELD | Admitting: Internal Medicine

## 2016-07-11 DEATH — deceased

## 2017-08-11 IMAGING — CT CT ANGIO CHEST
2 of 6 series · 18 of 46 positions shown · IV contrast (isovue)
Comparison: CT scan of the chest January 11, 2016 and chest x-ray
from today

CLINICAL DATA: Coughing and shortness of breath. Evaluate for
pulmonary embolus.

EXAM:
CT ANGIOGRAPHY CHEST WITH CONTRAST
TECHNIQUE: Multidetector CT imaging of the chest was performed using the
standard protocol during bolus administration of intravenous
contrast. Multiplanar CT image reconstructions and MIPs were
obtained to evaluate the vascular anatomy.
CONTRAST:  100 mL of Isovue 370

[Series 5: pe thins 1.5 · axial · 0.68mm/px · z∈[-676,-424]mm · 15 of 236 slices shown]
[im 13/236  lung]
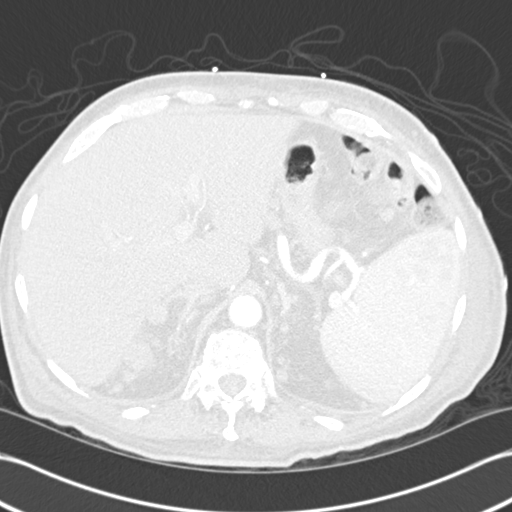
[im 25/236  soft-tissue]
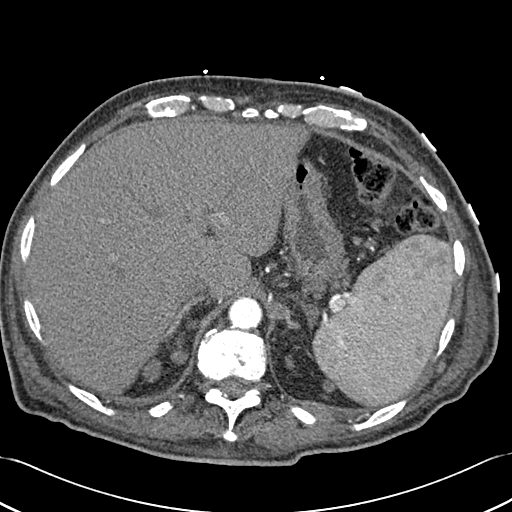
[im 50/236  lung]
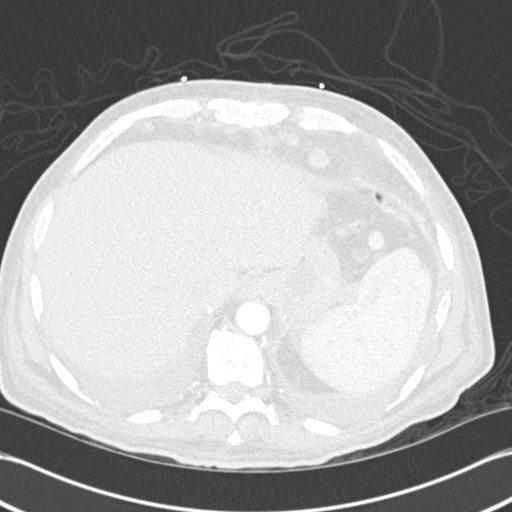
[im 62/236  soft-tissue]
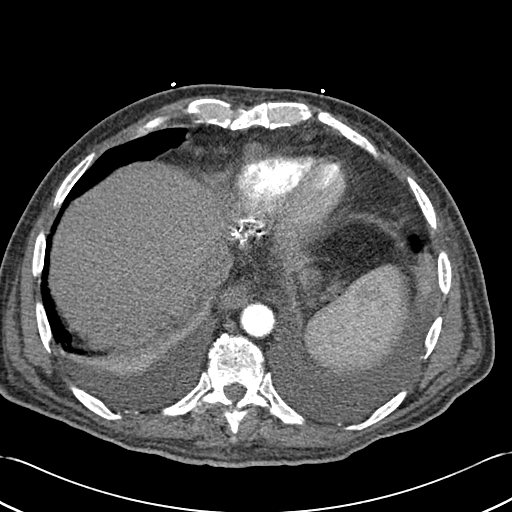
[im 75/236  lung]
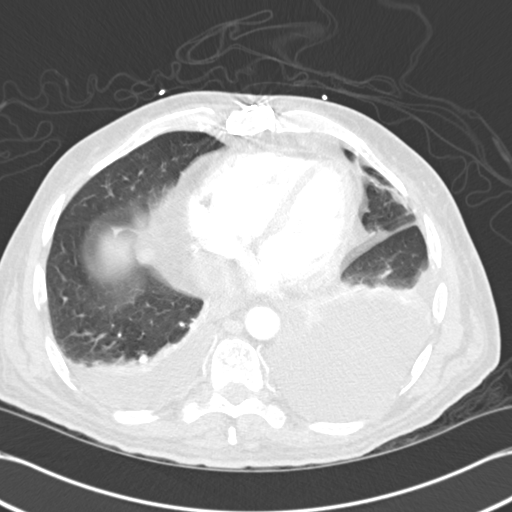
[im 87/236  soft-tissue]
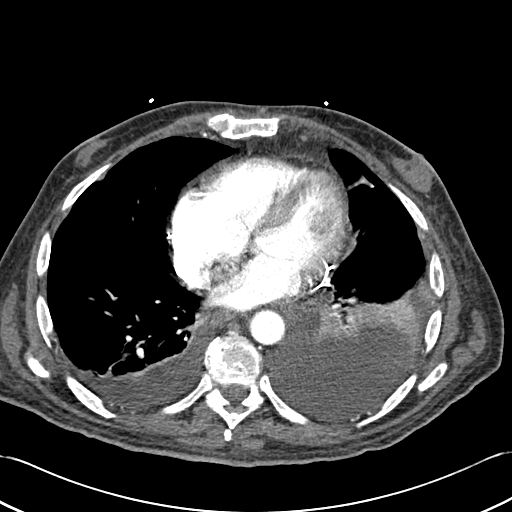
[im 99/236  lung]
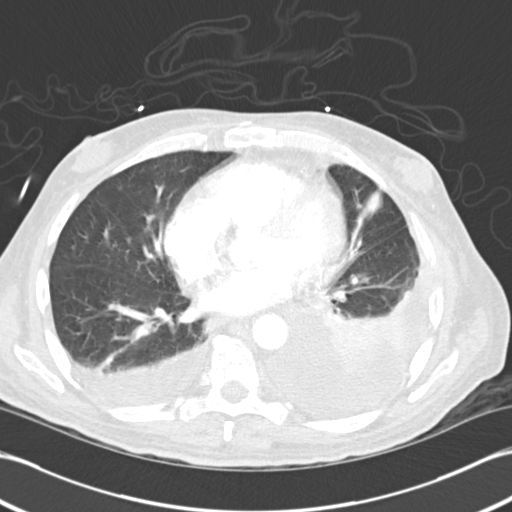
[im 124/236  soft-tissue]
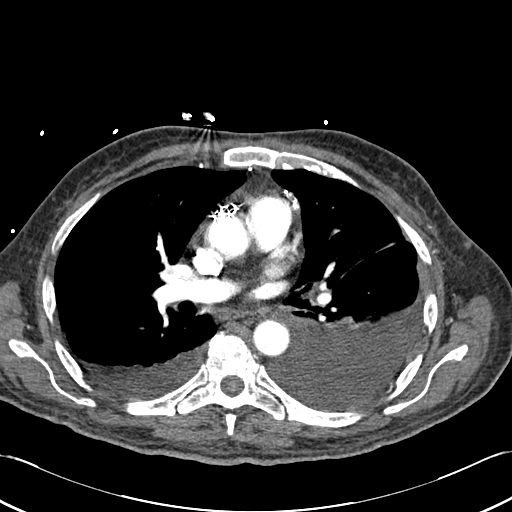
[im 137/236  lung]
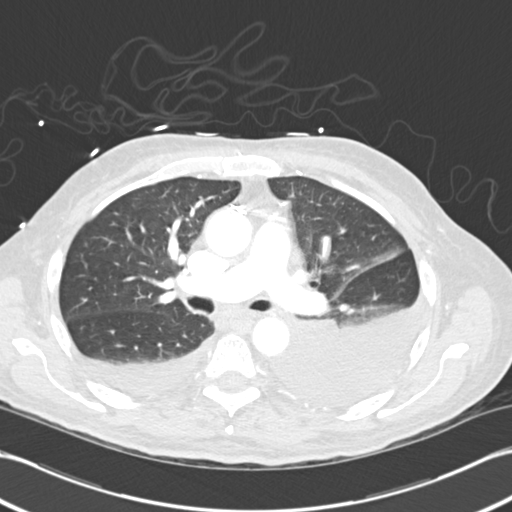
[im 149/236  soft-tissue]
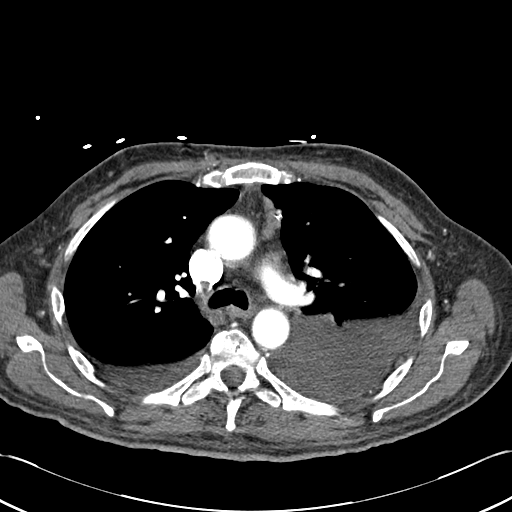
[im 161/236  lung]
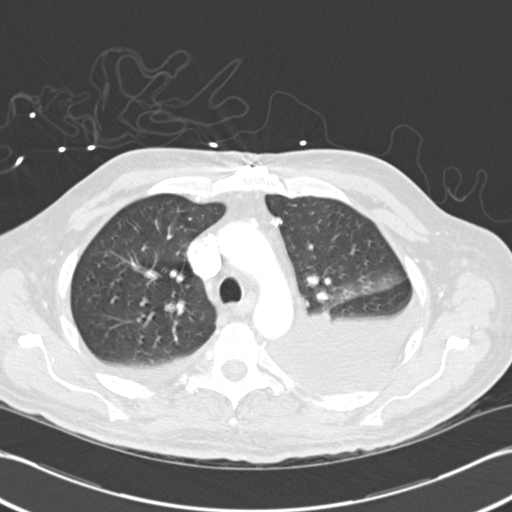
[im 174/236  soft-tissue]
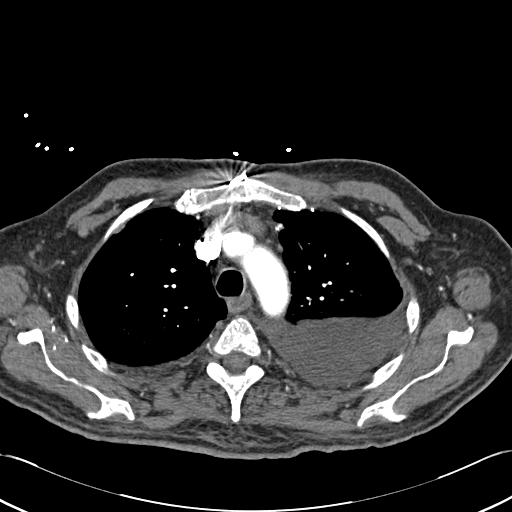
[im 198/236  lung]
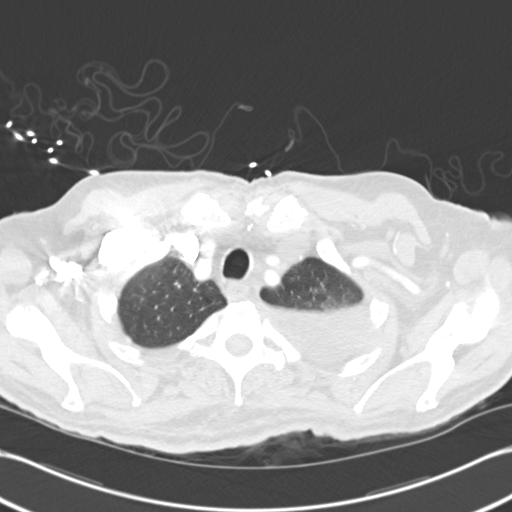
[im 211/236  soft-tissue]
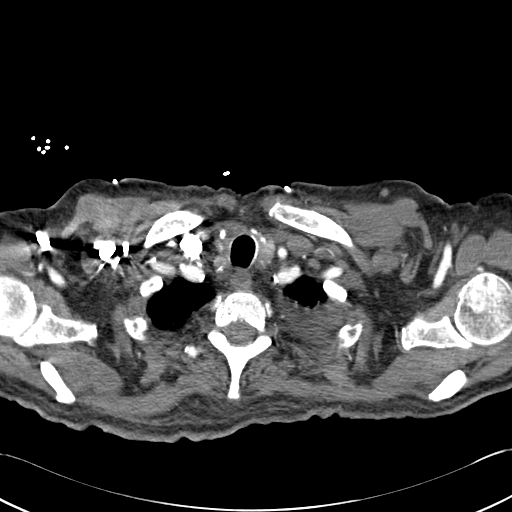
[im 223/236  lung]
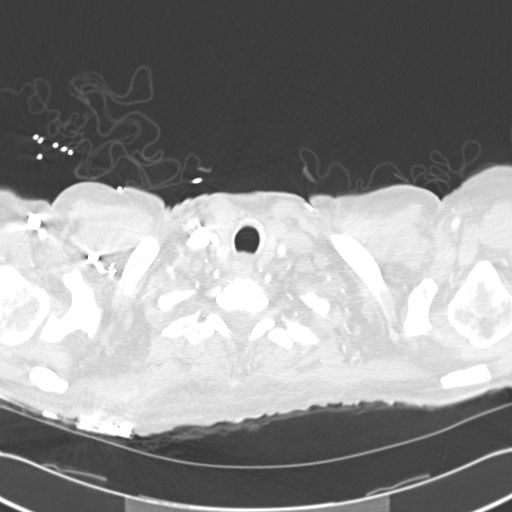

[Series 7: cor mpr 2.0 · coronal · 0.60mm/px · 3 of 129 slices shown]
[im 33/129  soft-tissue]
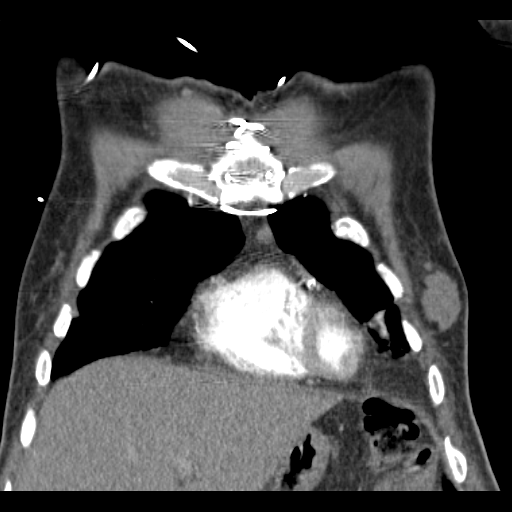
[im 65/129  soft-tissue]
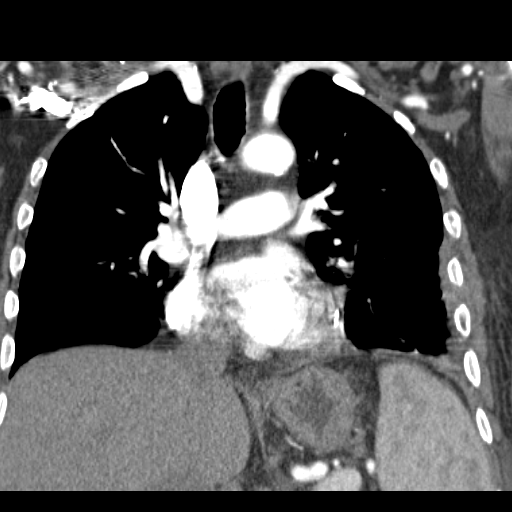
[im 97/129  soft-tissue]
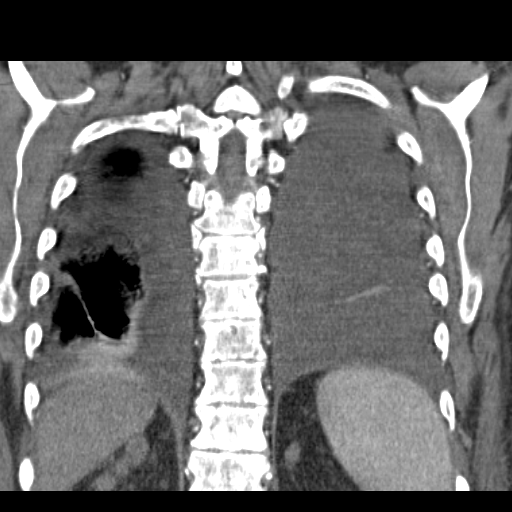

[18 of 46 positions shown; findings below may reference images not displayed]

FINDINGS: The central airways are normal. No pneumothorax. There is a small
right pleural effusion and moderate size left effusion, both with
associated compressive atelectasis. No pulmonary nodules, masses, or
other focal infiltrates. Evaluation for pulmonary emboli is limited
in the lower lobes due to compressive atelectasis and effusions.
Within this limitation, no pulmonary emboli are identified.

There are coronary artery calcifications. The thoracic aorta is non
aneurysmal with no dissection. The heart size is unchanged. No
pericardial effusion. There is adenopathy in the chest and abdomen.
The adenopathy is diffuse. For example, there is a node just deep to
the left pectoralis muscle on series 4, image 14 measuring 18 x 18
mm. Nodes in the left axilla and elsewhere in the left retro
pectoral region are prominent but not enlarged by CT criteria. There
is also a node at the midline of the base of neck on series 4, image
6 which is larger in the interval. Enlarging prevascular nodes are
seen. Numerous newly enlarged nodes are seen in the epicardial fat
best seen on series 4, images 112-115. A representative node on
image 113 to the left of midline measures 13 by 16 mm. Numerous
enlarged nodes are seen in the upper abdomen as well. A
representative node in the fat superior to the right kidney on
series 4, image 136 measures 23 by 19 mm. Visualized portions of the
adrenal glands, abdominal aorta, stomach, and liver are normal. The
spleen is enlarged and heterogeneous but only partially visualized.

Increased attenuation is seen in the subcutaneous fat diffusely
suggesting volume overload/ edema. Bilateral gynecomastia is noted.

There is increased patchy attenuation in the bones, best seen in the
spine. The ribs and inferior sternum appear to be involved as well.

Review of the MIP images confirms the above findings.
IMPRESSION: 1. No pulmonary emboli are identified.
2. Diffuse adenopathy in the chest and upper abdomen in addition to
scattered sclerosis throughout the visualized bones is most
consistent with metastatic disease. The patient has a history of
melanoma and prostate cancer.
3. Bilateral pleural effusions and atelectasis.
4. The spleen remains enlarged and is heterogeneous. The
heterogeneity may be at least partially due to see arterial phase
imaging.
